# Patient Record
Sex: Female | Born: 1945
Health system: Southern US, Community
[De-identification: ages and names within clinical notes are randomized; demographics above are authoritative.]

## PROBLEM LIST (undated history)

## (undated) DIAGNOSIS — I639 Cerebral infarction, unspecified: Secondary | ICD-10-CM

## (undated) DIAGNOSIS — G4733 Obstructive sleep apnea (adult) (pediatric): Secondary | ICD-10-CM

## (undated) DIAGNOSIS — F32A Depression, unspecified: Secondary | ICD-10-CM

## (undated) DIAGNOSIS — M1990 Unspecified osteoarthritis, unspecified site: Secondary | ICD-10-CM

## (undated) DIAGNOSIS — I509 Heart failure, unspecified: Secondary | ICD-10-CM

## (undated) DIAGNOSIS — I10 Essential (primary) hypertension: Secondary | ICD-10-CM

## (undated) DIAGNOSIS — Z8719 Personal history of other diseases of the digestive system: Secondary | ICD-10-CM

## (undated) DIAGNOSIS — R51 Headache: Secondary | ICD-10-CM

## (undated) DIAGNOSIS — C801 Malignant (primary) neoplasm, unspecified: Secondary | ICD-10-CM

## (undated) DIAGNOSIS — N189 Chronic kidney disease, unspecified: Secondary | ICD-10-CM

## (undated) DIAGNOSIS — E039 Hypothyroidism, unspecified: Secondary | ICD-10-CM

## (undated) DIAGNOSIS — M549 Dorsalgia, unspecified: Secondary | ICD-10-CM

## (undated) DIAGNOSIS — I219 Acute myocardial infarction, unspecified: Secondary | ICD-10-CM

## (undated) DIAGNOSIS — E119 Type 2 diabetes mellitus without complications: Secondary | ICD-10-CM

## (undated) DIAGNOSIS — F419 Anxiety disorder, unspecified: Secondary | ICD-10-CM

## (undated) DIAGNOSIS — K219 Gastro-esophageal reflux disease without esophagitis: Secondary | ICD-10-CM

## (undated) DIAGNOSIS — I2510 Atherosclerotic heart disease of native coronary artery without angina pectoris: Secondary | ICD-10-CM

## (undated) DIAGNOSIS — F329 Major depressive disorder, single episode, unspecified: Secondary | ICD-10-CM

## (undated) HISTORY — DX: Unspecified osteoarthritis, unspecified site: M19.90

## (undated) HISTORY — DX: Atherosclerotic heart disease of native coronary artery without angina pectoris: I25.10

## (undated) HISTORY — PX: UVULOPALATOPHARYNGOPLASTY: SHX827

## (undated) HISTORY — PX: CARPAL TUNNEL RELEASE: SHX101

## (undated) HISTORY — PX: JOINT REPLACEMENT: SHX530

## (undated) HISTORY — PX: REPLACEMENT TOTAL KNEE: SUR1224

## (undated) HISTORY — PX: CERVICAL DISC SURGERY: SHX588

## (undated) HISTORY — DX: Dorsalgia, unspecified: M54.9

## (undated) HISTORY — PX: SALPINGOOPHORECTOMY: SHX82

## (undated) HISTORY — DX: Obstructive sleep apnea (adult) (pediatric): G47.33

## (undated) HISTORY — PX: TONSILLECTOMY: SUR1361

## (undated) HISTORY — DX: Essential (primary) hypertension: I10

## (undated) HISTORY — DX: Hypothyroidism, unspecified: E03.9

## (undated) HISTORY — PX: ABDOMINAL HYSTERECTOMY: SHX81

## (undated) HISTORY — DX: Type 2 diabetes mellitus without complications: E11.9

---

## 1997-09-15 ENCOUNTER — Other Ambulatory Visit: Admission: RE | Admit: 1997-09-15 | Discharge: 1997-09-15 | Payer: Self-pay

## 1998-06-23 ENCOUNTER — Other Ambulatory Visit: Admission: RE | Admit: 1998-06-23 | Discharge: 1998-06-23 | Payer: Self-pay | Admitting: Gynecology

## 2000-05-02 ENCOUNTER — Encounter: Payer: Self-pay | Admitting: Specialist

## 2000-05-08 ENCOUNTER — Inpatient Hospital Stay (HOSPITAL_COMMUNITY): Admission: EM | Admit: 2000-05-08 | Discharge: 2000-05-09 | Payer: Self-pay | Admitting: Specialist

## 2000-07-24 ENCOUNTER — Other Ambulatory Visit: Admission: RE | Admit: 2000-07-24 | Discharge: 2000-07-24 | Payer: Self-pay | Admitting: Gynecology

## 2000-07-31 ENCOUNTER — Encounter: Payer: Self-pay | Admitting: Gynecology

## 2000-07-31 ENCOUNTER — Encounter: Admission: RE | Admit: 2000-07-31 | Discharge: 2000-07-31 | Payer: Self-pay | Admitting: Gynecology

## 2000-08-02 ENCOUNTER — Encounter: Admission: RE | Admit: 2000-08-02 | Discharge: 2000-08-02 | Payer: Self-pay | Admitting: Gynecology

## 2000-08-02 ENCOUNTER — Encounter: Payer: Self-pay | Admitting: Gynecology

## 2001-08-13 ENCOUNTER — Encounter: Admission: RE | Admit: 2001-08-13 | Discharge: 2001-08-13 | Payer: Self-pay | Admitting: Gynecology

## 2001-08-13 ENCOUNTER — Encounter: Payer: Self-pay | Admitting: Gynecology

## 2001-08-14 ENCOUNTER — Other Ambulatory Visit: Admission: RE | Admit: 2001-08-14 | Discharge: 2001-08-14 | Payer: Self-pay | Admitting: Gynecology

## 2001-10-16 ENCOUNTER — Inpatient Hospital Stay (HOSPITAL_COMMUNITY): Admission: AD | Admit: 2001-10-16 | Discharge: 2001-10-19 | Payer: Self-pay | Admitting: Cardiology

## 2001-10-17 ENCOUNTER — Encounter: Payer: Self-pay | Admitting: Cardiology

## 2002-05-06 ENCOUNTER — Ambulatory Visit (HOSPITAL_COMMUNITY): Admission: RE | Admit: 2002-05-06 | Discharge: 2002-05-06 | Payer: Self-pay | Admitting: Pulmonary Disease

## 2002-08-20 ENCOUNTER — Other Ambulatory Visit: Admission: RE | Admit: 2002-08-20 | Discharge: 2002-08-20 | Payer: Self-pay | Admitting: Gynecology

## 2003-05-07 ENCOUNTER — Inpatient Hospital Stay (HOSPITAL_COMMUNITY): Admission: RE | Admit: 2003-05-07 | Discharge: 2003-05-09 | Payer: Self-pay | Admitting: Neurosurgery

## 2003-08-30 ENCOUNTER — Other Ambulatory Visit: Admission: RE | Admit: 2003-08-30 | Discharge: 2003-08-30 | Payer: Self-pay | Admitting: Gynecology

## 2003-09-24 ENCOUNTER — Inpatient Hospital Stay (HOSPITAL_COMMUNITY): Admission: RE | Admit: 2003-09-24 | Discharge: 2003-09-29 | Payer: Self-pay | Admitting: Specialist

## 2003-11-11 ENCOUNTER — Ambulatory Visit (HOSPITAL_COMMUNITY): Admission: RE | Admit: 2003-11-11 | Discharge: 2003-11-11 | Payer: Self-pay | Admitting: Cardiology

## 2003-12-02 ENCOUNTER — Ambulatory Visit (HOSPITAL_COMMUNITY): Admission: RE | Admit: 2003-12-02 | Discharge: 2003-12-02 | Payer: Self-pay | Admitting: Cardiology

## 2004-01-25 ENCOUNTER — Observation Stay (HOSPITAL_COMMUNITY): Admission: RE | Admit: 2004-01-25 | Discharge: 2004-01-26 | Payer: Self-pay | Admitting: Neurosurgery

## 2004-02-11 ENCOUNTER — Observation Stay (HOSPITAL_COMMUNITY): Admission: RE | Admit: 2004-02-11 | Discharge: 2004-02-12 | Payer: Self-pay | Admitting: Neurosurgery

## 2004-04-05 ENCOUNTER — Encounter (HOSPITAL_COMMUNITY): Admission: RE | Admit: 2004-04-05 | Discharge: 2004-07-04 | Payer: Self-pay | Admitting: *Deleted

## 2004-04-18 ENCOUNTER — Ambulatory Visit (HOSPITAL_COMMUNITY): Admission: RE | Admit: 2004-04-18 | Discharge: 2004-04-18 | Payer: Self-pay | Admitting: *Deleted

## 2004-04-18 ENCOUNTER — Encounter (INDEPENDENT_AMBULATORY_CARE_PROVIDER_SITE_OTHER): Payer: Self-pay | Admitting: Specialist

## 2004-04-18 ENCOUNTER — Ambulatory Visit (HOSPITAL_BASED_OUTPATIENT_CLINIC_OR_DEPARTMENT_OTHER): Admission: RE | Admit: 2004-04-18 | Discharge: 2004-04-18 | Payer: Self-pay | Admitting: *Deleted

## 2004-05-11 ENCOUNTER — Ambulatory Visit: Payer: Self-pay | Admitting: Internal Medicine

## 2004-05-24 ENCOUNTER — Inpatient Hospital Stay (HOSPITAL_COMMUNITY): Admission: RE | Admit: 2004-05-24 | Discharge: 2004-05-28 | Payer: Self-pay | Admitting: Specialist

## 2004-07-19 ENCOUNTER — Ambulatory Visit: Payer: Self-pay

## 2004-07-25 ENCOUNTER — Ambulatory Visit: Payer: Self-pay | Admitting: Cardiology

## 2004-08-08 ENCOUNTER — Emergency Department (HOSPITAL_COMMUNITY): Admission: EM | Admit: 2004-08-08 | Discharge: 2004-08-08 | Payer: Self-pay | Admitting: Emergency Medicine

## 2004-08-10 ENCOUNTER — Ambulatory Visit (HOSPITAL_COMMUNITY): Admission: RE | Admit: 2004-08-10 | Discharge: 2004-08-10 | Payer: Self-pay | Admitting: Gastroenterology

## 2004-08-30 ENCOUNTER — Other Ambulatory Visit: Admission: RE | Admit: 2004-08-30 | Discharge: 2004-08-30 | Payer: Self-pay | Admitting: Gynecology

## 2004-09-21 ENCOUNTER — Encounter: Admission: RE | Admit: 2004-09-21 | Discharge: 2004-09-21 | Payer: Self-pay | Admitting: Neurology

## 2004-09-22 ENCOUNTER — Ambulatory Visit (HOSPITAL_COMMUNITY): Admission: RE | Admit: 2004-09-22 | Discharge: 2004-09-23 | Payer: Self-pay | Admitting: Neurosurgery

## 2004-10-24 ENCOUNTER — Ambulatory Visit: Payer: Self-pay

## 2004-10-26 ENCOUNTER — Ambulatory Visit: Payer: Self-pay | Admitting: Cardiology

## 2004-11-09 ENCOUNTER — Ambulatory Visit: Payer: Self-pay | Admitting: Cardiology

## 2005-03-06 ENCOUNTER — Ambulatory Visit: Payer: Self-pay

## 2005-08-31 ENCOUNTER — Other Ambulatory Visit: Admission: RE | Admit: 2005-08-31 | Discharge: 2005-08-31 | Payer: Self-pay | Admitting: Gynecology

## 2005-09-03 ENCOUNTER — Ambulatory Visit: Payer: Self-pay | Admitting: Cardiology

## 2005-11-02 ENCOUNTER — Encounter (INDEPENDENT_AMBULATORY_CARE_PROVIDER_SITE_OTHER): Payer: Self-pay | Admitting: Specialist

## 2005-11-02 ENCOUNTER — Ambulatory Visit (HOSPITAL_COMMUNITY): Admission: RE | Admit: 2005-11-02 | Discharge: 2005-11-02 | Payer: Self-pay | Admitting: Gastroenterology

## 2005-12-20 ENCOUNTER — Ambulatory Visit: Payer: Self-pay | Admitting: Cardiology

## 2005-12-25 ENCOUNTER — Ambulatory Visit: Payer: Self-pay | Admitting: Cardiovascular Disease

## 2006-01-14 ENCOUNTER — Ambulatory Visit: Payer: Self-pay | Admitting: Cardiovascular Disease

## 2006-03-18 ENCOUNTER — Ambulatory Visit: Payer: Self-pay | Admitting: Cardiology

## 2006-04-12 ENCOUNTER — Ambulatory Visit: Payer: Self-pay | Admitting: Pulmonary Disease

## 2006-04-22 ENCOUNTER — Ambulatory Visit (HOSPITAL_BASED_OUTPATIENT_CLINIC_OR_DEPARTMENT_OTHER): Admission: RE | Admit: 2006-04-22 | Discharge: 2006-04-22 | Payer: Self-pay | Admitting: Pulmonary Disease

## 2006-05-09 ENCOUNTER — Ambulatory Visit: Payer: Self-pay | Admitting: Pulmonary Disease

## 2006-05-15 ENCOUNTER — Ambulatory Visit: Payer: Self-pay | Admitting: Pulmonary Disease

## 2006-05-28 ENCOUNTER — Ambulatory Visit: Payer: Self-pay | Admitting: Cardiology

## 2006-05-28 LAB — CONVERTED CEMR LAB
BUN: 12 mg/dL (ref 6–23)
CO2: 25 meq/L (ref 19–32)
Calcium: 9 mg/dL (ref 8.4–10.5)
Chloride: 106 meq/L (ref 96–112)
Creatinine, Ser: 0.8 mg/dL (ref 0.4–1.2)
Glucose, Bld: 237 mg/dL — ABNORMAL HIGH (ref 70–99)

## 2006-06-04 ENCOUNTER — Ambulatory Visit: Payer: Self-pay | Admitting: Cardiology

## 2006-06-04 LAB — CONVERTED CEMR LAB
BUN: 14 mg/dL (ref 6–23)
CO2: 27 meq/L (ref 19–32)
Calcium: 8.8 mg/dL (ref 8.4–10.5)
Chloride: 109 meq/L (ref 96–112)
GFR calc Af Amer: 93 mL/min
Glucose, Bld: 267 mg/dL — ABNORMAL HIGH (ref 70–99)

## 2006-06-19 ENCOUNTER — Ambulatory Visit: Payer: Self-pay | Admitting: Pulmonary Disease

## 2006-08-06 ENCOUNTER — Ambulatory Visit: Payer: Self-pay | Admitting: Cardiology

## 2006-08-15 ENCOUNTER — Observation Stay (HOSPITAL_COMMUNITY): Admission: RE | Admit: 2006-08-15 | Discharge: 2006-08-16 | Payer: Self-pay | Admitting: Otolaryngology

## 2006-08-15 ENCOUNTER — Encounter (INDEPENDENT_AMBULATORY_CARE_PROVIDER_SITE_OTHER): Payer: Self-pay | Admitting: *Deleted

## 2006-09-17 ENCOUNTER — Ambulatory Visit: Payer: Self-pay | Admitting: Pulmonary Disease

## 2007-01-23 ENCOUNTER — Ambulatory Visit: Payer: Self-pay | Admitting: Cardiology

## 2007-01-23 LAB — CONVERTED CEMR LAB
Calcium: 8.8 mg/dL (ref 8.4–10.5)
Chloride: 105 meq/L (ref 96–112)
Creatinine, Ser: 0.8 mg/dL (ref 0.4–1.2)
GFR calc non Af Amer: 77 mL/min
Glucose, Bld: 122 mg/dL — ABNORMAL HIGH (ref 70–99)
Pro B Natriuretic peptide (BNP): 71 pg/mL (ref 0.0–100.0)
Sodium: 142 meq/L (ref 135–145)

## 2007-02-04 ENCOUNTER — Ambulatory Visit: Payer: Self-pay | Admitting: Pulmonary Disease

## 2007-03-06 DIAGNOSIS — E039 Hypothyroidism, unspecified: Secondary | ICD-10-CM | POA: Insufficient documentation

## 2007-03-06 DIAGNOSIS — I2510 Atherosclerotic heart disease of native coronary artery without angina pectoris: Secondary | ICD-10-CM | POA: Insufficient documentation

## 2007-03-06 DIAGNOSIS — G4733 Obstructive sleep apnea (adult) (pediatric): Secondary | ICD-10-CM | POA: Insufficient documentation

## 2007-03-06 DIAGNOSIS — E669 Obesity, unspecified: Secondary | ICD-10-CM | POA: Insufficient documentation

## 2007-03-06 DIAGNOSIS — M549 Dorsalgia, unspecified: Secondary | ICD-10-CM | POA: Insufficient documentation

## 2007-03-06 DIAGNOSIS — J309 Allergic rhinitis, unspecified: Secondary | ICD-10-CM | POA: Insufficient documentation

## 2007-03-06 DIAGNOSIS — I10 Essential (primary) hypertension: Secondary | ICD-10-CM | POA: Insufficient documentation

## 2007-03-06 DIAGNOSIS — M159 Polyosteoarthritis, unspecified: Secondary | ICD-10-CM | POA: Insufficient documentation

## 2007-03-23 ENCOUNTER — Ambulatory Visit (HOSPITAL_BASED_OUTPATIENT_CLINIC_OR_DEPARTMENT_OTHER): Admission: RE | Admit: 2007-03-23 | Discharge: 2007-03-23 | Payer: Self-pay | Admitting: Otolaryngology

## 2007-03-30 ENCOUNTER — Ambulatory Visit: Payer: Self-pay | Admitting: Internal Medicine

## 2007-05-01 ENCOUNTER — Emergency Department (HOSPITAL_COMMUNITY): Admission: EM | Admit: 2007-05-01 | Discharge: 2007-05-01 | Payer: Self-pay | Admitting: Emergency Medicine

## 2007-07-17 ENCOUNTER — Inpatient Hospital Stay (HOSPITAL_COMMUNITY): Admission: EM | Admit: 2007-07-17 | Discharge: 2007-07-19 | Payer: Self-pay | Admitting: Emergency Medicine

## 2007-08-13 ENCOUNTER — Ambulatory Visit: Payer: Self-pay | Admitting: Cardiology

## 2007-09-03 ENCOUNTER — Other Ambulatory Visit: Admission: RE | Admit: 2007-09-03 | Discharge: 2007-09-03 | Payer: Self-pay | Admitting: Gynecology

## 2008-08-04 DIAGNOSIS — E119 Type 2 diabetes mellitus without complications: Secondary | ICD-10-CM | POA: Insufficient documentation

## 2008-08-05 ENCOUNTER — Encounter: Payer: Self-pay | Admitting: Cardiology

## 2008-08-05 ENCOUNTER — Ambulatory Visit: Payer: Self-pay | Admitting: Cardiology

## 2008-09-06 ENCOUNTER — Ambulatory Visit: Payer: Self-pay | Admitting: Women's Health

## 2009-03-12 ENCOUNTER — Emergency Department (HOSPITAL_COMMUNITY): Admission: EM | Admit: 2009-03-12 | Discharge: 2009-03-12 | Payer: Self-pay | Admitting: Emergency Medicine

## 2009-08-04 ENCOUNTER — Ambulatory Visit: Payer: Self-pay | Admitting: Cardiology

## 2009-09-12 ENCOUNTER — Ambulatory Visit: Payer: Self-pay | Admitting: Women's Health

## 2009-10-03 ENCOUNTER — Encounter: Admission: RE | Admit: 2009-10-03 | Discharge: 2009-10-03 | Payer: Self-pay | Admitting: Gastroenterology

## 2009-10-18 ENCOUNTER — Ambulatory Visit: Payer: Self-pay | Admitting: Hematology and Oncology

## 2009-10-21 LAB — URINALYSIS, MICROSCOPIC - CHCC
Bilirubin (Urine): NEGATIVE
Blood: NEGATIVE
Ketones: NEGATIVE mg/dL
RBC count: NEGATIVE (ref 0–2)
pH: 5 (ref 4.6–8.0)

## 2009-10-21 LAB — CBC WITH DIFFERENTIAL/PLATELET
Basophils Absolute: 0 10*3/uL (ref 0.0–0.1)
EOS%: 2.4 % (ref 0.0–7.0)
HCT: 34.2 % — ABNORMAL LOW (ref 34.8–46.6)
HGB: 11.2 g/dL — ABNORMAL LOW (ref 11.6–15.9)
MCH: 27.6 pg (ref 25.1–34.0)
MCV: 84 fL (ref 79.5–101.0)
MONO%: 10.6 % (ref 0.0–14.0)
NEUT%: 59.9 % (ref 38.4–76.8)

## 2009-10-25 LAB — COMPREHENSIVE METABOLIC PANEL
AST: 20 U/L (ref 0–37)
BUN: 18 mg/dL (ref 6–23)
CO2: 21 mEq/L (ref 19–32)
Calcium: 9.6 mg/dL (ref 8.4–10.5)
Chloride: 101 mEq/L (ref 96–112)
Creatinine, Ser: 1.22 mg/dL — ABNORMAL HIGH (ref 0.40–1.20)

## 2009-10-25 LAB — PROTEIN ELECTROPHORESIS, SERUM, WITH REFLEX
Albumin ELP: 52.5 % — ABNORMAL LOW (ref 55.8–66.1)
Alpha-1-Globulin: 4.3 % (ref 2.9–4.9)
Alpha-2-Globulin: 13.4 % — ABNORMAL HIGH (ref 7.1–11.8)
Beta Globulin: 7 % (ref 4.7–7.2)
Total Protein, Serum Electrophoresis: 7.3 g/dL (ref 6.0–8.3)

## 2009-10-25 LAB — IRON AND TIBC
%SAT: 11 % — ABNORMAL LOW (ref 20–55)
Iron: 37 ug/dL — ABNORMAL LOW (ref 42–145)
TIBC: 347 ug/dL (ref 250–470)
UIBC: 310 ug/dL

## 2009-10-27 ENCOUNTER — Ambulatory Visit: Admission: RE | Admit: 2009-10-27 | Discharge: 2009-10-27 | Payer: Self-pay | Admitting: Gynecologic Oncology

## 2009-11-29 ENCOUNTER — Inpatient Hospital Stay (HOSPITAL_COMMUNITY): Admission: RE | Admit: 2009-11-29 | Discharge: 2009-12-02 | Payer: Self-pay | Admitting: Obstetrics & Gynecology

## 2009-11-29 ENCOUNTER — Encounter: Payer: Self-pay | Admitting: Obstetrics & Gynecology

## 2009-11-29 ENCOUNTER — Encounter: Payer: Self-pay | Admitting: Cardiology

## 2009-11-29 ENCOUNTER — Ambulatory Visit: Payer: Self-pay | Admitting: Cardiovascular Disease

## 2009-11-30 ENCOUNTER — Encounter: Payer: Self-pay | Admitting: Obstetrics & Gynecology

## 2009-12-20 ENCOUNTER — Encounter: Payer: Self-pay | Admitting: Cardiology

## 2009-12-23 ENCOUNTER — Encounter (INDEPENDENT_AMBULATORY_CARE_PROVIDER_SITE_OTHER): Payer: Self-pay | Admitting: *Deleted

## 2009-12-23 ENCOUNTER — Ambulatory Visit: Payer: Self-pay | Admitting: Cardiology

## 2009-12-23 DIAGNOSIS — I498 Other specified cardiac arrhythmias: Secondary | ICD-10-CM | POA: Insufficient documentation

## 2009-12-23 LAB — CONVERTED CEMR LAB
BUN: 23 mg/dL (ref 6–23)
Calcium: 9.3 mg/dL (ref 8.4–10.5)
Creatinine, Ser: 1.5 mg/dL — ABNORMAL HIGH (ref 0.4–1.2)
Eosinophils Relative: 2.6 % (ref 0.0–5.0)
GFR calc non Af Amer: 36.58 mL/min (ref >60)
INR: 1 (ref 0.8–1.0)
Lymphocytes Relative: 25.5 % (ref 12.0–46.0)
Monocytes Absolute: 0.5 10*3/uL (ref 0.1–1.0)
Monocytes Relative: 5.9 % (ref 3.0–12.0)
Neutrophils Relative %: 65.2 % (ref 43.0–77.0)
Platelets: 226 10*3/uL (ref 150.0–400.0)
Prothrombin Time: 10.9 s (ref 9.7–11.8)
WBC: 9.2 10*3/uL (ref 4.5–10.5)
aPTT: 26.9 s (ref 21.7–28.8)

## 2009-12-24 ENCOUNTER — Encounter: Payer: Self-pay | Admitting: Cardiology

## 2009-12-26 ENCOUNTER — Ambulatory Visit: Payer: Self-pay | Admitting: Hematology and Oncology

## 2009-12-26 LAB — CONVERTED CEMR LAB
BUN: 27 mg/dL — ABNORMAL HIGH (ref 6–23)
Potassium: 4.5 meq/L (ref 3.5–5.3)

## 2009-12-27 ENCOUNTER — Ambulatory Visit: Payer: Self-pay | Admitting: Cardiology

## 2009-12-27 ENCOUNTER — Inpatient Hospital Stay (HOSPITAL_BASED_OUTPATIENT_CLINIC_OR_DEPARTMENT_OTHER): Admission: RE | Admit: 2009-12-27 | Discharge: 2009-12-27 | Payer: Self-pay | Admitting: Cardiology

## 2009-12-28 LAB — CBC WITH DIFFERENTIAL/PLATELET
Eosinophils Absolute: 0.4 10*3/uL (ref 0.0–0.5)
HGB: 11.1 g/dL — ABNORMAL LOW (ref 11.6–15.9)
LYMPH%: 31.5 % (ref 14.0–49.7)
MONO#: 0.7 10*3/uL (ref 0.1–0.9)
NEUT#: 3.6 10*3/uL (ref 1.5–6.5)
Platelets: 199 10*3/uL (ref 145–400)
RBC: 3.84 10*6/uL (ref 3.70–5.45)
RDW: 16 % — ABNORMAL HIGH (ref 11.2–14.5)
WBC: 6.9 10*3/uL (ref 3.9–10.3)

## 2009-12-28 LAB — COMPREHENSIVE METABOLIC PANEL
Albumin: 3.8 g/dL (ref 3.5–5.2)
CO2: 25 mEq/L (ref 19–32)
Glucose, Bld: 130 mg/dL — ABNORMAL HIGH (ref 70–99)
Potassium: 4.9 mEq/L (ref 3.5–5.3)
Sodium: 143 mEq/L (ref 135–145)
Total Protein: 6.4 g/dL (ref 6.0–8.3)

## 2009-12-28 LAB — IRON AND TIBC
Iron: 53 ug/dL (ref 42–145)
UIBC: 184 ug/dL

## 2010-01-08 ENCOUNTER — Emergency Department (HOSPITAL_COMMUNITY): Admission: EM | Admit: 2010-01-08 | Discharge: 2010-01-08 | Payer: Self-pay | Admitting: Emergency Medicine

## 2010-01-19 ENCOUNTER — Encounter: Payer: Self-pay | Admitting: Cardiology

## 2010-01-24 ENCOUNTER — Ambulatory Visit: Payer: Self-pay | Admitting: Cardiology

## 2010-01-24 DIAGNOSIS — R079 Chest pain, unspecified: Secondary | ICD-10-CM | POA: Insufficient documentation

## 2010-03-11 ENCOUNTER — Emergency Department (HOSPITAL_COMMUNITY): Admission: EM | Admit: 2010-03-11 | Discharge: 2010-03-11 | Payer: Self-pay | Admitting: Emergency Medicine

## 2010-05-30 NOTE — Cardiovascular Report (Signed)
Summary: Pre Cath Orders   Pre Cath Orders   Imported By: Roderic Ovens 01/10/2010 15:15:40  _____________________________________________________________________  External Attachment:    Type:   Image     Comment:   External Document

## 2010-05-30 NOTE — Consult Note (Signed)
Summary: [facility]   Imported By: Roderic Ovens 12/07/2009 16:06:21  _____________________________________________________________________  External Attachment:    Type:   Image     Comment:   External Document

## 2010-05-30 NOTE — Assessment & Plan Note (Signed)
Summary: eph      Allergies Added:   Visit Type:  Follow-up Primary Jakeb Lamping:  Laurann Montana, MD  CC:  chest pain.  History of Present Illness: The patient presents for followup of chest discomfort. She had a cardiac catheterization done recently that demonstrated a stable 40% LAD lesion and no disease elsewhere. The allergy of her chest discomfort was thus confirmed not the cardiac. Since that time she said none of the burning discomfort that she was having with activities. She had no problems with the catheterization and denies any bleeding or bruising. She has had no new shortness of breath, PND or orthopnea. She has had no chest pressure, neck or arm discomfort.  Current Medications (verified): 1)  Simvastatin 80 Mg  Tabs (Simvastatin) .... 1/2 By Mouth Daily 2)  Bayer Low Strength 81 Mg  Tbec (Aspirin) .... Take 1 Tablet By Mouth Once A Day 3)  Furosemide 40 Mg  Tabs (Furosemide) .... Take 1 Tablet By Mouth Once A Day 4)  Mobic 7.5 Mg  Tabs (Meloxicam) .... Take 1 Tablet By Mouth Once A Day 5)  Cozaar 50 Mg Tabs (Losartan Potassium) .... Take One Tab By Mouth Once Daily 6)  Clonidine Hcl 0.2 Mg Tabs (Clonidine Hcl) .Marland Kitchen.. 1 By Mouth Two Times A Day 7)  Metoprolol Tartrate 50 Mg  Tabs (Metoprolol Tartrate) .... Take 1 Tablet By Mouth Two Times A Day As Needed 8)  Synthroid 88 Mcg Tabs (Levothyroxine Sodium) .Marland Kitchen.. 1 By Mouth Daily 9)  Glipizide 10 Mg Xr24h-Tab (Glipizide) .Marland Kitchen.. 1 By Mouth Once Daily 10)  Spironolactone 25 Mg Tabs (Spironolactone) .... Take One Tablet By Mouth Daily 11)  Glucophage 1000 Mg Tabs (Metformin Hcl) .Marland Kitchen.. 1 By Mouth Two Times A Day 12)  Celexa 40 Mg Tabs (Citalopram Hydrobromide) .Marland Kitchen.. 1 By Mouth Daily 13)  Alprazolam 1 Mg Tabs (Alprazolam) .... As Needed 14)  Nu-Iron 150 Mg Caps (Polysaccharide Iron Complex) .Marland Kitchen.. 1  By Mouth Daily 15)  Ropinirole Hcl 0.25 Mg Tabs (Ropinirole Hcl) .Marland Kitchen.. 1 By Mouth Daily 16)  Omeprazole 20 Mg Cpdr (Omeprazole) .Marland Kitchen.. 1 By Mouth  Daily  Allergies (verified): 1)  ! Penicillin 2)  ! Demerol 3)  ! Codeine 4)  ! Morphine  Past History:  Past Medical History: Last updated: 08/04/2009 CORONARY ARTERY DISEASE (Nonobstructive coronary artery disease 40% LAD stenosis) (ICD-414.00) HYPERTENSION, BENIGN (ICD-401.1) OBSTRUCTIVE SLEEP APNEA (ICD-327.23) (no CPAP) OBESITY (ICD-278.00) DM (ICD-250.00) HYPOTHYROIDISM (ICD-244.9) Hx of ALLERGIC RHINITIS (ICD-477.9) BACK PAIN, CHRONIC (ICD-724.5) DEGENERATIVE JOINT DISEASE, GENERALIZED (ICD-715.00)  Vital Signs:  Patient profile:   65 year old female Height:      60 inches Weight:      188 pounds BMI:     36.85 Pulse rate:   53 / minute Resp:     18 per minute BP sitting:   134 / 76  (right arm)  Vitals Entered By: Marrion Coy, CNA (January 24, 2010 9:31 AM)  Physical Exam  General:  Well developed, well nourished, in no acute distress. Head:  normocephalic and atraumatic Neck:  Neck supple, no JVD. No masses, thyromegaly or abnormal cervical nodes. Chest Wall:  no deformities or breast masses noted Lungs:  Clear bilaterally to auscultation and percussion. Heart:  Non-displaced PMI, chest non-tender; regular rate and rhythm, S1, S2 without murmurs, rubs or gallops. Carotid upstroke normal, no bruit. Normal abdominal aortic size, no bruits. Femorals normal pulses, no bruits. Pedals normal pulses. No edema, no varicosities. Abdomen:  Bowel sounds positive; abdomen  soft and non-tender without masses, organomegaly, or hernias noted. No hepatosplenomegaly. Msk:  Back normal, normal gait. Muscle strength and tone normal. Extremities:  No clubbing or cyanosis. Neurologic:  Alert and oriented x 3. Skin:  Intact without lesions or rashes. Cervical Nodes:  no significant adenopathy Axillary Nodes:  no significant adenopathy Inguinal Nodes:  no significant adenopathy Psych:  Normal affect.   Impression & Recommendations:  Problem # 1:  CHEST PAIN  (ICD-786.50) The patient has had no further chest discomfort. The results are as above. There is no clear cardiac etiology. If she has recurrent discomfort she will follow with her primary physician.  Problem # 2:  BRADYCARDIA (ICD-427.89) The patient has had no symptomatic bradycardia arrhythmia. No further testing is suggested.

## 2010-05-30 NOTE — Letter (Signed)
Summary: Cardiac Catheterization Instructions- [facility]  [facility], Main Office  [address]   [place], [place] [address]   Phone: [phone]  Fax: [phone]     12/23/2009 MRN: [id number]  [name] [address] [place], [place]  [address]  Dear Ms. SHUPING,   You are scheduled for a Cardiac Catheterization on Tues 12/27/09 with Dr. Antoine Poche  Please arrive to the 1st floor of the [facility] V[facility] Center at [facility] at 11:30 am on the day of your procedure. Please do not arrive before 6:30 a.m. Call the Heart and Vascular Center at [phone] if you are unable to make your appointmnet. The Code to get into the parking garage under the building is 0002. Take the elevators to the 1st floor. You must have someone to drive you home. Someone must be with you for the first 24 hours after you arrive home. Please wear clothes that are easy to get on and off and wear slip-on shoes. Do not eat or drink after midnight except water with your medications that morning. Bring all your medications and current insurance cards with you.  _x__ DO NOT take these medications before your procedure: 1) hold glipizide the morning of procedure, 2) hold glucophage the morning of procedure and 48 hours after your procedure.   _x_ Make sure you take your aspirin.  _x__ You may take ALL of your other medications with water that morning. ________________________________________________________________________________________________________________________________  ___ DO NOT take ANY medications before your procedure.  ___ Pre-med instructions:  ________________________________________________________________________________________________________________________________  The usual length of stay after your procedure is 2 to 3 hours. This can vary.  If you have any questions, please call the office at the number listed above.   Sherri Rad, RN, BSN

## 2010-05-30 NOTE — Miscellaneous (Signed)
  Clinical Lists Changes  Observations: Added new observation of ECHOINTERP: Left ventricle: Difficult to judge EF due to poor image qualtiy.   Abnormal septal motion and normal to low normal EF likely The cavity   size was normal. Systolic function was normal. Wall motion was   normal; there were no regional wall motion abnormalities. (11/30/2009 10:09)      Echocardiogram  Procedure date:  11/30/2009  Findings:      Left ventricle: Difficult to judge EF due to poor image qualtiy.   Abnormal septal motion and normal to low normal EF likely The cavity   size was normal. Systolic function was normal. Wall motion was   normal; there were no regional wall motion abnormalities.

## 2010-05-30 NOTE — Assessment & Plan Note (Signed)
Summary: eph  Medications Added SIMVASTATIN 80 MG  TABS (SIMVASTATIN) 1/2 by mouth daily COZAAR 50 MG TABS (LOSARTAN POTASSIUM) take one tab by mouth once daily CLONIDINE HCL 0.2 MG TABS (CLONIDINE HCL) 1 by mouth two times a day SYNTHROID 88 MCG TABS (LEVOTHYROXINE SODIUM) 1 by mouth daily CELEXA 40 MG TABS (CITALOPRAM HYDROBROMIDE) 1 by mouth daily NU-IRON 150 MG CAPS (POLYSACCHARIDE IRON COMPLEX) 1  by mouth daily ROPINIROLE HCL 0.25 MG TABS (ROPINIROLE HCL) 1 by mouth daily OMEPRAZOLE 20 MG CPDR (OMEPRAZOLE) 1 by mouth daily      Allergies Added:   Visit Type:  Follow-up Primary Provider:  Laurann Montana, MD  CC:  Chest Pain and CAD.  History of Present Illness: the patient presents for followup of chest discomfort. She has a history of nonobstructive coronary disease with catheterization in 2004. However, she was hospitalized for salpingo-oophorectomy earlier this month and developed pulmonary edema postoperatively. She was found to have an enzyme elevation with non-Q wave myocardial infarction. She was managed medically and it was felt that possibly a hypertensive response contributed. She did have an echocardiogram which demonstrated a normal ejection fraction. However, since going home she has had continued chest tightness with activities. She does some household chores and goes grocery shopping and has noticed a mild chest discomfort with this that was not there previously. She can stop what she is doing and will ease up. She may get slightly nauseated or diaphoretic. She does not have any new shortness of breath, PND or orthopnea. She has some chronic dyspnea. She's had no new palpitations, presyncope or syncope. She doesn't report any resting symptoms.  Current Medications (verified): 1)  Simvastatin 80 Mg  Tabs (Simvastatin) .... 1/2 By Mouth Daily 2)  Bayer Low Strength 81 Mg  Tbec (Aspirin) .... Take 1 Tablet By Mouth Once A Day 3)  Furosemide 40 Mg  Tabs (Furosemide) ....  Take 1 Tablet By Mouth Once A Day 4)  Mobic 7.5 Mg  Tabs (Meloxicam) .... Take 1 Tablet By Mouth Once A Day 5)  Cozaar 100 Mg  Tabs (Losartan Potassium) .... Take 1 Tablet By Mouth Once A Day 6)  Clonidine Hcl 0.2 Mg Tabs (Clonidine Hcl) .Marland Kitchen.. 1 By Mouth Two Times A Day 7)  Metoprolol Tartrate 50 Mg  Tabs (Metoprolol Tartrate) .... Take 1 Tablet By Mouth Two Times A Day As Needed 8)  Synthroid 88 Mcg Tabs (Levothyroxine Sodium) .Marland Kitchen.. 1 By Mouth Daily 9)  Glipizide 10 Mg Xr24h-Tab (Glipizide) .Marland Kitchen.. 1 By Mouth Once Daily 10)  Spironolactone 25 Mg Tabs (Spironolactone) .... Take One Tablet By Mouth Daily 11)  Glucophage 1000 Mg Tabs (Metformin Hcl) .Marland Kitchen.. 1 By Mouth Two Times A Day 12)  Celexa 40 Mg Tabs (Citalopram Hydrobromide) .Marland Kitchen.. 1 By Mouth Daily 13)  Alprazolam 1 Mg Tabs (Alprazolam) .... As Needed 14)  Nu-Iron 150 Mg Caps (Polysaccharide Iron Complex) .Marland Kitchen.. 1  By Mouth Daily 15)  Ropinirole Hcl 0.25 Mg Tabs (Ropinirole Hcl) .Marland Kitchen.. 1 By Mouth Daily 16)  Omeprazole 20 Mg Cpdr (Omeprazole) .Marland Kitchen.. 1 By Mouth Daily  Allergies (verified): 1)  ! Penicillin 2)  ! Demerol 3)  ! Codeine 4)  ! Morphine  Past History:  Past Medical History: Reviewed history from 08/04/2009 and no changes required. CORONARY ARTERY DISEASE (Nonobstructive coronary artery disease 40% LAD stenosis) (ICD-414.00) HYPERTENSION, BENIGN (ICD-401.1) OBSTRUCTIVE SLEEP APNEA (ICD-327.23) (no CPAP) OBESITY (ICD-278.00) DM (ICD-250.00) HYPOTHYROIDISM (ICD-244.9) Hx of ALLERGIC RHINITIS (ICD-477.9) BACK PAIN, CHRONIC (ICD-724.5) DEGENERATIVE JOINT  DISEASE, GENERALIZED (ICD-715.00)  Past Surgical History: Total anterior cervical diskectomies Cervical fusion Right carpal tunnel release Left carpal tunnel release Uvulopalatopharyngoplasty for sleep apnea Bilateral salpingo-oophorectomy laparoscopic  Family History: Reviewed history from 08/04/2008 and no changes required. Family History of Coronary Artery Disease: Marland KitchenMarland KitchenFather  and brother  Mother: Breast Ca Siblings: Sister breast Ca  Social History: Reviewed history from 08/04/2008 and no changes required. Retired  Married  Tobacco Use - No.  Alcohol Use - no  Review of Systems       As stated in the HPI and negative for all other systems.   Vital Signs:  Patient profile:   65 year old female Height:      60 inches Weight:      180 pounds BMI:     35.28 Pulse rate:   46 / minute Resp:     18 per minute BP sitting:   82 /   Vitals Entered By: Marrion Coy, CNA (December 23, 2009 11:59 AM)  Physical Exam  General:  Well developed, well nourished, in no acute distress. Head:  normocephalic and atraumatic Eyes:  PERRLA/EOM intact; conjunctiva and lids normal. Mouth:  Teeth, gums and palate normal. Oral mucosa normal. Neck:  Neck supple, no JVD. No masses, thyromegaly or abnormal cervical nodes. Chest Wall:  no deformities or breast masses noted Lungs:  Clear bilaterally to auscultation and percussion. Abdomen:  Bowel sounds positive; abdomen soft and non-tender without masses, organomegaly, or hernias noted. No hepatosplenomegaly, obese Msk:  Back normal, normal gait. Muscle strength and tone normal. Extremities:  No clubbing or cyanosis. Neurologic:  Alert and oriented x 3. Skin:  Intact without lesions or rashes. Cervical Nodes:  no significant adenopathy Inguinal Nodes:  no significant adenopathy Psych:  Normal affect.   Detailed Cardiovascular Exam  Neck    Carotids: Carotids full and equal bilaterally without bruits.      Neck Veins: Normal, no JVD.    Heart    Inspection: no deformities or lifts noted.      Palpation: normal PMI with no thrills palpable.      Auscultation: regular rate and rhythm, S1, S2 without murmurs, rubs, gallops, or clicks.    Vascular    Abdominal Aorta: no palpable masses, pulsations, or audible bruits.      Femoral Pulses: normal femoral pulses bilaterally.      Pedal Pulses: normal pedal pulses  bilaterally.      Radial Pulses: normal radial pulses bilaterally.      Peripheral Circulation: no clubbing, cyanosis, or edema noted with normal capillary refill.     Impression & Recommendations:  Problem # 1:  CORONARY ARTERY DISEASE (ICD-414.00)  The patient has continued chest discomfort with exertion after a non-Q-wave MI in the hospital. I think the pretest probability of obstructive coronary disease causing these symptoms is high. Therefore, cardiac catheterization is indicated. She understands the risks and benefits and agrees to proceed.  Orders: TLB-BMP (Basic Metabolic Panel-BMET) (80048-METABOL) TLB-CBC Platelet - w/Differential (85025-CBCD) TLB-PT (Protime) (85610-PTP) TLB-PTT (85730-PTTL) Cardiac Catheterization (Cardiac Cath)  Problem # 2:  HYPERTENSION, BENIGN (ICD-401.1) Her blood pressure is actually running low. I will cut her Cozaar to 50 mg daily.  Problem # 3:  BRADYCARDIA (ICD-427.89) The patient has bradycardia but no symptoms related to this. I will likely reduce her beta blocker but will defer until the above evaluation..  Patient Instructions: 1)  Your physician recommends that you have lab work today: bmet/cbc/ptt/pt (414.01;786.51) 2)  Decrease Cozaar to 50mg   once daily. 3)  Your physician has requested that you have a cardiac catheterization.  Cardiac catheterization is used to diagnose and/or treat various heart conditions. Doctors may recommend this procedure for a number of different reasons. The most common reason is to evaluate chest pain. Chest pain can be a symptom of coronary artery disease (CAD), and cardiac catheterization can show whether plaque is narrowing or blocking your heart's arteries. This procedure is also used to evaluate the valves, as well as measure the blood flow and oxygen levels in different parts of your heart.  For further information please visit [email or web address]  Please follow instruction sheet, as  given. Prescriptions: COZAAR 50 MG TABS (LOSARTAN POTASSIUM) take one tab by mouth once daily  #30 x 6   Entered by:   Sherri Rad, RN, BSN   Authorized by:   Rollene Rotunda, MD, [facility]   Signed by:   Sherri Rad, RN, BSN on 12/23/2009   Method used:   Electronically to        [facility] [address] [phone]* (retail)       [address]       [place], [place]  [address]       Ph: [phone] or [phone]       Fax: [phone]   RxID:   [id number]

## 2010-05-30 NOTE — Miscellaneous (Signed)
  Clinical Lists Changes  Observations: Added new observation of CARDCATHFIND: RESULTS:  Hemodynamics; AO 131/85.  Coronaries; the left main had separate ostia.  The LAD had a proximal 40% lesion unchanged from previous.  There was a large vessel wrapping the apex and was free of disease elsewhere.  There were small diagonals which were normal.  The circumflex in the AV groove was dominant.  It was normal throughout its course.  Mid obtuse marginal was large and normal.  The PDA was small and normal.  Posterolateral x2 was normal.  The right coronary artery is nondominant and normal.   The left ventricle was not injected.  I did not cross for pressures.   CONCLUSION:  Nonobstructive coronary plaque.   PLAN:  No further cardiovascular testing is suggested.  The patient will continue with risk reduction.  The etiology of her chest discomfort is unclear.  There does not appear to be a cardiac etiology.         (12/27/2009 11:24)      Cardiac Cath  Procedure date:  12/27/2009  Findings:      RESULTS:  Hemodynamics; AO 131/85.  Coronaries; the left main had separate ostia.  The LAD had a proximal 40% lesion unchanged from previous.  There was a large vessel wrapping the apex and was free of disease elsewhere.  There were small diagonals which were normal.  The circumflex in the AV groove was dominant.  It was normal throughout its course.  Mid obtuse marginal was large and normal.  The PDA was small and normal.  Posterolateral x2 was normal.  The right coronary artery is nondominant and normal.   The left ventricle was not injected.  I did not cross for pressures.   CONCLUSION:  Nonobstructive coronary plaque.   PLAN:  No further cardiovascular testing is suggested.  The patient will continue with risk reduction.  The etiology of her chest discomfort is unclear.  There does not appear to be a cardiac etiology.

## 2010-05-30 NOTE — Assessment & Plan Note (Signed)
Summary: 1 YR/DMP  Medications Added SYNTHROID 125 MCG TABS (LEVOTHYROXINE SODIUM) Take 1 tablet by mouth once a day      Allergies Added:   Visit Type:  Follow-up Primary Provider:  Laurann Montana, MD  CC:  HTN.  History of Present Illness: The patient presents for follow up while difficult to control blood pressure. Since I last saw her she has had no new complaints. She continues to get fatigue. She still has a lot of stress related to the adoption of her 2 grandchildren. She's not been describing new chest discomfort, neck or arm discomfort. She is not describing new shortness of breath, PND or orthopnea. She is having no new palpitations, presyncope or syncope. She think she is tolerating the medicines as listed despite the fatigue and some dry mouth.  Current Medications (verified): 1)  Simvastatin 80 Mg  Tabs (Simvastatin) .... Take 1 Tablet By Mouth Once A Day 2)  Bayer Low Strength 81 Mg  Tbec (Aspirin) .... Take 1 Tablet By Mouth Once A Day 3)  Furosemide 40 Mg  Tabs (Furosemide) .... Take 1 Tablet By Mouth Once A Day 4)  Mobic 7.5 Mg  Tabs (Meloxicam) .... Take 1 Tablet By Mouth Once A Day 5)  Cozaar 100 Mg  Tabs (Losartan Potassium) .... Take 1 Tablet By Mouth Once A Day 6)  Amlodipine Besylate 10 Mg  Tabs (Amlodipine Besylate) .... Take 1 Tablet By Mouth Once A Day 7)  Clonidine Hcl 0.1 Mg  Tabs (Clonidine Hcl) .... Take 1 Tablet By Mouth Two Times A Day As Needed 8)  Metoprolol Tartrate 50 Mg  Tabs (Metoprolol Tartrate) .... Take 1 Tablet By Mouth Two Times A Day As Needed 9)  Synthroid 125 Mcg Tabs (Levothyroxine Sodium) .... Take 1 Tablet By Mouth Once A Day 10)  Glipizide 10 Mg Xr24h-Tab (Glipizide) .Marland Kitchen.. 1 By Mouth Once Daily 11)  Spironolactone 25 Mg Tabs (Spironolactone) .... Take One Tablet By Mouth Daily 12)  Glucophage 1000 Mg Tabs (Metformin Hcl) .Marland Kitchen.. 1 By Mouth Two Times A Day 13)  Celexa 20 Mg Tabs (Citalopram Hydrobromide) .... As Needed 14)  Promethazine Hcl  25 Mg Tabs (Promethazine Hcl) .Marland Kitchen.. 1 By Mouth Once Daily 15)  Alprazolam 1 Mg Tabs (Alprazolam) .... As Needed  Allergies (verified): 1)  ! Penicillin 2)  ! Demerol 3)  ! Codeine 4)  ! Morphine  Past History:  Past Medical History: CORONARY ARTERY DISEASE (Nonobstructive coronary artery disease 40% LAD stenosis) (ICD-414.00) HYPERTENSION, BENIGN (ICD-401.1) OBSTRUCTIVE SLEEP APNEA (ICD-327.23) (no CPAP) OBESITY (ICD-278.00) DM (ICD-250.00) HYPOTHYROIDISM (ICD-244.9) Hx of ALLERGIC RHINITIS (ICD-477.9) BACK PAIN, CHRONIC (ICD-724.5) DEGENERATIVE JOINT DISEASE, GENERALIZED (ICD-715.00)  Past Surgical History: Reviewed history from 08/04/2008 and no changes required. Total anterior cervical diskectomies Cervical fusion Right carpal tunnel release Left carpal tunnel release Uvulopalatopharyngoplasty for sleep apnea  Review of Systems       As stated in the HPI and negative for all other systems.   Vital Signs:  Patient profile:   65 year old female Height:      60 inches Weight:      193 pounds BMI:     37.83 Pulse rate:   46 / minute BP sitting:   104 / 68  (right arm) Cuff size:   regular  Vitals Entered By: Hardin Negus, RMA (August 04, 2009 10:55 AM)  Physical Exam  General:  Well developed, well nourished, in no acute distress. Head:  normocephalic and atraumatic Neck:  Neck supple,  no JVD. No masses, thyromegaly or abnormal cervical nodes. Lungs:  Clear bilaterally to auscultation and percussion. Heart:  Non-displaced PMI, chest non-tender; regular rate and rhythm, S1, S2 without murmurs, rubs or gallops. Carotid upstroke normal, no bruit. Normal abdominal aortic size, no bruits. Femorals normal pulses, no bruits. Pedals normal pulses. No edema, no varicosities. Abdomen:  Bowel sounds positive; abdomen soft and non-tender without masses, organomegaly, or hernias noted. No hepatosplenomegaly, obese Msk:  Back normal, normal gait. Muscle strength and tone  normal. Extremities:  No clubbing or cyanosis. Neurologic:  Alert and oriented x 3. Skin:  Intact without lesions or rashes. Psych:  Normal affect.   EKG  Procedure date:  08/04/2009  Findings:      sinus bradycardia, rate 46, axis within normal limits, intervals within normal limits, no acute ST-T wave changes.  Impression & Recommendations:  Problem # 1:  HYPERTENSION, BENIGN (ICD-401.1) Her blood pressure is controlled today. She says it's been in the 130s at home. At this point I would like to continue the regimen as listed. I do note that she's had bradycardia and is fatigued. However, she has multiple other reasons for her fatigue including insomnia and sleep apnea though she's had surgery for the latter. I would suggest followup with a sleep specialist to make sure she no longer has sleep apnea issues as she's not using CPAP. In the future if she continues to have this degree of bradycardia we could back off on the beta blocker.  Problem # 2:  CORONARY ARTERY DISEASE (ICD-414.00) She has no new symptoms. As above I would like to continue the beta blocker if possible. No further testing is suggested. Orders: EKG w/ Interpretation (93000)  Problem # 3:  OBESITY (ICD-278.00) She understands the need to lose weight with diet and exercise.  Patient Instructions: 1)  Your physician recommends that you schedule a follow-up appointment in: 1 year with Dr Antoine Poche 2)  Your physician recommends that you continue on your current medications as directed. Please refer to the Current Medication list given to you today.

## 2010-07-11 LAB — GLUCOSE, CAPILLARY: Glucose-Capillary: 160 mg/dL — ABNORMAL HIGH (ref 70–99)

## 2010-07-11 LAB — CBC
Hemoglobin: 13 g/dL (ref 12.0–15.0)
MCHC: 32.3 g/dL (ref 30.0–36.0)
Platelets: 208 10*3/uL (ref 150–400)
RBC: 4.34 MIL/uL (ref 3.87–5.11)

## 2010-07-11 LAB — URINALYSIS, ROUTINE W REFLEX MICROSCOPIC
Nitrite: POSITIVE — AB
Specific Gravity, Urine: 1.018 (ref 1.005–1.030)
Urobilinogen, UA: 0.2 mg/dL (ref 0.0–1.0)
pH: 5 (ref 5.0–8.0)

## 2010-07-11 LAB — COMPREHENSIVE METABOLIC PANEL
BUN: 8 mg/dL (ref 6–23)
CO2: 21 mEq/L (ref 19–32)
Calcium: 8.8 mg/dL (ref 8.4–10.5)
Creatinine, Ser: 1.19 mg/dL (ref 0.4–1.2)
GFR calc non Af Amer: 45 mL/min — ABNORMAL LOW (ref >60)
Glucose, Bld: 122 mg/dL — ABNORMAL HIGH (ref 70–99)

## 2010-07-11 LAB — URINE MICROSCOPIC-ADD ON

## 2010-07-11 LAB — DIFFERENTIAL
Basophils Absolute: 0 10*3/uL (ref 0.0–0.1)
Basophils Relative: 0 % (ref 0–1)
Eosinophils Absolute: 0 10*3/uL (ref 0.0–0.7)
Monocytes Relative: 3 % (ref 3–12)
Neutro Abs: 8.3 10*3/uL — ABNORMAL HIGH (ref 1.7–7.7)
Neutrophils Relative %: 83 % — ABNORMAL HIGH (ref 43–77)

## 2010-07-11 LAB — URINE CULTURE: Culture  Setup Time: 201111130154

## 2010-07-14 LAB — CBC
HCT: 32.7 % — ABNORMAL LOW (ref 36.0–46.0)
Hemoglobin: 10.9 g/dL — ABNORMAL LOW (ref 12.0–15.0)
MCHC: 33.2 g/dL (ref 30.0–36.0)
MCV: 85.9 fL (ref 78.0–100.0)
RDW: 15.1 % (ref 11.5–15.5)

## 2010-07-14 LAB — BASIC METABOLIC PANEL
BUN: 14 mg/dL (ref 6–23)
BUN: 16 mg/dL (ref 6–23)
BUN: 20 mg/dL (ref 6–23)
CO2: 26 mEq/L (ref 19–32)
CO2: 29 mEq/L (ref 19–32)
Calcium: 9.1 mg/dL (ref 8.4–10.5)
Calcium: 9.4 mg/dL (ref 8.4–10.5)
Chloride: 103 mEq/L (ref 96–112)
Creatinine, Ser: 1.32 mg/dL — ABNORMAL HIGH (ref 0.4–1.2)
GFR calc Af Amer: 49 mL/min — ABNORMAL LOW (ref >60)
GFR calc non Af Amer: 29 mL/min — ABNORMAL LOW (ref >60)
GFR calc non Af Amer: 40 mL/min — ABNORMAL LOW (ref >60)
Glucose, Bld: 108 mg/dL — ABNORMAL HIGH (ref 70–99)
Glucose, Bld: 95 mg/dL (ref 70–99)
Potassium: 4 mEq/L (ref 3.5–5.1)
Sodium: 139 mEq/L (ref 135–145)
Sodium: 142 mEq/L (ref 135–145)

## 2010-07-14 LAB — CARDIAC PANEL(CRET KIN+CKTOT+MB+TROPI)
CK, MB: 3.8 ng/mL (ref 0.3–4.0)
CK, MB: 4 ng/mL (ref 0.3–4.0)
CK, MB: 4.7 ng/mL — ABNORMAL HIGH (ref 0.3–4.0)
CK, MB: 7.1 ng/mL (ref 0.3–4.0)
Relative Index: 2.2 (ref 0.0–2.5)
Relative Index: 2.5 (ref 0.0–2.5)
Relative Index: 3 — ABNORMAL HIGH (ref 0.0–2.5)
Relative Index: INVALID (ref 0.0–2.5)
Total CK: 103 U/L (ref 7–177)
Total CK: 88 U/L (ref 7–177)
Troponin I: 0.21 ng/mL — ABNORMAL HIGH (ref 0.00–0.06)
Troponin I: 0.29 ng/mL — ABNORMAL HIGH (ref 0.00–0.06)
Troponin I: 0.51 ng/mL (ref 0.00–0.06)
Troponin I: 0.56 ng/mL (ref 0.00–0.06)

## 2010-07-14 LAB — GLUCOSE, CAPILLARY
Glucose-Capillary: 107 mg/dL — ABNORMAL HIGH (ref 70–99)
Glucose-Capillary: 107 mg/dL — ABNORMAL HIGH (ref 70–99)
Glucose-Capillary: 108 mg/dL — ABNORMAL HIGH (ref 70–99)
Glucose-Capillary: 109 mg/dL — ABNORMAL HIGH (ref 70–99)
Glucose-Capillary: 112 mg/dL — ABNORMAL HIGH (ref 70–99)
Glucose-Capillary: 120 mg/dL — ABNORMAL HIGH (ref 70–99)
Glucose-Capillary: 142 mg/dL — ABNORMAL HIGH (ref 70–99)
Glucose-Capillary: 172 mg/dL — ABNORMAL HIGH (ref 70–99)
Glucose-Capillary: 191 mg/dL — ABNORMAL HIGH (ref 70–99)
Glucose-Capillary: 48 mg/dL — ABNORMAL LOW (ref 70–99)
Glucose-Capillary: 66 mg/dL — ABNORMAL LOW (ref 70–99)
Glucose-Capillary: 70 mg/dL (ref 70–99)
Glucose-Capillary: 80 mg/dL (ref 70–99)
Glucose-Capillary: 81 mg/dL (ref 70–99)
Glucose-Capillary: 87 mg/dL (ref 70–99)
Glucose-Capillary: 94 mg/dL (ref 70–99)
Glucose-Capillary: 95 mg/dL (ref 70–99)

## 2010-07-14 LAB — BASIC METABOLIC PANEL WITH GFR
BUN: 20 mg/dL (ref 6–23)
CO2: 28 meq/L (ref 19–32)
Calcium: 8.8 mg/dL (ref 8.4–10.5)
Chloride: 106 meq/L (ref 96–112)
Creatinine, Ser: 1.54 mg/dL — ABNORMAL HIGH (ref 0.4–1.2)
GFR calc non Af Amer: 34 mL/min — ABNORMAL LOW
Glucose, Bld: 100 mg/dL — ABNORMAL HIGH (ref 70–99)
Potassium: 4.1 meq/L (ref 3.5–5.1)
Sodium: 140 meq/L (ref 135–145)

## 2010-07-15 LAB — COMPREHENSIVE METABOLIC PANEL
ALT: 15 U/L (ref 0–35)
Alkaline Phosphatase: 53 U/L (ref 39–117)
BUN: 19 mg/dL (ref 6–23)
Chloride: 108 mEq/L (ref 96–112)
Glucose, Bld: 89 mg/dL (ref 70–99)
Potassium: 4.7 mEq/L (ref 3.5–5.1)
Sodium: 142 mEq/L (ref 135–145)
Total Bilirubin: 0.5 mg/dL (ref 0.3–1.2)
Total Protein: 6.9 g/dL (ref 6.0–8.3)

## 2010-07-15 LAB — DIFFERENTIAL
Basophils Absolute: 0 10*3/uL (ref 0.0–0.1)
Basophils Relative: 0 % (ref 0–1)
Eosinophils Absolute: 0.2 10*3/uL (ref 0.0–0.7)
Monocytes Absolute: 0.7 10*3/uL (ref 0.1–1.0)
Monocytes Relative: 10 % (ref 3–12)
Neutro Abs: 4.5 10*3/uL (ref 1.7–7.7)
Neutrophils Relative %: 60 % (ref 43–77)

## 2010-07-15 LAB — TYPE AND SCREEN: Antibody Screen: NEGATIVE

## 2010-07-15 LAB — CBC
HCT: 32.8 % — ABNORMAL LOW (ref 36.0–46.0)
Hemoglobin: 10.9 g/dL — ABNORMAL LOW (ref 12.0–15.0)
MCV: 85.4 fL (ref 78.0–100.0)
RBC: 3.84 MIL/uL — ABNORMAL LOW (ref 3.87–5.11)
RDW: 14.8 % (ref 11.5–15.5)
WBC: 7.5 10*3/uL (ref 4.0–10.5)

## 2010-07-15 LAB — SURGICAL PCR SCREEN: MRSA, PCR: NEGATIVE

## 2010-07-17 ENCOUNTER — Telehealth: Payer: Self-pay | Admitting: Cardiology

## 2010-07-17 ENCOUNTER — Encounter: Payer: Self-pay | Admitting: Cardiology

## 2010-07-27 NOTE — Progress Notes (Signed)
Summary: pt needs note to ok her for tooth extraction   Phone Note Call from Patient   Caller: Patient [phone] Reason for Call: Talk to Nurse Summary of Call: surgical clearence for tooth extraction-needs letter to whom it may concern-pt would like to pick up=pls call a when ready Initial call taken by: Glynda Jaeger,  July 17, 2010 4:11 PM  Follow-up for Phone Call        Will ask Dr Antoine Poche   PF,RN  Additional Follow-up for Phone Call Additional follow up Details #1::        Patient is at acceptable risk for planned dental extraction. Additional Follow-up by: Rollene Rotunda, MD, [facility],  July 17, 2010 4:48 PM    Additional Follow-up for Phone Call Additional follow up Details #2::    letter typed and pt aware it is ready for pick up Follow-up by: Charolotte Capuchin, RN,  July 17, 2010 4:57 PM

## 2010-07-27 NOTE — Letter (Signed)
Summary: OK for tooth extraction   [facility] Main Office  [address]   [place], [place] [address]   Phone: [phone]  Fax: [phone]          July 17, 2010 MRN: [id number]    To Whom It May Concern:   RE:    [name]   [address]   [place], [place]  [address]   DOB 1945-10-29    Ms. PULVER is at acceptable risk for planned dental extraction.         Sincerely,     Rocco Serene, RN for  Dr. Rollene Rotunda  This letter has been electronically signed by your physician.

## 2010-07-30 ENCOUNTER — Other Ambulatory Visit: Payer: Self-pay | Admitting: Cardiology

## 2010-08-23 ENCOUNTER — Encounter: Payer: Self-pay | Admitting: Cardiology

## 2010-08-24 ENCOUNTER — Encounter: Payer: Self-pay | Admitting: Cardiology

## 2010-08-24 ENCOUNTER — Ambulatory Visit (INDEPENDENT_AMBULATORY_CARE_PROVIDER_SITE_OTHER): Payer: Medicare Other | Admitting: Cardiology

## 2010-08-24 VITALS — BP 126/68 | HR 55 | Ht 60.0 in | Wt 188.0 lb

## 2010-08-24 DIAGNOSIS — I10 Essential (primary) hypertension: Secondary | ICD-10-CM

## 2010-08-24 DIAGNOSIS — I2510 Atherosclerotic heart disease of native coronary artery without angina pectoris: Secondary | ICD-10-CM

## 2010-08-24 DIAGNOSIS — I498 Other specified cardiac arrhythmias: Secondary | ICD-10-CM

## 2010-08-24 DIAGNOSIS — R079 Chest pain, unspecified: Secondary | ICD-10-CM

## 2010-08-24 DIAGNOSIS — E669 Obesity, unspecified: Secondary | ICD-10-CM

## 2010-08-24 NOTE — Patient Instructions (Signed)
Continue current medications  Follow up as needed

## 2010-08-24 NOTE — Assessment & Plan Note (Signed)
She is having no new chest pain. No change in therapy is indicated. She will continue with risk reduction.

## 2010-08-24 NOTE — Assessment & Plan Note (Signed)
The patient understands the need to lose weight with diet and exercise. We have discussed specific strategies for this.  

## 2010-08-24 NOTE — Progress Notes (Signed)
HPI The patient presents for followup of chest discomfort. She has had no recurrence of this. She remains bradycardic but has no symptoms related to this. She denies any palpitations, presyncope or syncope. She has no chest pressure, neck or arm discomfort. She has no new shortness of breath, PND or orthopnea.  Allergies  Allergen Reactions  . Codeine   . Meperidine Hcl   . Morphine   . Penicillins     Current Outpatient Prescriptions  Medication Sig Dispense Refill  . ALPRAZolam (XANAX) 1 MG tablet Take 1 mg by mouth at bedtime as needed.        Marland Kitchen aspirin 81 MG tablet Take 81 mg by mouth daily.        . cloNIDine (CATAPRES) 0.2 MG tablet Take 0.2 mg by mouth 2 (two) times daily.        Marland Kitchen desvenlafaxine (PRISTIQ) 50 MG 24 hr tablet Take 50 mg by mouth daily.        . furosemide (LASIX) 40 MG tablet Take 40 mg by mouth daily.        . iron polysaccharides (NU-IRON) 150 MG capsule Take 150 mg by mouth daily.        . Levothyroxine Sodium 112 MCG CAPS Take by mouth daily.        . meclizine (ANTIVERT) 25 MG tablet Take 12.5 mg by mouth 3 (three) times daily as needed.        . meloxicam (MOBIC) 7.5 MG tablet Take 7.5 mg by mouth daily.        . metFORMIN (GLUCOPHAGE) 1000 MG tablet Take 1,000 mg by mouth 2 (two) times daily with a meal.        . metoprolol (LOPRESSOR) 50 MG tablet Take 50 mg by mouth 2 (two) times daily.        Marland Kitchen omeprazole (PRILOSEC) 20 MG capsule Take 20 mg by mouth daily.        Marland Kitchen rOPINIRole (REQUIP) 0.25 MG tablet Take 0.25 mg by mouth at bedtime.        . simvastatin (ZOCOR) 40 MG tablet Take 40 mg by mouth at bedtime.        Marland Kitchen spironolactone (ALDACTONE) 25 MG tablet Take 25 mg by mouth daily.        . traMADol (ULTRAM) 50 MG tablet Take 50 mg by mouth every 6 (six) hours as needed.        . valsartan (DIOVAN) 320 MG tablet Take 320 mg by mouth daily.        Marland Kitchen DISCONTD: citalopram (CELEXA) 40 MG tablet Take 40 mg by mouth daily.        Marland Kitchen DISCONTD: glipiZIDE  (GLUCOTROL) 10 MG tablet Take 10 mg by mouth daily.        Marland Kitchen DISCONTD: levothyroxine (SYNTHROID, LEVOTHROID) 88 MCG tablet Take 88 mcg by mouth daily.        Marland Kitchen DISCONTD: losartan (COZAAR) 50 MG tablet TAKE 1 TABLET EVERY DAY  30 tablet  6  . DISCONTD: simvastatin (ZOCOR) 80 MG tablet Take 80 mg by mouth at bedtime.          Past Medical History  Diagnosis Date  . CAD (coronary artery disease)      (Nonobstructive coronary artery disease 40% LAD stenosis)   . HTN (hypertension)   . OSA (obstructive sleep apnea)   . DM (diabetes mellitus)   . Hypothyroidism   . Allergic rhinitis   . Back pain   . DJD (degenerative  joint disease)     Past Surgical History  Procedure Date  . Cervical disc surgery   . Carpal tunnel release   . Uvulopalatopharyngoplasty   . Salpingoophorectomy     ROS: As stated in the HPI and negative for all other systems.  PHYSICAL EXAM BP 126/68  Pulse 55  Ht 5' (1.524 m)  Wt 188 lb (85.276 kg)  BMI 36.72 kg/m2 GENERAL:  Well appearing NECK:  No jugular venous distention, waveform within normal limits, carotid upstroke brisk and symmetric, no bruits, no thyromegaly LUNGS:  Clear to auscultation bilaterally BACK:  No CVA tenderness CHEST:  Unremarkable HEART:  PMI not displaced or sustained,S1 and S2 within normal limits, no S3, no S4, no clicks, no rubs, no murmurs ABD:  Flat, positive bowel sounds normal in frequency in pitch, no bruits, no rebound, no guarding, no midline pulsatile mass, no hepatomegaly, no splenomegaly, obese EXT:  2 plus pulses throughout, no edema, no cyanosis no clubbing SKIN:  No rashes no nodules NEURO:  Cranial nerves II through XII grossly intact, motor grossly intact throughout PSYCH:  Cognitively intact, oriented to person place and time   ASSESSMENT AND PLAN

## 2010-08-24 NOTE — Assessment & Plan Note (Signed)
She does tolerate beta blockers symptoms related to this. He is therapeutic.

## 2010-09-12 NOTE — Assessment & Plan Note (Signed)
[facility]                            CARDIOLOGY OFFICE NOTE   NAME:[name], [name]                 MRN:          [id number]  DATE:08/13/2007                            DOB:          [birth year]    PRIMARY:  Dr. Laurann Montana.   THE REASON FOR PRESENTATION:  Evaluate the patient with difficult to  control hypertension.   HISTORY OF PRESENT ILLNESS:  The patient is 65 years old.  Since I last  saw her she has had uvulopalatopharyngoplasty and no longer needs CPAP.  She also had a hospitalization in March with severe kidney infection,  dehydration, and renal insufficiency.  She is slowly recovering from  this.  She feels like she has an upper respiratory infection currently.  She is having some cold sweats and some mildly decreased appetite.  She  is not describing any fevers, chills or productive cough.  Her blood  pressure has actually been very well-controlled and she has been able to  come off amlodipine.  With her illness she lost about 20 pounds.  She  denies any chest discomfort or new shortness of breath.   PAST MEDICAL HISTORY:  Nonobstructive coronary artery disease (40% LAD  stenosis), well-preserved ejection fraction, degenerative to joint  disease, diabetes mellitus, hypertension, hypothyroidism, hepatic lesion  (benign followed by CT scan), chronic back pain, obesity, right total  knee replacement, total anterior cervical diskectomies, cervical fusion,  right carpal tunnel release, left carpal tunnel release,  uvulopalatopharyngoplasty for sleep apnea.   ALLERGIES:  PENICILLIN, CODEINE, DEMEROL.   MEDICATIONS:  1. Simvastatin 80 mg daily.  2. Aspirin 81 mg daily.  3. Furosemide 40 mg daily.  4. Mobic 7.5 mg daily.  5. Cozaar 100 mg daily.  6. Synthroid 135 mcg daily.  7. Clonidine 0.2 mg b.i.d.  8. Metoprolol 50 mg b.i.d.  9. Glipizide 10 mg daily.  10.Spironolactone 25 mg daily.  11.Glucophage 1000 mg b.i.d.   REVIEW  OF SYSTEMS:  As stated in HPI otherwise negative for other  systems.   PHYSICAL EXAMINATION:  The patient is in no distress.  Blood pressure 114/68, heart rate 52 and regular, weight 205 pounds,  body mass index 32.  NECK:  No jugular distention at 45 degrees. Carotid upstroke brisk and  symmetrical.  No bruits, thyromegaly.  LYMPHATICS:  No cervical, axillary, inguinal adenopathy.  LUNGS:  Clear to auscultation bilaterally.  HEART:  PMI not displaced or sustained, S1-S2 within normal.  No S3-S4,  clicks, rubs, murmurs.  ABDOMEN:  Obese, positive bowel sounds.  Normal in frequency pitch, no  bruits, rebound, guarding, no midline pulsatile mass, organomegaly.  SKIN:  No rash, no nodule.  EXTREMITIES:  2+ pulses, no edema.   EKG sinus bradycardia, rate 56, axis within normal limits, intervals  within normal limits, borderline low voltage in the anterior leads.   ASSESSMENT/PLAN:  1. Hypertension.  Blood pressure is much better controlled.  With her      weight loss and treatment of her sleep apnea, she may be able to      back off further on meds over time.  I will  defer to her primary      care doctors who will see her more frequently.  Perhaps backing off      on the clonidine over time.  At this point no further med titration      or workup for secondary causes is indicated.  2. Nonobstructive coronary disease.  She will continue with primary      risk reduction.  With her diabetes, the goal should be an LDL less      than 100, HDL greater than 50.  I will defer to Dr. Cliffton Asters.  3. Obesity.  I applaud the weight loss though it was really related to      her illness.  I encourage more of the same in terms of weight loss      with diet and exercise.  4. Renal insufficiency.  Seemed to have resolution of this with her      last creatinine 1.34.  This is probably related to diabetes and      hypertension, giving her some baseline renal insufficiency and so      this needs to be  followed carefully, particularly with the      combination of spironolactone and ACE inhibitor.  She understands      this as we discussed this today.  5. Followup.  Will see her back in 1 year or sooner if needed.     Rollene Rotunda, MD, [facility]  Electronically Signed    JH/MedQ  DD: 08/13/2007  DT: 08/13/2007  Job #: [id number]   cc:   Stacie Acres. Cliffton Asters, M.D.

## 2010-09-12 NOTE — H&P (Signed)
[name], [name]          ACCOUNT NO.:  [id number]   MEDICAL RECORD NO.:  [id number]          PATIENT TYPE:  INP   LOCATION:  4703                         FACILITY:  [facility]   PHYSICIAN:  Kela Millin, M.D.DATE OF BIRTH:  June 27, 1945   DATE OF ADMISSION:  07/17/2007  DATE OF DISCHARGE:                              HISTORY & PHYSICAL   PRIMARY CARE Noemi Ishmael:  Dr. Hal Hope.   CHIEF COMPLAINT:  Nausea, vomiting and diarrhea.   HISTORY OF PRESENT ILLNESS:  The patient is a 65 year old white female  with a past medical history significant for diabetes, congestive heart  failure, hypertension, hypothyroidism, chronic back pain, obesity,  history of cervical surgeries x2, history of right total knee  replacement and bilateral carpal tunnel surgery, also a history of  obstructive sleep apnea status post surgery who presents with the above  complaints. She states that she has had nausea and vomiting for the past  5-6 days and on the day prior to admission she went to her primary care  Willies Laviolette and was given Phenergan suppositories. The suppositories seem  to help the nausea slightly but the diarrhea appeared to be worse and so  she came to the ER.  The patient states that she was having about five  to six episodes of vomiting - nonbloody.  She reports that she was  having three to four loose stools daily.  She admits to vague diffuse  abdominal pain - burning sensation.  She denies fevers, cough, dysuria,  melena, hematochezia, chest pain and no shortness of breath.   She was seen in the ER and a urinalysis was consistent with a UTI, her  white cell count 13.  Her BUN was elevated at 76 with a creatinine of  3.0. (her last creatinine per e-chart September 2008 was 0.8).  She is  admitted for further evaluation and management.   [name] states that she has not been taking her medications in the  past 2 days because of the nausea and vomiting.   PAST MEDICAL  HISTORY:  As above.   MEDICATIONS:  1. Phenergan suppositories 25 q.6 h p.r.n.  2. Mobic 7.5 mg p.o. daily.  3. Glipizide ER 10 mg daily.  4. Spironolactone 25 mg daily.  5. Metoprolol 50 mg p.o. b.i.d.  6. Glucophage 1000 mg b.i.d.  7. Celexa 20 mg daily.  8. Synthroid 137 mcg daily.  9. Zocor 80 mg daily.  10.Lasix 40 mg daily.  11.Clonidine 0.2 mg b.i.d.  12.Cozaar 100 mg daily.   ALLERGIES:  CODEINE, DEMEROL, MORPHINE and PENICILLIN.   SOCIAL HISTORY:  She denies tobacco and also denies alcohol.   FAMILY HISTORY:  Noncontributory to current illness.   REVIEW OF SYSTEMS:  As per HPI.   PHYSICAL EXAM:  GENERAL:  The patient is an obese elderly white female  in no apparent distress.  VITAL SIGNS:  Her temperature is 97.1 with a blood pressure of 126/55,  pulse of 56, respiratory rate of 20, O2 sat of 97%.  HEENT:  PERRL, EOMI, sclerae anicteric, dry mucous membranes.  No oral  exudates.  NECK:  Supple, no  adenopathy, no thyromegaly and no JVD.  LUNGS:  Decreased breath sounds at the bases, otherwise clear to  auscultation.  CARDIOVASCULAR:  Regular rate and rhythm.  Normal S1 and S2.  ABDOMEN:  Obese, soft, bowel sounds present, mild diffuse tenderness but  greater in the epigastrium and left upper quadrant, no rebound  tenderness.  No masses palpable.  EXTREMITIES:  No cyanosis and no edema.  NEURO:  She is alert and oriented x3. Cranial nerves II-XII grossly  intact.  Nonfocal exam.   LABORATORY DATA:  White cell count is 13 with a hemoglobin of 13.8,  hematocrit of 41.3, platelet count 282, neutrophil count 76%.  Sodium is  133 with a potassium of 5.1, BUN 76, glucose is 83, pH is 7.44,  creatinine is 3.0.  Urinalysis cloudy with moderate leukocyte esterase,  11-20 WBCs, many bacteria.   ASSESSMENT AND PLAN:  1. Nausea, vomiting and diarrhea - likely gastroenteritis, urinary      tract infection also contributing to the nausea and vomiting.  Will      obtain  stool studies also a serum lipase follow.  Supportive care,      IV fluids.  2. Urinary tract infection - obtain cultures, empiric antibiotics.  3. Acute renal failure - multifactorial - secondary to number      one/volume depletion, #2 as well and also medications - ARB in      setting of volume depletion and also she has been on diuretics      NSAIDs.  Will hydrate, hold off diuretics, NSAIDs and ARB, follow      recheck and further evaluate as appropriate if not improving.  4. Hypertension - monitor blood pressures and treat as appropriate.  5. Diabetes mellitus - Accu-Cheks, sliding scale only while n.p.o.  6. History of congestive heart failure - compensated at this time.      Will follow and monitor fluid status.  7. Hypothyroidism - follow and resume Synthroid.  8. Chronic back pain - pain management.      Kela Millin, M.D.  Electronically Signed     ACV/MEDQ  D:  07/18/2007  T:  07/18/2007  Job:  [id number]   cc:   Herbie Baltimore, M.D.

## 2010-09-12 NOTE — Assessment & Plan Note (Signed)
[facility]                             PULMONARY OFFICE NOTE   NAME:Loria, KELSI BENHAM                 MRN:          [id number]  DATE:09/17/2006                            DOB:          1946/01/31    SUBJECTIVE:  Ms. Samara comes in today after recent upper airway  surgery for her mild obstructive sleep apnea. The patient recently  underwent nasal septal reconstruction with turbinate reduction and also  UP3.  The patient states that since the surgery in her healing process  she no longer snores and sleeps much better during the night off of the  CPAP. She has not been on CPAP since her surgery. The patient states  that her sleep deficiency is much improved since the surgery and she has  very little daytime sleepiness with periods of inactivity. Apparently,  the patient has discussed with Dr.  Jenne Pane about going back on CPAP for a  period of time.   PHYSICAL EXAMINATION:  She is an obese female in no acute distress.  Blood pressure 104/64, pulse 54, temperature 98.2, weight is 224 pounds.  O2 saturation on room air is 93%.  Oropharynx shows a trimmed palate that appears to be healing well.   IMPRESSION:  History of mild obstructive sleep apnea, which has been  treated with nasal septal reconstruction, turbinate reduction and UP3.  The patient feels that her sleep is much improved and her husband no  longer hears snoring. The patient is not 100% symptom-free, but overall  feels fairly satisfied with her degree of alertness upon awakening and  during the day. She is not sure that any benefit she will get from  continuous positive airway pressure (CPAP) is worth the inconvenience. I  have had a long discussion with her about her sleep apnea and have  explained that this is of very little health consequence to her at this  particular time. Really the decision about continuous positive airway  pressure (CPAP) or not should be made with her lifestyle  and quality of  life in mind. Currently, the patient has decided to go a little bit  longer without the continuous positive airway pressure (CPAP) and then  she may give herself a trial back on it to see whether or not it is  justified from a quality of life standpoint. Again, I have re-iterated  to her that this is of very little consequence to her health at this  particular level, but she needs to work aggressively on weight loss or  it can get worse over time.   PLAN:  1. Work on weight loss.  2. The patient will take a little bit more time off of continuous      positive airway pressure (CPAP) and then maybe try and get back on      it for a period of time to see if she sees a large difference or      not and then she can make her final decision whether any benefit      she gets is      worth the inconvenience in terms of quality of  life.  3. The patient will followup in six months or sooner if there are      problems.     Barbaraann Share, MD,FCCP  Electronically Signed    KMC/MedQ  DD: 09/17/2006  DT: 09/17/2006  Job #: [id number]   cc:   Antony Contras, MD  Celso Amy, PA

## 2010-09-12 NOTE — Procedures (Signed)
NAME:  [name], [name]          ACCOUNT NO.:  [id number]   MEDICAL RECORD NO.:  [id number]           PATIENT TYPE:  OUT   LOCATION:  SLEEP CENTER                 FACILITY:  [facility]   PHYSICIAN:  Clinton D. Maple Hudson, MD, FCCP, FACPDATE OF BIRTH:  Sep 16, 1945   DATE OF STUDY:  03/23/2007                            NOCTURNAL POLYSOMNOGRAM   REFERRING PHYSICIAN:  Antony Contras, MD   INDICATION FOR STUDY:  Hypersomnia with sleep apnea.   EPWORTH SLEEPINESS SCORE:  4/24, BMI 43.6, weight 224 pounds, height  60.1 inches, neck 17.5 inches.   MEDICATIONS:  Home medications are charted and reviewed.   SLEEP ARCHITECTURE:  Total sleep time 349 minutes with sleep efficiency  70.9%.  Stage I was 7%, stage II 83%, stage III absent, REM 8.9% of  total sleep time.  Sleep latency 14 minutes, REM latency 241 minutes,  awake after sleep onset 33 minutes, arousal index 14.2.  No bedtime  medication was taken.   RESPIRATORY DATA:  Apnea-hypopnea index (AHI, RDI) 0 per hour.  There  were no scorable respiratory events.   OXYGEN DATA:  Moderately loud snoring with oxygen desaturation to a  nadir of 87%.  Mean oxygen saturation through the study was 92% on room  air.   CARDIAC DATA:  Sinus rhythm with PACs and PVCs.   MOVEMENT-PARASOMNIA:  Frequent limb jerks with a total of 224, of which  38.5 were associated with arousal or awakening.   IMPRESSIONS-RECOMMENDATIONS:  1. Adequate sleep time and architecture for evaluation with no bedtime      medication taken.  2. Moderately loud snoring but no respiratory events meeting scoring      criteria.  Apnea-hypopnea index 0 per hour.  3. Periodic limb movement syndrome with arousal, 38.5 per hour.      Consider therapeutic trial of specific therapy, such as ReQuip, if      clinically appropriate.      Clinton D. Maple Hudson, MD, [facility], FACP  Diplomate, Biomedical engineer of Sleep Medicine  Electronically Signed     CDY/MEDQ  D:  03/30/2007 09:17:41  T:   03/30/2007 16:52:30  Job:  [id number]

## 2010-09-15 NOTE — Discharge Summary (Signed)
[name], [name]                      ACCOUNT NO.:  [id number]   MEDICAL RECORD NO.:  [id number]                    PATIENT TYPE:   LOCATION:                                       FACILITY:   PHYSICIAN:  [id number]                                DATE OF BIRTH:   DATE OF ADMISSION:  10/16/2001  DATE OF DISCHARGE:  10/19/2001                                 DISCHARGE SUMMARY   DISCHARGE DIAGNOSES:  1. Chest pain, status post cardiac catheterization.  2. Coronary artery disease.  3. Diabetes mellitus type 2.  4. Hypertension.  5. Hypothyroidism.   HOSPITAL COURSE:  The patient is a 65 year old female who initially  presented to the [facility] Office with complaints of shortness of breath and  chest discomfort.  She was seen by Dr. Bonnee Quin who felt that given the  patient's symptoms and her past medical history, that she should undergo  cardiac catheterization the next day.  She was advised to go by ambulance to  [facility] however, she preferred transfer by private vehicle.   The next day, the patient was seen by Dr. Rollene Rotunda.  The patient  reported some chest pain overnight.  However, Dr. Antoine Poche noted the  patient's cardiac enzymes were negative x2.  He also noted that her TSH was  low at 0.0004 and planned to adjust her thyroid replacement therapy before  discharge.   By the end of the day, the patient was taken to the cath lab by Dr. Daisey Must.  Catheterization results:   1. Left main coronary artery: Normal.  2. Left anterior descending: Thirty percent stenosis at the ostium, 50%     stenosis in the proximal vessel, 50% stenosis in the mid vessel with     subsequent diffuse luminal irregularity.  3. Left circumflex: Large, dominant; minor luminal irregularities in the     proximal and mid vessel; 30% stenosis in the first obtuse marginal branch     which is quite large.  4. Right coronary artery: Small, nondominant, normal.  5. Left ventriculogram showed  ejection fraction of 55-60% with no mitral     regurgitation and no wall motion abnormalities.  6. Intravascular ultrasound of the left circumflex reveals mild     atherosclerotic disease of the proximal and mid vessel with mild     calcification.   Dr. Gerri Spore felt that the patient should be managed medically and needed  aggressive control of her blood pressure as she became quite hypertensive  during the procedure requiring intravenous labetalol.  He also noted that  she had been enrolled ASTEROID Study.   The next day, the patient was again seen by Dr. Antoine Poche.  She had had no  further chest pain or shortness of breath.  He noted a moderate to large  right groin hematoma.  As a result,  he felt she should remain in the  hospital overnight with close observation.   That evening, nursing felt that the patient's hematoma had grown larger.  She was then seen by Dr. Pattricia Boss.  She recommended an additional  period of manual pressure followed by bed rest overnight.   The next morning, the patient stated she felt like things were swelling  again.  Dr. Antoine Poche noted large ecchymosis along with a hematoma in the  right groin with a question of whether or not it was pulsatile.  He ordered  repeat CBC as well as a vascular ultrasound.  Vascular ultrasound revealed  no pseudoaneurysm.  Repeat CBC showed a mild improvement in her anemia.  She  is felt to be stable for discharge.   DISCHARGE MEDICATIONS:  1. ASTEROID study drug (to be refurnished by research staff).  2. Glucotrol XL 5 mg q.d.  3. Lisinopril 10 mg q.d.  4. Synthroid 250 mcg (one 200 mcg and one 50 mcg tablet) q.d.  5. Lasix 20 mg q.d.  6. Enteric-coated aspirin 81 mg q.d.   LABORATORY VALUES:  White count 10.1, hemoglobin 10.0, hematocrit 38.7,  platelets 226.  Sodium 140, potassium 4.0, chloride 107, CO2 26, BUN 16,  creatinine 0.6, glucose 132, calcium 8.9.  TSH 0.0004.     Annett Fabian, P.A. [facility]                   [id number]    CKM/MEDQ  D:  01/07/2002  T:  01/09/2002  Job:  [id number]

## 2010-09-15 NOTE — H&P (Signed)
[name]          ACCOUNT NO.:  [id number]   MEDICAL RECORD NO.:  [id number]          PATIENT TYPE:  INP   LOCATION:  2899                         FACILITY:  [facility]   PHYSICIAN:  Payton Doughty, M.D.      DATE OF BIRTH:  1945-12-30   DATE OF ADMISSION:  01/25/2004  DATE OF DISCHARGE:                                HISTORY & PHYSICAL   ADMISSION DIAGNOSES:  Herniated disk C6-7, C7-T1 on the right.   HISTORY OF PRESENT ILLNESS:  A very nice 65 year old, right-handed, white  lady whom I operated on 10 years ago at 5-6, about a year ago at C6-7  anteriorly.  She has done well.  MRI demonstrates disk disease at C6-7,  solid fusion.  She reported increasing pain in her right arm.  MRI  demonstrates a herniated disk at C7-T1 eccentric to the right.  She has  weakness in her right hand, and she is now admitted for posterior cervical  laminectomy at C6-7.   PAST MEDICAL HISTORY:  Remarkable for heart failure and adult-onset  diabetes.   MEDICATIONS:  1.  Aspirin 1 a day.  2.  Cozaar once a day.  3.  Glipizide standard release once a day.  4.  Klor-Con daily.  5.  Rosuvastatin for cholesterol.  6.  Synthroid twice a day.  7.  Mobic once a day.  8.  Toprol XL once a day.  9.  Lasix once a day.  10. Glucosamine.   ALLERGIES:  PENICILLIN, CODEINE, and MORPHINE.   PAST SURGICAL HISTORY:  1.  Anterior cervical as noted above.  2.  Three knee operations by Dr. Thomasena Edis.  3.  Heel spurs by Dr. Montez Morita.   SOCIAL HISTORY:  She does not smoke, does not drink.  Works for  [employer].   FAMILY HISTORY:  Parents are deceased.  Histories are not given.   REVIEW OF SYSTEMS:  Remarkable for glasses, nasal congestion, mouth sores,  hypertension, heart murmur, back pain, arm pain, arthritis, neck pain,  diabetes, and thyroid disease.   PHYSICAL EXAMINATION:  HEENT:  Within normal limits.  NECK:  She has limited range of motion in her neck.  CHEST:  Clear.  CARDIAC:   Regular rate and rhythm with 2/6 holosystolic murmur.  ABDOMEN:  Nontender with no hepatosplenomegaly.  EXTREMITIES:  Without clubbing or cyanosis.  GU:  Exam deferred.  PERIPHERAL PULSES:  Good.  NEUROLOGIC:  She is awake, alert, and oriented.  Cranial nerves are intact.  Motor exam showed 5/5 strength throughout the upper and lower extremities  save for the interossei and grip on the right which at 5-/5.  She has some  non-dermatomal numbness in the right hand.  Reflexes are 1 at the biceps on  the right, 2 on the left, 2 at the triceps bilaterally, flicker at the  brachial radialis on the right, 2 on the left.   LABORATORY AND X-RAY DATA:  Her MRI demonstrates a disk at C7-T1 eccentric  to the right with compression of the right C8 root.   CLINICAL IMPRESSION:  Herniated disk C7-T1 with right C8 radiculopathy.  To do this anteriorly would require splitting of the sternum.  I think she  would better served by a posterior operation.  We are going to do diskectomy  posteriorly and then place oasis screws.  The risks and benefits of this  approach have been discussed with her, and she wishes to proceed.       MWR/MEDQ  D:  01/25/2004  T:  01/25/2004  Job:  [id number]

## 2010-09-15 NOTE — Op Note (Signed)
NAMESHARONA, ROVNER          ACCOUNT NO.:  [id number]   MEDICAL RECORD NO.:  [id number]          PATIENT TYPE:  INP   LOCATION:  3022                         FACILITY:  [facility]   PHYSICIAN:  Payton Doughty, M.D.      DATE OF BIRTH:  [birth year]   DATE OF PROCEDURE:  01/25/2004  DATE OF DISCHARGE:                                 OPERATIVE REPORT   PREOPERATIVE DIAGNOSIS:  Herniated disk, cervical 7, thoracic 1 on the right  side.   POSTOPERATIVE DIAGNOSIS:  Herniated disk, cervical 7, thoracic 1 on the  right side.   OPERATION PERFORMED:  Abandoned posterior cervical fusion.   SURGEON:  Payton Doughty, M.D.   COMPLICATIONS:  The patient slipped in the [place] head holder.   DESCRIPTION OF OPERATION:  This 65 year old right-handed white lady was  brought to the operating room, ____________ intubated, and being placed  prone on the operating table in the [place] head holder for a posterior  cervical fusion.  During the course of this positioning she slipped in the  head hol,der and sustained about a 3 cm laceration of her left scalp  posteriorly behind the hairline.  She was quickly placed back onto traveling  gurney.  The area was stapled initially to gain control of bleeding.  Then  it was prepped.  Staples were removed and the formal closure was done with 3-  0 Vicryl and 3-0 nylon in the skin.  Betadine and Telfa dressing was  applied.   The patient woke up without any ill effects.   The procedure will be postponed until her scalp heals.       MWR/MEDQ  D:  01/25/2004  T:  01/26/2004  Job:  [id number]

## 2010-09-15 NOTE — Op Note (Signed)
[name], [name]          ACCOUNT NO.:  [id number]   MEDICAL RECORD NO.:  [id number]          PATIENT TYPE:  INP   LOCATION:  0007                         [facility]ACILITY:  [facility]   PHYSICIAN:  Erasmo Leventhal, M.D.DATE OF BIRTH:  1945-11-30   DATE OF PROCEDURE:  05/24/2004  DATE OF DISCHARGE:                                 OPERATIVE REPORT   PREOPERATIVE DIAGNOSIS:  Left knee end-stage osteoarthritis.   POSTOPERATIVE DIAGNOSIS:  Left knee end-stage osteoarthritis.   PROCEDURE:  Left total knee arthroplasty.   SURGEON:  Erasmo Leventhal, M.D.   ASSISTANT:  Jaquelyn Bitter. Chabon, P.A.-C.   ANESTHESIA:  General with femoral nerve block.   ESTIMATED BLOOD LOSS:  Less than 100 cc.   DRAINS:  Two Hemovacs.   COMPLICATIONS:  None.   TOURNIQUET TIME:  1 hour, 30 minutes at 350 mmHg.   DISPOSITION:  PACU, stable.   IMPLANTS:  Osteonics components all cemented, Scorpion size 5 femur, size 5  tibia, 15 mm flexed posterior _________ tibial insert and 26 mm patella, all  cemented.   DESCRIPTION OF PROCEDURE:  The patient had been counseled in the holding  area, correct side was identified, chart was reviewed and signed  appropriately.  Femoral nerve block had been administered, IV antibiotics  had been given. Taken to the OR, placed in the supine position, anesthesia  given, Foley catheter placed, prepped in sterile technique by the OR  circulating nurse.  Prominences were well padded.  Left knee was examined.  She had just a slight flexion contracture, could flex 125 degrees, elevated,  prepped and draped in sterile fashion.  Exsanguinated with an Esmarch,  tourniquet inflated to 250 mmHg.   Straight midline incision was made in the skin, subcutaneous tissue, medial  and soft tissue flaps developed at the appropriate level.  Medial  parapatellar arthrotomy was performed _________ tibia, medial tibia, soft  tissue release was done.  Patella was everted, knee was flexed.  End-stage  arthritic changes, bone against bone. Cruciate ligaments were resected,  starter hole made in distal femur, canal was irrigated until the effluent  was clear.  Intermedullary reamer gently placed.  Chose a 5 degrees valgus  cut with a 10 mm cut off the distal femur, distal femur found to be size 5.  Rotation marks were made, cut to fit a size 5.  Medial and lateral menisci  were removed, geniculate vessels were coagulated.  Posterior neurovascular  structures were followed out and protected throughout the entire case.  Tibial eminence was resected, proximal tibia found to be a size 5.  Acetabular reamer was utilized, intramedullary surgery was utilized.  Canal  was irrigated until the effluent was made.  Intermedullary rod was gently  placed.   I chose to take a 10 mm cut based upon the lateral side which was about 2 mm  above the defect on the medial side. This was done in a 0 degrees slope.  Posteromedial, posterolateral femoral osteophytes were removed under direct  visualization.  The femoral cut was prepared in a standard fashion.  At this  time, it was  size 5 femur,  size 5 tibia with a 10 mm flexed insert which  gave good range of motion, soft tissue balance, flexion and extension lines  excellent. Gaps were well balanced. Rotation marks were made, deltoid keel  was performed in standard fashion.  Patella was found to be a size 26, it  was reamed to the appropriate depth.  Marker holes were made and excess bone  was removed.   At this time, utilizing pulse lavage, the knee was copiously irrigated and  the cement was thoroughly mixed.  Utilizing modern and _________ cement  technique, all components were cemented in place, size 5 femur, size 5 tibia  with a 26 patella.  After cement had cured, we did 10, 12 and 15 mm thick  implants with a 15 mm flexed, posterior stabilized tibial implant with an  excellent range of motion, balance and flexion extension, tracking was   excellent.  Trial was then removed, excess cement was removed. Bone wax was  placed to exposed bony surfaces and the final 15 mm implant was implanted.  Two medium Hemovac drains were placed.  The knee and soft tissues were  irrigated with ________ and saline solution.   Sequential closure of layers were done, ________ with Vicryl, subcutaneous  Vicryl, skin closed with Monocryl suture. Sterile, compressive dressing was  applied, tourniquet was deflated. Another g of Ancef given intravenously.  She had excellent circulation to foot and ankle at the end of the case.  Ice  pack was applied, knee immobilizer in full extension. The patient tolerated  the procedure well, there were no complications. Sponge and needle counts  were correct. She was then gently awakened, she was taken from the operating  room in stable condition.   PLAN:  Stabilization in the PACU tonight.   To decrease surgical time, help throughout the entire surgical procedure,  Mr. Jeannett Senior Chabon's assistance was needed.      RAC/MEDQ  D:  05/24/2004  T:  05/24/2004  Job:  [id number]

## 2010-09-15 NOTE — Assessment & Plan Note (Signed)
[facility]                              CARDIOLOGY OFFICE NOTE   NAME:Obrien, Leah GREENBAUM                 MRN:          [id number]  DATE:01/14/2006                            DOB:          Aug 14, 1945    PRIMARY CARDIOLOGIST:  Dr. Rollene Rotunda.   [name] is a 65 year old female who was initially evaluated for her  blood pressure on December 20, 2005 preoperative clearance.  She had her beta  blocker increased, but since her blood pressure was still elevated on the  followup visit December 25, 2005, clonidine was added.  She is here today in  followup.   [name] reports symptoms of fatigue.  She denies any chest pain.  She  states that she can clean one room in her house but then has to stop and  rest.  She does not feel like the fatigue has increased significantly in the  last few weeks.  She says that she has some lower extremity edema but this  is  well controlled by the Lasix.  She denies any other symptoms.   CURRENT MEDICATIONS:  1. Simvastatin 80 mg a day.  2. Aspirin 81 mg a day.  3. Furosemide 40 mg a day.  4. Mobic 7.5 is on hold.  5. Glipizide ER 5 mg q. day.  6. Cozaar 100 mg q. day.  7. Lisinopril 40 mg a day.  8. Potassium 20 mEq a day.  9. Metoprolol 50 mg b.i.d.  10.Synthroid 175 mcg q. day.  11.Amlodipine 10 mg a day.  12.Clonidine 0.1 mg b.i.d.  13.Over-the-counter iron q. day.   PHYSICAL EXAMINATION:  VITAL SIGNS:  Blood pressure is 128/64 on the right  and 134/70 in the left.  Heart rate is 78.  Weight is 221.04 pounds which is  3.6 pounds higher than it was 3 weeks ago.  GENERAL:  She is a well-developed, obese elderly white female in no acute  distress.  NECK:  There is no JVD, no thyromegaly and no carotid bruits are  appreciated.  LUNGS:  She has a few rales in the bases but they are otherwise clear.  CARDIOVASCULAR:  Her heart is regular rate and rhythm with an S1, S2 and a  soft systolic murmur is  noted at the left sternal border.  ABDOMEN:  Soft, nontender with active bowel sounds.  EXTREMITIES:  Distal pulses are 2+ in all four extremities and there is no  edema noted.   IMPRESSION:  1. Hypertension:  Her blood pressure is much improved on the clonidine.      She is to continue her current medical therapy.  2. History of non-obstructive coronary artery disease by cath in 2003 with      30 to 50% stenoses in the left anterior descendingand circumflex,      ejection fraction 55 to 60.  3. Obesity.  4. Dyslipidemia with last lipid profile in May 2007 showing HDL of 30.8      and LDL 120, Zocor increased by her primary care physician at that      time.  5. Hypothyroidism.  6. Diabetes with  a hemoglobin A1C of 6.6 in May 2005.  7. Degenerative joint disease.  8. Benign hepatic lesion followed by CT.  9. Fatigue:  She is to follow up with Tereso Newcomer in approximately 4      weeks and see if her symptoms have improved.  She is encouraged to      increase her activity as she will tolerate.  She is also to keep her      scheduled followup appointment with her primary care physician, at      which time her CBC and TSH will be checked.  10.Anemia, over-the-counter added.   PLAN:  [name] is to continue her current medical therapy.  She is  encouraged to keep her scheduled followup appointments with her primary care  physician and follow up with Tereso Newcomer in one month, to make sure that  her blood pressure has stayed under control.                                 Theodore Demark, PA-C                           Madolyn Frieze. Jens Som, MD, [facility]   RB/MedQ  DD:  01/14/2006  DT:  01/14/2006  Job #:  [id number]   cc:   Celso Amy, [facility]

## 2010-09-15 NOTE — Procedures (Signed)
[name], [name]          ACCOUNT NO.:  [id number]   MEDICAL RECORD NO.:  [id number]          PATIENT TYPE:  OUT   LOCATION:  SLEEP CENTER                 FACILITY:  [facility]   PHYSICIAN:  Barbaraann Share, MD,FCCPDATE OF BIRTH:  June 19[birth year] 1943   DATE OF STUDY:  04/22/2006                            NOCTURNAL POLYSOMNOGRAM   REFERRING PHYSICIAN:  Marcelyn Bruins MD   INDICATIONS FOR THE STUDY:  Hypersomnia with sleep apnea.   EPWORTH SLEEPINESS SCORE:  1.   SLEEP ARCHITECTURE:  Patient had a total sleep time of 411 minutes with  very little REM and no slow wave sleep.  Sleep onset latency was normal,  and REM onset was very prolonged at 419 minutes.  Sleep efficiency was  fairly normal at 91%.   RESPIRATORY DATA:  Patient was found to have 76 hypopneas and 6 apneas  for a Respiratory Disturbance Index of 12 events per hour.  The events  were not positional, but there was moderate snoring throughout.   OXYGEN DATA:  His O2 desaturation was low as 86% with the patient's  obstructive events.   CARDIAC DATA:  No clinically significant cardiac arrhythmias were noted.   MOVEMENT-PARASOMNIA:  Small numbers of leg jerks with nothing that  appeared to be of clinical significance.   IMPRESSIONS-RECOMMENDATIONS:  Mild obstructive sleep apnea/hypopnea  syndrome with a Respiratory Disturbance Index of 12 events per hour and  O2 desaturation as low as 86%.  Treatment for this degree of sleep apnea  should focus on weight loss alone, if applicable, upper airway surgery,  oral appliance, and also CPAP.  Clinical correlation is suggested.      Barbaraann Share, MD,FCCP  Diplomate, American Board of Sleep  Medicine     KMC/MEDQ  D:  05/09/2006 16:25:30  T:  05/09/2006 21:34:16  Job:  [id number]

## 2010-09-15 NOTE — Discharge Summary (Signed)
   NAME:  [name], [name]                      ACCOUNT NO.:  [id number]   MEDICAL RECORD NO.:  [id number]                    PATIENT TYPE:   LOCATION:                                       FACILITY:   PHYSICIAN:  Annett Fabian, P.A. [facility]            DATE OF BIRTH:   DATE OF ADMISSION:  DATE OF DISCHARGE:                                 DISCHARGE SUMMARY   ADDENDUM:  In referring to the TSH the value should be 0.004.   DISCHARGE INSTRUCTION:  1. The patient is to avoid driving, heavy lifting or tub baths for three     days.  2. She is to follow a low fat, diabetic diet.  3. She is to watch the cath site for any increased pain, bleeding or     swelling and to call the [place] office if any of these problems.  4. She is to follow up in the [place] office on June 26th or 27th with P.A.     visit for a groin check and the office will call with the time.  5. Research office will call the patient to set up the first visit.                                               Annett Fabian, P.A. [facility]    CKM/MEDQ  D:  01/07/2002  T:  01/09/2002  Job:  [id number]   cc:   Rollene Rotunda, MD [facility]  [address]  [place]  [place] [address]  Fax: 1

## 2010-09-15 NOTE — Op Note (Signed)
[name], [name]          ACCOUNT NO.:  [id number]   MEDICAL RECORD NO.:  [id number]          PATIENT TYPE:  AMB   LOCATION:  DSC                          FACILITY:  [facility]   PHYSICIAN:  Vikki Ports, MDDATE OF BIRTH:  April 04, 1946   DATE OF PROCEDURE:  04/18/2004  DATE OF DISCHARGE:                                 OPERATIVE REPORT   PREOPERATIVE DIAGNOSIS:  Abnormal left mammogram.   POSTOPERATIVE DIAGNOSIS:  Abnormal left mammogram.   PROCEDURE:  Needle localization, left excisional breast biopsy.   SURGEON:  Vikki Ports, MD   ANESTHESIA:  MAC.   DESCRIPTION OF PROCEDURE:  The patient was taken to the operating room and  placed in the supine position after adequate MAC anesthesia was induced.  The left breast was prepped and draped in the normal sterile fashion.  Using  1% lidocaine local anesthesia, the skin overlying the needle was  anesthetized.  A curvilinear incision was made extending from the wire up  superiorly.  All mass surrounding the wire was excised in its entirety down  to approximately 5-6 cm.  This was sent for specimen mammography which  verified the presence of the mass.  Adequate hemostasis was ensured, and the  skin was closed with subcuticular 4-0 Monocryl.  Steri-Strips and sterile  dressings were applied.  The patient tolerated the procedure well and went  to PACU in good condition.      Gaylyn Rong   KRH/MEDQ  D:  04/18/2004  T:  04/18/2004  Job:  [id number]

## 2010-09-15 NOTE — H&P (Signed)
NAMEQUANASIA, DEFINO          ACCOUNT NO.:  [id number]   MEDICAL RECORD NO.:  [id number]          PATIENT TYPE:  INP   LOCATION:  NA                           FACILITY:  [facility]   PHYSICIAN:  Payton Doughty, M.D.      DATE OF BIRTH:  28-Sep-1945   DATE OF ADMISSION:  02/11/2004  DATE OF DISCHARGE:                                HISTORY & PHYSICAL   ADMISSION DIAGNOSES:  Herniated disk C7-T1 on the right.   BODY OF TEXT:  A 65 year old right-handed white lady who has had two  anterior cervical diskectomies and fusion of C5-6 and C6-7. She has done  well. She has had increasing pain in her right arm and MRI demonstrates a  disk at C7-T1. She has weakness in her right hand. She is admitted for  posterior cervical laminectomy and fusion at C7-T1. This was attempted two  weeks ago and she slipped in the pins because of a laceration on her head.  The case was put off until today.   PAST MEDICAL HISTORY:  Remarkable for heart failure, adult onset diabetes.   MEDICATIONS:  1.  Aspirin one a day.  2.  Cozaar one a day.  3.  Glipizide.  4.  Klor-Con.  5.  Rosuvastatin.  6.  Synthroid.  7.  Mobic.  8.  Toprol.  9.  Glucosamine.   ALLERGIES:  She is allergic to PENICILLIN, CODEINE, and MORPHINE.   PAST SURGICAL HISTORY:  Anterior cervical diskectomy as noted above, knee  operation by Dr. Thomasena Edis, and heel spurs by Dr. Montez Morita.   SOCIAL HISTORY:  She does not smoke, does not drink, and works for [employer].   FAMILY HISTORY:  Her parents are deceased. History is not given.   REVIEW OF SYSTEMS:  Remarkable for glasses, nasal congestion, mouth sores,  hypertension, heart murmur, back pain, pain from arthritis, neck pain,  diabetes, and thyroid disease.   PHYSICAL EXAMINATION:  Her HEENT is within normal limits. She has limited  range of motion of her neck. Chest is clear. Cardiac exam is regular rate  and rhythm. Abdomen is nontender without hepatosplenomegaly.  Extremities  without clubbing or cyanosis. GU exam is deferred. Peripheral pulses are  good. Neurologically, she is awake, alert, and oriented. Cranial nerves are  intact. Motor exam shows 5/5 strength throughout the upper and lower  extremities. __________ grip on the right which are 5-/5. She has  nondermatomal numbness in the right hand. Reflexes are 1 at the biceps on  the right, 2 on the left, 2 at the triceps bilaterally, __________ at the  brachial radialis on the right and 2 on the left.   MRI demonstrates a disk at C7-T1 on the right with compression at C8 root.   CLINICAL IMPRESSION:  Herniated disk at C7-T1 with right C8 radiculopathy.   PLAN:  Posterior laminectomy and placement of screws. The risks and benefits  of this approach have been discussed with her and she wishes to proceed.       MWR/MEDQ  D:  02/11/2004  T:  02/11/2004  Job:  [id number]

## 2010-09-15 NOTE — Op Note (Signed)
[facility]  Patient:    [name], KIDNEY                 MRN: [id number] Proc. Date: 05/07/00 Adm. Date:  [id number] Attending:  Montez Morita, Philips John                           Operative Report  PREOPERATIVE DIAGNOSIS:  Achilles tendon rupture, old.  POSTOPERATIVE DIAGNOSIS:  Achilles tendon rupture, old.  OPERATION PERFORMED:  Repair Achilles tendon with some reconstruction.  DESCRIPTION OF PROCEDURE:  After suitable general anesthesia, she was positioned prone and prepped and draped routinely. After elevation of the limb and upper thigh, the tourniquet was inflated to 350 mmHg. There were 2 previous parallel incisions, one medial and one lateral. The lateral incision didnt seem to be free, the medial incision was adherent. We felt it would be best to use the medial incision. We had discussed in the office the risk of loosing skin and the possibility of plastic surgery ______ cross leg skin flap should the skin not hold. An incision was made through the previous medial incision, carried a bit more medially proximally and then swung transversely and up the lateral border in an S shaped fashion. The end of the tendon which was approximately 2 inches of the insertion was palpated and the peritenon opened in a similar S shaped fashion over it. The peritenon ended at that level and actually was a very very slimy thin peritenon. Deep to that was another real thickened peritenon which we then opened and again it ended distally with about 2 inches of scar tissue between the end of the tendon and the widely resected os calcis. This thick peritenon was freed up about 3 inches above and with finger dissection underneath. I then split the scar tissue down the middle and pried it off of the base of the os calcis so that it could be reapproximated and used to reinforce the repair. The tendon was dissected free from the end and a #2 Ethibond suture with 2 Mellody Dance  needles was then threaded in a Binnels type and then the end of the scar tissue cut off and brought through the end. A curette was used to freshen up the base of the bone. Two drill holes were made, two Mellody Dance needles passing throughout the heel. These were then passed through one large piece of Adaptic which was folded up into a tiny square so that it was probably 15 thick and then over button so that the Ethibond would be tied over a button and would be later caught and the end of the suture allowed to be in the heel. The tendon was pulled down by means of the suture. The heel was placed in maximum plantar flexion to accomplish this and the suture was tied over the button. Using #1 PDS suture, the two flaps of the thickened scar tissue were then sutured to and over the reimplanted Achilles and thick peritenon was closed and a little slimy peritenon on top of it was closed using some #1 PDS on the thicker portions and then going to 2-0 further down. The tourniquet was released at this point and there had been good hemostasis throughout. The wound was dry. No subcutaneous tissues were placed and just intermittent nylon suturing and at the end warm saline was used to clean and get the duraprep off. The circulation appeared to be basically okay. Concerned with the area between  the two previous incisions looked reasonably well. A big bulky dressing was placed over that area so that the calves could be ______ in this area, inspected in a few days. A short leg cast was then applied with plaster and maximum plantar flexion and then she was turned onto her back on a stretcher and a long leg cast applied with the knee in 60-70 degrees of flexion. She goes to recovery in good condition. DD:  05/07/00 TD:  05/07/00 Job: [id number]

## 2010-09-15 NOTE — Assessment & Plan Note (Signed)
[facility]                             PULMONARY OFFICE NOTE   NAME:Obrien, [name]                 MRN:          [id number]  DATE:04/12/2006                            DOB:          [birth year]    SLEEP MEDICINE CONSULTATION   HISTORY OF PRESENT ILLNESS:  The patient is a 65 year old female who I  have been asked to see for possible sleep apnea.  The patient states  that she has been told she has loud snoring as well as pauses in her  breathing during sleep.  She describes classic choking arousals.  She  typically goes to bed between 10 p.m. and 2 a.m. and gets up around 7  a.m. to start her day.  She is not rested upon arising.  She has  difficulties with sleep onset at times and awakens at least five times a  night for unknown reasons.  The patient currently is on disability but  notes significant sleep pressure all of the time.  She does not have  time to nap, even though she feels like she could take one, because she  raises her 48-year-old grandchild.  The patient does have some sleepiness  with driving long distances but no problems with short distances.  Of  note, the patient's weight is up 10 pounds over the last few years.   PAST MEDICAL HISTORY:  Significant for:  1. Hypertension.  2. Diabetes.  3. Dyslipidemia.  4. History of allergic rhinitis.  5. History of cervical disc surgery x3.  6. Status post hysterectomy.  7. History of hypothyroidism.  8. History of nonobstructive coronary disease at catheterization.  9. Status post right total knee replacement.   CURRENT MEDICATIONS:  1. AcipHex 20 mg daily.  2. Paroxetine 10 mg daily.   The patient is intolerant or allergic to:  1. PENICILLIN.  2. DEMEROL.  3. CODEINE.  4. MORPHINE.   SOCIAL HISTORY:  She is married and has children.  She has never smoked.   FAMILY HISTORY:  Remarkable for father and brother having heart disease,  and mother and sister having had a history  of breast cancer.   REVIEW OF SYSTEMS:  As per history of present illness.  Also see patient  intake form documented in the chart.   PHYSICAL EXAMINATION:  GENERAL:  She is an obese female in no acute  distress.  VITAL SIGNS:  Blood pressure 124/78, pulse 97, temperature 97.8, weight  is 236 pounds, O2 saturation on room air is 94%.  HEENT:  Pupils equal, round, and reactive to light and accommodation.  Extraocular muscles are intact.  Nares show septal deviation to the  right.  Oropharynx shows mild elongation of the soft palate and uvula.  NECK:  Supple without JVD or lymphadenopathy.  There is no palpable  thyromegaly.  CHEST:  Totally clear.  CARDIAC:  Reveals regular rate and rhythm with a 2/6 systolic ejection  murmur.  ABDOMEN:  Soft, nontender, with good bowel sounds.  GENITAL, RECTAL, BREAST:  Not done and not indicated.  LOWER EXTREMITIES:  Show trace edema.  Pulses were intact distally.  NEUROLOGIC:  Alert and oriented with no obvious motor deficits.   IMPRESSION:  Probable obstructive sleep apnea.  The patient gives a very  good history for sleep-disordered breathing and is having poor sleep  efficiency and at least some degree of sleep pressure during the day.  She has an abnormal upper airway anatomy and is significantly obese.  I  had a long discussion with her about the pathophysiology of sleep apnea  and how we typically make the diagnosis.  She is agreeable to having a  sleep study.   PLAN:  1. Schedule for nocturnal polysomnography.  2. Work on weight loss.  3. The patient will follow up after the above.     Barbaraann Share, MD,FCCP  Electronically Signed    [facility]/MedQ  DD: 04/19/2006  DT: 04/19/2006  Job #: [id number]   cc:   Rollene Rotunda, MD, [facility]  Celso Amy, P.A.

## 2010-09-15 NOTE — Assessment & Plan Note (Signed)
[facility]                              CARDIOLOGY OFFICE NOTE   NAME:[name], [name]                 MRN:          [id number]  DATE:12/25/2005                            DOB:          December 22, 1945    This is a pleasant 65 year old white female who is here for blood pressure  followup.  She was recently cleared for shoulder surgery, which she is  scheduled for on Thursday.  Dr. Antoine Poche saw her on December 20, 2005 for  preoperative clearance, and her blood pressure was up.  He asked that she  return today for additional medications if her blood pressure remained  elevated.  He had increased her metoprolol to 50 b.i.d. that visit.  Since  then, she checks her blood pressure daily.  Yesterday, she said it was  183/92 and has been running high.  Today in the office, blood pressure was  152/90 with a large cuff.  When she was here on December 20, 2005, her blood  pressure was 176/110.  She denies any other cardiac complaints.  No chest  pain, shortness of breath, dizziness or presyncope.   CURRENT MEDICATIONS:  1. Simvastatin 80 mg daily.  2. Coated aspirin 81 mg daily.  3. Furosemide 40 mg daily.  4. Mobic 7.5 mg daily.  5. Glipizide ER 5 mg daily.  6. Cozaar 100 mg daily.  7. Lisinopril 40 mg daily.  8. Potassium 20 mEq daily.  9. Metoprolol 50 mg b.i.d.  10.Synthroid 175 mcg daily.  11.Amlodipine 10 mg daily.   PHYSICAL EXAMINATION:  GENERAL:  This is a pleasant 65 year old white female  in no acute distress.  VITAL SIGNS:  Blood pressure 152/90, pulse 88, weight 217.08.  NECK:  Without JVD, hepatojugular reflux, bruit or thyroid enlargement.  LUNGS:  Clear anterior, posterior and lateral.  HEART:  Regular rate and rhythm at 88 beats per minute.  Normal S1 and S2  with a 2/6 systolic murmur at the left sternal border.  ABDOMEN:  Soft without organomegaly, masses, lesions or abnormal tenderness.  EXTREMITIES:  Without clubbing, cyanosis, or  edema.  She has good distal  pulses.   IMPRESSION:  1. Hypertension, uncontrolled.  2. Non-obstructive coronary artery disease on cardiac catheterization in      the past with history of atypical chest pain.  3. Obesity.  4. Dyslipidemia.  5. Diabetes mellitus.  6. General joint disease.  7. Hypothyroidism.  8. Hepatic lesion, benign and followed by CT.   PLAN AT THIS TIME:  I will add clonidine 0.1 mg b.i.d.  I have asked her to  take her blood pressure daily and keep a list of this to bring back in 2-3  weeks for followup.                                  Jacolyn Reedy, PA-C                           Cecil Cranker, MD, [facility]   ML/MedQ  DD:  12/25/2005  DT:  12/25/2005  Job #:  [id number]   cc:   Rollene Rotunda, MD, [facility]  Erasmo Leventhal, MD  Celso Amy, P.A.

## 2010-09-15 NOTE — Cardiovascular Report (Signed)
[place]  Patient:    [name], [name] Visit Number: [id number] MRN: [id number]          Service Type: MED Location: [phone] Attending Physician:  Rollene Rotunda Dictated by:   Daisey Must, M.D. [facility] Proc. Date: 10/17/01 Admit Date:  10/16/2001 Discharge Date: 10/19/2001   CC:         Juluis Mire, M.D.  Rollene Rotunda, M.D. [facility]  Cardiac Catheterization Laboratory   Cardiac Catheterization  PROCEDURES PERFORMED: 1. Left heart catheterization with coronary angiography and left    ventriculography. 2. Intravascular ultrasound of the left circumflex coronary artery as    part of the ASTEROID protocol.  INDICATIONS: The patient is a 65 year old woman with multiple cardiac risk factors. She has had symptoms of progressive chest pain and shortness of breath. She was admitted and referred for cardiac catheterization.  DESCRIPTION OF PROCEDURE: A 6 French sheath was placed in the right femoral artery.  We initially performed diagnostic left heart catheterization with standard Judkins 6 French catheters including a 6 [place] JL4, JL 3.5, JL4 catheter and angled pigtail catheter. We had some difficulty visualizing the left anterior descending artery as the catheters tended to subselect the left circumflex. However, with the JL 3.5 catheter and then subsequently with a JL4 6 [place] guiding catheter, we were able to adequately visualize the LAD. Following completion of the diagnostic catheterization, intravascular ultrasound of the left circumflex coronary artery was performed. Heparin was administered to achieve an ACT of greater than 200 seconds. We used a 6 [place] guiding catheter and a Hi-Torque Floppy wire. Intravascular ultrasound of the mid and proximal left circumflex were performed utilizing an Atlantis ultrasound catheter with ultrasound images recorded on video tape with automated pullback of the catheter. Following  completion of the procedure, a guide wire and catheters were removed. Final angiographic images revealed no evidence of vessel damage. There were no complications.  RESULTS:  HEMODYNAMICS: Left ventricular pressure 186/30, aortic pressure 186/78. Of note, the patient was quite hypertensive requiring a total of 40 mg of intravenous labetalol.  LEFT VENTRICULOGRAM: Left ventriculography reveals normal wall motion, ejection fraction estimated at 55-60%. There is no mitral regurgitation.  CORONARY ARTERIOGRAPHY: (Left dominant).  Left main is normal.  Left anterior descending artery has a 30% stenosis at its ostium. In the proximal LAD there is a 50% stenosis and the mid LAD has a 60% stenosis. The distal LAD has diffuse luminal irregularities. The LAD gives rise to two small diagonal branches.  Left circumflex is a large dominant vessel. There are minor luminal irregularities in the proximal and mid vessel. The circumflex gives rise to a large OM-1 which has a 30% stenosis. There are small second and third marginal branches. The distal circumflex gives rise to a normal sized posterolateral branch and a small posterior descending artery.  The right coronary artery is a small nondominant vessel. The right coronary artery is normal.  Intravascular ultrasound of the left circumflex reveals very mild atherosclerotic disease of the proximal and mid vessel with mild calcification.  IMPRESSIONS: 1. Normal left ventricular systolic function with elevated left ventricular    end-diastolic pressure and systemic hypertension consistent with    possible hypertensive heart disease and diastolic dysfunction. 2. Moderate one-vessel disease involving the left anterior descending artery    as described. This did not appear to be hemodynamically significant.  PLAN: The patient will be managed medically. She needs aggressive control of her blood pressure. She  will also be managed in followup as  per the ASTEROID protocol. Dictated by:   Daisey Must, M.D. [facility] Attending Physician:  Rollene Rotunda DD:  10/17/01 TD:  10/20/01 Job: [id number]

## 2010-09-15 NOTE — H&P (Signed)
[name], [name]          ACCOUNT NO.:  [id number]   MEDICAL RECORD NO.:  [id number]           PATIENT TYPE:   LOCATION:                                 FACILITY:   PHYSICIAN:  Erasmo Leventhal, M.D.DATE OF BIRTH:  Apr 14, 1946   DATE OF ADMISSION:  DATE OF DISCHARGE:                                HISTORY & PHYSICAL   DATE OF SURGERY:  May 24, 2004   CHIEF COMPLAINT:  End-stage osteoarthritis left knee.   HISTORY OF PRESENT ILLNESS:  This 65 year old female well known to [place] with  history of bilateral knee end-stage osteoarthritis who has undergone total  knee arthroplasty without difficulty.  She has done well with the total knee  on the right and has failed conservative measures to relieve her pain from  her end-stage osteoarthritis of her left knee.  After discussion of  treatment benefits, risks, and options, the patient is now scheduled for  total knee arthroplasty on the left knee.  The surgery risks, benefits, and  aftercare were discussed in detail with the patient, questions invited and  answered, and surgery to go ahead as scheduled.   PAST MEDICAL HISTORY:  Drug allergy to PENICILLIN which causes itching,  MORPHINE which causes itching, and CODEINE which causes itching.   Current medications include:  1.  Lisinopril 20 mg one daily.  2.  Crestor 40 mg one daily.  3.  Klor-Con M20 tablets 20 mEq one daily.  4.  Mobic 7.5 mg one daily.  5.  Furosemide 40 mg one daily.  6.  Toprol-XL 50 mg one daily.  7.  Cozaar 100 mg one daily.  8.  Synthroid 0.125 mg two daily.  9.  Glipizide 5 mg one daily.  10. Alprazolam 0.5 mg one q.i.d. p.r.n.  11. Norvasc 10 mg one q.a.m.   Serious medical illnesses include:  1.  Diabetes.  2.  Hypertension.  3.  Hypercholesterolemia.  4.  Hypothyroidism.   Previous surgery includes:  1.  Right total knee replacement.  2.  Cervical fusion.  3.  Right carpal tunnel release.  4.  Left cubital tunnel release.   FAMILY HISTORY:  Positive for coronary artery disease, hypertension,  diabetes, cancer, and CVA.   REVIEW OF SYMPTOMS:  CENTRAL NERVOUS SYSTEM:  Negative for headache, blurred  vision, or dizziness.  PULMONARY:  Negative for shortness of breath, PND,  orthopnea.  CARDIOVASCULAR:  Positive for history of congestive heart  failure, hypertension, and negative for chest pain or palpitation.  GI:  Positive for GERD.  Negative for ulcer.  GU:  Negative for urinary tract  difficulty.  MUSCULOSKELETAL:  Positive as in HPI.   PHYSICAL EXAMINATION:  VITAL SIGNS:  Blood pressure 160/84, respirations 16,  pulse 86 and regular.  GENERAL APPEARANCE:  This is a well-developed, well-nourished lady in no  acute distress.  HEENT:  Head normocephalic.  Nose patent, ears patent.  Pupils equal, round,  and reactive to light.  Throat without injection.  NECK:  Supple without adenopathy.  Carotids 2+ without bruit.  CHEST:  Clear to auscultation.  No rales or rhonchi.  Respirations 16.  HEART:  Regular rate and rhythm at 86 beats per minute with a 1/6 systolic  ejection murmur.  ABDOMEN:  Soft with active bowel sounds.  No masses or organomegaly.  NEUROLOGIC:  The patient alert and oriented to time, place, and person.  Cranial nerves II-XII grossly intact.  EXTREMITIES:  The right knee is status post total knee arthroplasty with 0-  100 degree range of motion, neurovascular status is intact.  Left knee was 0-  135 degree range of motion, varus deformity, crepitation throughout the  range of motion.  Neurovascular status is intact.  Dorsalis pedis sodium  posterior tibialis pulses are 2+.   X-rays show end-stage osteoarthritis left knee.   IMPRESSION:  End-stage osteoarthritis left knee.   PLAN:  Total knee arthroplasty left knee.  We will have Dr. Antoine Poche, her  cardiologist, follow her in the hospital and [facility] follow her  for management of her diabetes.       ________________________________________  Jaquelyn Bitter. Chabon, P.A.  ___________________________________________  Erasmo Leventhal, M.D.    [facility]/MEDQ  D:  05/15/2004  T:  05/15/2004  Job:  [id number]

## 2010-09-15 NOTE — Op Note (Signed)
[name], [name]          ACCOUNT NO.:  [id number]   MEDICAL RECORD NO.:  [id number]          PATIENT TYPE:  INP   LOCATION:  2899                         FACILITY:  [facility]   PHYSICIAN:  Payton Doughty, M.D.      DATE OF BIRTH:  [birth year]   DATE OF PROCEDURE:  02/11/2004  DATE OF DISCHARGE:                                 OPERATIVE REPORT   PREOPERATIVE DIAGNOSIS:  Herniated disk at C7-T1 on the right.   POSTOPERATIVE DIAGNOSIS:  Herniated disk at C7-T1 on the right.   OPERATIVE PROCEDURES:  1.  Right C7-T1 laminotomy, foraminotomy and diskectomy.  2.  Right C6-T1 posterior fusion with Oasis system and morcellized      allograft.   SURGEON:  Payton Doughty, M.D.   ANESTHESIA:  General endotracheal.   PREPARATION:  Sterile prep and scrub with alcohol wipe.   COMPLICATIONS:  None.   NURSE ASSISTANT:  [place].   DOCTOR ASSISTANT:  Stefani Dama, M.D.   DESCRIPTION OF PROCEDURE:  A 65 year old lady with herniated disk at C7-T on  the right.  She was the was taken to the operating room, intubated, place on  the Stryker table and turned prone in 5 pounds of Gardner-Wells traction.  Following shave, prep and drape in the usual sterile fashion, the skin was  incised from the top of C5 to the bottom of T1 and the lamina of C6, C7 and  T1, as well as the transverse of T1, were exposed bilaterally in the  subperiosteal plane.  Intraoperative x-ray confirmed correctness at this  level.  Having confirmed correctness of the level, foraminotomy was carried  out on the right side at C7-T1.  This allowed exposure of the right C8 root  under the microscope and using microdissection technique, the right C8 root  was explored and underneath it was found several fragments of disks, which  were removed without difficulty.  The wound was irrigated and hemostasis  assured.  T1 pedicle screws were placed bilaterally.  C7 lateral __________  screws were placed bilaterally.  On the right when  tightening the wrench,  the right C7 lateral __________ fractured.  It was quite small.  Therefore,  a hook was placed under C7 from above after a small laminotomy and this was  connected to the T1 pedicle screw.  Compression was performed and good  fixation was obtained.  The bone was decorticated and morcellized allograft  with DBX was placed in it.  The fascia was then reapproximated with 0 Vicryl  in interrupted fashion.  The subcutaneous tissues were reapproximated with 0  Vicryl in interrupted fashion.  The subcuticular tissues were  reapproximated with 3-0 Vicryl in interrupted fashion.  The skin was closed  with 3-0 nylon in running lock fashion.  A Betadine Telfa dressing was  applied and made occlusive with OpSite.  The patient returned to the  recovery room in good condition.       MWR/MEDQ  D:  02/11/2004  T:  02/11/2004  Job:  [id number]

## 2010-09-15 NOTE — Discharge Summary (Signed)
NAME:  RAMONITA, KOENIG                    ACCOUNT NO.:  [id number]   MEDICAL RECORD NO.:  [id number]                   PATIENT TYPE:  INP   LOCATION:  0456                                 FACILITY:  [facility]   PHYSICIAN:  Erasmo Leventhal, M.D.         DATE OF BIRTH:  10-25-1945   DATE OF ADMISSION:  09/24/2003  DATE OF DISCHARGE:  09/29/2003                                 DISCHARGE SUMMARY   ADMISSION DIAGNOSIS:  Osteoarthritis right knee.   DISCHARGE DIAGNOSIS:  Osteoarthritis right knee.   OPERATION:  Total knee arthroplasty right knee.   HISTORY OF PRESENT ILLNESS:  This is a 66 year old lady with long history of  osteoarthritis right knee with failure of conservative management.  After  discussion of treatment risks and options, the patient is now scheduled for  total knee arthroplasty of the right knee.  She has had medical clearance by  Dr. Tiburcio Pea and Dr. Antoine Poche, her medical and cardiology doctors, and surgery  is to ahead as scheduled.   LABORATORY DATA:  Admission CBC showed hemoglobin slightly low at 11.9,  hematocrit slightly low at 35.7, otherwise normal.  Admission CMET within  normal limits.  Admission PT within normal limits.  Admission PTT within  normal limits.  Initial urinalysis showed small amount of bilirubin, trace  ketones.  This was repeated and repeat urinalysis showed 3 to 6 wbc per high  powered field and some mild mucus.  She was started on Cipro and this  cleared.  Postoperatively, her hemoglobin and hematocrits by discharge were  10.0 and 29.0 and stable.  The BMET showed low potassium which was treated  by medical service and on discharge was 4.0 and her PT/PTT by discharge was  18.2 with INR 1.8.   HOSPITAL COURSE:  The patient tolerated the operative procedure well.  Medical consult was obtained for management of hyperglycemia and  hypertension postoperatively.   The first postoperative day, the patient was doing well.  Pain was  controlled with PCA without difficulty.  Temperature was to 100.5 maximum.  Hemovac was discontinued without difficulty.   The third postoperative day, the patient was feeling pretty good.  Her vital  signs were stable.  Her temperature was to 100.7 max.  Hemoglobin 10,  hematocrit 29.3.  Potassium was slightly low at 3.3, glucose remained mildly  elevated.  Her dressing was changed.  She had several small blisters around  the Steri-Strips but no skin breakdown or infection.  No effusion in the  knee.  Minimal swelling.  Calves were negative.  Heart sounds were normal.  Lung sounds were clear except very minimal left lower lobe rhonchi.  She was  to continue with her incentive spirometry and cough with deep breath.   The fourth postoperative day, she continued to feel good.  Her vital signs  were stable.  She was now afebrile.  Dressings were changed.  Blisters were  drying without difficulty.  No evidence of infection.  Calves  were negative.  Lungs were clear.  Bowel sounds were active.  She was up to 0 to 70 on CPM  already.  Her potassium was corrected by the medical service and she had no  significant medical problems.   On the fifth postoperative day, she was feeling good with no complaints.  Vital signs stable and afebrile.  Potassium corrected back to 4.  PT 18.2  with INR 1.8.  Her dressing was changed.  Her wound was benign.  Her calves  were negative and she was subsequently discharged home for follow-up in the  office.   CONDITION ON DISCHARGE:  Improved.   DISCHARGE MEDICATIONS:  1. Coumadin.  2. Percocet.  3. Robaxin.  4. Trinsicon.  5. Home medications with her home potassium medicine to 20 mEq a day instead     of 10 per medicine service.   DISCHARGE INSTRUCTIONS:  She is to do her home CPM, her home exercise.  She  will be on Coumadin for a total of three weeks postoperatively.  She will  see [place] back in 10 days, see her medical doctor in two weeks and call if  any  problems or questions arise.     Jaquelyn Bitter. Chabon, P.A.                   Erasmo Leventhal, M.D.    SJC/MEDQ  D:  09/29/2003  T:  09/29/2003  Job:  [id number]

## 2010-09-15 NOTE — Assessment & Plan Note (Signed)
[facility]                              CARDIOLOGY OFFICE NOTE   NAME:[name], [name]                 MRN:          [id number]  DATE:12/20/2005                            DOB:          1946-02-21    REFERRING PHYSICIAN:  Dr. Valma Cava.   PRIMARY:  Celso Amy, PA   REASON FOR PRESENTATION:  Preoperative consultation of patient with non-  obstructive coronary disease and hypertension.   HISTORY OF PRESENT ILLNESS:  Patient is referred by anesthesiologist prior  to Dr. Thomasena Edis doing a right shoulder surgery.  She has a history of chest  discomfort.  She has non-obstructive coronary disease as described below.  She also has difficulty to control hypertension and other cardiac risk  factors.  She has not had any new symptoms.  I saw her a couple of months  ago.  She was complaining of some chest discomfort.  It is a stable resting  pattern.  It happens sporadically.  She may have a couple of episodes in a  day and then not have any for weeks.  It is a discomfort that radiates  through to her back.  Somewhat sharp.  There is no associated nausea,  vomiting or diaphoresis.  It does not radiate to her neck or to her arms.  She cannot bring this on.  She does do some chores without being able to  bring this on.  She denies any shortness of breath, PND or orthopnea.  She  had no palpitations, pre-syncope or syncope.   PAST MEDICAL HISTORY:  1. Non-obstructive coronary disease (40% LAD stenosed).  Well preserved      ejection fraction.  2. Dyslipidemia.  3. General joint disease.  4. Diabetes mellitus.  5. Hypertension.  6. Hypothyroidism.  7. Hepatic lesion (benign and followed by CT).   PAST SURGICAL HISTORY:  1. Right total knee replacement.  2. Two anterior cervical diskectomies, cervical fusion, right carpal      tunnel release, left carpal tunnel release, recent fixation of a      fibrous mastopathy.   ALLERGIES:  PENICILLIN,  MORPHINE and CODEINE.   MEDICATIONS:  1. Simvastatin 80 mg q. day.  2. Aspirin 81 mg q. day.  3. Furosemide 40 mg.  4. Mobic 7.5 mg  5. Glipizide 500 mg q. day.  6. Cozaar 100 mg q. day.  7. Norvasc 10 mg q. day.  8. __________  9. Lisinopril 45 mg q. day.  10.Potassium 20 mg q. day.  11.Synthroid 150 micrograms q. day.   REVIEW OF SYSTEMS:  As stated in the HPI, otherwise consistent.   PHYSICAL EXAMINATION:  GENERAL:  Patient is in no distress.  VITAL SIGNS:  Blood pressure 176/110.  Heart rate 84 and regular.  Weight  222 pounds.  Body mass index 43.  HEENT:  Eyes unremarkable.  Pupils equal, round, reactive to light.  Fundi  not visualized.  NECK:  No jugular venous distention at 45 degrees, carotid upstroke brisk  and symmetrical, no bruits, no thyromegaly.  LYMPHATICS:  No cervical, axillary, inguinal adenopathy.  LUNGS:  Clear to auscultation bilaterally.  BACK:  No costovertebral tenderness.  CHEST:  Unremarkable.  HEART:  PMI nondisplaced or sustained.  S1, S2 normal  No S3, no S4, no  murmurs.  ABDOMEN:  Obese, positive bowel sounds.  Normal in pitch.  SKIN:  No rashes.  EXTREMITIES:  2+ pulses. No edema.  NEURO:  Oriented to person, place and time.  Cranial nerves II-XII grossly  intact.  Motor intact.   EKG:  Sinus rhythm, rate 84, low voltage, axis right-ward, intervals within  normal limits, no acute ST-T wave changes.   ASSESSMENT AND PLAN:  The patient's chest, is atypical.  She has a stable  pattern.  She had non-obstructive coronary disease and catheterization in  the past.  No further cardiovascular testing is suggested at this point.   ASSESSMENT/PLAN:  1. She would be an acceptable risk for the plan/procedure according to      ACC/CH guidelines.  She does need to have better blood pressure control      for her hypertension.  I am going to increase her Metoprolol to 50 mg      b.i.d.  This probably will not be enough to control her blood pressure       given today's reading.  I am going to bring her back in a couple of      days to be seen by our physicians assistant.  If her blood pressure      remains elevated she will have to have Clonidine added.  I want to get      this taken care of before her surgery next week.  2. Obesity.  She understands she needs to lose weight with diet and      exercise.  3. Followup.  I will see her back in a few months for routine followup.      We will also be available to see her in the hospital as needed.                               Rollene Rotunda, MD, [facility]    JH/MedQ  DD:  12/20/2005  DT:  12/20/2005  Job #:  [id number]   cc:   Erasmo Leventhal, MD  Celso Amy, NP

## 2010-09-15 NOTE — H&P (Signed)
NAME:  [name], [name] NO.:  [id number]   MEDICAL RECORD NO.:  [id number]                   PATIENT TYPE:   LOCATION:                                       FACILITY:   PHYSICIAN:  Erasmo Leventhal, M.D.         DATE OF BIRTH:  May 07, 1945   DATE OF ADMISSION:  DATE OF DISCHARGE:                                HISTORY & PHYSICAL   CHIEF COMPLAINT:  Right knee osteoarthritis.   HISTORY OF PRESENT ILLNESS:  This is a 65 year old lady with a long history  of osteoarthritis of the right knee with failure of conservative management.  After discussion of treatment options, risks, and benefits, the patient is  now scheduled for total knee arthroplasty of the right knee.  She has had  medical clearance by her medical doctors, Dr. Tiburcio Pea and Dr. Antoine Poche, and  surgery will go ahead as scheduled after she has been evaluated for  hypertension noted today on her physical exam and medical clearance for  same.   PAST MEDICAL HISTORY:   ALLERGIES:  1. Drug allergy to PENICILLIN which causes itching.  2. CODEINE which causes itching and rash.   MEDICATIONS:  1. Glucotrol XL 5 mg daily.  2. Lisinopril 10 mg daily.  3. Simvastatin 40 mg daily.  4. Synthroid 250 mcg daily.  5. Lasix 40 mg daily.  6. Glipizide ER 5 mg daily.  7. Cozaar 100 mg daily.  8. Toprol XL 50 mg daily.   PAST SURGICAL HISTORY:  1. Tonsillectomy.  2. D&C.  3. Left knee arthroscopy x2.  4. Right knee surgery x1.  5. Hysterectomy.  6. Heel spur surgery.   SOCIAL HISTORY:  The patient is married and works in a [employer].  She does not smoke or drink.   FAMILY HISTORY:  Positive for coronary artery disease, hypertension,  diabetes, cancer, and CVA.   REVIEW OF SYSTEMS:  CENTRAL NERVOUS SYSTEM:  Negative for __________,  blurred vision, or dizziness.  PULMONARY:  Negative for shortness of breath,  PND, or orthopnea.  CARDIOVASCULAR:  Positive for hypertension and a  history  of one episode of CHF.  GASTROINTESTINAL:  Positive for history of hiatal  hernia.  GENITOURINARY:  Negative for urinary tract difficulty.  MUSCULOSKELETAL:  Positive as in HPI.   PHYSICAL EXAMINATION:  VITAL SIGNS:  Blood pressure 210/104, repeat 200/100.  Respirations 16, pulse 84.  I spoke with Dr. Daleen Squibb who is on call for Dr.  Antoine Poche today about her blood pressure.  He recommended taking another  lisinopril 10 mg tonight, another Toprol 50 mg tonight, and then keeping the  dose at 20 mg with lisinopril and 100 mg of Toprol until she is seen at the  hypertension clinic on Wednesday and then her medications will be evaluated  and adjusted.  If there are further problems they will let us know.  We may  need to alter her surgical date, but if her blood pressure is  under control,  surgery will go ahead as scheduled.  GENERAL APPEARANCE:  She is a well-developed, well-nourished lady in no  acute distress.  HEENT:  Head normocephalic.  Nose patent.  Ears patent.  Pupils equal,  round, and reactive to light.  Throat without injection.  NECK:  Supple without adenopathy, carotids 2+ without bruit.  CHEST:  Clear to auscultation.  No rales or rhonchi.  Respirations 16.  HEART:  Regular rate and rhythm at 84 beats per minute with a 1/6 systolic  ejection murmur.  ABDOMEN:  Soft with active bowel sounds.  No masses or organomegaly.  NEUROLOGIC:  The patient is alert and oriented to time, place, and person.  Cranial nerves II-XII grossly intact.  EXTREMITIES:  Show the right knee with a varus deformity, end-stage  osteoarthritis, 0-125 degree range of motion.  Dorsalis pedis and posterior  tibialis pulses are 1+ to 2- and sensation is intact.  X-rays show end-stage  osteoarthritis, right knee.   IMPRESSION:  End-stage osteoarthritis, right knee.   PLAN:  Total knee arthroplasty, right knee, after medical clearance for her  hypertension.   PRIMARY CARE PHYSICIAN:  Juluis Mire,  M.D.   CARDIOLOGIST:  Rollene Rotunda, M.D.     Jaquelyn Bitter. Chabon, P.A.                   Erasmo Leventhal, M.D.    SJC/MEDQ  D:  09/13/2003  T:  09/13/2003  Job:  [id number]

## 2010-09-15 NOTE — Op Note (Signed)
NAMEEMREE, LOCICERO          ACCOUNT NO.:  [id number]   MEDICAL RECORD NO.:  [id number]          PATIENT TYPE:  OIB   LOCATION:  3014                         FACILITY:  [facility]   PHYSICIAN:  Payton Doughty, M.D.      DATE OF BIRTH:  11/14/1945   DATE OF PROCEDURE:  09/22/2004  DATE OF DISCHARGE:                                 OPERATIVE REPORT   PREOPERATIVE DIAGNOSIS:  Right ulnar nerve palsy.   POSTOPERATIVE DIAGNOSIS:  Right ulnar nerve palsy.   OPERATIVE PROCEDURE:  Right ulnar nerve transposition.   SURGEON:  Payton Doughty, M.D.   SERVICE:  Neurosurgery   ANESTHESIA:  General endotracheal anesthesia.   COMPLICATIONS:  None.   ASSISTANT:  [place]   BODY OF TEXT:  65 year old right handed white lady with an ulnar nerve palsy  on the right side.  She was taken to the operating room, smoothly  anesthetized, and intubated.  She was placed supine on the operating room  with the right arm extended.  Following shave, prep, and drape in the usual  sterile fashion, the skin was incised at the level of the median epicondyle  and carried forward to within 5 cm of the epicondyle, then laterally to the  biceps tendon and then back medially distal to the epicondyle approximately  5 cm, then 5 cm down the arm.  This flap with its abundant subcutaneous fat  was dissected free to reveal the median intermuscular septum, the ulnar  groove, and the flexor carpi ulnaris muscle.  Starting at the medial  muscular septum, the ulnar nerve was dissected free, followed through the  groove.  When the nerve had first been isolated in the medial intermuscular  septum, it could only be stimulated at 2 milliamp current, the 1 milliamp  and 0.5 milliamp got no response.  This nerve was dissected free and its  branches freed up distal to the ulnar groove.  It was transposed over to the  lateral aspect of the median epicondyle.  After decompression, the nerve  could be readily stimulated with great vigor  at only 0.5 milliamp current.  A 3-0 silk was then used to tack the skin down over the transposition site.  The remaining skin was closed with successive layers of 2-0 Vicryl and 3-0  nylon in simple fashion.  Betadine and Telfa dressing was applied and made  occlusive with OpSite and the patient returned to the recovery room in good  condition.      MWR/MEDQ  D:  09/22/2004  T:  09/22/2004  Job:  [id number]

## 2010-09-15 NOTE — H&P (Signed)
[name], [name]          ACCOUNT NO.:  [id number]   MEDICAL RECORD NO.:  [id number]          PATIENT TYPE:  OIB   LOCATION:  2899                         FACILITY:  [facility]   PHYSICIAN:  Payton Doughty, M.D.      DATE OF BIRTH:  1945-10-11   DATE OF ADMISSION:  09/22/2004  DATE OF DISCHARGE:                                HISTORY & PHYSICAL   ADMISSION DIAGNOSIS:  Ulnar nerve palsy on the right.   This is a now 65 year old right handed white lady who has had anterior  cervical decompression and fusion at C5-6 and C6-7. She had difficulty with  her hand and had a diskectomy at C7-T1 posteriorly. She has also had  placement of screws at that level. She had done well for several months and  had increasing pain down her right arm, and an EMG and nerve conduction  study demonstrated tardy ulnar nerve palsy on the right, and she is admitted  for ulnar nerve transposition.   PAST MEDICAL HISTORY:  1.  Remarkable for heart failure.  2.  Adult-onset diabetes.   MEDICATIONS:  She takes aspirin a day, Cozaar one a day, glipizide, Klor-  Con, simvastatin, Synthroid, Mobic, Toprol, and glucosamine.   ALLERGIES:  PENICILLIN, CODEINE, and MORPHINE.   PAST SURGICAL HISTORY:  1.  Anterior cervical decompression as above.  2.  Knee operation by Dr. Thomasena Edis.  3.  Heel spurs by Dr. Montez Morita.   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   FAMILY HISTORY:  Parents are deceased; history is not given.   REVIEW OF SYSTEMS:  Remarkable for glasses, nasal congestion, mouth sores,  hypertension, heart murmur, back pain, pain from arthritis, neck pain,  diabetes, thyroid disease.   PHYSICAL EXAMINATION:  HEENT:  Within normal limits. She has limited range  of motion in her neck.  CHEST:  Clear.  CARDIAC:  Regular rate and rhythm.  ABDOMEN:  Nontender. No hepatosplenomegaly.  EXTREMITIES:  Without clubbing or cyanosis. Peripheral pulses were good.  GENITOURINARY:   Deferred.  NEUROLOGICAL:  She is awake, alert, and oriented. Cranial nerves were  intact. Motor exam showed 5/5 strength throughout the left upper extremity,  the right upper extremity, deltoid, biceps, and triceps were full strength.  She has some weakness in her ossei and the abductor digiti minimi on the  right.   STUDIES:  EMG and nerve conduction study results were reviewed above.   CLINICAL IMPRESSION:  Tardy right ulnar nerve palsy with positive Tinel's.   PLAN:  The plan is for a right ulnar transposition. The risks and benefits  of this approach have been discussed with her, and she wishes to proceed.      MWR/MEDQ  D:  09/22/2004  T:  09/22/2004  Job:  [id number]

## 2010-09-15 NOTE — Assessment & Plan Note (Signed)
[facility]                            CARDIOLOGY OFFICE NOTE   NAME:Bagent, [name]                 MRN:          [id number]  DATE:05/28/2006                            DOB:          1946/04/08    PRIMARY:  Celso Amy P.A.   REASON FOR PRESENTATION:  The patient with difficult to control  hypertension.   HISTORY OF PRESENT ILLNESS:  The patient returns for followup of above.  Since I last saw her, she has been diagnosed with sleep apnea and has  seen Dr. Shelle Iron.  She is working on wearing CPAP.  She has not had any  weight loss.  Her blood pressures remain elevated.  She takes them at  home and reports that her systolics are in the 160s. She continues to  have episodic dyspnea.  This is a chronic problem.  Some days are better  than others.  This is non-acute and there is no PND or orthopnea.  She  has not been having chest pain, neck discomfort, arm discomfort,  activity induced nausea or vomiting, excess diaphoresed.  She has no  palpitation, presyncope or syncope.  She says she does not sleep well at  night.  She is fatigued during the day.   PAST MEDICAL HISTORY:  Nonobstructive coronary disease (40% LAD  stenosed).  A well preserved ejection fraction, dyslipidemia,  degenerative joint disease, diabetes mellitus, hypertension,  hypothyroidism, hepatic lesion (benign and followed by CT scan), chronic  back pain, obesity, right total knee replacement, 2 anterior cervical  diskectomies, cervical fusion, right carpal tunnel release, left carpal  tunnel release.   ALLERGIES:  PENICILLIN, CODEINE and DEMEROL.   CURRENT MEDICATIONS:  1. Simvastatin 80 mg daily.  2. Aspirin 81 mg daily.  3. Furosemide 40 mg daily.  4. Mobic 7.5 mg daily.  5. Glipizide ER 5 mg daily.  6. Cozaar 100 mg daily.  7. Lisinopril 40 mg daily.  8. Potassium 20 mEq daily.  9. Synthroid 175 mcg daily.  10.Amlodipine 10 mg daily.  11.Fish oil.  12.AcipHex 20  mg daily.  13.Clonidine 0.2 mg b.i.d.  14.Metoprolol 25 mg b.i.d.   REVIEW OF SYSTEMS:  As stated in the HPI, otherwise negative for other  systems.   PHYSICAL EXAMINATION:  The patient is in no distress.  Blood pressure  162/92, heart rate 76 and regular. Weight 230 pounds, body mass index  44.  HEENT:  Eyes unremarkable, pupils equal, round and reactive to light,  fundi not visualized, oral mucosa unremarkable.  NECK:  No jugular venous distention 45%.  Carotid upstroke brisk and  symmetric.  No bruits, no thyromegaly.  LYMPHATICS:  No cervical, axillary, inguinal adenopathy.  LUNGS:  Clear to auscultation bilaterally.  BACK:  No costovertebral angle tenderness.  CHEST:  Unremarkable.  HEART:  PMI not displaced or sustained.  S1, S2 within normal limits.  No S3, no S4.  No murmurs.  ABDOMEN:  Obese,  positive bowel sounds, normal in frequency.  No pitch,  no bruits, no rebound, no guarding, no midline pulsatile mass.  No  hepatosplenomegaly, splenomegaly  SKIN:  No rashes denies.  EXTREMITIES:  2+ pulses, no edema, no cyanosis, no clubbing.  NEURO:  Oriented to person, place and time.  Cranial nerves II through  XII grossly intact, motor grossly intact.   EKG:  Sinus rhythm, rate 76, low voltage in the limb and chest leads,  poor anterior R wave progression, no acute ST-T wave changes.   ASSESSMENT AND PLAN:  1. Hypertension.  The blood pressure is still difficult to control.  I      am going to check a cortisol level given her body habitus.  I do      not see that one has been ordered before.  I am going to start      spironolactone.  I will have her stop her potassium, as she is on      both an ACE and an ARB.  She is going to get a baseline BMET today.      She is going to get a follow up BMET in 1 week.  She will get      another one in 1 month and when I see her back in 3 months.  We      maybe able to start to wean down other medicines if spironolactone      works,  which it often does in refractory hypertension.  We did      discuss the hyperkalemia.  2. Hypothyroidism.  I did check a T3, T4 in December and they were      fine.  3. Dyslipidemia.  She requested a lipid profile.  Since she is coming      back for blood work, I will have her come fasting in 1 month and we      will get a lipid profile with liver enzymes.  I will share this      with Celso Amy.  4. Obesity.  She understands the need to lose weight with diet and      exercise.  5. Fatigue.  I have asked her to discuss her insomnia with Dr. Shelle Iron      when she sees him next month.  6. Followup.  Would like to see her in 3 months or sooner if needed.     Leah Rotunda, MD, [facility]  Electronically Signed    JH/MedQ  DD: 05/28/2006  DT: 05/28/2006  Job #: [id number]

## 2010-09-15 NOTE — Assessment & Plan Note (Signed)
[facility]                            CARDIOLOGY OFFICE NOTE   NAME:[name], DONNELLE RUBEY                 MRN:          [id number]  DATE:08/06/2006                            DOB:          [birth year]    PRIMARY CARE PHYSICIAN:  Dr. Celso Amy PA   REASON FOR PRESENTATION:  The patient had difficulty to control  hypertension.   HISTORY OF PRESENT ILLNESS:  The patient presents for followup of the  above.  She is seeing Dr. Shelle Iron and is having her CPAP managed by him.  She is also apparently going to have surgery for management of CPAP  soon, although I do not know the details of this.  She has been taking  the medications as listed and is having no problems with this.  She has  had routine blood work.  Her blood pressures have been averaging in the  130-140 range with diastolics of 70-80.  She is having no chest  discomfort.  She has no shortness of breath.  She denies any PND or  orthopnea.   PAST MEDICAL HISTORY:  1. Nonobstructive coronary artery disease (40% LAD stenosis).  2. Well-preserved ejection fraction of dyslipidemia.  3. General joint disease.  4. Diabetes mellitus.  5. Hypertension.  6. Hypothyroidism.  7. Hepatic lesion (benign and followed by CT scan).  8. Chronic back pain.  9. Obesity.  10.Right total knee replacement.  11.Two anterior cervical discectomies.  12.Cervical fusion.  13.Right carpal tunnel release.  14.Left carpal tunnel release.   ALLERGIES:  PENICILLIN, CODEINE AND DEMEROL.   MEDICATIONS:  1. Simvastatin 80 mg daily.  2. Aspirin 81 mg daily.  3. Furosemide 40 mg daily.  4. Mobic 7.5 mg daily.  5. Cozaar 100 mg daily.  6. Lisinopril 40 mg daily.  7. Amlodipine 10 mg daily.  8. Fish oil.  9. Aciphex.  10.Clonidine 0.2 mg b.i.d..  11.Metoprolol 25 mg b.i.d.  12.Spironolactone 25 mg daily.  13.Synthroid 137.5 mcg daily.  14.Glipizide 10 mg daily.   REVIEW OF SYSTEMS:  As stated in the HPI,  otherwise negative other  systems.   PHYSICAL EXAMINATION:  The patient is in no distress.  Blood pressure  131/77, heart rate 52 and regular, weight 224 pounds, body mass index  43.  NECK:  No jugular venous distension 45 degrees.  Carotid upstroke  bruits, symmetric no bruits, thyromegaly.  LYMPHATICS:  No adenopathy.  LUNGS:  Clear to auscultation bilaterally.  BACK:  No costovertebral angle tenderness.  CHEST:  Unremarkable.  HEART:  PMI not displaced with sustained, S1 and S2 within normal  limits, no S3, no S4, no clicks, no rubs, no murmurs.  ABDOMEN:  Obese.  Positive bowel sounds normal in frequency pitch, no  bruits, no rebound,  no guarding, no midline pulsatile mass, no  organomegaly.  SKIN:  No rashes and no nodules  EXTREMITIES:  2+ pulses, no edema.   ASSESSMENT AND PLAN:  1. Hypertension:  Blood pressure is finally well controlled though it      is on a myriad of drugs.  At this point, she will continue these.  She understands the importance of getting her chemistry checked      about three times a year on her current regimen.  Perhaps when she      gets her sleep apnea well treated, we might be able to back off on      things.  Weight loss would help as well, and she understands this.      No further cardiovascular testing is suggested at this point.  2. Follow up.  I can see her back yearly or as needed.  She has close      followup by her primary.     Rollene Rotunda, MD, [facility]  Electronically Signed    JH/MedQ  DD: 08/06/2006  DT: 08/06/2006  Job #: [id number]   cc:   Celso Amy

## 2010-09-15 NOTE — Discharge Summary (Signed)
NAMECASIMIRA, SUTPHIN          ACCOUNT NO.:  [id number]   MEDICAL RECORD NO.:  [id number]          PATIENT TYPE:  INP   LOCATION:  0484                         FACILITY:  [facility]   PHYSICIAN:  Erasmo Leventhal, M.D.DATE OF BIRTH:  [birth year]   DATE OF ADMISSION:  05/24/2004  DATE OF DISCHARGE:  05/28/2004                                 DISCHARGE SUMMARY   ADMISSION DIAGNOSIS:  End-stage osteoarthritis, left knee.   DISCHARGE DIAGNOSIS:  End-stage osteoarthritis, left knee.   OPERATION/PROCEDURE:  Total knee arthroplasty, left knee.   BRIEF HISTORY:  This 65 year old female, well known to [place] with history of  bilateral end-stage osteoarthritis of the knees who has undergone total knee  arthroplasty of the right knee without difficulty.  She has failed  conservative management of her left knee and is now scheduled for total knee  arthroplasty of the left knee.  The surgery, risks, benefits and aftercare  were discussed in detail with the patient and questions invited and  answered.   LABORATORY DATA:  Admission CBC showed hemoglobin 10.7, hematocrit 34.  Her  hemoglobin reached a low of 8.2 and hematocrit 25.5 on January 26 and she  was transfused two units of packed cells which brought her back up to 10.8  and 33.4.  She was at 9.6 and 29.6 at discharge.  She ran a mildly elevated  white count on January 27 of 13.2 and was back down to 10.6 on January 28.  PT/PTT within normal limits. By discharge, PT was 14.8 with INR of 1.2.  Her  BMET was within normal limits on admission with the exception of elevated  glucose.  These were mildly elevated throughout admission.  Otherwise she  had mildly low calcium.  TSH was 0.006 and iron studies showed low total  iron and low iron saturation.  Urinalysis normal.   HOSPITAL COURSE:  The patient tolerated the operative procedure well.  First  postoperative day, vital signs were stable.  She was afebrile.  She was  tachycardic at 108.   CBG was 136.  Oxygen saturation 95 on 2 L.  I&Os were  good.  Hemoglobin and hematocrit 8.2 and 25.5.  BMET showed a glucose  elevated at 139 and calcium at 8.1.  Lungs were clear. Heart sounds normal.  Bowel sounds active.  Dressing was dry.  Calves were negative.  Drain was  removed without difficulty.  She was transfused two units of packed cells.  The second postoperative day, vital signs were stable. Temperature 99.2.  Oxygen saturations were good.  Hemoglobin and hematocrit were back up.  Lungs were clear.  Heart sounds normal but tachycardic.  Bowel sounds were  sluggish. Calves were negative.  Dressing was changed, wound was benign.  IV  was switched to [facility] and rehabilitation was continued.  The third  postoperative day, vital signs were stable.  Wound was benign.  Temperature  to a maximum of 99.2.  Hemoglobin and hematocrit were acceptable and the  patient continued rehabilitation with plans for discharge the following day.  The patient was noted to have a very low TSH and this will be followed  up on  an outpatient basis with Dr. Tiburcio Pea or Dr. Antoine Poche.  The hospitalists will  manage that.  On the fourth postoperative day, with the patient in improved  condition with the wound benign, hemoglobin and hematocrit stable, calves  negative, and the patient doing well, she was discharged home for followup  in the office.   CONDITION ON DISCHARGE:  Improved.   DISCHARGE MEDICATIONS:  1.  Percocet one to two q.4h. p.r.n. pain.  2.  Robaxin one q.8h. p.r.n. spasm.  3.  Trinsicon one b.i.d. for anemia.  4.  Coumadin per pharmacy protocol.  5.  Xanax 0.5 mg one every eight hours as needed for anxiety.  6.  Continue home medications except discontinue the lisinopril which was      causing the cough.   FOLLOW UP:  She is to follow up with her medical doctor on discharge to  follow up her thyroid studies. She is to see [place] back in 10 days or sooner  p.r.n. if problems.      SJC/MEDQ   D:  06/29/2004  T:  06/30/2004  Job:  [id number]

## 2010-09-15 NOTE — Consult Note (Signed)
[name], Obrien          ACCOUNT NO.:  [id number]   MEDICAL RECORD NO.:  [id number]          PATIENT TYPE:  INP   LOCATION:  0007                         FACILITY:  [facility]   PHYSICIAN:  Mobolaji B. Bakare, M.D.DATE OF BIRTH:  23-May-1945   DATE OF CONSULTATION:  05/24/2004  DATE OF DISCHARGE:                                   CONSULTATION   PRIMARY CARE PHYSICIAN:  Dr. Tiburcio Pea.   CARDIOLOGIST:  Rollene Rotunda, M.D.   REFERRING PHYSICIAN:  Erasmo Leventhal, M.D.   REASON FOR CONSULTATION:  Postoperative management of diabetes and  hypertension.   HISTORY OF PRESENT ILLNESS:  [name] is a 65 year old  Caucasian female with history of type 2 diabetes, hypertension, obesity,  anxiety, hyperlipidemia, hypothyroidism.  She came in to the hospital today  for an elective left total knee arthroplasty.  She is status post the  procedure and [facility] hospitalist is called for postop management of her  medical issues.   The patient is currently drowsy from anesthesia.  Most of the history is  obtained from son, Nydia Ytuarte, and old medical record.  Of note is that  she has had multiple surgeries in the past and she has not had any  complications during postop period.  She last had a physical about a year  ago with her primary care doctor.   REVIEW OF SYSTEMS:  Unobtainable at this time.   Blood glucose preop was 106 and postop was 126 mg/dL.  Currently she is  hemodynamically stable.   PAST MEDICAL HISTORY:  1.  Type 2 diabetes.  2.  Hypertension.  3.  Hypercholesterolemia.  4.  Hypothyroidism.  5.  Anxiety.  6.  Degenerative joint disease.  7.  Arthritis.  8.  Cardiac catheterization in July 2005 which is part of a clinical study,      ASTEROID.  At that time, she was found to have an ejection fraction of      60% and ventriculogram showed hypokinesis at the inferobasilar wall.  In      addition, there was 40% narrowing of left anterior  descending coronary      artery.  This all seems to be stable and not requiring medical      intervention.   PAST SURGICAL HISTORY:  1.  Right total knee replacement.  2.  Two anterior cervical diskectomies.  3.  Cervical fusion.  4.  Right carpal tunnel release.  5.  Left carpal tunnel release.  6.  Recent excision of fibrous mastopathy in December 2005 which was found      to be benign fibrous mastopathy.   MEDICATIONS AT HOME:  1.  Lisinopril 20 mg p.o. q.d.  2.  Crestor 40 mg once a day.  3.  Klor-Con M20 tabs 20 mEq p.o. q.d.  4.  Mobic 7.5 mg p.o. q.d.  5.  Furosemide 40 mg p.o. q.d.  6.  Toprol XL 50 mg p.o. q.d.  7.  Cozaar 100 mg p.o. q.d.  8.  Synthroid 0.125 mg p.o. q.d.  9.  Glipizide 5 mg p.o. q.d.  10. Alprazolam 0.5 mg p.o.  q.i.d. p.r.n.  11. Norvasc 10 mg p.o. q.a.m.   ALLERGIES:  PENICILLIN, MORPHINE, CODEINE - all of these give her itching.   FAMILY HISTORY:  The patient is married and lives with her husband.  She has  children in the area.   SOCIAL HISTORY:  She is pretty active at home and she is about to retire  from her current job, per her son.  She does not smoke cigarettes nor drink  alcohol.   PHYSICAL EXAMINATION:  VITAL SIGNS:  Blood pressure 164/63, heart rate 85,  normal sinus rhythm, O2 saturations 99% on face mask.  GENERAL:  On examination, the patient is awake and communicating.  HEENT:  Head normocephalic, atraumatic.  Pupils equal, not pale, not  icteric.  NECK:  No carotid bruits.  No palpable thyromegaly.  LUNGS:  Clear breath sounds, fairly good air entry bilaterally.  CARDIOVASCULAR:  S1 and S2, regular.  ABDOMEN:  Obese, soft, nontender.  Bowel sounds present.  No audible  holosystolic murmur.  EXTREMITIES:  Dorsalis pedis pulses palpable bilaterally 2+.  Trace pedal  edema.  MUSCULOSKELETAL:  Surgical scar over right knee.  Left knee and lower leg in  support dressing.  CENTRAL NERVOUS SYSTEM:  Not examined at this time.    LABORATORY DATA:  No laboratory data for today.  However, blood drawn preop  on January 20 as follows:  Sodium 142, potassium 3.8, chloride 110, bicarb  27, BUN 13, creatinine 0.7, glucose 120, bilirubin 0.5, alkaline phosphatase  122.  Of note is that alkaline phosphatase was 87 in October 2005.  AST 31,  ALT 19, total protein 7.3, albumin 3.5, calcium 8.7.  White cells 9.6,  hemoglobin 10.7, hematocrit 34, platelets 326.  In October 2005, hemoglobin  was 11.9 and hematocrit 35.  MCV now 73.8.  MCV in October 2005 was 84.  Urinalysis was insignificant.   ASSESSMENT:  1.  Type 2 diabetes mellitus.  2.  Hypertension.  3.  Anxiety.  4.  Obesity.  5.  Hyperlipidemia.  6.  Hypothyroidism.   PLAN:  1.  Will resume all medications and optimize medications as needed.  2.  Will start on sliding scale insulin with NovoLog using insulin-sensitive      parameters and check q.6h.  Then it can be changed to q.a.c. when the      patient starts to eat.  3.  Hold Lasix for now in the immediate postop period.  Also hold Klor-Con.      The patient has no history of CHF as per son.  She is using furosemide      for fluid retention.  EF on cardiac catheterization July 2005 was 60%.  4.  Elevated alkaline phosphatase.  This is mildly elevated.  Will repeat      CMET in the morning and check gamma glutamyl transpeptidase to determine      the origin of this elevated alkaline phosphatase.  5.  Microcytic anemia.  Check stool OP and iron ferritin TIBC transferrin      saturation, vitamin B12 and folate.  6.  History of coronary artery disease with 40% narrowing of left anterior      descending artery.  This seems to be stable at this time.   Thank you for the consult.  We will follow with you.      MBB/MEDQ  D:  05/24/2004  T:  05/24/2004  Job:  [id number]   cc:   Erasmo Leventhal, M.D.  Chief of Staff  [address]  [address]  [place]  [place] [address] Fax: [phone]   Dr. Tiburcio Pea,  Billy Fischer, M.D.

## 2010-09-15 NOTE — H&P (Signed)
NAME:  [name], [name]                    ACCOUNT NO.:  [id number]   MEDICAL RECORD NO.:  [id number]                   PATIENT TYPE:  INP   LOCATION:  3010                                 FACILITY:  [facility]   PHYSICIAN:  Payton Doughty, M.D.                   DATE OF BIRTH:  [birth year]   DATE OF ADMISSION:  05/07/2003  DATE OF DISCHARGE:                                HISTORY & PHYSICAL   ADMISSION DIAGNOSIS:  Herniated disc at C6-C7.   A very nice now 65 year old right-handed white lady who was operated on 10  years ago for a C5-C6 that has done well and starting a few months ago,  developed increasing pain in the neck out toward her right shoulder.  MRI  demonstrated disc disease C6-7 and solid fusion at 5-6.  Pain is mostly in  her right shoulder, does not have a lot of numbness in the right arm, gets  interscapular pain and pain into the scapula.   PAST MEDICAL HISTORY:  1. Congestive heart failure.  2. Adult onset diabetes.   MEDICATIONS:  She takes an aspirin a day, Cozaar once a day, Glipizide  standard release once a day, Klor-Con daily, Rosuvastatin for cholesterol,  Synthroid once a day, Mobic once a day, Toprol XL once a day, Lasix once a  day, glucosamine chondroitin twice a day.   ALLERGIES:  1. PENICILLIN.  2. CODEINE.  3. MORPHINE.   PAST SURGICAL HISTORY:  1. Anterior cervical in 1994.  2. Three knee operations by Dr. Thomasena Edis in 1997.  3. Heel spurs by Dr. Montez Morita in 1998.   SOCIAL HISTORY:  She does not smoke, does not drink.  She works for [employer].   FAMILY HISTORY:  Parents are deceased.  Histories are not given.   REVIEW OF SYMPTOMS:  Remarkable for glasses, nasal congestion, mouth sores,  hypertension, heart murmur, back pain, arm pain, arthritis, neck pain,  diabetes and thyroid disease.   PHYSICAL EXAMINATION:  HEENT:  Within normal limits.  NECK:  She has reasonable range of motion of her neck with some upper  extremity  shoulder pain.  CHEST:  Clear.  CARDIOVASCULAR:  Regular rate and rhythm with 2/6 holosystolic murmur.  ABDOMEN:  Nontender without hepatosplenomegaly.  EXTREMITIES:  Without clubbing or cyanosis.  GU:  Deferred.  Peripheral pulses are good.  NEUROLOGIC:  She is awake, alert and oriented.  Cranial nerves are intact.  Motor examination showed 5/5 strength throughout the upper and lower  extremities save for the right triceps which is 5-/5 and left triceps which  is 4/5.  There is no current sensory deficit.  Reflexes are 1 at the biceps,  absent at the triceps bilaterally, lower extremities are not myelopathic.   Plain films and MRI demonstrated this C6-C7 slightly centric to the left  with biforaminal narrowing at that level.   CLINICAL IMPRESSION:  Bilateral C7 radiculopathy secondary to  herniated disc  at C6-7.  She has tried an epidural steroid injection with no avail.   PLAN:  Anterior discectomy and fusion at C6-7 and cementing of the tethered  plate.  The risks and benefits of this approach have been discussed with her  and she wished to proceed.                                                Payton Doughty, M.D.    MWR/MEDQ  D:  05/07/2003  T:  05/07/2003  Job:  [id number]

## 2010-09-15 NOTE — Op Note (Signed)
NAMEOBERIA, BEAUDOIN          ACCOUNT NO.:  [id number]   MEDICAL RECORD NO.:  [id number]          PATIENT TYPE:  OBV   LOCATION:  2550                         FACILITY:  [facility]   PHYSICIAN:  Antony Contras, MD     DATE OF BIRTH:  1946/04/07   DATE OF PROCEDURE:  08/15/2006  DATE OF DISCHARGE:                               OPERATIVE REPORT   PREOPERATIVE DIAGNOSES:  1. Mild obstructive sleep apnea  2. Septal deviation.  3. Inferior turbinate hypertrophy.   POSTOPERATIVE DIAGNOSES:  1. Mild obstructive sleep apnea  2. Septal deviation.  3. Inferior turbinate hypertrophy.   OPERATION/PROCEDURE:  1. Uvulopalatopharyngoplasty.  2. Nasal septoplasty.  3. Bilateral inferior turbinate SMR.  4. Submucous resection.   SURGEON:  Antony Contras, M.D.   ANESTHESIA:  General endotracheal anesthesia.   COMPLICATIONS:  None.   INDICATIONS:  The patient is a 65 year old white female who has mild  obstructive sleep apnea with an RDI of 12 and the minimal oxygen  saturation 86%.  She was tried on CPAP since January but feels  claustrophobic and feels like she is being smothered and cannot tolerate  the mask.  Thus, she is brought to the operating room for surgical  management.   FINDINGS:  The septum deviates to the right significantly in the  inferior region because of a large spur.  Turbinates are moderately  enlarged, particularly on the left side.  The patient has had a  tonsillectomy but has a fairly long soft palate.   DESCRIPTION OF PROCEDURE:  The patient is identified in the holding room  and informed consent having been obtained, the patient is moved to the  operating suite and placed on the operating table in the supine  position.  Anesthesia was induced.  The patient was intubated by  anesthesia team without difficulty.  The patient given intravenous  antibiotics and steroids during the case.  The eyes were taped closed  and the bed was turned 90 degrees from  anesthesia.  The face was prepped  and draped in semi-sterile fashion.  A Crowe-Davis retractor was then  inserted the mouth and opened to reveal the oropharynx.  This was placed  in suspension on the Mayo stand.  The soft palate was then injected with  1% lidocaine with 1:100,000 epinephrine.  Prior to induction of  anesthesia, the soft palate was examined while the patient was awake and  estimation was made as to the level of resection that would  conservatively leave the musculature place necessary to close with the  oropharynx.  Using this estimation, at this point an incision was made  across the soft palate in an arched  fashion using the cut on Bovie  electrocautery.  Back cuts were made at each corner of the soft palate.  The soft palate was then incised in a scythes fashion, moving inferiorly  as the resection moved posteriorly.  Some of the soft tissue within the  soft palate was removed in addition.  Bleeding was controlled with Bovie  electrocautery.  Back cuts were extended part way through the palate.  The posterior mucosal edge was  then secured to the anterior mucosal edge  using 3-0 Monocryl in a simple running fashion.  The throat was then  suctioned.  During work in the throat, Afrin pledgets were placed in the  nose.  At this point the pledgets were removed and the septum was  injected with 1% lidocaine with 1:100,000 epinephrine.  The Killian  incision was made on the right side and the subperichondrial flap was  elevated down right side.  Elevating around the large spur, the mucosa  was torn.  Cartilage was then divided using a blunt instrument above the  spur and is also along the spur and the cartilage was removed with  forceps.  The remaining bony spur was then exposed using the Market researcher.  An osteotome was used to make a cut through the bone  anteriorly and soft tissues were elevated off the other side.  The spur  was then was loosened from the inferior  connection using Takahashi  forceps.  The spur was then removed.  A hole in the opposite flap was  made in this process.  It was slightly higher than the right-sided hole.  At this point, the nose was suctioned and the septum was found to be  adequately medianized.  The inferior turbinates were then injected with  1% lidocaine with 1:100,000 epinephrine.  Stab incisions were  made at  the anterior head of the turbinates and soft tissues were elevated using  Therapist, nutritional.  A straight microdebrider was then placed within the  pocket and used to remove underlying soft tissue off the bone and off  the overlying mucosa.  The soft tissues were then laid back down and the  nose was suctioned.  Doyle stents  coated in bacitracin ointment were  then placed in both sides of the nose and pushed posteriorly.  The  Killian incision was closed 4-0 chromic in a simple interrupted fashion.  The stents were then pulled into position and secured at the anterior  septum using 2-0 chromic in a single mattress stitch.  After this, the  nose and throat were irrigated and suctioned and the patient was then  turned back to anesthesia for wake-up.  She was extubated and moved to  the recovery room in stable condition.      Antony Contras, MD  Electronically Signed     DDB/MEDQ  D:  08/15/2006  T:  08/15/2006  Job:  [id number]

## 2010-09-15 NOTE — Op Note (Signed)
NAME:  [name], [name]                    ACCOUNT NO.:  [id number]   MEDICAL RECORD NO.:  [id number]                   PATIENT TYPE:  INP   LOCATION:  0456                                 FACILITY:  [facility]   PHYSICIAN:  Erasmo Leventhal, M.D.         DATE OF BIRTH:  1946/02/07   DATE OF PROCEDURE:  09/24/2003  DATE OF DISCHARGE:                                 OPERATIVE REPORT   PREOPERATIVE DIAGNOSIS:  Right knee end-stage osteoarthritis.   POSTOPERATIVE DIAGNOSIS:  Right knee end-stage osteoarthritis.   PROCEDURE:  Right total knee arthroplasty.   SURGEON:  Erasmo Leventhal, M.D.   ASSISTANT:  Chapman Moss, PA-C   ANESTHESIA:  General with femoral nerve block.   ESTIMATED BLOOD LOSS:  Less than 50 cc.   DRAINS:  Two medium Hemovac.   COMPLICATIONS:  None.   TOURNIQUET TIME:  An hour and 25 minutes at 350 mmHg.   DISPOSITION:  To PACU stable.   OPERATIVE IMPLANTS:  Osteonics component, Scorpion.  Size 5 femur.  Size 5  tibia.  A 15 mm flex.  Posterior stabilized tibial insert.  A 26 mm all  polyethylene recess patella.  All cemented.   OPERATIVE DETAILS:  Patient was counseled in the holding area.  The correct  side was identified.  The IV was started.  Antibiotics were given.  The  chart was reviewed and signed appropriately.  Taken to the operating room  and placed in supine position.  General anesthesia.  The Foley catheter was  placed, utilizing sterile technique by the OR circulating nurse.  The urine  had a normal appearance.  The right lower extremity was examined.  She was  in marked varus with 5 degree flexion and contraction with flexion to 125  degrees.  She was elevated and prepped and draped in a sterile fashion.  Exsanguinated and esmarched.  The tourniquet was inflated to 350 mmHg.   A straight midline incision was made through the skin and subcutaneous  tissue.  Mediolateral soft tissue flaps were developed at the appropriate  level, and  a medial arthrotomy was performed.  Soft tissue was released  under the proximal medial tibia with varus malalignment.  The patella was  everted, and the knee was flexed.  End-stage arthritic change.  Bone-against-  bone contact.  The cruciate ligaments were resected.  __________  femur.  The canal was irrigated.  Effluent was clear.  __________  were in place.  We chose a 5 degree valgus cut and took a 10 mm cut off the distal femur.  The distal femur was  found to be a size #5.  Rotational marks were made.  The distal femur was cut to fit a size 5.  Tibial end was resected.  Medial  and lateral menisci were removed.  The geniculate vessels were coagulated.  Posterior neurovascular structures were sought out and protected throughout  the entire case.  The proximal tibia was found to be a  size 5.  A starter  hole was raised.  The stripper was utilized.  The canal was irrigated,  __________ clear and was gently placed.  I chose a 0 degree slope with a 10  mm cut, based upon the lateral side, which was the less involved side.  This  took [place] to the defect on the medial side with no extra bone at all.  Posterior medial and posterior osteophytes were removed under direct vision  very carefully, and the __________  was repaired in the standard fashion.  At this time, it was sized to a 5 femur and sized to a 5 tibia.  The 12 mm  flexed insert with excellent range of motion and soft tissue balance.  Flexion, extension, and rotational marks were made.  A Delta keel was formed  in a standard fashion.  The patella was found to be a size 26.  It was  reamed down to the appropriate depth.  Locking holes were made, and excess  bone was removed.  Utilizing moderate cement technique, all components were  cemented into place, a size 5 tibia, size 5 femur, 26 patella.  The cement  was cured.  I did a 12 and a 15 mm trial insert with a 15 mm trial insert at  this time.  We had excellent range of motion and  flexion and extension.  She  was well balanced on flexion and extension and did not require lateral  obliques.  The trial was removed.  The bone wax was placed on the exposed  bony surfaces.  All components were checked, and a 15 mm flexed tibial  insert was then tapped into placed.  Again, excellent range of motion and  soft tissue balance and flexion and extension.  All wounds are copiously  irrigated, including the joint, with pulsatile lavage again at this time,  and also antibiotic solution.  Two medium Hemovac drains were placed.  Sequential closure of the layers was done.  Arthrotomy Vicryl, subcu Vicryl,  and the skin closed with subcuticular Monocryl sutures.  Steri-Strips were  applied.  Xeroform around the drains.  Sterile compressive dressing was  applied to the knee.  The tourniquet was deflated.  She tolerated the  procedure well with no complications.  Sponge and needle counts were  correct.  Gave another gram of Ancef intravenously.  Exam showed excellent  pulse and circulation in the foot and ankle at the end of the case.   Prior to letting down the tourniquet, 20 cc of 0.25% Marcaine with  epinephrine was placed into the knee joint after conferring with nurse  anesthesist, who will be attending the case for the block.   Patient remained on the OR table for femoral nerve block.  She was then  awakened and taken to from the operating room stable with no surgical  complications or problems.  Sponge and needle count were correct.   ADDENDUM:  To decrease surgical time, it was felt throughout the entire  procedure, Mr. Brett Canales Chapin's assistance was needed.                                               Erasmo Leventhal, M.D.    RAC/MEDQ  D:  09/24/2003  T:  09/25/2003  Job:  [id number]

## 2010-09-15 NOTE — Assessment & Plan Note (Signed)
[facility]                              CARDIOLOGY OFFICE NOTE   NAME:[name], [name]                 MRN:          [id number]  DATE:03/18/2006                            DOB:          Dec 18, 1945    PRIMARY CARE PHYSICIAN:  Celso Amy, PA, [place].   REASON FOR VISIT:  Evaluate patient with difficult to control hypertension.   HISTORY OF PRESENT ILLNESS:  The patient returns for followup of the above.  I saw her for preoperative clearance in the summer and she then had her  shoulder operated on. She said that this helped and she had no problems with  the surgery. She has been seen a couple of times because of difficult to  control hypertension. She had clonidine added.   She does not take her blood pressure much but says that it is about what it  is today which is slightly elevated. She feels fatigued. She is limited by  the fatigue. She is limited by back discomfort. She does not get much  exercise. She has gained quite a bit of weight just in the last few months  apparently from inactivity. She denies any chest discomfort, neck  discomfort, arm discomfort, activity induced nausea, vomiting, or excessive  diaphoresis. She is not having any palpitations, presyncope or syncope. She  is not having any PND or orthopnea. Of note, she does snore.   PAST MEDICAL HISTORY:  Nonobstructive coronary disease (40% LAD stenosis),  well preserved ejection fraction, dyslipidemia, degenerative joint disease,  diabetes mellitus, hypertension, hypothyroidism, hepatic lesion (benign and  followed by CT scan), chronic back pain, obesity, right total knee  replacement, two anterior cervical diskectomies, cervical fusion, right  carpal tunnel release, left carpal tunnel release.   ALLERGIES:  PENICILLIN, CODEINE, DEMEROL.   MEDICATIONS:  1. Paroxetine.  2. AcipHex 20 mg daily.  3. Fish oil.  4. Clonidine 0.1 mg b.i.d.  5. Amlodipine 10 mg a day.  6.  Synthroid 175 mcg daily.  7. Metoprolol 50 mg b.i.d.  8. Potassium.  9. Lisinopril 40 mg daily.  10.Cozaar 100 mg daily.  11.Glipizide 5 mg daily.  12.Mobic 7.5 mg daily.  13.Furosemide 40 mg daily.  14.Aspirin 81 mg daily.  15.Simvastatin 80 mg daily.   REVIEW OF SYSTEMS:  As stated in the HPI otherwise negative for other  systems.   PHYSICAL EXAMINATION:  GENERAL:  The patient is in no distress.  VITAL SIGNS:  Blood pressure 161/75, heart rate 48 and regular, weight 228  pounds, body mass index 44.  HEENT:  Eyes unremarkable. Pupils equal round and reactive to light. Fundi  not visualized. Oral mucosa unremarkable.  NECK:  No jugular venous distention at 45 degrees. Carotid upstroke brisk  and symmetric. No bruits, no thyromegaly.  LYMPHATICS:  No cervical, axillary or inguinal adenopathy.  LUNGS:  Clear to auscultation bilaterally.  BACK:  No costovertebral angle tenderness.  CHEST:  Unremarkable.  HEART:  PMI not displaced or sustained. S1 and S2 within normal limits. No  S3, no S4, no murmurs.  ABDOMEN:  Obese, positive bowel sounds, normal to frequency and pitch.  No  bruits, no rebound, no guarding and no midline pulsatile masses, no  hepatomegaly, no splenomegaly.  SKIN:  No rashes, no nodules.  EXTREMITIES:  2+ pulses throughout. No edema, no cyanosis or clubbing.  NEUROLOGIC:  Oriented to person, place and time. Cranial nerves II-XII  grossly intact. Motor grossly intact.   EKG sinus bradycardia. Rate 44, axis within normal limits, interval is  within normal limits, interval is within normal limits. Poor anterior R wave  progression, no acute ST wave changes.   ASSESSMENT/PLAN:  1. Hypertension. Blood pressure is still not controlled. She still has      symptoms consistent with sleep apnea and she snores. Therefore, I am      going to screen her with a sleep study as this could contribute to      difficult to control hypertension. She also needs weight loss. She  is      fatigued and she is bradycardic. I am going to cut down on her      metoprolol to 25 mg b.i.d. I am going to increase her clonidine to 0.2      mg b.i.d. but I will watch her heart rate carefully. She will continue      the other medications as listed.  2. Rule out sleep apnea as above.  3. Hypothyroidism. Her TSH was actually depressed previously. I thought      this was being followed by her primary physician but she has apparently      been out and has not had followup of this. I am going repeat a TSH and      will probably need to reduce her Synthroid.  4. Obesity. I have asked her to join the [facility] to see if she can get into      the water and do some water aerobics, walking in the water. She needs      to lose weight.  5. Followup. I am going to see her back in 2 months or sooner if needed.    Rollene Rotunda, MD, [facility]  Electronically Signed   JH/MedQ  DD: 03/18/2006  DT: 03/18/2006  Job #: [id number]   cc:   Celso Amy, PA

## 2010-09-15 NOTE — Op Note (Signed)
NAME:  [name], [name]                    ACCOUNT NO.:  [id number]   MEDICAL RECORD NO.:  [id number]                   PATIENT TYPE:  INP   LOCATION:  3172                                 FACILITY:  [facility]   PHYSICIAN:  Payton Doughty, M.D.                   DATE OF BIRTH:  08-29-1945   DATE OF PROCEDURE:  05/07/2003  DATE OF DISCHARGE:                                 OPERATIVE REPORT   PREOPERATIVE DIAGNOSIS:  Herniated disc C6-C7.   POSTOPERATIVE DIAGNOSIS:  Herniated disc C6-C7.   OPERATIVE PROCEDURE:  C6-C7 anterior cervical discectomy and fusion with a  tethered plate.   SURGEON:  Payton Doughty, M.D.   SERVICE:  Neurosurgery   ANESTHESIA:  General endotracheal anesthesia.   PREPARATION:  Betadine and alcohol wipe.   COMPLICATIONS:  CSF leak.   ASSISTANT:  Nurse assistant [facility]  Doctor assistant Hilda Lias, M.D.   BODY OF TEXT:  65 year old lady with severe cervical spondylosis below a C5-  C6 fusion.  She is taken to the operating room, smoothly anesthetized, and  intubated.  Placed supine on the operating table in the halter head traction  with the neck extended.  Following shave, prep, and drape in the usual  sterile fashion, the old skin incision was reopened at the level of the  carotid tubercle.  The platysma was identified, elevated, divided, and  undermined.  The sternocleidomastoid was identified and dissection revealed  the carotid artery and the omohyoid muscle.  Working inferiorly to the  omohyoid and medial to the sternocleidomastoid muscle, the carotid artery  was identified and retracted laterally, trachea and esophagus were  identified and retracted laterally to the right, exposing the bones of the  anterior cervical spine.  A marker was placed and interoperative x-ray was  obtained and confirmed the correct level.  Having confirmed the correct  level, discectomy was carried out at C6-C7, first under gross observation.  The operating microscope  was then brought in and we used microdissection  technique to complete removal of the disc, dissect the anterior epidural  space, and decompress the nerve roots.  On the left side, there was adhesion  of the spondylitic bone to the dura and there was a small dural tear.  An  attempt was made to repair it directly.  It was covered with thrombin  Gelfoam and stopped leaking.  Both nerve roots were completely decompressed.  A 7 mm bone graft was then fashioned with patellar allograft and tapped into  place.  A 14 mm tethered plate was then placed with 12 mm screws, two in C6  and two in C7.  Interoperative x-ray confirmed good placement of bone graft,  plate, and screws.  The wound was irrigated and hemostasis assured.  The  platysma was reapproximated with 3-0 Vicryl in  an interrupted fashion, the subcutaneous tissue were reapproximated with 3-0  Vicryl in an interrupted fashion, the skin was  closed with 4-0 Vicryl in a  running subcuticular fashion.  Benzoin and Steri-Strips were placed and made  occlusive with Telfa and OpSite and the patient returned to the recovery  room in good condition.                                               Payton Doughty, M.D.    MWR/MEDQ  D:  05/07/2003  T:  05/07/2003  Job:  [id number]

## 2010-09-15 NOTE — Op Note (Signed)
NAME:  [name], KARPOWICZ          ACCOUNT NO.:  [id number]   MEDICAL RECORD NO.:  [id number]          PATIENT TYPE:  AMB   LOCATION:  ENDO                         FACILITY:  [facility]   PHYSICIAN:  Anselmo Rod, M.D.  DATE OF BIRTH:  Feb 28, 1946   DATE OF PROCEDURE:  11/02/2005  DATE OF DISCHARGE:  11/02/2005                                 OPERATIVE REPORT   PROCEDURE:  Esophagogastroduodenoscopy with snare polypectomy x1.   ENDOSCOPIST:  Anselmo Rod, M.D.   INSTRUMENT USED:  Olympus video panendoscope.   INDICATIONS FOR PROCEDURE:  A 64 year old white female undergoing an EGD for  polypectomy seen on recent EGD.  The patient has a history of sleep apnea  and, therefore, the patient was brought to [facility] for the EGD.   PREPROCEDURE PREPARATION:  Informed consent was procured from the patient.  The patient was fasted for four hours prior to the procedure.  Risks and  benefits were discussed in great detail. She was asked to keep herself off  the Plavix prior to the procedure.   PREPROCEDURE PHYSICAL:  VITAL SIGNS:  The patient had stable vital signs.  NECK:  Supple.  CHEST:  Clear to auscultation.  CARDIAC:  S1 and S2 regular.  ABDOMEN:  Soft with normal bowel sounds.   DESCRIPTION OF PROCEDURE:  The patient was placed in the left lateral  decubitus position and sedated with 50 mcg of fentanyl and 6 mg of Versed in  slow incremental doses.  Once the patient was adequately sedated, maintained  on low-flow oxygen and continuous cardiac monitoring, the Olympus video  panendoscope was advanced through the mouth piece over the tongue, into the  esophagus under direct vision.  The entire esophagus appeared normal with no  evidence of rings, stricture, masses, esophagitis or Barrett's mucosa.  The  scope was then advanced into the stomach. A large peduncular polyp was seen  below the Z-line and was removed by a hot snare x1.  There was no bleeding  after the  polypectomy.  Mild diffuse gastritis was noted.  The proximal  small bowel appeared normal.  The tolerated the procedure well without  immediate complications.   IMPRESSION:  1.Normal appearing esophagus.  2.Large parenchymal polyp removed by hot snare from the proximal stomach.  3.Mild diffuse gastritis.  4.Normal proximal small bowel.   RECOMMENDATIONS:  1.Await pathology results.  2.Avoid all nonsteroidal including Mobic and aspirin for the next four  weeks.  3.Hold off on Plavix for the next two weeks.  4.Outpatient follow up in 2 weeks for a CBC.      Anselmo Rod, M.D.  Electronically Signed     JNM/MEDQ  D:  11/05/2005  T:  11/06/2005  Job:  [id number]   cc:   Gaetano Hawthorne. Lily Peer, M.D.  Fax: [phone]

## 2010-09-15 NOTE — Cardiovascular Report (Signed)
NAME:  [name], [name]                    ACCOUNT NO.:  [id number]   MEDICAL RECORD NO.:  [id number]                   PATIENT TYPE:  OIB   LOCATION:  2852                                 FACILITY:  [facility]   PHYSICIAN:  Charlies Constable, M.D. [facility]              DATE OF BIRTH:  1945-10-10   DATE OF PROCEDURE:  11/11/2003  DATE OF DISCHARGE:  11/11/2003                              CARDIAC CATHETERIZATION   CLINICAL HISTORY:  [name] is a 66 year old and has nonobstructive  coronary disease and was enrolled in the ASTEROID study two years ago.  She  returns today for follow-up catheterization as part of the ASTEROID  protocol.  She also has hypertension, diabetes and degenerative joint  disease.   PROCEDURE:  The procedure is performed by right femoral artery, using  arterial sheath and 6 French preformed coronary catheters.  A front wall  arterial puncture was performed, and Omnipaque contrast was used.  We had  some difficulty entering the RA and had to obtain access with a Doppler  needle.  We used a JL-4 6 [place] guiding catheter for injecting the left  coronary artery and following a diagnostic study performed IVUS on the AV  branch of the circumflex artery.  The patient was given weighted adjusted  heparin following AC greater than 200 seconds.  We passed an [employer] soft wire  down the AV circumflex vessel into the distal vessel.  We passed an IVUS  down the AV circumflex and began the run in the mid distal vessel proximal  to the posterolateral branch.  We did automatic pullback.   We closed the right femoral artery with Angioseal at the end of the  procedure.  The patient tolerated the procedure well and left the laboratory  in satisfactory condition.  Nitroglycerin was given prior to the IVUS run.   RESULTS:  The left main coronary artery is a very short vessel and is free  of significant disease.   The left anterior descending coronary artery was a small artery that  terminated before the apex.  This vessel gave rise to a diagonal branch and  a septal perforator.  There was 40% narrowing in the mid vessel.   The circumflex artery is a dominant artery that gave rise to a large ramus  branch, a large posterolateral branch and a posterior descending branch.  There was slight irregularity in the proximal circumflex artery with no  significant obstruction.   The right coronary artery is a nondominant vessel that supplied two right  ventricular branches.   The left ventriculogram performed in the RAO projection showed hypokinesis  at the inferobasal wall.  The overall wall motion was good with an estimated  ejection fraction of 60%.   The IVUS run on the circumflex artery showed a mild amount of plaque in the  proximal portion of the IVUS and left main.  The mid vessel was almost free  of plaque.  CONCLUSION:  1. Nonobstructive coronary artery disease with 40% narrowing in the mid left     anterior descending and irregularities in the dominant circumflex artery     and minimal inferobasal wall hypokinesis.  2. Intervascular ultrasound performed as part of the ASTEROID study.   RECOMMENDATIONS:  The patient returned to the holding area for further  observation.  Will plan to put the patient on Crestor 10 mg following  completion of the study.                                               Charlies Constable, M.D. [facility]    BB/MEDQ  D:  11/11/2003  T:  11/12/2003  Job:  [id number]   cc:   Juluis Mire, M.D.  [address]  [place]  [place] [address]  Fax: [phone]

## 2011-01-09 ENCOUNTER — Inpatient Hospital Stay (INDEPENDENT_AMBULATORY_CARE_PROVIDER_SITE_OTHER)
Admission: RE | Admit: 2011-01-09 | Discharge: 2011-01-09 | Disposition: A | Discharge status: Home / Self Care | Payer: Medicare Other | Source: Ambulatory Visit | Attending: Family Medicine | Admitting: Family Medicine

## 2011-01-09 DIAGNOSIS — L723 Sebaceous cyst: Secondary | ICD-10-CM

## 2011-01-11 ENCOUNTER — Inpatient Hospital Stay (HOSPITAL_COMMUNITY)
Admission: RE | Admit: 2011-01-11 | Discharge: 2011-01-11 | Disposition: A | Discharge status: Home / Self Care | Payer: Medicare Other | Source: Ambulatory Visit | Attending: Family Medicine | Admitting: Family Medicine

## 2011-01-13 LAB — CULTURE, ROUTINE-ABSCESS

## 2011-01-17 LAB — URINALYSIS, ROUTINE W REFLEX MICROSCOPIC
Ketones, ur: 15 — AB
Nitrite: NEGATIVE
Protein, ur: NEGATIVE
Urobilinogen, UA: 1

## 2011-01-17 LAB — URINE CULTURE
Colony Count: NO GROWTH
Culture: NO GROWTH

## 2011-01-17 LAB — URINE MICROSCOPIC-ADD ON

## 2011-01-22 LAB — BASIC METABOLIC PANEL
CO2: 21
CO2: 22
Calcium: 8.5
Calcium: 8.6
Creatinine, Ser: 2.22 — ABNORMAL HIGH
GFR calc Af Amer: 27 — ABNORMAL LOW
GFR calc Af Amer: 48 — ABNORMAL LOW
GFR calc non Af Amer: 40 — ABNORMAL LOW
Sodium: 142

## 2011-01-22 LAB — URINE MICROSCOPIC-ADD ON

## 2011-01-22 LAB — URINALYSIS, ROUTINE W REFLEX MICROSCOPIC
Nitrite: NEGATIVE
Specific Gravity, Urine: 1.017
Urobilinogen, UA: 1
pH: 5

## 2011-01-22 LAB — URINE CULTURE: Colony Count: >=100000

## 2011-01-22 LAB — COMPREHENSIVE METABOLIC PANEL WITH GFR
ALT: 334 — ABNORMAL HIGH
AST: 440 — ABNORMAL HIGH
CO2: 18 — ABNORMAL LOW
Calcium: 9
Creatinine, Ser: 2.94 — ABNORMAL HIGH
GFR calc Af Amer: 19 — ABNORMAL LOW
GFR calc non Af Amer: 16 — ABNORMAL LOW
Sodium: 135
Total Protein: 7.1

## 2011-01-22 LAB — CBC
Hemoglobin: 12.8
MCHC: 33.3
MCHC: 33.4
MCHC: 33.5
Platelets: 231
RBC: 4.16
RBC: 4.4
RBC: 4.84
WBC: 11.6 — ABNORMAL HIGH
WBC: 13 — ABNORMAL HIGH

## 2011-01-22 LAB — DIFFERENTIAL
Basophils Relative: 1
Lymphocytes Relative: 16
Monocytes Relative: 6
Neutro Abs: 9.9 — ABNORMAL HIGH
Neutrophils Relative %: 76

## 2011-01-22 LAB — LIPASE, BLOOD: Lipase: 29

## 2011-01-22 LAB — COMPREHENSIVE METABOLIC PANEL
Albumin: 3.6
Alkaline Phosphatase: 55
BUN: 72 — ABNORMAL HIGH
Chloride: 102
Glucose, Bld: 76
Potassium: 5.2 — ABNORMAL HIGH
Total Bilirubin: 1.5 — ABNORMAL HIGH

## 2011-01-22 LAB — I-STAT 8, (EC8 V) (CONVERTED LAB)
Acid-base deficit: 3 — ABNORMAL HIGH
Bicarbonate: 20
HCT: 44
Operator id: 288831
pCO2, Ven: 29.4 — ABNORMAL LOW

## 2011-03-12 ENCOUNTER — Other Ambulatory Visit: Payer: Self-pay

## 2011-03-12 MED ORDER — LOSARTAN POTASSIUM 50 MG PO TABS: 50.0000 mg | ORAL_TABLET | Freq: Every day | ORAL | Status: DC

## 2011-03-12 NOTE — Telephone Encounter (Signed)
.   Requested Prescriptions   Signed Prescriptions Disp Refills  . losartan (COZAAR) 50 MG tablet 30 tablet 11    Sig: Take 1 tablet (50 mg total) by mouth daily.    Authorizing Provider: Rollene Rotunda    Ordering User: Lacie Scotts

## 2012-01-21 ENCOUNTER — Ambulatory Visit
Admission: RE | Admit: 2012-01-21 | Discharge: 2012-01-21 | Disposition: A | Discharge status: Home / Self Care | Payer: Medicare Other | Source: Ambulatory Visit | Attending: Family Medicine | Admitting: Family Medicine

## 2012-01-21 ENCOUNTER — Other Ambulatory Visit: Payer: Self-pay | Admitting: Family Medicine

## 2012-01-21 DIAGNOSIS — R52 Pain, unspecified: Secondary | ICD-10-CM

## 2012-01-21 DIAGNOSIS — M25559 Pain in unspecified hip: Secondary | ICD-10-CM

## 2013-04-02 ENCOUNTER — Other Ambulatory Visit: Payer: Self-pay | Admitting: Family Medicine

## 2013-04-02 DIAGNOSIS — R0989 Other specified symptoms and signs involving the circulatory and respiratory systems: Secondary | ICD-10-CM

## 2013-04-06 ENCOUNTER — Other Ambulatory Visit: Payer: BC Managed Care – PPO

## 2013-04-14 ENCOUNTER — Ambulatory Visit
Admission: RE | Admit: 2013-04-14 | Discharge: 2013-04-14 | Disposition: A | Discharge status: Home / Self Care | Payer: Medicare PPO | Source: Ambulatory Visit | Attending: Family Medicine | Admitting: Family Medicine

## 2013-04-14 DIAGNOSIS — R0989 Other specified symptoms and signs involving the circulatory and respiratory systems: Secondary | ICD-10-CM

## 2013-10-19 ENCOUNTER — Ambulatory Visit
Admission: RE | Admit: 2013-10-19 | Discharge: 2013-10-19 | Disposition: A | Discharge status: Home / Self Care | Payer: Medicare PPO | Source: Ambulatory Visit | Attending: Family Medicine | Admitting: Family Medicine

## 2013-10-19 ENCOUNTER — Other Ambulatory Visit: Payer: Self-pay | Admitting: Family Medicine

## 2013-10-19 DIAGNOSIS — M25512 Pain in left shoulder: Secondary | ICD-10-CM

## 2013-10-21 ENCOUNTER — Encounter (HOSPITAL_COMMUNITY): Payer: Self-pay | Admitting: Emergency Medicine

## 2013-10-21 ENCOUNTER — Inpatient Hospital Stay (HOSPITAL_COMMUNITY): Payer: Medicare PPO

## 2013-10-21 ENCOUNTER — Emergency Department (HOSPITAL_COMMUNITY): Payer: Medicare PPO

## 2013-10-21 ENCOUNTER — Inpatient Hospital Stay (HOSPITAL_COMMUNITY)
Admission: EM | Admit: 2013-10-21 | Discharge: 2013-10-23 | DRG: 101 | Disposition: A | Discharge status: Home / Self Care | Payer: Medicare PPO | Attending: Internal Medicine | Admitting: Internal Medicine

## 2013-10-21 DIAGNOSIS — M549 Dorsalgia, unspecified: Secondary | ICD-10-CM

## 2013-10-21 DIAGNOSIS — G4733 Obstructive sleep apnea (adult) (pediatric): Secondary | ICD-10-CM | POA: Diagnosis present

## 2013-10-21 DIAGNOSIS — I129 Hypertensive chronic kidney disease with stage 1 through stage 4 chronic kidney disease, or unspecified chronic kidney disease: Secondary | ICD-10-CM | POA: Diagnosis present

## 2013-10-21 DIAGNOSIS — I498 Other specified cardiac arrhythmias: Secondary | ICD-10-CM | POA: Diagnosis present

## 2013-10-21 DIAGNOSIS — I252 Old myocardial infarction: Secondary | ICD-10-CM

## 2013-10-21 DIAGNOSIS — F319 Bipolar disorder, unspecified: Secondary | ICD-10-CM | POA: Diagnosis present

## 2013-10-21 DIAGNOSIS — E785 Hyperlipidemia, unspecified: Secondary | ICD-10-CM | POA: Diagnosis present

## 2013-10-21 DIAGNOSIS — E039 Hypothyroidism, unspecified: Secondary | ICD-10-CM | POA: Diagnosis present

## 2013-10-21 DIAGNOSIS — Z8673 Personal history of transient ischemic attack (TIA), and cerebral infarction without residual deficits: Secondary | ICD-10-CM

## 2013-10-21 DIAGNOSIS — I2510 Atherosclerotic heart disease of native coronary artery without angina pectoris: Secondary | ICD-10-CM | POA: Diagnosis present

## 2013-10-21 DIAGNOSIS — E669 Obesity, unspecified: Secondary | ICD-10-CM | POA: Diagnosis present

## 2013-10-21 DIAGNOSIS — Z79899 Other long term (current) drug therapy: Secondary | ICD-10-CM

## 2013-10-21 DIAGNOSIS — I10 Essential (primary) hypertension: Secondary | ICD-10-CM

## 2013-10-21 DIAGNOSIS — R079 Chest pain, unspecified: Secondary | ICD-10-CM

## 2013-10-21 DIAGNOSIS — J309 Allergic rhinitis, unspecified: Secondary | ICD-10-CM

## 2013-10-21 DIAGNOSIS — Z6837 Body mass index (BMI) 37.0-37.9, adult: Secondary | ICD-10-CM

## 2013-10-21 DIAGNOSIS — Z7982 Long term (current) use of aspirin: Secondary | ICD-10-CM

## 2013-10-21 DIAGNOSIS — Z96659 Presence of unspecified artificial knee joint: Secondary | ICD-10-CM

## 2013-10-21 DIAGNOSIS — I509 Heart failure, unspecified: Secondary | ICD-10-CM | POA: Diagnosis present

## 2013-10-21 DIAGNOSIS — N184 Chronic kidney disease, stage 4 (severe): Secondary | ICD-10-CM | POA: Diagnosis present

## 2013-10-21 DIAGNOSIS — G459 Transient cerebral ischemic attack, unspecified: Secondary | ICD-10-CM | POA: Diagnosis present

## 2013-10-21 DIAGNOSIS — F3289 Other specified depressive episodes: Secondary | ICD-10-CM | POA: Diagnosis present

## 2013-10-21 DIAGNOSIS — R569 Unspecified convulsions: Principal | ICD-10-CM | POA: Diagnosis present

## 2013-10-21 DIAGNOSIS — F329 Major depressive disorder, single episode, unspecified: Secondary | ICD-10-CM | POA: Diagnosis present

## 2013-10-21 DIAGNOSIS — E119 Type 2 diabetes mellitus without complications: Secondary | ICD-10-CM | POA: Diagnosis present

## 2013-10-21 DIAGNOSIS — K219 Gastro-esophageal reflux disease without esophagitis: Secondary | ICD-10-CM | POA: Diagnosis present

## 2013-10-21 DIAGNOSIS — F411 Generalized anxiety disorder: Secondary | ICD-10-CM | POA: Diagnosis present

## 2013-10-21 DIAGNOSIS — M159 Polyosteoarthritis, unspecified: Secondary | ICD-10-CM | POA: Diagnosis present

## 2013-10-21 HISTORY — DX: Gastro-esophageal reflux disease without esophagitis: K21.9

## 2013-10-21 HISTORY — DX: Acute myocardial infarction, unspecified: I21.9

## 2013-10-21 HISTORY — DX: Heart failure, unspecified: I50.9

## 2013-10-21 HISTORY — DX: Major depressive disorder, single episode, unspecified: F32.9

## 2013-10-21 HISTORY — DX: Chronic kidney disease, unspecified: N18.9

## 2013-10-21 HISTORY — DX: Personal history of other diseases of the digestive system: Z87.19

## 2013-10-21 HISTORY — DX: Headache: R51

## 2013-10-21 HISTORY — DX: Cerebral infarction, unspecified: I63.9

## 2013-10-21 HISTORY — DX: Depression, unspecified: F32.A

## 2013-10-21 HISTORY — DX: Anxiety disorder, unspecified: F41.9

## 2013-10-21 LAB — GLUCOSE, CAPILLARY
GLUCOSE-CAPILLARY: 68 mg/dL — AB (ref 70–99)
Glucose-Capillary: 133 mg/dL — ABNORMAL HIGH (ref 70–99)
Glucose-Capillary: 159 mg/dL — ABNORMAL HIGH (ref 70–99)

## 2013-10-21 LAB — CBC
HCT: 36.4 % (ref 36.0–46.0)
HEMATOCRIT: 37.8 % (ref 36.0–46.0)
HEMOGLOBIN: 11.9 g/dL — AB (ref 12.0–15.0)
Hemoglobin: 11.6 g/dL — ABNORMAL LOW (ref 12.0–15.0)
MCH: 29.3 pg (ref 26.0–34.0)
MCH: 29.4 pg (ref 26.0–34.0)
MCHC: 31.9 g/dL (ref 30.0–36.0)
MCHC: 31.5 g/dL (ref 30.0–36.0)
MCV: 91.9 fL (ref 78.0–100.0)
MCV: 93.3 fL (ref 78.0–100.0)
PLATELETS: 210 10*3/uL (ref 150–400)
Platelets: 208 10*3/uL (ref 150–400)
RBC: 3.96 MIL/uL (ref 3.87–5.11)
RBC: 4.05 MIL/uL (ref 3.87–5.11)
RDW: 14.4 % (ref 11.5–15.5)
RDW: 14.4 % (ref 11.5–15.5)
WBC: 8.1 10*3/uL (ref 4.0–10.5)
WBC: 9 10*3/uL (ref 4.0–10.5)

## 2013-10-21 LAB — I-STAT TROPONIN, ED: TROPONIN I, POC: 0.01 ng/mL (ref 0.00–0.08)

## 2013-10-21 LAB — COMPREHENSIVE METABOLIC PANEL
ALK PHOS: 111 U/L (ref 39–117)
ALT: 10 U/L (ref 0–35)
AST: 16 U/L (ref 0–37)
Albumin: 4 g/dL (ref 3.5–5.2)
BUN: 17 mg/dL (ref 6–23)
CO2: 23 meq/L (ref 19–32)
Calcium: 9.1 mg/dL (ref 8.4–10.5)
Chloride: 103 mEq/L (ref 96–112)
Creatinine, Ser: 1.62 mg/dL — ABNORMAL HIGH (ref 0.50–1.10)
GFR calc Af Amer: 36 mL/min — ABNORMAL LOW (ref >90)
GFR, EST NON AFRICAN AMERICAN: 31 mL/min — AB (ref >90)
Glucose, Bld: 91 mg/dL (ref 70–99)
Potassium: 5.1 mEq/L (ref 3.7–5.3)
SODIUM: 140 meq/L (ref 137–147)
Total Bilirubin: 0.3 mg/dL (ref 0.3–1.2)
Total Protein: 7.6 g/dL (ref 6.0–8.3)

## 2013-10-21 LAB — DIFFERENTIAL
Basophils Absolute: 0 10*3/uL (ref 0.0–0.1)
Basophils Relative: 0 % (ref 0–1)
Eosinophils Absolute: 0.1 10*3/uL (ref 0.0–0.7)
Eosinophils Relative: 2 % (ref 0–5)
LYMPHS ABS: 2.1 10*3/uL (ref 0.7–4.0)
LYMPHS PCT: 26 % (ref 12–46)
MONOS PCT: 8 % (ref 3–12)
Monocytes Absolute: 0.6 10*3/uL (ref 0.1–1.0)
Neutro Abs: 5.2 10*3/uL (ref 1.7–7.7)
Neutrophils Relative %: 64 % (ref 43–77)

## 2013-10-21 LAB — HEMOGLOBIN A1C
Hgb A1c MFr Bld: 6.4 % — ABNORMAL HIGH (ref <5.7)
MEAN PLASMA GLUCOSE: 137 mg/dL — AB (ref <117)

## 2013-10-21 LAB — CREATININE, SERUM
Creatinine, Ser: 1.58 mg/dL — ABNORMAL HIGH (ref 0.50–1.10)
GFR, EST AFRICAN AMERICAN: 37 mL/min — AB (ref >90)
GFR, EST NON AFRICAN AMERICAN: 32 mL/min — AB (ref >90)

## 2013-10-21 LAB — PROTIME-INR
INR: 1.09 (ref 0.00–1.49)
PROTHROMBIN TIME: 14.1 s (ref 11.6–15.2)

## 2013-10-21 LAB — MAGNESIUM: Magnesium: 2.1 mg/dL (ref 1.5–2.5)

## 2013-10-21 LAB — APTT: aPTT: 30 seconds (ref 24–37)

## 2013-10-21 MED ORDER — DEXTROSE 50 % IV SOLN
INTRAVENOUS | Status: AC
  Administered 2013-10-21: 50 mL
  Filled 2013-10-21: qty 50

## 2013-10-21 MED ORDER — PANTOPRAZOLE SODIUM 40 MG PO TBEC
40.0000 mg | DELAYED_RELEASE_TABLET | Freq: Every day | ORAL | Status: DC
  Administered 2013-10-22 – 2013-10-23 (×2): 40 mg via ORAL
  Filled 2013-10-21 (×2): qty 1

## 2013-10-21 MED ORDER — LEVOTHYROXINE SODIUM 112 MCG PO CAPS: 112.0000 ug | ORAL_CAPSULE | Freq: Every day | ORAL | Status: DC

## 2013-10-21 MED ORDER — ASPIRIN 81 MG PO CHEW
81.0000 mg | CHEWABLE_TABLET | Freq: Every day | ORAL | Status: DC
  Administered 2013-10-22 – 2013-10-23 (×2): 81 mg via ORAL
  Filled 2013-10-21 (×2): qty 1

## 2013-10-21 MED ORDER — LEVOTHYROXINE SODIUM 112 MCG PO TABS
112.0000 ug | ORAL_TABLET | Freq: Every day | ORAL | Status: DC
  Administered 2013-10-22 – 2013-10-23 (×2): 112 ug via ORAL
  Filled 2013-10-21 (×2): qty 1

## 2013-10-21 MED ORDER — SODIUM CHLORIDE 0.9 % IV SOLN
500.0000 mg | Freq: Two times a day (BID) | INTRAVENOUS | Status: DC
  Administered 2013-10-21 – 2013-10-22 (×2): 500 mg via INTRAVENOUS
  Filled 2013-10-21 (×2): qty 5

## 2013-10-21 MED ORDER — ASPIRIN 81 MG PO TABS: 81.0000 mg | ORAL_TABLET | Freq: Every day | ORAL | Status: DC

## 2013-10-21 MED ORDER — METFORMIN HCL 500 MG PO TABS: 1000.0000 mg | ORAL_TABLET | Freq: Two times a day (BID) | ORAL | Status: DC

## 2013-10-21 MED ORDER — ALPRAZOLAM 0.5 MG PO TABS: 1.0000 mg | ORAL_TABLET | Freq: Every evening | ORAL | Status: DC | PRN

## 2013-10-21 MED ORDER — SODIUM CHLORIDE 0.9 % IV SOLN: 1000.0000 mg | INTRAVENOUS | Status: DC

## 2013-10-21 MED ORDER — SODIUM CHLORIDE 0.9 % IV SOLN
INTRAVENOUS | Status: DC
  Administered 2013-10-21: 12:00:00 via INTRAVENOUS

## 2013-10-21 MED ORDER — VENLAFAXINE HCL ER 37.5 MG PO CP24
37.5000 mg | ORAL_CAPSULE | Freq: Every day | ORAL | Status: DC
  Administered 2013-10-22 – 2013-10-23 (×2): 37.5 mg via ORAL
  Filled 2013-10-21 (×2): qty 1

## 2013-10-21 MED ORDER — SODIUM CHLORIDE 0.9 % IV SOLN
1000.0000 mg | Freq: Once | INTRAVENOUS | Status: AC
  Administered 2013-10-21: 1000 mg via INTRAVENOUS
  Filled 2013-10-21: qty 10

## 2013-10-21 MED ORDER — IBUPROFEN 600 MG PO TABS
600.0000 mg | ORAL_TABLET | Freq: Once | ORAL | Status: AC | PRN
  Administered 2013-10-21: 600 mg via ORAL
  Filled 2013-10-21: qty 1

## 2013-10-21 MED ORDER — HEPARIN SODIUM (PORCINE) 5000 UNIT/ML IJ SOLN
5000.0000 [IU] | Freq: Three times a day (TID) | INTRAMUSCULAR | Status: DC
  Administered 2013-10-21 – 2013-10-23 (×6): 5000 [IU] via SUBCUTANEOUS
  Filled 2013-10-21 (×6): qty 1

## 2013-10-21 MED ORDER — LORAZEPAM 2 MG/ML IJ SOLN
1.0000 mg | Freq: Once | INTRAMUSCULAR | Status: AC
  Administered 2013-10-21: 1 mg via INTRAVENOUS
  Filled 2013-10-21: qty 1

## 2013-10-21 NOTE — Progress Notes (Signed)
Patient states that she does use CPAP at home but she does not want to use a hospital machine.  Patient does not have her home machine with her at this time.  RT made patient aware that if she changed her mind and wanted to use our machine that RT would come and set her up with a machine to use while in the hospital.  Pt still refusing at this time.

## 2013-10-21 NOTE — Progress Notes (Signed)
Recheck blood sugar was 133, will continue to monitor pt.

## 2013-10-21 NOTE — ED Notes (Signed)
Pt reports that at approx 0930 had onset of twitching and jerking to body, its intermittent and noted at triage. Reports had tingling and numbness sensation to left side of body that started at 0930 also. Grips are equal, no facial droop, no arm drift noted. Had episode of this occur last week also, resolved after short period of time and pt was not seen by md at that time.

## 2013-10-21 NOTE — Progress Notes (Signed)
bs 68, dextrose given. Md aware, will recheck bs.

## 2013-10-21 NOTE — Consult Note (Signed)
Reason for Consult: Recurrent left focal motor twitching.  HPI:                                                                                                                                          [name] is an 68 y.o. female history coronary disease, hypertension, diabetes mellitus and previous stroke, presenting with recurrent episodes of twitching involving left face and arm as well as difficulty with use of left leg for but 2 months. Pattern of symptoms is the same with each instance. Patient has not sought medical attention for these symptoms prior to today. No change in mental status has been noted. Symptoms typically last for a few minutes then resolved without intervention. CT scan of her head showed chronic small vessel ischemic changes but no acute abnormality. Patient had several episodes of left focal motor twitching during emergency room evaluation, involving face and left upper extremity and to a lesser extent left lower extremity. She was initially evaluated in code stroke status. Code stroke was subsequently canceled.  Past Medical History  Diagnosis Date  . CAD (coronary artery disease)      (Nonobstructive coronary artery disease 40% LAD stenosis)   . HTN (hypertension)   . OSA (obstructive sleep apnea)   . DM (diabetes mellitus)   . Hypothyroidism   . Allergic rhinitis   . Back pain   . DJD (degenerative joint disease)   . Stroke   . CHF (congestive heart failure)     Past Surgical History  Procedure Laterality Date  . Cervical disc surgery    . Carpal tunnel release    . Uvulopalatopharyngoplasty    . Salpingoophorectomy    . Abdominal hysterectomy    . Replacement total knee      Family History  Problem Relation Age of Onset  . Breast cancer Mother   . Breast cancer Sister     Social History:  reports that she has never smoked. She does not have any smokeless tobacco history on file. Her alcohol and drug histories are not on  file.  Allergies  Allergen Reactions  . Codeine   . Meperidine Hcl   . Morphine   . Penicillins     MEDICATIONS:  I have reviewed the patient's current medications.   ROS:                                                                                                                                       History obtained from the patient  General ROS: negative for - chills, fatigue, fever, night sweats, weight gain or weight loss Psychological ROS: negative for - behavioral disorder, hallucinations, memory difficulties, mood swings or suicidal ideation Ophthalmic ROS: negative for - blurry vision, double vision, eye pain or loss of vision ENT ROS: negative for - epistaxis, nasal discharge, oral lesions, sore throat, tinnitus or vertigo Allergy and Immunology ROS: negative for - hives or itchy/watery eyes Hematological and Lymphatic ROS: negative for - bleeding problems, bruising or swollen lymph nodes Endocrine ROS: negative for - galactorrhea, hair pattern changes, polydipsia/polyuria or temperature intolerance Respiratory ROS: negative for - cough, hemoptysis, shortness of breath or wheezing Cardiovascular ROS: negative for - chest pain, dyspnea on exertion, edema or irregular heartbeat Gastrointestinal ROS: negative for - abdominal pain, diarrhea, hematemesis, nausea/vomiting or stool incontinence Genito-Urinary ROS: negative for - dysuria, hematuria, incontinence or urinary frequency/urgency Musculoskeletal ROS: negative for - joint swelling or muscular weakness Neurological ROS: as noted in HPI Dermatological ROS: negative for rash and skin lesion changes   Blood pressure 143/85, pulse 64, temperature 97.7 F (36.5 C), temperature source Oral, resp. rate 12, weight 88.905 kg (196 lb), SpO2 91.00%.   Neurologic Examination:                                                                                                       Mental Status: Alert, oriented, thought content appropriate.  Speech fluent without evidence of aphasia. Able to follow commands without difficulty. Cranial Nerves: II-Visual fields were normal. III/IV/VI-Pupils were equal and reacted. Extraocular movements were full and conjugate.    V/VII-no facial numbness and no facial weakness. VIII-normal. X-normal speech and symmetrical palatal movement. XII-midline tongue extension Motor: 5/5 bilaterally with normal tone and bulk Sensory: Normal throughout. Deep Tendon Reflexes: Trace to 1+ and symmetric. Plantars: Flexor bilaterally Cerebellar: Normal finger-to-nose testing.  No results found for this basename: cbc, bmp, coags, chol, tri, ldl, hga1c    Results for orders placed during the hospital encounter of 10/21/13 (from the past 48 hour(s))  PROTIME-INR     Status: None   Collection Time    10/21/13 10:49 AM      Result Value Ref Range   Prothrombin Time  14.1  11.6 - 15.2 seconds   INR 1.09  0.00 - 1.49  APTT     Status: None   Collection Time    10/21/13 10:49 AM      Result Value Ref Range   aPTT 30  24 - 37 seconds  CBC     Status: Abnormal   Collection Time    10/21/13 10:49 AM      Result Value Ref Range   WBC 8.1  4.0 - 10.5 K/uL   RBC 3.96  3.87 - 5.11 MIL/uL   Hemoglobin 11.6 (*) 12.0 - 15.0 g/dL   HCT 36.4  36.0 - 46.0 %   MCV 91.9  78.0 - 100.0 fL   MCH 29.3  26.0 - 34.0 pg   MCHC 31.9  30.0 - 36.0 g/dL   RDW 14.4  11.5 - 15.5 %   Platelets 210  150 - 400 K/uL  DIFFERENTIAL     Status: None   Collection Time    10/21/13 10:49 AM      Result Value Ref Range   Neutrophils Relative % 64  43 - 77 %   Neutro Abs 5.2  1.7 - 7.7 K/uL   Lymphocytes Relative 26  12 - 46 %   Lymphs Abs 2.1  0.7 - 4.0 K/uL   Monocytes Relative 8  3 - 12 %   Monocytes Absolute 0.6  0.1 - 1.0 K/uL   Eosinophils Relative 2  0 - 5 %   Eosinophils Absolute 0.1  0.0 - 0.7 K/uL    Basophils Relative 0  0 - 1 %   Basophils Absolute 0.0  0.0 - 0.1 K/uL  MAGNESIUM     Status: None   Collection Time    10/21/13 10:49 AM      Result Value Ref Range   Magnesium 2.1  1.5 - 2.5 mg/dL  I-STAT TROPOININ, ED     Status: None   Collection Time    10/21/13 11:05 AM      Result Value Ref Range   Troponin i, poc 0.01  0.00 - 0.08 ng/mL   Comment 3            Comment: Due to the release kinetics of cTnI,     a negative result within the first hours     of the onset of symptoms does not rule out     myocardial infarction with certainty.     If myocardial infarction is still suspected,     repeat the test at appropriate intervals.    Ct Head (brain) Wo Contrast  10/21/2013   CLINICAL DATA:  NUMBNESS SPASMS  EXAM: CT HEAD WITHOUT CONTRAST  TECHNIQUE: Contiguous axial images were obtained from the base of the skull through the vertex without intravenous contrast.  COMPARISON:  Head CT dated 11/22/2005  FINDINGS: No acute intracranial abnormality. Specifically, no hemorrhage, hydrocephalus, mass lesion, acute infarction, or significant intracranial injury. No acute calvarial abnormality. Areas of low attenuation within the subcortical, deep, and periventricular white matter regions. Visualized paranasal sinuses and mastoid air cells are patent.  IMPRESSION: No acute intracranial abnormality. Chronic small vessel and ischemic changes. These results were called by telephone at the time of interpretation on 10/21/2013 at 11:07 AM to Dr. Nicole Kindred, who verbally acknowledged these results.   Electronically Signed   By: Margaree Mackintosh M.D.   On: 10/21/2013 11:08   Dg Shoulder Left  10/20/2013   CLINICAL DATA:  Left shoulder pain.  EXAM:  LEFT SHOULDER - 2+ VIEW  COMPARISON:  August 08, 2004.  FINDINGS: There is no evidence of fracture or dislocation. Visualized ribs appear normal. There is no evidence of arthropathy or other focal bone abnormality. Soft tissues are unremarkable.  IMPRESSION:  Normal left shoulder.   Electronically Signed   By: Sabino Dick M.D.   On: 10/20/2013 08:18     Assessment/Plan: 68 year old lady presenting with recurrent episodes as described above, likely manifestations of focal seizure activity. Etiology is unclear. CT scan was unremarkable for acute abnormality. Patient has multiple risk factors for stroke. Clinical exam showed no indications of acute stroke, however, and patient's symptoms have been intermittent now for about 2 months, rather than new onset.  Recommendations: 1. Keppra 1000 mg IV loading dose followed by 500 mg every 12 hours. 2. Ativan 1 mg IV every 30 minutes when necessary recurrent focal motor seizure activity. 3. MRI of the brain without and with contrast media. 4. EEG routine of the study.  C.R. Nicole Kindred, MD [facility] [phone]  10/21/2013, 11:31 AM

## 2013-10-21 NOTE — Progress Notes (Signed)
Per ED RN, patient failed bedside swallow eval due to her history of dysphasia. Informed MD that patient requires swallow eval from speech. Dr. Verlon Au instructed RN to perform repeat bedside swallow eval despite pt's hx of dysphasia. Patient passed bedside eval. Diet ordered per MD.  Patient is eating without any signs of distress. Lung sounds clear. Able to cough on command. Unit Director aware.

## 2013-10-21 NOTE — ED Provider Notes (Signed)
CSN: [id number]     Arrival date & time 10/21/13  1015 History   First MD Initiated Contact with Patient 10/21/13 1040     Chief Complaint  Patient presents with  . Numbness  . Spasms      HPI Pt was seen at 1040.  Per pt, c/o sudden onset and gradual improvement in constant extremity numbness and weakness that began at 0930 PTA. Pt states she had just come home from driving her grandson to the Orthodontist when she developed left sided face, LUE and LLE "numbness." Pt states her left foot "was funny when I tried to walk." Pt states her left facial and LUE "numbness" is improved since she arrived to ED. Pt states she "is twitching all over" since her other symptoms began. Pt states she had similar episode last week but did not seek medical treatment. Denies CP/palpitations, no SOB/cough, no abd pain, no N/V/D, no falls.    Past Medical History  Diagnosis Date  . CAD (coronary artery disease)      (Nonobstructive coronary artery disease 40% LAD stenosis)   . HTN (hypertension)   . OSA (obstructive sleep apnea)   . DM (diabetes mellitus)   . Hypothyroidism   . Allergic rhinitis   . Back pain   . DJD (degenerative joint disease)   . Stroke   . CHF (congestive heart failure)    Past Surgical History  Procedure Laterality Date  . Cervical disc surgery    . Carpal tunnel release    . Uvulopalatopharyngoplasty    . Salpingoophorectomy    . Abdominal hysterectomy    . Replacement total knee     Family History  Problem Relation Age of Onset  . Breast cancer Mother   . Breast cancer Sister    History  Substance Use Topics  . Smoking status: Never Smoker   . Smokeless tobacco: Not on file  . Alcohol Use: Not on file    Review of Systems ROS: Statement: All systems negative except as marked or noted in the HPI; Constitutional: Negative for fever and chills. ; ; Eyes: Negative for eye pain, redness and discharge. ; ; ENMT: Negative for ear pain, hoarseness, nasal congestion,  sinus pressure and sore throat. ; ; Cardiovascular: Negative for chest pain, palpitations, diaphoresis, dyspnea and peripheral edema. ; ; Respiratory: Negative for cough, wheezing and stridor. ; ; Gastrointestinal: Negative for nausea, vomiting, diarrhea, abdominal pain, blood in stool, hematemesis, jaundice and rectal bleeding. . ; ; Genitourinary: Negative for dysuria, flank pain and hematuria. ; ; Musculoskeletal: Negative for back pain and neck pain. Negative for swelling and trauma.; ; Skin: Negative for pruritus, rash, abrasions, blisters, bruising and skin lesion.; ; Neuro: Negative for headache, lightheadedness and neck stiffness. Negative for weakness, altered level of consciousness , altered mental status, +extremity weakness, paresthesias, involuntary movement.      Allergies  Codeine; Meperidine hcl; Morphine; and Penicillins  Home Medications   Prior to Admission medications   Medication Sig Start Date End Date Taking? Authorizing Jakevion Arney  ALPRAZolam Duanne Moron) 1 MG tablet Take 1 mg by mouth at bedtime as needed.      Historical Gardner Servantes, MD  aspirin 81 MG tablet Take 81 mg by mouth daily.      Historical Jazarah Capili, MD  cloNIDine (CATAPRES) 0.2 MG tablet Take 0.2 mg by mouth 2 (two) times daily.      Historical Hanna Aultman, MD  desvenlafaxine (PRISTIQ) 50 MG 24 hr tablet Take 50 mg by mouth daily.  Historical Izan Miron, MD  furosemide (LASIX) 40 MG tablet Take 40 mg by mouth daily.      Historical Kavin Weckwerth, MD  iron polysaccharides (NU-IRON) 150 MG capsule Take 150 mg by mouth daily.      Historical Kaylynn Chamblin, MD  Levothyroxine Sodium 112 MCG CAPS Take by mouth daily.      Historical Besan Ketchem, MD  losartan (COZAAR) 50 MG tablet Take 1 tablet (50 mg total) by mouth daily. 03/12/11 03/11/12  Minus Breeding, MD  meclizine (ANTIVERT) 25 MG tablet Take 12.5 mg by mouth 3 (three) times daily as needed.      Historical Ardian Haberland, MD  meloxicam (MOBIC) 7.5 MG tablet Take 7.5 mg by mouth daily.       Historical Sherisse Fullilove, MD  metFORMIN (GLUCOPHAGE) 1000 MG tablet Take 1,000 mg by mouth 2 (two) times daily with a meal.     Historical Daleon Willinger, MD  metoprolol (LOPRESSOR) 50 MG tablet Take 50 mg by mouth 2 (two) times daily.      Historical Mersedes Alber, MD  omeprazole (PRILOSEC) 20 MG capsule Take 20 mg by mouth daily.      Historical Kirk Basquez, MD  rOPINIRole (REQUIP) 0.25 MG tablet Take 0.25 mg by mouth at bedtime.      Historical Taysia Rivere, MD  simvastatin (ZOCOR) 40 MG tablet Take 40 mg by mouth at bedtime.      Historical Shavy Beachem, MD  spironolactone (ALDACTONE) 25 MG tablet Take 25 mg by mouth daily.      Historical Brynlei Klausner, MD  traMADol (ULTRAM) 50 MG tablet Take 50 mg by mouth every 6 (six) hours as needed.      Historical Marily Konczal, MD  valsartan (DIOVAN) 320 MG tablet Take 320 mg by mouth daily.      Historical Yeny Schmoll, MD   BP 96/68  Temp(Src) 97.7 F (36.5 C) (Oral)  Resp 17  Wt 196 lb (88.905 kg)  SpO2 96% Physical Exam 1045: Physical examination:  Nursing notes reviewed; Vital signs and O2 SAT reviewed;  Constitutional: Well developed, Well nourished, Well hydrated, In no acute distress; Head:  Normocephalic, atraumatic; Eyes: EOMI, PERRL, No scleral icterus; ENMT: Mouth and pharynx normal, Mucous membranes moist; Neck: Supple, Full range of motion, No lymphadenopathy; Cardiovascular: Regular rate and rhythm, No gallop; Respiratory: Breath sounds clear & equal bilaterally, No rales, rhonchi, wheezes.  Speaking full sentences with ease, Normal respiratory effort/excursion; Chest: Nontender, Movement normal; Abdomen: Soft, Nontender, Nondistended, Normal bowel sounds; Genitourinary: No CVA tenderness; Extremities: Pulses normal, No tenderness, No edema, No calf edema or asymmetry.; Neuro: AA&Ox3, Major CN grossly intact.Speech clear.  No facial droop.  No nystagmus. Grips equal. Strength 5/5 equal bilat UE's and RLE, 4/5 strength LLE.  DTR 2/4 equal bilat UE's and LE's. +LLE subjective  sensory deficits.  Normal cerebellar testing bilat UE's (finger-nose) and LE's (heel-shin). +twitching bilat UE's and face.; Skin: Color normal, Warm, Dry.   ED Course  Procedures   1000:  Neuro Dr Nicole Kindred has evaluated pt: NIH score 2, code stroke cancelled, dose IV ativan and IV keppra for focal seizures, admit to medicine service.  1115:  Symptoms improved after meds. Dx and testing d/w pt and family.  Questions answered.  Verb understanding, agreeable to admit.  T/C to [facility] Dr. Verlon Au, case discussed, including:  HPI, pertinent PM/SHx, VS/PE, dx testing, ED course and treatment:  Agreeable to admit, requests to write temporary orders, obtain tele bed to team 8.       EKG Interpretation   Date/Time:  Wednesday October 21 2013 10:24:41 EDT Ventricular Rate:  57 PR Interval:  188 QRS Duration: 78 QT Interval:  470 QTC Calculation: 457 R Axis:   95 Text Interpretation:  Sinus bradycardia Rightward axis Low voltage QRS T  wave abnormality Lateral leads Borderline ECG When compared with ECG of  12/01/2009 T wave abnormality is improved from previous  Confirmed by  [facility]  MD, Nunzio Cory [phone]) on 10/21/2013 10:53:14 AM      MDM  MDM Reviewed: previous chart, nursing note and vitals Reviewed previous: labs and ECG Interpretation: labs, ECG and CT scan Total time providing critical care: 30-74 minutes. This excludes time spent performing separately reportable procedures and services. Consults: neurology and admitting MD    CRITICAL CARE Performed by: Alfonzo Feller Total critical care time: 35 Critical care time was exclusive of separately billable procedures and treating other patients. Critical care was necessary to treat or prevent imminent or life-threatening deterioration. Critical care was time spent personally by me on the following activities: development of treatment plan with patient and/or surrogate as well as nursing, discussions with consultants, evaluation of  patient's response to treatment, examination of patient, obtaining history from patient or surrogate, ordering and performing treatments and interventions, ordering and review of laboratory studies, ordering and review of radiographic studies, pulse oximetry and re-evaluation of patient's condition.   Results for orders placed during the hospital encounter of 10/21/13  [facility]      Result Value Ref Range   Prothrombin Time 14.1  11.6 - 15.2 seconds   INR 1.09  0.00 - 1.49  APTT      Result Value Ref Range   aPTT 30  24 - 37 seconds  CBC      Result Value Ref Range   WBC 8.1  4.0 - 10.5 K/uL   RBC 3.96  3.87 - 5.11 MIL/uL   Hemoglobin 11.6 (*) 12.0 - 15.0 g/dL   HCT 36.4  36.0 - 46.0 %   MCV 91.9  78.0 - 100.0 fL   MCH 29.3  26.0 - 34.0 pg   MCHC 31.9  30.0 - 36.0 g/dL   RDW 14.4  11.5 - 15.5 %   Platelets 210  150 - 400 K/uL  DIFFERENTIAL      Result Value Ref Range   Neutrophils Relative % 64  43 - 77 %   Neutro Abs 5.2  1.7 - 7.7 K/uL   Lymphocytes Relative 26  12 - 46 %   Lymphs Abs 2.1  0.7 - 4.0 K/uL   Monocytes Relative 8  3 - 12 %   Monocytes Absolute 0.6  0.1 - 1.0 K/uL   Eosinophils Relative 2  0 - 5 %   Eosinophils Absolute 0.1  0.0 - 0.7 K/uL   Basophils Relative 0  0 - 1 %   Basophils Absolute 0.0  0.0 - 0.1 K/uL  COMPREHENSIVE METABOLIC PANEL      Result Value Ref Range   Sodium 140  137 - 147 mEq/L   Potassium 5.1  3.7 - 5.3 mEq/L   Chloride 103  96 - 112 mEq/L   CO2 23  19 - 32 mEq/L   Glucose, Bld 91  70 - 99 mg/dL   BUN 17  6 - 23 mg/dL   Creatinine, Ser 1.62 (*) 0.50 - 1.10 mg/dL   Calcium 9.1  8.4 - 10.5 mg/dL   Total Protein 7.6  6.0 - 8.3 g/dL   Albumin 4.0  3.5 - 5.2 g/dL  AST 16  0 - 37 U/L   ALT 10  0 - 35 U/L   Alkaline Phosphatase 111  39 - 117 U/L   Total Bilirubin 0.3  0.3 - 1.2 mg/dL   GFR calc non Af Amer 31 (*) >90 mL/min   GFR calc Af Amer 36 (*) >90 mL/min  MAGNESIUM      Result Value Ref Range   Magnesium 2.1  1.5 - 2.5  mg/dL  I-STAT TROPOININ, ED      Result Value Ref Range   Troponin i, poc 0.01  0.00 - 0.08 ng/mL   Comment 3            Ct Head (brain) Wo Contrast 10/21/2013   CLINICAL DATA:  NUMBNESS SPASMS  EXAM: CT HEAD WITHOUT CONTRAST  TECHNIQUE: Contiguous axial images were obtained from the base of the skull through the vertex without intravenous contrast.  COMPARISON:  Head CT dated 11/22/2005  FINDINGS: No acute intracranial abnormality. Specifically, no hemorrhage, hydrocephalus, mass lesion, acute infarction, or significant intracranial injury. No acute calvarial abnormality. Areas of low attenuation within the subcortical, deep, and periventricular white matter regions. Visualized paranasal sinuses and mastoid air cells are patent.  IMPRESSION: No acute intracranial abnormality. Chronic small vessel and ischemic changes. These results were called by telephone at the time of interpretation on 10/21/2013 at 11:07 AM to Dr. Nicole Kindred, who verbally acknowledged these results.   Electronically Signed   By: Margaree Mackintosh M.D.   On: 10/21/2013 11:08       Alfonzo Feller, DO 10/24/13 2221

## 2013-10-21 NOTE — Code Documentation (Signed)
68yo female arriving to [facility] via private vehicle.  Code stroke called for c/o left sided paresthesias.  Patient met in CT by Stroke team.  CT scan completed.  Initial NIHSS 2, see documentation for details and times.  Patient reports that she experienced left sided twitching starting at 0930 this morning.  The twitching is associated with left sided numbness and weakness.  Patient reports having multiple similar episodes over the last two months with the same symptoms.  Her last episode was on Sunday, but symptoms resolved after 5-63mn and she did not seek treatment.  Dr. SNicole Kindredat bedside, will treat for focal seizure activity per MD, medications ordered.  No acute stroke treatment at this time.  Code stroke canceled.  Bedside handoff with ED RN.

## 2013-10-21 NOTE — H&P (Signed)
[facility] History and Physical  ADAM DEMARY JJH:[id number] DOB: 01/24/1946 DOA: 10/21/2013  Referring physician: ED PCP: Vidal Schwalbe, MD  Specialists: Neurology  Chief Complaint: Seizure/stroke?  HPI: 68 y/o ?, known h/o osteoarthritis, htn, non-obs CAD 2003, Hld, Dm ty 2, Anemia,Degen cervical disc surgery x 3, Ulnar N palsy, Benign fibrous mastopathy possible TIA in the past came to emergency room because of weakness and shakiness all over She is a moderate historian but has slow speech and her husband supplements history. He states that for the past week she's been having episodes where she has twitching of her extremities and went to her care physician Dr. Dema Severin because of trouble walking and weakness in her left leg She states that she's never had seizures before and that the tingling in her lower extremities made her feel as if her legs were heavy She did not state that she had any dizziness or any blurred or double vision. She has had no loss of consciousness and at baseline uses a walker She is unable to tell me what brings on the effects of this She had some lip smacking as well as peripheral limb movement and neurology was consulted as this is thought to be a "stroke initially which was then called off. CT scan of the head was performed in the emergency room showing no acute intracranial abnormality as well as chronic small vessel ischemic changes  Her BUN/creatinine is elevated from prior results 17/1.6 to the rest of her labs are essentially normal her potassium is little high at 5.1   Review of Systems: The patient denies  Chest pain Cough Fever Chills Nausea Vomiting History Diarrhea Cough Cold  Past Medical History  Diagnosis Date  . CAD (coronary artery disease)      (Nonobstructive coronary artery disease 40% LAD stenosis)   . HTN (hypertension)   . OSA (obstructive sleep apnea)   . DM (diabetes mellitus)   . Hypothyroidism   . Allergic  rhinitis   . Back pain   . DJD (degenerative joint disease)   . Stroke   . CHF (congestive heart failure)    Past Surgical History  Procedure Laterality Date  . Cervical disc surgery    . Carpal tunnel release    . Uvulopalatopharyngoplasty    . Salpingoophorectomy    . Abdominal hysterectomy    . Replacement total knee     Social History:  History   Social History Narrative  . No narrative on file    Allergies  Allergen Reactions  . Codeine   . Meperidine Hcl   . Morphine   . Penicillins     Family History  Problem Relation Age of Onset  . Breast cancer Mother   . Breast cancer Sister     Prior to Admission medications   Medication Sig Start Date End Date Taking? Authorizing Provider  ALPRAZolam Duanne Moron) 1 MG tablet Take 1 mg by mouth at bedtime as needed.      Historical Provider, MD  aspirin 81 MG tablet Take 81 mg by mouth daily.      Historical Provider, MD  cloNIDine (CATAPRES) 0.2 MG tablet Take 0.2 mg by mouth 2 (two) times daily.      Historical Provider, MD  desvenlafaxine (PRISTIQ) 50 MG 24 hr tablet Take 50 mg by mouth daily.      Historical Provider, MD  furosemide (LASIX) 40 MG tablet Take 40 mg by mouth daily.      Historical Provider, MD  iron polysaccharides (NU-IRON) 150 MG capsule Take 150 mg by mouth daily.      Historical Provider, MD  Levothyroxine Sodium 112 MCG CAPS Take by mouth daily.      Historical Provider, MD  losartan (COZAAR) 50 MG tablet Take 1 tablet (50 mg total) by mouth daily. 03/12/11 03/11/12  Minus Breeding, MD  meclizine (ANTIVERT) 25 MG tablet Take 12.5 mg by mouth 3 (three) times daily as needed.      Historical Provider, MD  meloxicam (MOBIC) 7.5 MG tablet Take 7.5 mg by mouth daily.      Historical Provider, MD  metFORMIN (GLUCOPHAGE) 1000 MG tablet Take 1,000 mg by mouth 2 (two) times daily with a meal.     Historical Provider, MD  metoprolol (LOPRESSOR) 50 MG tablet Take 50 mg by mouth 2 (two) times daily.      Historical  Provider, MD  omeprazole (PRILOSEC) 20 MG capsule Take 20 mg by mouth daily.      Historical Provider, MD  rOPINIRole (REQUIP) 0.25 MG tablet Take 0.25 mg by mouth at bedtime.      Historical Provider, MD  simvastatin (ZOCOR) 40 MG tablet Take 40 mg by mouth at bedtime.      Historical Provider, MD  spironolactone (ALDACTONE) 25 MG tablet Take 25 mg by mouth daily.      Historical Provider, MD  traMADol (ULTRAM) 50 MG tablet Take 50 mg by mouth every 6 (six) hours as needed.      Historical Provider, MD  valsartan (DIOVAN) 320 MG tablet Take 320 mg by mouth daily.      Historical Provider, MD   Physical Exam: Filed Vitals:   10/21/13 1020 10/21/13 1100  BP: 96/68 143/85  Pulse:  64  Temp: 97.7 F (36.5 C)   TempSrc: Oral   Resp: 17 12  Weight: 88.905 kg (196 lb)   SpO2: 96% 91%     General:  Doesn't oriented little bit sleepy he has just received Ativan   Eyes: EOMI NCAT   ENT: Soft supple nontender no JVD no bruit   Neck: Soft supple   Cardiovascular: S1-S2 no murmur rub or gallop   Respiratory: Clinically clear   Abdomen: Soft nontender nondistended no rebound or guarding   Skin: No lower extremity edema   Musculoskeletal: Range of motion intact   Psychiatric:  Euthymic but flat affect   Neurologic: Grossly intact 5/5 power although little bit of intention deficit on the left side and shortness at flexion of the hip fatty dorsiflexors are normal bicep tricep extension normal shoulder shrug is normal, temperature decrease in the midline smile symmetric  Labs on Admission:  Basic Metabolic Panel:  Recent Labs Lab 10/21/13 1049  NA 140  K 5.1  CL 103  CO2 23  GLUCOSE 91  BUN 17  CREATININE 1.62*  CALCIUM 9.1  MG 2.1   Liver Function Tests:  Recent Labs Lab 10/21/13 1049  AST 16  ALT 10  ALKPHOS 111  BILITOT 0.3  PROT 7.6  ALBUMIN 4.0   No results found for this basename: LIPASE, AMYLASE,  in the last 168 hours No results found for this basename:  AMMONIA,  in the last 168 hours CBC:  Recent Labs Lab 10/21/13 1049  WBC 8.1  NEUTROABS 5.2  HGB 11.6*  HCT 36.4  MCV 91.9  PLT 210   Cardiac Enzymes: No results found for this basename: CKTOTAL, CKMB, CKMBINDEX, TROPONINI,  in the last 168 hours  BNP (last 3  results) No results found for this basename: PROBNP,  in the last 8760 hours CBG: No results found for this basename: GLUCAP,  in the last 168 hours  Radiological Exams on Admission: Ct Head (brain) Wo Contrast  10/21/2013   CLINICAL DATA:  NUMBNESS SPASMS  EXAM: CT HEAD WITHOUT CONTRAST  TECHNIQUE: Contiguous axial images were obtained from the base of the skull through the vertex without intravenous contrast.  COMPARISON:  Head CT dated 11/22/2005  FINDINGS: No acute intracranial abnormality. Specifically, no hemorrhage, hydrocephalus, mass lesion, acute infarction, or significant intracranial injury. No acute calvarial abnormality. Areas of low attenuation within the subcortical, deep, and periventricular white matter regions. Visualized paranasal sinuses and mastoid air cells are patent.  IMPRESSION: No acute intracranial abnormality. Chronic small vessel and ischemic changes. These results were called by telephone at the time of interpretation on 10/21/2013 at 11:07 AM to Dr. Nicole Kindred, who verbally acknowledged these results.   Electronically Signed   By: Margaree Mackintosh M.D.   On: 10/21/2013 11:08   Dg Shoulder Left  10/20/2013   CLINICAL DATA:  Left shoulder pain.  EXAM: LEFT SHOULDER - 2+ VIEW  COMPARISON:  August 08, 2004.  FINDINGS: There is no evidence of fracture or dislocation. Visualized ribs appear normal. There is no evidence of arthropathy or other focal bone abnormality. Soft tissues are unremarkable.  IMPRESSION: Normal left shoulder.   Electronically Signed   By: Sabino Dick M.D.   On: 10/20/2013 08:18    EKG: Independently reviewed. Sinus bradycardia PR interval 0.12 QRS axis 0 low voltage  complexes  Assessment/Plan Principal Problem:   Focal seizures-unclear if metabolic or anatomical etiology. Patient takes tramadol as well as multiple other medications such as meclizine which could have extrapyramidal side effects in addition to Requip. I have discontinued her Requip, tramadol, meclizine for now. EEG has been ordered as well as MRI and we will follow along with neurology who noted her Keppra 1000 mg IV which will be continued twice a day he Active Problems:   HYPOTHYROIDISM-unlikely to be a cause of this although tremor could present with hyperthyroidism. We will order TSH   OBESITY-outpatient monitoring   OBSTRUCTIVE SLEEP APNEA-she uses a CPAP machine at home does not know the settings therefore we will have respiratory therapy see the patient to help this   CORONARY ARTERY DISEASE-nonobstructive from cardiac catheterization 2003   BRADYCARDIA-stable her current time. I have held off on metoprolol 50 mg twice a day   DEGENERATIVE JOINT DISEASE, GENERALIZED-monitor   TIA (transient ischemic attack)-because he didn't know she's had a stroke I will continue aspirin however have held off on on the 0.2 mg, losartan 50 daily, metoprolol 50 twice a day, Aldactone 25 daily, Valcyte and 320 daily. These will need to be resumed subsequent to admission--in addition I'm not sure why she should be on hold losartan and Diovan as are the same as   GERD continue Protonix   Bipolar continue procedure 50 daily, Xanax 1 mg ordered,   Diabetes mellitus continue metformin 1000 twice a day, low risk for hypoglycemia with this  45 minutes Discussed with husband at bedside Full code  [place], [facility] Pager [phone]  If 7PM-7AM, please contact night-coverage [email or web address] Password [facility] 10/21/2013, 11:43 AM

## 2013-10-21 NOTE — Progress Notes (Signed)
UR complete.  Courtney Robarge RN, MSN 

## 2013-10-21 NOTE — Progress Notes (Signed)
SLP Cancellation Note  Patient Details Name: [name] MRN: [id number] DOB: 18-Jan-1946   Cancelled treatment:        Attempted to see pt. For swallow evaluation.  Pt. Was having EEG.  SLP will see pt. In am 6/25.   Quinn Axe T 10/21/2013, 4:59 PM

## 2013-10-21 NOTE — Progress Notes (Signed)
PHARMACIST - PHYSICIAN COMMUNICATION DR:  team CONCERNING:  METFORMIN SAFE ADMINISTRATION POLICY  RECOMMENDATION: Metformin has been placed on DISCONTINUE (rejected order) STATUS and should be reordered only after any of the conditions below are ruled out.  Current safety recommendations include avoiding metformin for a minimum of 48 hours after the patient's exposure to intravenous contrast media.  DESCRIPTION:  The Pharmacy Committee has adopted a policy that restricts the use of metformin in hospitalized patients until all the contraindications to administration have been ruled out. Specific contraindications are: []  Serum creatinine ? 1.5 for males [x]  Serum creatinine ? 1.4 for females []  Shock, acute MI, sepsis, hypoxemia, dehydration []  Planned administration of intravenous iodinated contrast media []  Heart Failure patients with low EF []  Acute or chronic metabolic acidosis (including DKA)     [name], BCPS  Clinical Pharmacist Pager [phone]  10/21/2013 2:34 PM

## 2013-10-21 NOTE — Progress Notes (Signed)
EEG completed; results pending.    

## 2013-10-22 ENCOUNTER — Inpatient Hospital Stay (HOSPITAL_COMMUNITY): Payer: Medicare PPO

## 2013-10-22 DIAGNOSIS — G459 Transient cerebral ischemic attack, unspecified: Secondary | ICD-10-CM

## 2013-10-22 LAB — CBC WITH DIFFERENTIAL/PLATELET
BASOS ABS: 0 10*3/uL (ref 0.0–0.1)
BASOS PCT: 1 % (ref 0–1)
Eosinophils Absolute: 0.2 10*3/uL (ref 0.0–0.7)
Eosinophils Relative: 3 % (ref 0–5)
HCT: 34.4 % — ABNORMAL LOW (ref 36.0–46.0)
Hemoglobin: 10.8 g/dL — ABNORMAL LOW (ref 12.0–15.0)
Lymphocytes Relative: 32 % (ref 12–46)
Lymphs Abs: 2.3 10*3/uL (ref 0.7–4.0)
MCH: 29.4 pg (ref 26.0–34.0)
MCHC: 31.4 g/dL (ref 30.0–36.0)
MCV: 93.7 fL (ref 78.0–100.0)
MONO ABS: 0.6 10*3/uL (ref 0.1–1.0)
Monocytes Relative: 8 % (ref 3–12)
NEUTROS PCT: 56 % (ref 43–77)
Neutro Abs: 4.1 10*3/uL (ref 1.7–7.7)
Platelets: 165 10*3/uL (ref 150–400)
RBC: 3.67 MIL/uL — ABNORMAL LOW (ref 3.87–5.11)
RDW: 14.6 % (ref 11.5–15.5)
WBC: 7.2 10*3/uL (ref 4.0–10.5)

## 2013-10-22 LAB — URINALYSIS, ROUTINE W REFLEX MICROSCOPIC
BILIRUBIN URINE: NEGATIVE
Glucose, UA: 250 mg/dL — AB
Hgb urine dipstick: NEGATIVE
KETONES UR: NEGATIVE mg/dL
LEUKOCYTES UA: NEGATIVE
NITRITE: NEGATIVE
PH: 5 (ref 5.0–8.0)
PROTEIN: NEGATIVE mg/dL
Specific Gravity, Urine: 1.015 (ref 1.005–1.030)
UROBILINOGEN UA: 0.2 mg/dL (ref 0.0–1.0)

## 2013-10-22 LAB — GLUCOSE, CAPILLARY
Glucose-Capillary: 135 mg/dL — ABNORMAL HIGH (ref 70–99)
Glucose-Capillary: 152 mg/dL — ABNORMAL HIGH (ref 70–99)
Glucose-Capillary: 126 mg/dL — ABNORMAL HIGH (ref 70–99)
Glucose-Capillary: 123 mg/dL — ABNORMAL HIGH (ref 70–99)

## 2013-10-22 LAB — RENAL FUNCTION PANEL
Albumin: 3.5 g/dL (ref 3.5–5.2)
BUN: 17 mg/dL (ref 6–23)
CHLORIDE: 106 meq/L (ref 96–112)
CO2: 23 mEq/L (ref 19–32)
CREATININE: 1.71 mg/dL — AB (ref 0.50–1.10)
Calcium: 8.4 mg/dL (ref 8.4–10.5)
GFR calc Af Amer: 34 mL/min — ABNORMAL LOW (ref >90)
GFR calc non Af Amer: 29 mL/min — ABNORMAL LOW (ref >90)
Glucose, Bld: 141 mg/dL — ABNORMAL HIGH (ref 70–99)
POTASSIUM: 4.8 meq/L (ref 3.7–5.3)
Phosphorus: 3.6 mg/dL (ref 2.3–4.6)
Sodium: 142 mEq/L (ref 137–147)

## 2013-10-22 LAB — LIPID PANEL
CHOLESTEROL: 118 mg/dL (ref 0–200)
HDL: 30 mg/dL — ABNORMAL LOW (ref >39)
LDL CALC: 50 mg/dL (ref 0–99)
Total CHOL/HDL Ratio: 3.9 RATIO
Triglycerides: 192 mg/dL — ABNORMAL HIGH (ref <150)
VLDL: 38 mg/dL (ref 0–40)

## 2013-10-22 LAB — SODIUM, URINE, RANDOM: Sodium, Ur: 82 mEq/L

## 2013-10-22 LAB — CREATININE, URINE, RANDOM: Creatinine, Urine: 64.3 mg/dL

## 2013-10-22 LAB — OSMOLALITY, URINE: Osmolality, Ur: 367 mOsm/kg — ABNORMAL LOW (ref 390–1090)

## 2013-10-22 LAB — OSMOLALITY: OSMOLALITY: 298 mosm/kg (ref 275–300)

## 2013-10-22 LAB — TSH: TSH: 4.35 u[IU]/mL (ref 0.350–4.500)

## 2013-10-22 MED ORDER — DEXTROSE 5 % IV SOLN
1.0000 g | INTRAVENOUS | Status: DC
  Administered 2013-10-22: 1 g via INTRAVENOUS
  Filled 2013-10-22 (×3): qty 10

## 2013-10-22 MED ORDER — LEVETIRACETAM 500 MG PO TABS
500.0000 mg | ORAL_TABLET | Freq: Two times a day (BID) | ORAL | Status: DC
  Administered 2013-10-22 – 2013-10-23 (×2): 500 mg via ORAL
  Filled 2013-10-22 (×2): qty 1

## 2013-10-22 MED ORDER — IBUPROFEN 400 MG PO TABS
400.0000 mg | ORAL_TABLET | ORAL | Status: DC | PRN
  Administered 2013-10-23: 400 mg via ORAL
  Filled 2013-10-22 (×2): qty 1

## 2013-10-22 MED ORDER — SODIUM CHLORIDE 0.9 % IV SOLN
INTRAVENOUS | Status: AC
  Administered 2013-10-22: 12:00:00 via INTRAVENOUS

## 2013-10-22 NOTE — Evaluation (Signed)
Clinical/Bedside Swallow Evaluation Patient Details  Name: [name] MRN: [id number] Date of Birth: [birth year]  Today's Date: 10/22/2013 Time: 9702-6378 SLP Time Calculation (min): 14 min  Past Medical History:  Past Medical History  Diagnosis Date  . CAD (coronary artery disease)      (Nonobstructive coronary artery disease 40% LAD stenosis)   . HTN (hypertension)   . OSA (obstructive sleep apnea)   . DM (diabetes mellitus)   . Hypothyroidism   . Allergic rhinitis   . Back pain   . DJD (degenerative joint disease)   . Stroke   . CHF (congestive heart failure)   . Myocardial infarction 2013  . Depression   . Anxiety   . Chronic kidney disease     STAGE 4 KIDNEY DISEASE  . GERD (gastroesophageal reflux disease)   . H/O hiatal hernia   . HYIFOYDX(412.8)    Past Surgical History:  Past Surgical History  Procedure Laterality Date  . Cervical disc surgery    . Carpal tunnel release    . Uvulopalatopharyngoplasty    . Salpingoophorectomy    . Abdominal hysterectomy    . Replacement total knee     HPI:  68 y/o female with known h/o osteoarthritis, htn, non-obs CAD 2003, Hld, Dm ty 2, Anemia,Degen cervical disc surgery x 3, Ulnar N palsy, Benign fibrous mastopathy possible TIA in the past came to emergency room because of weakness and shakiness all over. CT negative. Pt reported history of dysphagia and failed stroke swallow. MD asked RN to complete screen anyway and pt was started on a diet.    Assessment / Plan / Recommendation Clinical Impression  Pt reports a history of esophageal dysphagia and current assessment is consistent with this history. Pt does not have any new signs of aspiration or dysphagia related to CVA, but does demonstrate a belching and globus at baseline. Pt may continue current diet, but SLP offered verbal and written esopahgeal precuations and suggested pt f/u with her GI MD to determine if further f/u is warranted as she has had esophageal  dilatations in the past. No further acute w/u needed, education complete.     Aspiration Risk  Mild    Diet Recommendation Regular;Thin liquid   Liquid Administration via: Cup;Straw Medication Administration: Whole meds with liquid Supervision: Patient able to self feed Compensations: Slow rate;Small sips/bites;Follow solids with liquid Postural Changes and/or Swallow Maneuvers: Seated upright 90 degrees;Upright 30-60 min after meal;Out of bed for meals    Other  Recommendations Recommended Consults: Consider GI evaluation Oral Care Recommendations: Oral care BID   Follow Up Recommendations  None    Frequency and Duration        Pertinent Vitals/Pain NA    SLP Swallow Goals     Swallow Study Prior Functional Status       General HPI: 68 y/o female with known h/o osteoarthritis, htn, non-obs CAD 2003, Hld, Dm ty 2, Anemia,Degen cervical disc surgery x 3, Ulnar N palsy, Benign fibrous mastopathy possible TIA in the past came to emergency room because of weakness and shakiness all over. CT negative. Pt reported history of dysphagia and failed stroke swallow. MD asked RN to complete screen anyway and pt was started on a diet.  Type of Study: Bedside swallow evaluation Previous Swallow Assessment: Barium swallow, esopahgeal dilatations per pt.  Diet Prior to this Study: Regular;Thin liquids Temperature Spikes Noted: No Respiratory Status: Room air History of Recent Intubation: No Behavior/Cognition: Alert;Cooperative;Pleasant mood Oral Cavity - Dentition: Dentures,  top;Dentures, bottom Self-Feeding Abilities: Able to feed self Patient Positioning: Upright in chair Baseline Vocal Quality: Clear Volitional Cough: Strong Volitional Swallow: Able to elicit    Oral/Motor/Sensory Function Overall Oral Motor/Sensory Function: Appears within functional limits for tasks assessed   Ice Chips     Thin Liquid Thin Liquid: Impaired Presentation: Cup;Straw;Self Fed Pharyngeal  Phase  Impairments: Multiple swallows (belching)    Nectar Thick Nectar Thick Liquid: Not tested   Honey Thick Honey Thick Liquid: Not tested   Puree Puree: Not tested   Solid   GO    Solid: Within functional limits      [facility], [place] CCC-SLP [phone]  [place], Katherene Ponto 10/22/2013,9:56 AM

## 2013-10-22 NOTE — Progress Notes (Signed)
  Echocardiogram 2D Echocardiogram has been performed.  Diamond Nickel 10/22/2013, 11:27 AM

## 2013-10-22 NOTE — Progress Notes (Signed)
Patient refused CPAP for tonight, states she "can make it without until I get home".  Advised patient to call for RT if she changes her mind

## 2013-10-22 NOTE — Progress Notes (Signed)
Subjective:  Patient has no complaints.  Has no twitching of left face or arm.   Objective: Current vital signs: BP 140/58  Pulse 58  Temp(Src) 97.8 F (36.6 C) (Oral)  Resp 18  Ht 4\' 11"  (1.499 m)  Wt 84.414 kg (186 lb 1.6 oz)  BMI 37.57 kg/m2  SpO2 98% Vital signs in last 24 hours: Temp:  [97.4 F (36.3 C)-98.2 F (36.8 C)] 97.8 F (36.6 C) (06/25 0950) Pulse Rate:  [58-83] 58 (06/25 0950) Resp:  [18-20] 18 (06/25 0950) BP: (107-162)/(48-77) 140/58 mmHg (06/25 0950) SpO2:  [96 %-98 %] 98 % (06/25 0950) Weight:  [84.414 kg (186 lb 1.6 oz)] 84.414 kg (186 lb 1.6 oz) (06/24 1418)  Intake/Output from previous day: 06/24 0701 - 06/25 0700 In: 578.8 [P.O.:120; I.V.:458.8] Out: -  Intake/Output this shift: Total I/O In: 240 [P.O.:240] Out: -  Nutritional status: Carb Control  Neurologic Exam: Mental Status: Alert, oriented, thought content appropriate.  Speech fluent without evidence of aphasia.  Able to follow 3 step commands without difficulty. Cranial Nerves: II: Discs flat bilaterally; Visual fields grossly normal, pupils equal, round, reactive to light and accommodation III,IV, VI: ptosis not present, extra-ocular motions intact bilaterally V,VII: smile symmetric, facial light touch sensation normal bilaterally VIII: hearing normal bilaterally IX,X: gag reflex present XI: bilateral shoulder shrug XII: midline tongue extension without atrophy or fasciculations  Motor: Right : Upper extremity   5/5    Left:     Upper extremity   5/5  Lower extremity   5/5     Lower extremity   5/5 Tone and bulk:normal tone throughout; no atrophy noted Sensory: Pinprick and light touch intact throughout, bilaterally Deep Tendon Reflexes:  1+ throughout with no AJ  Plantars: Right: downgoing   Left: downgoing Cerebellar: normal finger-to-nose,  normal heel-to-shin test     Lab Results: Basic Metabolic Panel:  Recent Labs Lab 10/21/13 1049 10/21/13 1557 10/22/13 0440   NA 140  --  142  K 5.1  --  4.8  CL 103  --  106  CO2 23  --  23  GLUCOSE 91  --  141*  BUN 17  --  17  CREATININE 1.62* 1.58* 1.71*  CALCIUM 9.1  --  8.4  MG 2.1  --   --   PHOS  --   --  3.6    Liver Function Tests:  Recent Labs Lab 10/21/13 1049 10/22/13 0440  AST 16  --   ALT 10  --   ALKPHOS 111  --   BILITOT 0.3  --   PROT 7.6  --   ALBUMIN 4.0 3.5   No results found for this basename: LIPASE, AMYLASE,  in the last 168 hours No results found for this basename: AMMONIA,  in the last 168 hours  CBC:  Recent Labs Lab 10/21/13 1049 10/21/13 1557 10/22/13 0440  WBC 8.1 9.0 7.2  NEUTROABS 5.2  --  4.1  HGB 11.6* 11.9* 10.8*  HCT 36.4 37.8 34.4*  MCV 91.9 93.3 93.7  PLT 210 208 165    Cardiac Enzymes: No results found for this basename: CKTOTAL, CKMB, CKMBINDEX, TROPONINI,  in the last 168 hours  Lipid Panel:  Recent Labs Lab 10/22/13 0440  CHOL 118  TRIG 192*  HDL 30*  CHOLHDL 3.9  VLDL 38  LDLCALC 50    CBG:  Recent Labs Lab 10/21/13 1635 10/21/13 1722 10/21/13 2142 10/22/13 0648 10/22/13 1201  GLUCAP 68* 133* 159* 135* 152*  Microbiology: Results for orders placed during the hospital encounter of 01/09/11  CULTURE, ROUTINE-ABSCESS     Status: None   Collection Time    01/09/11  1:11 PM      Result Value Ref Range Status   Specimen Description STERNUM   Final   Special Requests CENTER   Final   Gram Stain     Final   Value: NO WBC SEEN     NO SQUAMOUS EPITHELIAL CELLS SEEN     NO ORGANISMS SEEN   Culture NO GROWTH 3 DAYS   Final   Report Status 01/13/2011 FINAL   Final    Coagulation Studies:  Recent Labs  10/21/13 1049  LABPROT 14.1  INR 1.09    Imaging: Ct Head (brain) Wo Contrast  10/21/2013   CLINICAL DATA:  NUMBNESS SPASMS  EXAM: CT HEAD WITHOUT CONTRAST  TECHNIQUE: Contiguous axial images were obtained from the base of the skull through the vertex without intravenous contrast.  COMPARISON:  Head CT dated  11/22/2005  FINDINGS: No acute intracranial abnormality. Specifically, no hemorrhage, hydrocephalus, mass lesion, acute infarction, or significant intracranial injury. No acute calvarial abnormality. Areas of low attenuation within the subcortical, deep, and periventricular white matter regions. Visualized paranasal sinuses and mastoid air cells are patent.  IMPRESSION: No acute intracranial abnormality. Chronic small vessel and ischemic changes. These results were called by telephone at the time of interpretation on 10/21/2013 at 11:07 AM to Dr. Nicole Kindred, who verbally acknowledged these results.   Electronically Signed   By: Margaree Mackintosh M.D.   On: 10/21/2013 11:08    Medications:  Scheduled: . aspirin  81 mg Oral Daily  . heparin  5,000 Units Subcutaneous 3 times per day  . levETIRAcetam  500 mg Oral BID  . levothyroxine  112 mcg Oral QAC breakfast  . pantoprazole  40 mg Oral Daily  . venlafaxine XR  37.5 mg Oral Q breakfast    Assessment/Plan:  68 YO female presenting with left focal seizure activity.  Activity has stopped after receiving Keppra. She has had no SE on Keppra.  EEG reading pending.  MRI pending.   Recommend: 1) Continue Keppra 500 mg Q12 hours 2) MRI pending 3) will follow EEG 4) will need neurology follow up in 1-2 months after discharge 5) No driving, operating heavy machinery, perform activities at heights, swimming or participation in water activities until release by outpatient physician.  This has been discussed with patient.     Etta Quill PA-C [facility] [phone]  10/22/2013, 1:25 PM

## 2013-10-22 NOTE — Progress Notes (Signed)
Pt left floor via W/C  to Vascular Lab, then to radiology for MRI.

## 2013-10-22 NOTE — Progress Notes (Signed)
Patient Demographics  [name], is a 68 y.o. female, DOB - 1945-07-06, LKT:[id number]  Admit date - 10/21/2013   Admitting Physician Nita Sells, MD  Outpatient Primary MD for the patient is Vidal Schwalbe, MD  LOS - 1   Chief Complaint  Patient presents with  . Numbness  . Spasms           Subjective:   [name] today has, No headache, No chest pain, No abdominal pain - No Nausea, No new weakness tingling or numbness, No Cough - SOB.      Assessment & Plan    Focal seizures-unclear if metabolic or anatomical etiology. CT unremarkable, on Keppra, neuro following, MRI EEG pending. Currently seizure free.     Renal failure - likely acute, last creatinine from 2011 was normal. Obtain urine electrolytes, check bladder scan, gentle hydration, monitor BMP. Hold Cozaar and Lasix.      HYPOTHYROIDISM-unlikely to be a cause of this although tremor could present with hyperthyroidism. We will order TSH     OBESITY-outpatient monitoring     OBSTRUCTIVE SLEEP APNEA-she uses a CPAP continue.     CORONARY ARTERY DISEASE-nonobstructive from cardiac catheterization 2003     Hypertension with BRADYCARDIA- stable her current time. Hold beta blocker, TSH pending, and Norvasc for blood pressure control./    DEGENERATIVE JOINT DISEASE, GENERALIZED-monitor     History of TIA (transient ischemic attack)- continue aspirin at present dose.     GERD continue Protonix     Bipolar continue procedure 50 daily, Xanax 1 mg ordered.    Diabetes mellitus continue metformin 1000 twice a day, low risk for hypoglycemia     Lab Results  Component Value Date   HGBA1C 6.4* 10/21/2013    CBG (last 3)   Recent Labs  10/21/13 1722 10/21/13 2142  10/22/13 0648  GLUCAP 133* 159* 135*        Code Status: Full  Family Communication: None present  Disposition Plan: Home   Procedures CT head, MRI brain, EEG, x-ray left shoulder  Consults Neuro   Medications  Scheduled Meds: . aspirin  81 mg Oral Daily  . heparin  5,000 Units Subcutaneous 3 times per day  . levETIRAcetam  500 mg Intravenous Q12H  . levothyroxine  112 mcg Oral QAC breakfast  . pantoprazole  40 mg Oral Daily  . venlafaxine XR  37.5 mg Oral Q breakfast   Continuous Infusions: . sodium chloride 75 mL/hr at 10/21/13 1153   PRN Meds:.ALPRAZolam  DVT Prophylaxis  Heparin   Lab Results  Component Value Date   PLT 165 10/22/2013    Antibiotics    Anti-infectives   None          Objective:   Filed Vitals:   10/21/13 2143 10/22/13 0145 10/22/13 0550 10/22/13 0950  BP: 107/56 138/48 146/59 140/58  Pulse: 68 73 83 58  Temp: 97.9 F (36.6 C) 98.2 F (36.8 C) 97.8 F (36.6 C) 97.8 F (36.6 C)  TempSrc: Oral Oral Oral Oral  Resp: 20 18 18 18   Height:      Weight:      SpO2: 97% 96% 97% 98%    Wt Readings from Last 3 Encounters:  10/21/13 84.414 kg (186 lb 1.6  oz)  08/24/10 85.276 kg (188 lb)  01/24/10 85.276 kg (188 lb)     Intake/Output Summary (Last 24 hours) at 10/22/13 1206 Last data filed at 10/22/13 0900  Gross per 24 hour  Intake 818.75 ml  Output      0 ml  Net 818.75 ml     Physical Exam  Awake Alert, Oriented X 3, No new F.N deficits, Normal affect [place].AT,PERRAL Supple Neck,No JVD, No cervical lymphadenopathy appriciated.  Symmetrical Chest wall movement, Good air movement bilaterally, CTAB RRR,No Gallops,Rubs or new Murmurs, No Parasternal Heave +ve B.Sounds, Abd Soft, No tenderness, No organomegaly appriciated, No rebound - guarding or rigidity. No Cyanosis, Clubbing or edema, No new Rash or bruise     Data Review   Micro Results No results found for this or any previous visit (from the past 240  hour(s)).  Radiology Reports Ct Head (brain) Wo Contrast  10/21/2013   CLINICAL DATA:  NUMBNESS SPASMS  EXAM: CT HEAD WITHOUT CONTRAST  TECHNIQUE: Contiguous axial images were obtained from the base of the skull through the vertex without intravenous contrast.  COMPARISON:  Head CT dated 11/22/2005  FINDINGS: No acute intracranial abnormality. Specifically, no hemorrhage, hydrocephalus, mass lesion, acute infarction, or significant intracranial injury. No acute calvarial abnormality. Areas of low attenuation within the subcortical, deep, and periventricular white matter regions. Visualized paranasal sinuses and mastoid air cells are patent.  IMPRESSION: No acute intracranial abnormality. Chronic small vessel and ischemic changes. These results were called by telephone at the time of interpretation on 10/21/2013 at 11:07 AM to Dr. Nicole Kindred, who verbally acknowledged these results.   Electronically Signed   By: Margaree Mackintosh M.D.   On: 10/21/2013 11:08   Dg Shoulder Left  10/20/2013   CLINICAL DATA:  Left shoulder pain.  EXAM: LEFT SHOULDER - 2+ VIEW  COMPARISON:  August 08, 2004.  FINDINGS: There is no evidence of fracture or dislocation. Visualized ribs appear normal. There is no evidence of arthropathy or other focal bone abnormality. Soft tissues are unremarkable.  IMPRESSION: Normal left shoulder.   Electronically Signed   By: Sabino Dick M.D.   On: 10/20/2013 08:18    CBC  Recent Labs Lab 10/21/13 1049 10/21/13 1557 10/22/13 0440  WBC 8.1 9.0 7.2  HGB 11.6* 11.9* 10.8*  HCT 36.4 37.8 34.4*  PLT 210 208 165  MCV 91.9 93.3 93.7  MCH 29.3 29.4 29.4  MCHC 31.9 31.5 31.4  RDW 14.4 14.4 14.6  LYMPHSABS 2.1  --  2.3  MONOABS 0.6  --  0.6  EOSABS 0.1  --  0.2  BASOSABS 0.0  --  0.0    Chemistries   Recent Labs Lab 10/21/13 1049 10/21/13 1557 10/22/13 0440  NA 140  --  142  K 5.1  --  4.8  CL 103  --  106  CO2 23  --  23  GLUCOSE 91  --  141*  BUN 17  --  17  CREATININE 1.62*  1.58* 1.71*  CALCIUM 9.1  --  8.4  MG 2.1  --   --   AST 16  --   --   ALT 10  --   --   ALKPHOS 111  --   --   BILITOT 0.3  --   --    ------------------------------------------------------------------------------------------------------------------ estimated creatinine clearance is 28.9 ml/min (by C-G formula based on Cr of 1.71). ------------------------------------------------------------------------------------------------------------------  Recent Labs  10/21/13 1557  HGBA1C 6.4*   ------------------------------------------------------------------------------------------------------------------  Recent Labs  10/22/13 0440  CHOL 118  HDL 30*  LDLCALC 50  TRIG 192*  CHOLHDL 3.9   ------------------------------------------------------------------------------------------------------------------ No results found for this basename: TSH, T4TOTAL, FREET3, T3FREE, THYROIDAB,  in the last 72 hours ------------------------------------------------------------------------------------------------------------------ No results found for this basename: VITAMINB12, FOLATE, FERRITIN, TIBC, IRON, RETICCTPCT,  in the last 72 hours  Coagulation profile  Recent Labs Lab 10/21/13 1049  INR 1.09    No results found for this basename: DDIMER,  in the last 72 hours  Cardiac Enzymes No results found for this basename: CK, CKMB, TROPONINI, MYOGLOBIN,  in the last 168 hours ------------------------------------------------------------------------------------------------------------------ No components found with this basename: POCBNP,      Time Spent in minutes   35   Safwan Tomei K M.D on 10/22/2013 at 12:06 PM  Between 7am to 7pm - Pager - [phone]  After 7pm go to [email or web address] - password [email or web address]  And look for the night coverage person covering for me after hours  [facility] Office  [phone]   **Disclaimer: This note may have been dictated with voice  recognition software. Similar sounding words can inadvertently be transcribed and this note may contain transcription errors which may not have been corrected upon publication of note.**

## 2013-10-22 NOTE — Procedures (Signed)
ELECTROENCEPHALOGRAM REPORT  Patient: [name]       Room #: 8T15 EEG No. ID: [id number] Age: 68 y.o.        Sex: female Referring Physician: Candiss Norse Report Date:  10/22/2013        Interpreting Physician: Anthony Sar  History: [name] is an 68 y.o. female admitted for management of recurrent episodes of focal left-sided facial and extremity twitching, indicative of new onset focal seizure disorder.  Indications for study:  Rule out focal seizure activity.  Technique: This is an 18 channel routine scalp EEG performed at the bedside with bipolar and monopolar montages arranged in accordance to the international 10/20 system of electrode placement.   Description: This EEG recording was performed during wakefulness. Background activity consisted of diffuse low amplitude 20-25 Hz rhythmic beta activity which was most prominent in the frontal and central regions. Intermittent runs of 10-11 Hz of activity were recorded from the posterior head regions. Photic stimulation produced a symmetrical occipital driving response. Hyperventilation was not performed. No epileptiform discharges were recorded. There was no abnormal slowing of cerebral activity.  Interpretation: This is a normal EEG recording during wakefulness. No signs of focal seizure activity were recorded during the study.   Rush Farmer M.D. [facility] [phone]

## 2013-10-23 LAB — GLUCOSE, CAPILLARY
GLUCOSE-CAPILLARY: 119 mg/dL — AB (ref 70–99)
Glucose-Capillary: 153 mg/dL — ABNORMAL HIGH (ref 70–99)

## 2013-10-23 LAB — URINE CULTURE
COLONY COUNT: NO GROWTH
CULTURE: NO GROWTH

## 2013-10-23 LAB — BASIC METABOLIC PANEL
BUN: 17 mg/dL (ref 6–23)
CHLORIDE: 103 meq/L (ref 96–112)
CO2: 22 meq/L (ref 19–32)
Calcium: 9.1 mg/dL (ref 8.4–10.5)
Creatinine, Ser: 1.63 mg/dL — ABNORMAL HIGH (ref 0.50–1.10)
GFR calc non Af Amer: 31 mL/min — ABNORMAL LOW (ref >90)
GFR, EST AFRICAN AMERICAN: 36 mL/min — AB (ref >90)
Glucose, Bld: 115 mg/dL — ABNORMAL HIGH (ref 70–99)
POTASSIUM: 5.1 meq/L (ref 3.7–5.3)
SODIUM: 140 meq/L (ref 137–147)

## 2013-10-23 MED ORDER — AMLODIPINE BESYLATE 10 MG PO TABS: 10.0000 mg | ORAL_TABLET | Freq: Every day | ORAL | Status: DC

## 2013-10-23 MED ORDER — LEVETIRACETAM 500 MG PO TABS: 500.0000 mg | ORAL_TABLET | Freq: Two times a day (BID) | ORAL | Status: DC

## 2013-10-23 MED ORDER — METOPROLOL TARTRATE 25 MG PO TABS: 25.0000 mg | ORAL_TABLET | Freq: Two times a day (BID) | ORAL | Status: DC

## 2013-10-23 MED ORDER — CEFUROXIME AXETIL 500 MG PO TABS: 500.0000 mg | ORAL_TABLET | Freq: Two times a day (BID) | ORAL | Status: DC

## 2013-10-23 NOTE — Discharge Instructions (Signed)
Do not drive, operating heavy machinery, perform activities at heights, swimming or participation in water activities or provide baby sitting services until you have seen by the Neurologist and advised to do so again.  Follow with Primary MD Vidal Schwalbe, MD in 7 days   Get CBC, CMP  checked  by Primary MD next visit.    Activity: As tolerated with Full fall precautions use walker/cane & assistance as needed   Disposition Home    Diet: Heart Healthy Low carb  For Heart failure patients - Check your Weight same time everyday, if you gain over 2 pounds, or you develop in leg swelling, experience more shortness of breath or chest pain, call your Primary MD immediately. Follow Cardiac Low Salt Diet and 1.8 lit/day fluid restriction.   On your next visit with her primary care physician please Get Medicines reviewed and adjusted.  Please request your Prim.MD to go over all Hospital Tests and Procedure/Radiological results at the follow up, please get all Hospital records sent to your Prim MD by signing hospital release before you go home.   If you experience worsening of your admission symptoms, develop shortness of breath, life threatening emergency, suicidal or homicidal thoughts you must seek medical attention immediately by calling 911 or calling your MD immediately  if symptoms less severe.  You Must read complete instructions/literature along with all the possible adverse reactions/side effects for all the Medicines you take and that have been prescribed to you. Take any new Medicines after you have completely understood and accpet all the possible adverse reactions/side effects.     Do not drive when taking Pain medications.    Do not take more than prescribed Pain, Sleep and Anxiety Medications  Special Instructions: If you have smoked or chewed Tobacco  in the last 2 yrs please stop smoking, stop any regular Alcohol  and or any Recreational drug use.  Wear Seat belts while  driving.   Please note  You were cared for by a hospitalist during your hospital stay. If you have any questions about your discharge medications or the care you received while you were in the hospital after you are discharged, you can call the unit and asked to speak with the hospitalist on call if the hospitalist that took care of you is not available. Once you are discharged, your primary care physician will handle any further medical issues. Please note that NO REFILLS for any discharge medications will be authorized once you are discharged, as it is imperative that you return to your primary care physician (or establish a relationship with a primary care physician if you do not have one) for your aftercare needs so that they can reassess your need for medications and monitor your lab values.

## 2013-10-23 NOTE — Discharge Summary (Signed)
[name], is a 68 y.o. female  DOB July 13, 1945  MRN [id number].  Admission date:  10/21/2013  Admitting Physician  Nita Sells, MD  Discharge Date:  10/23/2013   Primary MD  Vidal Schwalbe, MD  Recommendations for primary care physician for things to follow:   Kindly monitor BMP closely, please make sure patient follows with recommended neurologist within one to 2 weeks   Admission Diagnosis  Type II or unspecified type diabetes mellitus without mention of complication, not stated as uncontrolled [250.00] Essential hypertension, benign [401.1] Focal seizures [780.39] Transient cerebral ischemia, unspecified transient cerebral ischemia type [435.9]   Discharge Diagnosis  Type II or unspecified type diabetes mellitus without mention of complication, not stated as uncontrolled [250.00] Essential hypertension, benign [401.1] Focal seizures [780.39] Transient cerebral ischemia, unspecified transient cerebral ischemia type [435.9]    Principal Problem:   Focal seizures Active Problems:   HYPOTHYROIDISM   OBESITY   OBSTRUCTIVE SLEEP APNEA   CORONARY ARTERY DISEASE   BRADYCARDIA   DEGENERATIVE JOINT DISEASE, GENERALIZED   TIA (transient ischemic attack)      Past Medical History  Diagnosis Date  . CAD (coronary artery disease)      (Nonobstructive coronary artery disease 40% LAD stenosis)   . HTN (hypertension)   . OSA (obstructive sleep apnea)   . DM (diabetes mellitus)   . Hypothyroidism   . Allergic rhinitis   . Back pain   . DJD (degenerative joint disease)   . Stroke   . CHF (congestive heart failure)   . Myocardial infarction 2013  . Depression   . Anxiety   . Chronic kidney disease     STAGE 4 KIDNEY DISEASE  . GERD (gastroesophageal reflux disease)   . H/O hiatal hernia   .  BPZWCHEN(277.8)     Past Surgical History  Procedure Laterality Date  . Cervical disc surgery    . Carpal tunnel release    . Uvulopalatopharyngoplasty    . Salpingoophorectomy    . Abdominal hysterectomy    . Replacement total knee       Discharge Condition: stable   Follow UP  Follow-up Information   Follow up with WHITE,CYNTHIA S, MD. Schedule an appointment as soon as possible for a visit in 1 week.   Specialty:  Family Medicine   Contact information:   [address] [place] [address] [phone]       Follow up with [facility]. Schedule an appointment as soon as possible for a visit in 1 week.   Contact information:   [address]     [place] [address] [phone]        Discharge Instructions  and  Discharge Medications          Discharge Instructions   Discharge instructions    Complete by:  As directed   Do not drive, operating heavy machinery, perform activities at heights, swimming or participation in water activities or provide baby sitting services until you  have seen by the Neurologist and advised to do so again.  Follow with Primary MD Vidal Schwalbe, MD in 7 days   Get CBC, CMP  checked  by Primary MD next visit.    Activity: As tolerated with Full fall precautions use walker/cane & assistance as needed   Disposition Home    Diet: Heart Healthy Low carb  For Heart failure patients - Check your Weight same time everyday, if you gain over 2 pounds, or you develop in leg swelling, experience more shortness of breath or chest pain, call your Primary MD immediately. Follow Cardiac Low Salt Diet and 1.8 lit/day fluid restriction.   On your next visit with her primary care physician please Get Medicines reviewed and adjusted.  Please request your Prim.MD to go over all Hospital Tests and Procedure/Radiological results at the follow up, please get all Hospital records sent to your  Prim MD by signing hospital release before you go home.   If you experience worsening of your admission symptoms, develop shortness of breath, life threatening emergency, suicidal or homicidal thoughts you must seek medical attention immediately by calling 911 or calling your MD immediately  if symptoms less severe.  You Must read complete instructions/literature along with all the possible adverse reactions/side effects for all the Medicines you take and that have been prescribed to you. Take any new Medicines after you have completely understood and accpet all the possible adverse reactions/side effects.     Do not drive when taking Pain medications.    Do not take more than prescribed Pain, Sleep and Anxiety Medications  Special Instructions: If you have smoked or chewed Tobacco  in the last 2 yrs please stop smoking, stop any regular Alcohol  and or any Recreational drug use.  Wear Seat belts while driving.   Please note  You were cared for by a hospitalist during your hospital stay. If you have any questions about your discharge medications or the care you received while you were in the hospital after you are discharged, you can call the unit and asked to speak with the hospitalist on call if the hospitalist that took care of you is not available. Once you are discharged, your primary care physician will handle any further medical issues. Please note that NO REFILLS for any discharge medications will be authorized once you are discharged, as it is imperative that you return to your primary care physician (or establish a relationship with a primary care physician if you do not have one) for your aftercare needs so that they can reassess your need for medications and monitor your lab values.  Do not drive, operating heavy machinery, perform activities at heights, swimming or participation in water activities or provide baby sitting services until you have seen by the Neurologist and advised  to do so again.  Follow with Primary MD Vidal Schwalbe, MD in 7 days   Get CBC, CMP  checked  by Primary MD next visit.    Activity: As tolerated with Full fall precautions use walker/cane & assistance as needed   Disposition Home    Diet: Heart Healthy Low carb  For Heart failure patients - Check your Weight same time everyday, if you gain over 2 pounds, or you develop in leg swelling, experience more shortness of breath or chest pain, call your Primary MD immediately. Follow Cardiac Low Salt Diet and 1.8 lit/day fluid restriction.   On your next visit with her primary care physician please Get Medicines reviewed and  adjusted.  Please request your Prim.MD to go over all Hospital Tests and Procedure/Radiological results at the follow up, please get all Hospital records sent to your Prim MD by signing hospital release before you go home.   If you experience worsening of your admission symptoms, develop shortness of breath, life threatening emergency, suicidal or homicidal thoughts you must seek medical attention immediately by calling 911 or calling your MD immediately  if symptoms less severe.  You Must read complete instructions/literature along with all the possible adverse reactions/side effects for all the Medicines you take and that have been prescribed to you. Take any new Medicines after you have completely understood and accpet all the possible adverse reactions/side effects.     Do not drive when taking Pain medications.    Do not take more than prescribed Pain, Sleep and Anxiety Medications  Special Instructions: If you have smoked or chewed Tobacco  in the last 2 yrs please stop smoking, stop any regular Alcohol  and or any Recreational drug use.  Wear Seat belts while driving.   Please note  You were cared for by a hospitalist during your hospital stay. If you have any questions about your discharge medications or the care you received while you were in the  hospital after you are discharged, you can call the unit and asked to speak with the hospitalist on call if the hospitalist that took care of you is not available. Once you are discharged, your primary care physician will handle any further medical issues. Please note that NO REFILLS for any discharge medications will be authorized once you are discharged, as it is imperative that you return to your primary care physician (or establish a relationship with a primary care physician if you do not have one) for your aftercare needs so that they can reassess your need for medications and monitor your lab values.     Increase activity slowly    Complete by:  As directed             Medication List    STOP taking these medications       ciprofloxacin 250 MG tablet  Commonly known as:  CIPRO     meloxicam 7.5 MG tablet  Commonly known as:  MOBIC     spironolactone 25 MG tablet  Commonly known as:  ALDACTONE      TAKE these medications       ACCU-CHEK SMARTVIEW test strip  Generic drug:  glucose blood     ALPRAZolam 1 MG tablet  Commonly known as:  XANAX  Take 1 mg by mouth at bedtime as needed.     amLODipine 10 MG tablet  Commonly known as:  NORVASC  Take 1 tablet (10 mg total) by mouth daily.     aspirin 81 MG tablet  Take 81 mg by mouth daily.     atorvastatin 20 MG tablet  Commonly known as:  LIPITOR     buPROPion 300 MG 24 hr tablet  Commonly known as:  WELLBUTRIN XL  Take 300 mg by mouth daily.     cefUROXime 500 MG tablet  Commonly known as:  CEFTIN  Take 1 tablet (500 mg total) by mouth 2 (two) times daily with a meal.     cloNIDine 0.2 MG tablet  Commonly known as:  CATAPRES  Take 0.2 mg by mouth 2 (two) times daily.     desvenlafaxine 50 MG 24 hr tablet  Commonly known as:  PRISTIQ  Take  50 mg by mouth daily.     furosemide 40 MG tablet  Commonly known as:  LASIX  Take 40 mg by mouth daily.     gabapentin 600 MG tablet  Commonly known as:  NEURONTIN    Take 600 mg by mouth 3 (three) times daily.     glimepiride 2 MG tablet  Commonly known as:  AMARYL  Take 2 mg by mouth daily.     levETIRAcetam 500 MG tablet  Commonly known as:  KEPPRA  Take 1 tablet (500 mg total) by mouth 2 (two) times daily.     Levothyroxine Sodium 112 MCG Caps  Take by mouth daily.     losartan 50 MG tablet  Commonly known as:  COZAAR  Take 1 tablet (50 mg total) by mouth daily.     metFORMIN 1000 MG tablet  Commonly known as:  GLUCOPHAGE  Take 1,000 mg by mouth 2 (two) times daily with a meal.     metoprolol tartrate 25 MG tablet  Commonly known as:  LOPRESSOR  Take 1 tablet (25 mg total) by mouth 2 (two) times daily.     NU-IRON 150 MG capsule  Generic drug:  iron polysaccharides  Take 150 mg by mouth daily.     omeprazole 20 MG capsule  Commonly known as:  PRILOSEC  Take 20 mg by mouth daily.     ONGLYZA 2.5 MG Tabs tablet  Generic drug:  saxagliptin HCl  Take 2.5 mg by mouth daily.     rOPINIRole 0.25 MG tablet  Commonly known as:  REQUIP  Take 0.25 mg by mouth at bedtime.     traMADol 50 MG tablet  Commonly known as:  ULTRAM  Take 50 mg by mouth every 6 (six) hours as needed.     traZODone 50 MG tablet  Commonly known as:  DESYREL  Take 50 mg by mouth 2 (two) times daily.     Vitamin D-3 5000 UNITS Tabs  Take 5,000 Units by mouth daily.          Diet and Activity recommendation: See Discharge Instructions above   Consults obtained - Neuro   Major procedures and Radiology Reports - PLEASE review detailed and final reports for all details, in brief -   EEG Interpretation: This is a normal EEG recording during wakefulness. No signs of focal seizure activity were recorded during the study.    Carotid US -   Findings suggest 1-39% stenosis bilaterally. Vertebral arteries are patent with antegrade flow.    Echo - Left ventricle: E/e&'>14 suggestive of elevated LV filling pressures. The cavity size was normal.  Systolic function was normal. The estimated ejection fraction was in the range of 55% to 60%. There was an increased relative contribution of atrial contraction to ventricular filling. Doppler parameters are consistent with abnormal left ventricular relaxation (grade 1 diastolic dysfunction). - Pericardium, extracardiac: A trivial, free-flowing pericardial effusion was identified along the left ventricular free wall. The fluid had no internal echoes.There was no evidence of hemodynamic compromise.    Ct Head (brain) Wo Contrast  10/21/2013   CLINICAL DATA:  NUMBNESS SPASMS  EXAM: CT HEAD WITHOUT CONTRAST  TECHNIQUE: Contiguous axial images were obtained from the base of the skull through the vertex without intravenous contrast.  COMPARISON:  Head CT dated 11/22/2005  FINDINGS: No acute intracranial abnormality. Specifically, no hemorrhage, hydrocephalus, mass lesion, acute infarction, or significant intracranial injury. No acute calvarial abnormality. Areas of low attenuation within the subcortical, deep, and  periventricular white matter regions. Visualized paranasal sinuses and mastoid air cells are patent.  IMPRESSION: No acute intracranial abnormality. Chronic small vessel and ischemic changes. These results were called by telephone at the time of interpretation on 10/21/2013 at 11:07 AM to Dr. Nicole Kindred, who verbally acknowledged these results.   Electronically Signed   By: Margaree Mackintosh M.D.   On: 10/21/2013 11:08   Mri Brain Without Contrast  10/22/2013   CLINICAL DATA:  Stroke.  Left facial and extremity twitching  EXAM: MRI HEAD WITHOUT CONTRAST  MRA HEAD WITHOUT CONTRAST  TECHNIQUE: Multiplanar, multiecho pulse sequences of the brain and surrounding structures were obtained without intravenous contrast. Angiographic images of the head were obtained using MRA technique without contrast.  COMPARISON:  CT head 10/21/2013  FINDINGS: MRI HEAD FINDINGS  Negative for acute infarct.  Mild atrophy and mild  chronic microvascular ischemic change in the white matter. Brainstem and cerebellum are intact. Basal ganglia is intact.  Negative for hemorrhage or mass lesion. Negative for hydrocephalus. No midline shift.  MRA HEAD FINDINGS  Both vertebral arteries are patent to the basilar. PICA is patent bilaterally. The basilar is widely patent. Prominent right AICA. Superior cerebellar and posterior cerebral arteries are widely patent bilaterally  Internal carotid artery widely patent. Patent posterior communicating artery on the right. Anterior and middle cerebral arteries widely patent. Negative for cerebral aneurysm.  IMPRESSION: No acute infarct  Negative MRA of the head.   Electronically Signed   By: Franchot Gallo M.D.   On: 10/22/2013 20:10   Dg Shoulder Left  10/20/2013   CLINICAL DATA:  Left shoulder pain.  EXAM: LEFT SHOULDER - 2+ VIEW  COMPARISON:  August 08, 2004.  FINDINGS: There is no evidence of fracture or dislocation. Visualized ribs appear normal. There is no evidence of arthropathy or other focal bone abnormality. Soft tissues are unremarkable.  IMPRESSION: Normal left shoulder.   Electronically Signed   By: Sabino Dick M.D.   On: 10/20/2013 08:18   Mr Jodene Nam Head/brain Wo Cm  10/22/2013   CLINICAL DATA:  Stroke.  Left facial and extremity twitching  EXAM: MRI HEAD WITHOUT CONTRAST  MRA HEAD WITHOUT CONTRAST  TECHNIQUE: Multiplanar, multiecho pulse sequences of the brain and surrounding structures were obtained without intravenous contrast. Angiographic images of the head were obtained using MRA technique without contrast.  COMPARISON:  CT head 10/21/2013  FINDINGS: MRI HEAD FINDINGS  Negative for acute infarct.  Mild atrophy and mild chronic microvascular ischemic change in the white matter. Brainstem and cerebellum are intact. Basal ganglia is intact.  Negative for hemorrhage or mass lesion. Negative for hydrocephalus. No midline shift.  MRA HEAD FINDINGS  Both vertebral arteries are patent to the  basilar. PICA is patent bilaterally. The basilar is widely patent. Prominent right AICA. Superior cerebellar and posterior cerebral arteries are widely patent bilaterally  Internal carotid artery widely patent. Patent posterior communicating artery on the right. Anterior and middle cerebral arteries widely patent. Negative for cerebral aneurysm.  IMPRESSION: No acute infarct  Negative MRA of the head.   Electronically Signed   By: Franchot Gallo M.D.   On: 10/22/2013 20:10    Micro Results      No results found for this or any previous visit (from the past 240 hour(s)).   History of present illness and  Hospital Course:     Kindly see H&P for history of present illness and admission details, please review complete Labs, Consult reports and Test reports for all  details in brief [name], is a 68 y.o. female, patient with history of  hypertension, CAD, dyslipidemia, diabetes mellitus, chronic kidney disease stage III baseline creatinine 1.8 discussed with PCP today, came in to the hospital with left sided twitching suspicious for focal seizures.       Focal seizures-unclear if metabolic or anatomical etiology. Currently seizure free on Keppra, discussed with neurology will be discharged on that. CT head, MRI brain and EEG all unremarkable. We'll follow with PCP in neurology outpatient post discharge. Seizure instructions given in writing.     Chronic renal failure stage III. Call PCP office her baseline creatinine 2 months ago was 1.8. She is on Cozaar and Lasix which will be commenced, I have discontinued her Aldactone due to high normal potassium. We'll request PCP to monitor EMP and adjust medications as needed.     HYPOTHYROIDISM-TSH stable on present dose Synthroid    Mild incidental finding of pericardial effusion an echogram. Outpatient followup with cardiologist, request PCP to please arrange for the same     OBESITY-outpatient monitoring      OBSTRUCTIVE  SLEEP APNEA-she uses a CPAP continue   .  CORONARY ARTERY DISEASE-nonobstructive from cardiac catheterization 2003     Hypertension with BRADYCARDIA- reduced beta blocker dose added Norvasc. TSH stable. Request PCP to monitor blood pressure does medications as needed.    DEGENERATIVE JOINT DISEASE, GENERALIZED-monitor     History of TIA (transient ischemic attack)- continue aspirin at present dose.     GERD continue Protonix     Bipolar continue procedure 50 daily, Xanax 1 mg ordered.    Diabetes mellitus continue metformin 1000 twice a day, low risk for hypoglycemia    Lab Results   Component  Value  Date    HGBA1C  6.4*  10/21/2013       Today   Subjective:   [name] today has no headache,no chest abdominal pain,no new weakness tingling or numbness, feels much better wants to go home today.   Objective:   Blood pressure 155/55, pulse 59, temperature 98.3 F (36.8 C), temperature source Oral, resp. rate 18, height 4\' 11"  (1.499 m), weight 84.414 kg (186 lb 1.6 oz), SpO2 99.00%.   Intake/Output Summary (Last 24 hours) at 10/23/13 1116 Last data filed at 10/23/13 0900  Gross per 24 hour  Intake    340 ml  Output    800 ml  Net   -460 ml    Exam Awake Alert, Oriented x 3, No new F.N deficits, Normal affect [place].AT,PERRAL Supple Neck,No JVD, No cervical lymphadenopathy appriciated.  Symmetrical Chest wall movement, Good air movement bilaterally, CTAB RRR,No Gallops,Rubs or new Murmurs, No Parasternal Heave +ve B.Sounds, Abd Soft, Non tender, No organomegaly appriciated, No rebound -guarding or rigidity. No Cyanosis, Clubbing or edema, No new Rash or bruise  Data Review   CBC w Diff: Lab Results  Component Value Date   WBC 7.2 10/22/2013   WBC 6.9 12/28/2009   HGB 10.8* 10/22/2013   HGB 11.1* 12/28/2009   HCT 34.4* 10/22/2013   HCT 33.2* 12/28/2009   PLT 165 10/22/2013   PLT 199 12/28/2009   LYMPHOPCT 32 10/22/2013   LYMPHOPCT 31.5 12/28/2009     MONOPCT 8 10/22/2013   MONOPCT 9.7 12/28/2009   EOSPCT 3 10/22/2013   EOSPCT 6.0 12/28/2009   BASOPCT 1 10/22/2013   BASOPCT 0.6 12/28/2009    CMP: Lab Results  Component Value Date   NA 140 10/23/2013   K 5.1  10/23/2013   CL 103 10/23/2013   CO2 22 10/23/2013   BUN 17 10/23/2013   CREATININE 1.63* 10/23/2013   PROT 7.6 10/21/2013   ALBUMIN 3.5 10/22/2013   BILITOT 0.3 10/21/2013   ALKPHOS 111 10/21/2013   AST 16 10/21/2013   ALT 10 10/21/2013  . Lab Results  Component Value Date   HGBA1C 6.4* 10/21/2013    Lab Results  Component Value Date   CHOL 118 10/22/2013   HDL 30* 10/22/2013   LDLCALC 50 10/22/2013   TRIG 192* 10/22/2013   CHOLHDL 3.9 10/22/2013   Lab Results  Component Value Date   TSH 4.350 10/22/2013     Total Time in preparing paper work, data evaluation and todays exam - 35 minutes  Thurnell Lose M.D on 10/23/2013 at 11:16 AM  [facility] Office  [phone]   **Disclaimer: This note may have been dictated with voice recognition software. Similar sounding words can inadvertently be transcribed and this note may contain transcription errors which may not have been corrected upon publication of note.**

## 2013-10-23 NOTE — Care Management Note (Signed)
    Page 1 of 1   10/23/2013     1:25:38 PM CARE MANAGEMENT NOTE 10/23/2013  Patient:  [name], [name]   Account Number:  [id number]  Date Initiated:  10/21/2013  Documentation initiated by:  Lorne Skeens  Subjective/Objective Assessment:   Patient was admitted with new onset seizures.  Lives at home with husband.     Action/Plan:   Will follow for discharge needs pending physician orders   Anticipated DC Date:  10/23/2013   Anticipated DC Plan:  HOME/SELF CARE         Choice offered to / List presented to:             Status of service:  Completed, signed off Medicare Important Message given?  NA - LOS <3 / Initial given by admissions (If response is "NO", the following Medicare IM given date fields will be blank) Date Medicare IM given:   Date Additional Medicare IM given:    Discharge Disposition:  HOME/SELF CARE  Per UR Regulation:    If discussed at Long Length of Stay Meetings, dates discussed:    Comments:

## 2013-10-23 NOTE — Progress Notes (Signed)
*  PRELIMINARY RESULTS* Vascular Ultrasound Carotid Duplex (Doppler) has been completed.   Findings suggest 1-39% stenosis bilaterally. Vertebral arteries are patent with antegrade flow.  10/22/2013 Sharion Dove, [place]

## 2013-10-23 NOTE — Progress Notes (Signed)
Discharge orders received. Pt for discharge home today. Pt did not have an IV since 0630. Pt given discharge instructions with verbalized understanding. Medications called in to pt's pharmacy, so presciptions not needed. Family in room to assist with discharge. Staff brought pt downstairs via wheelchair.

## 2013-11-19 ENCOUNTER — Other Ambulatory Visit (HOSPITAL_COMMUNITY): Payer: Self-pay | Admitting: Internal Medicine

## 2013-11-25 ENCOUNTER — Ambulatory Visit (INDEPENDENT_AMBULATORY_CARE_PROVIDER_SITE_OTHER): Payer: Medicare PPO | Admitting: Neurology

## 2013-11-25 ENCOUNTER — Encounter: Payer: Self-pay | Admitting: Neurology

## 2013-11-25 VITALS — BP 120/65 | HR 72 | Ht 59.0 in | Wt 202.0 lb

## 2013-11-25 DIAGNOSIS — R569 Unspecified convulsions: Secondary | ICD-10-CM

## 2013-11-25 NOTE — Progress Notes (Signed)
STROKE NEUROLOGY FOLLOW UP NOTE  NAME: [name] DOB: 1946-03-16  REASON FOR VISIT: seizure / stroke follow up HISTORY FROM: pt and chart  Today we had the pleasure of seeing [name] in follow-up at our Neurology Clinic. Pt was accompanied by no one.   History Summary 68 yo white F with PMH of CAD, HTN, DM, HLD, OSA, CKD stage IV and several episodes of LOC presented as hospital discharge follow up.  She stated that about 4 years ago, she had episode of pressure at back of head and then fell and became unresponsive. She was sent to [facility] and was told to have mini-stroke. Discharged with no medication. For the last 6 month, she had total about 20 similar episodes, 12 of them she had LOC and 8 out of the 20 she did not lose consciousness. With the ones she was awake, she stated that she felt her left leg started to jerk, shake, she will have to sit down, otherwise will fall, lasting 5-11min, no arm or face involvement, but has some slurry speech. She never seek for medical attention. On 10/21/13, when she came back from outside, at her front doorstep, she felt the episodes came again, she had to sit down, and then she woke up in the hospital. She can not remember what happened to her. As per chart, she had left face twitching, left arm and leg shaking. MRI and MRA normal, EEG normal and other work up unrevealing. She was treated with keppra and no more spells. She was discharged with ASA and lipitor as well as keppra.  Interval History During the interval time, the patient has been doing well. No seizure spells. She stated that she did have two falls but they are mechanical fall without LOC and not similar feeling of her episodes. She is on medications for HTN and DM, she stated that her sugar and BP level at home were good. She can not have contrast MRI done due to her kidney function.  REVIEW OF SYSTEMS: Full 14 system review of systems performed and notable only for those  listed below and in HPI above, all others are negative:  Constitutional: fatigue, appetite change  Cardiovascular: leg swelling  Ear/Nose/Throat: ear pain, runny nose Skin: N/A  Eyes: blurry vision  Respiratory: N/A  Gastroitestinal: N/A  Genitourinary: frequent urination Hematology/Lymphatic: N/A  Endocrine: cold intolerence, thirsty  Musculoskeletal: joint pain, back pain  Allergy/Immunology: N/A  Neurological: HA, passing out episodes  Psychiatric: agitation  The following represents the patient's updated allergies and side effects list: Allergies  Allergen Reactions  . Codeine Itching  . Meperidine Hcl Itching  . Morphine Itching  . Nylon Other (See Comments)    Stitches cause infection  . Penicillins Hives, Itching and Rash    Labs since last visit of relevance include the following: Results for orders placed during the hospital encounter of 10/21/13  URINE CULTURE      Result Value Ref Range   Specimen Description URINE, CLEAN CATCH     Special Requests NONE     Culture  Setup Time       Value: 10/22/2013 19:15     Performed at [place]       Value: NO GROWTH     Performed at [facility]   Culture       Value: NO GROWTH     Performed at [facility]   Report Status 10/23/2013 FINAL  PROTIME-INR      Result Value Ref Range   Prothrombin Time 14.1  11.6 - 15.2 seconds   INR 1.09  0.00 - 1.49  APTT      Result Value Ref Range   aPTT 30  24 - 37 seconds  CBC      Result Value Ref Range   WBC 8.1  4.0 - 10.5 K/uL   RBC 3.96  3.87 - 5.11 MIL/uL   Hemoglobin 11.6 (*) 12.0 - 15.0 g/dL   HCT 36.4  36.0 - 46.0 %   MCV 91.9  78.0 - 100.0 fL   MCH 29.3  26.0 - 34.0 pg   MCHC 31.9  30.0 - 36.0 g/dL   RDW 14.4  11.5 - 15.5 %   Platelets 210  150 - 400 K/uL  DIFFERENTIAL      Result Value Ref Range   Neutrophils Relative % 64  43 - 77 %   Neutro Abs 5.2  1.7 - 7.7 K/uL   Lymphocytes Relative 26  12 - 46 %   Lymphs  Abs 2.1  0.7 - 4.0 K/uL   Monocytes Relative 8  3 - 12 %   Monocytes Absolute 0.6  0.1 - 1.0 K/uL   Eosinophils Relative 2  0 - 5 %   Eosinophils Absolute 0.1  0.0 - 0.7 K/uL   Basophils Relative 0  0 - 1 %   Basophils Absolute 0.0  0.0 - 0.1 K/uL  COMPREHENSIVE METABOLIC PANEL      Result Value Ref Range   Sodium 140  137 - 147 mEq/L   Potassium 5.1  3.7 - 5.3 mEq/L   Chloride 103  96 - 112 mEq/L   CO2 23  19 - 32 mEq/L   Glucose, Bld 91  70 - 99 mg/dL   BUN 17  6 - 23 mg/dL   Creatinine, Ser 1.62 (*) 0.50 - 1.10 mg/dL   Calcium 9.1  8.4 - 10.5 mg/dL   Total Protein 7.6  6.0 - 8.3 g/dL   Albumin 4.0  3.5 - 5.2 g/dL   AST 16  0 - 37 U/L   ALT 10  0 - 35 U/L   Alkaline Phosphatase 111  39 - 117 U/L   Total Bilirubin 0.3  0.3 - 1.2 mg/dL   GFR calc non Af Amer 31 (*) >90 mL/min   GFR calc Af Amer 36 (*) >90 mL/min  MAGNESIUM      Result Value Ref Range   Magnesium 2.1  1.5 - 2.5 mg/dL  HEMOGLOBIN A1C      Result Value Ref Range   Hemoglobin A1C 6.4 (*) <5.7 %   Mean Plasma Glucose 137 (*) <117 mg/dL  CBC      Result Value Ref Range   WBC 9.0  4.0 - 10.5 K/uL   RBC 4.05  3.87 - 5.11 MIL/uL   Hemoglobin 11.9 (*) 12.0 - 15.0 g/dL   HCT 37.8  36.0 - 46.0 %   MCV 93.3  78.0 - 100.0 fL   MCH 29.4  26.0 - 34.0 pg   MCHC 31.5  30.0 - 36.0 g/dL   RDW 14.4  11.5 - 15.5 %   Platelets 208  150 - 400 K/uL  CREATININE, SERUM      Result Value Ref Range   Creatinine, Ser 1.58 (*) 0.50 - 1.10 mg/dL   GFR calc non Af Amer 32 (*) >90 mL/min   GFR calc Af Amer 37 (*) >  90 mL/min  GLUCOSE, CAPILLARY      Result Value Ref Range   Glucose-Capillary 68 (*) 70 - 99 mg/dL   Comment 1 Notify RN     Comment 2 Documented in Chart    LIPID PANEL      Result Value Ref Range   Cholesterol 118  0 - 200 mg/dL   Triglycerides 192 (*) <150 mg/dL   HDL 30 (*) >39 mg/dL   Total CHOL/HDL Ratio 3.9     VLDL 38  0 - 40 mg/dL   LDL Cholesterol 50  0 - 99 mg/dL  CBC WITH DIFFERENTIAL      Result  Value Ref Range   WBC 7.2  4.0 - 10.5 K/uL   RBC 3.67 (*) 3.87 - 5.11 MIL/uL   Hemoglobin 10.8 (*) 12.0 - 15.0 g/dL   HCT 34.4 (*) 36.0 - 46.0 %   MCV 93.7  78.0 - 100.0 fL   MCH 29.4  26.0 - 34.0 pg   MCHC 31.4  30.0 - 36.0 g/dL   RDW 14.6  11.5 - 15.5 %   Platelets 165  150 - 400 K/uL   Neutrophils Relative % 56  43 - 77 %   Neutro Abs 4.1  1.7 - 7.7 K/uL   Lymphocytes Relative 32  12 - 46 %   Lymphs Abs 2.3  0.7 - 4.0 K/uL   Monocytes Relative 8  3 - 12 %   Monocytes Absolute 0.6  0.1 - 1.0 K/uL   Eosinophils Relative 3  0 - 5 %   Eosinophils Absolute 0.2  0.0 - 0.7 K/uL   Basophils Relative 1  0 - 1 %   Basophils Absolute 0.0  0.0 - 0.1 K/uL  RENAL FUNCTION PANEL      Result Value Ref Range   Sodium 142  137 - 147 mEq/L   Potassium 4.8  3.7 - 5.3 mEq/L   Chloride 106  96 - 112 mEq/L   CO2 23  19 - 32 mEq/L   Glucose, Bld 141 (*) 70 - 99 mg/dL   BUN 17  6 - 23 mg/dL   Creatinine, Ser 1.71 (*) 0.50 - 1.10 mg/dL   Calcium 8.4  8.4 - 10.5 mg/dL   Phosphorus 3.6  2.3 - 4.6 mg/dL   Albumin 3.5  3.5 - 5.2 g/dL   GFR calc non Af Amer 29 (*) >90 mL/min   GFR calc Af Amer 34 (*) >90 mL/min  GLUCOSE, CAPILLARY      Result Value Ref Range   Glucose-Capillary 133 (*) 70 - 99 mg/dL   Comment 1 Notify RN     Comment 2 Documented in Chart    GLUCOSE, CAPILLARY      Result Value Ref Range   Glucose-Capillary 159 (*) 70 - 99 mg/dL   Comment 1 Notify RN     Comment 2 Documented in Chart    GLUCOSE, CAPILLARY      Result Value Ref Range   Glucose-Capillary 135 (*) 70 - 99 mg/dL   Comment 1 Notify RN     Comment 2 Documented in Chart    CREATININE, URINE, RANDOM      Result Value Ref Range   Creatinine, Urine 64.30    OSMOLALITY, URINE      Result Value Ref Range   Osmolality, Ur 367 (*) 390 - 1090 mOsm/kg  OSMOLALITY      Result Value Ref Range   Osmolality 298  275 - 300 mOsm/kg  SODIUM, URINE, RANDOM      Result Value Ref Range   Sodium, Ur 82    URINALYSIS, ROUTINE W  REFLEX MICROSCOPIC      Result Value Ref Range   Color, Urine YELLOW  YELLOW   APPearance CLEAR  CLEAR   Specific Gravity, Urine 1.015  1.005 - 1.030   pH 5.0  5.0 - 8.0   Glucose, UA 250 (*) NEGATIVE mg/dL   Hgb urine dipstick NEGATIVE  NEGATIVE   Bilirubin Urine NEGATIVE  NEGATIVE   Ketones, ur NEGATIVE  NEGATIVE mg/dL   Protein, ur NEGATIVE  NEGATIVE mg/dL   Urobilinogen, UA 0.2  0.0 - 1.0 mg/dL   Nitrite NEGATIVE  NEGATIVE   Leukocytes, UA NEGATIVE  NEGATIVE  TSH      Result Value Ref Range   TSH 4.350  0.350 - 4.500 uIU/mL  GLUCOSE, CAPILLARY      Result Value Ref Range   Glucose-Capillary 152 (*) 70 - 99 mg/dL   Comment 1 Documented in Chart     Comment 2 Notify RN    GLUCOSE, CAPILLARY      Result Value Ref Range   Glucose-Capillary 126 (*) 70 - 99 mg/dL   Comment 1 Documented in Chart     Comment 2 Notify RN    BASIC METABOLIC PANEL      Result Value Ref Range   Sodium 140  137 - 147 mEq/L   Potassium 5.1  3.7 - 5.3 mEq/L   Chloride 103  96 - 112 mEq/L   CO2 22  19 - 32 mEq/L   Glucose, Bld 115 (*) 70 - 99 mg/dL   BUN 17  6 - 23 mg/dL   Creatinine, Ser 1.63 (*) 0.50 - 1.10 mg/dL   Calcium 9.1  8.4 - 10.5 mg/dL   GFR calc non Af Amer 31 (*) >90 mL/min   GFR calc Af Amer 36 (*) >90 mL/min  GLUCOSE, CAPILLARY      Result Value Ref Range   Glucose-Capillary 123 (*) 70 - 99 mg/dL  GLUCOSE, CAPILLARY      Result Value Ref Range   Glucose-Capillary 119 (*) 70 - 99 mg/dL   Comment 1 Documented in Chart     Comment 2 Notify RN    GLUCOSE, CAPILLARY      Result Value Ref Range   Glucose-Capillary 153 (*) 70 - 99 mg/dL   Comment 1 Documented in Chart     Comment 2 Notify RN    I-STAT TROPOININ, ED      Result Value Ref Range   Troponin i, poc 0.01  0.00 - 0.08 ng/mL   Comment 3             The neurologically relevant items on the patient's problem list were reviewed on today's visit.  Neurologic Examination  A problem focused neurological exam (12 or more  points of the single system neurologic examination, vital signs counts as 1 point, cranial nerves count for 8 points) was performed.  Blood pressure 120/65, pulse 72, height 4\' 11"  (1.499 m), weight 202 lb (91.627 kg).  General - Well nourished, well developed, in no apparent distress.  Ophthalmologic - Sharp disc margins OU.  Cardiovascular - Regular rate and rhythm with no murmur.  Mental Status -  Level of arousal and orientation to time, place, and person were intact. Language including expression, naming, repetition, comprehension, reading, and writing was assessed and found intact.  Cranial Nerves II - XII - II -  Vision intact OU. III, IV, VI - Extraocular movements intact. V - Facial sensation intact bilaterally. VII - Facial movement intact bilaterally. VIII - Hearing & vestibular intact bilaterally. X - Palate elevates symmetrically. XI - Chin turning & shoulder shrug intact bilaterally. XII - Tongue protrusion intact.  Motor Strength - The patient's strength was normal in all extremities and pronator drift was absent.  Bulk was normal and fasciculations were absent.   Motor Tone - Muscle tone was assessed at the neck and appendages and was normal.  Reflexes - The patient's reflexes were normal in all extremities and she had no pathological reflexes.  Sensory - Light touch, temperature/pinprick, vibration and proprioception, and Romberg testing were assessed and were normal.    Coordination - The patient had normal movements in the hands and feet with no ataxia or dysmetria.  Tremor was absent.  Gait and Station - The patient's transfers, posture, gait, station, and turns were observed as normal.  Data reviewed: I personally reviewed the images and agree with the radiology interpretations.  MRI and MRA 09/2013 -  No acute infarct  Negative MRA of the head.  EEG - normal study  2D echo - Left ventricle: E/e&'>14 suggestive of elevated LV filling pressures. The  cavity size was normal. Systolic function was normal. The estimated ejection fraction was in the range of 55% to 60%. There was an increased relative contribution of atrial contraction to ventricular filling. Doppler parameters are consistent with abnormal left ventricular relaxation (grade 1 diastolic dysfunction). - Pericardium, extracardiac: A trivial, free-flowing pericardial effusion was identified along the left ventricular free wall. The fluid had no internal echoes.There was no evidence of hemodynamic Compromise.  CUS  Right: mild calcific plaque origin and proximal ICA and ECA. Left: mild mixed plaque origin ICA. Bilateral: 1-39% ICA stenosis. Vertebral artery flow is antegrade. ICA/CCA ratio: R-1.57 L-1.84.  Component     Latest Ref Rng 10/21/2013 10/22/2013  Cholesterol     0 - 200 mg/dL  118  Triglycerides     <150 mg/dL  192 (H)  HDL     >39 mg/dL  30 (L)  Total CHOL/HDL Ratio       3.9  VLDL     0 - 40 mg/dL  38  LDL (calc)     0 - 99 mg/dL  50  Hemoglobin A1C     <5.7 % 6.4 (H)   Mean Plasma Glucose     <117 mg/dL 137 (H)   TSH     0.350 - 4.500 uIU/mL  4.350    Assessment: As you may recall, she is a 68 y.o. Caucasian female with a diagnosis of focal seizure. She had episode of left leg jerking, with or without LOC for about 4 years and more frequent for the last 6 months. She was admitted last month for LOC with left face twitch, left arm and leg jerking. Was given keppra IV 1000mg  load and then on keppra 500mg  bid. She does have stroke risk factors so she had also stroke work up which was negative. MRI and EEG negative. She can not have contrast MRI due to kidney function. She had no more spells since discharge. Ideally, she should have a contrast imaging study done, CT or MRI contrast, but due to CKD and normal neuro exam, will hold off on that. DDx also include syncope, pre-syncope and pt is followed by cardiology also. Will continue her keppra but also  recommend continue ASA and lipitor for stroke prevention.  Plan:  - continue keppra 500mg  bid for seizure control - continue ASA and lipitor for stroke prevention - follow up with cardiologist and PCP - no driving according to [place] law - RTC in 3 months.  Diagnoses from this visit: No diagnosis found.  No orders of the defined types were placed in this encounter.   Patient Instructions  1. Continue ASA and lipitor for stroke prevention 2. Continue keppra for seizure control 3. Pt is currently not driving and please do not drive until you are reevaluated on this issue. 4. Continue follow up with cardiologist and PCP as scheduled. 5. Follow up in clinic for 3 months.   Rosalin Hawking, MD PhD [facility] [address], [place], [place] [address] [phone]

## 2013-11-25 NOTE — Patient Instructions (Signed)
1. Continue ASA and lipitor for stroke prevention 2. Continue keppra for seizure control 3. Pt is currently not driving and please do not drive until you are reevaluated on this issue. 4. Continue follow up with cardiologist and PCP as scheduled. 5. Follow up in clinic for 3 months.

## 2013-11-26 ENCOUNTER — Telehealth: Payer: Self-pay | Admitting: Neurology

## 2013-11-26 MED ORDER — LEVETIRACETAM 500 MG PO TABS: 500.0000 mg | ORAL_TABLET | Freq: Two times a day (BID) | ORAL | Status: DC

## 2013-11-26 NOTE — Telephone Encounter (Signed)
OV note says:  Plan:  - continue keppra 500mg  bid for seizure control Rx has been sent.  I called the patent back. She is aware.

## 2013-11-26 NOTE — Telephone Encounter (Signed)
Patient calling to state that Dr. Erlinda Hong said he would call in a script for Keppra to her pharmacy but states that he never did, please return call to patient and advise, states that it is ok to leave a voicemail message if she doesn't answer.

## 2013-12-04 ENCOUNTER — Encounter: Payer: Self-pay | Admitting: *Deleted

## 2013-12-08 ENCOUNTER — Institutional Professional Consult (permissible substitution): Payer: Medicare PPO | Admitting: Cardiology

## 2013-12-10 ENCOUNTER — Encounter: Payer: Self-pay | Admitting: Cardiology

## 2013-12-10 ENCOUNTER — Ambulatory Visit (INDEPENDENT_AMBULATORY_CARE_PROVIDER_SITE_OTHER): Payer: Medicare PPO | Admitting: Cardiology

## 2013-12-10 VITALS — BP 100/60 | HR 57 | Ht 59.0 in | Wt 201.0 lb

## 2013-12-10 DIAGNOSIS — I2510 Atherosclerotic heart disease of native coronary artery without angina pectoris: Secondary | ICD-10-CM

## 2013-12-10 DIAGNOSIS — R079 Chest pain, unspecified: Secondary | ICD-10-CM

## 2013-12-10 DIAGNOSIS — I10 Essential (primary) hypertension: Secondary | ICD-10-CM

## 2013-12-10 DIAGNOSIS — I498 Other specified cardiac arrhythmias: Secondary | ICD-10-CM

## 2013-12-10 NOTE — Progress Notes (Signed)
HPI  the patient presents for followup after a hospitalization in the spring. I have reviewed these records area she was hospitalized with syncope and was treated eventually for seizures. I do note that she had an echocardiogram which demonstrated trivial pericardial effusion.   However, the patient had no apparent cardiovascular complaints that I can find. She was referred for followup of the effusion. She said that since she was started on Keppra she has had no further syncopal episodes. She does not report any chest pressure, neck or arm discomfort. She does not report any palpitations or presyncope. She does have some dyspnea on exertion but this has not changed from previous. She's not having any PND or orthopnea. She's had no weight gain or edema.  Allergies  Allergen Reactions  . Codeine Itching  . Meperidine Hcl Itching  . Morphine Itching  . Nylon Other (See Comments)    Stitches cause infection  . Penicillins Hives, Itching and Rash    Current Outpatient Prescriptions  Medication Sig Dispense Refill  . ACCU-CHEK SMARTVIEW test strip       . ALPRAZolam (XANAX) 0.5 MG tablet Take 0.5 mg by mouth at bedtime as needed for anxiety.      Marland Kitchen aspirin 81 MG tablet Take 81 mg by mouth daily.        Marland Kitchen atorvastatin (LIPITOR) 20 MG tablet       . Cholecalciferol (VITAMIN D-3) 5000 UNITS TABS Take by mouth.      . desvenlafaxine (PRISTIQ) 50 MG 24 hr tablet Take 50 mg by mouth daily.        Marland Kitchen docusate sodium (COLACE) 100 MG capsule Take 100 mg by mouth 3 (three) times daily.      . fluticasone (FLONASE) 50 MCG/ACT nasal spray Place 2 sprays into both nostrils daily.      . furosemide (LASIX) 40 MG tablet Take 40 mg by mouth daily.        Marland Kitchen glimepiride (AMARYL) 2 MG tablet Take 2 mg by mouth daily.      . iron polysaccharides (NIFEREX) 150 MG capsule Take 150 mg by mouth daily.      Marland Kitchen levETIRAcetam (KEPPRA) 500 MG tablet Take 1 tablet (500 mg total) by mouth 2 (two) times daily.  60 tablet  2   . Levothyroxine Sodium 112 MCG CAPS Take by mouth daily.        . meclizine (ANTIVERT) 25 MG tablet Take 25 mg by mouth 3 (three) times daily as needed for dizziness. 1/2 to one tid      . metoprolol tartrate (LOPRESSOR) 25 MG tablet Take 1 tablet (25 mg total) by mouth 2 (two) times daily.  60 tablet  0  . ONGLYZA 2.5 MG TABS tablet Take 2.5 mg by mouth daily.      Marland Kitchen rOPINIRole (REQUIP) 0.25 MG tablet Take 0.25 mg by mouth at bedtime.        . tizanidine (ZANAFLEX) 2 MG capsule Take 2 mg by mouth at bedtime.      . traMADol (ULTRAM) 50 MG tablet Take 50 mg by mouth every 6 (six) hours as needed.      . traZODone (DESYREL) 50 MG tablet Take 50 mg by mouth 2 (two) times daily. Patient takes 1 pill and a half.      . losartan (COZAAR) 50 MG tablet Take 1 tablet (50 mg total) by mouth daily.  30 tablet  11   No current facility-administered medications for this visit.  Past Medical History  Diagnosis Date  . CAD (coronary artery disease)      (Nonobstructive coronary artery disease 40% LAD stenosis)  2011  . HTN (hypertension)   . OSA (obstructive sleep apnea)   . DM (diabetes mellitus)   . Hypothyroidism   . Allergic rhinitis   . Back pain   . DJD (degenerative joint disease)   . Stroke   . CHF (congestive heart failure)   . Myocardial infarction 2013  . Depression   . Anxiety   . Chronic kidney disease     STAGE 4 KIDNEY DISEASE  . GERD (gastroesophageal reflux disease)   . H/O hiatal hernia   . FUXNATFT(732.2)     Past Surgical History  Procedure Laterality Date  . Cervical disc surgery    . Carpal tunnel release    . Uvulopalatopharyngoplasty    . Salpingoophorectomy    . Abdominal hysterectomy    . Replacement total knee      ROS:   Or back pain , rhinitis , headaches, dizziness , difficulty swallowing , constipation.  Otherwise, as stated in the HPI and negative for all other systems.   PHYSICAL EXAM BP 100/60  Pulse 57  Ht 4\' 11"  (1.499 m)  Wt 201 lb  (91.173 kg)  BMI 40.58 kg/m2 GENERAL:  Well appearing HEENT:  Pupils equal round and reactive, fundi not visualized, oral mucosa unremarkable NECK:  No jugular venous distention, waveform within normal limits, carotid upstroke brisk and symmetric, no bruits, no thyromegaly LYMPHATICS:  No cervical, inguinal adenopathy LUNGS:  Clear to auscultation bilaterally BACK:  No CVA tenderness CHEST:  Unremarkable HEART:  PMI not displaced or sustained,S1 and S2 within normal limits, no S3, no S4, no clicks, no rubs, no murmurs ABD:  Flat, positive bowel sounds normal in frequency in pitch, no bruits, no rebound, no guarding, no midline pulsatile mass, no hepatomegaly, no splenomegaly EXT:  2 plus pulses throughout, no edema, no cyanosis no clubbing SKIN:  No rashes no nodules NEURO:  Cranial nerves II through XII grossly intact, motor grossly intact throughout PSYCH:  Cognitively intact, oriented to person place and time   EKG:   Sinus rhythm, rate 57, axis witin normal limits , intervals within normal limits, low voltage.   No acute ST-T wave changes.  Of note the low voltage is not changed compared with previous EKGs. 12/10/2013   ASSESSMENT AND PLAN  CAD:   The patient has had no new cardiovascular symptoms since her catheterization in 2011. Given this no further testing is indicated.  PERICARDIAL EFFUSION:   She had a trivial pericardial effusion on the recent echo. No change in therapy is indicated.   Greater than 1 hour with the patient and chart with greater than 50% of the ime face-to-face. Otherwise no change in therapy.

## 2013-12-10 NOTE — Patient Instructions (Signed)
Your physician recommends that you schedule a follow-up appointment in: 2 years with Dr. Hochrein  

## 2013-12-23 ENCOUNTER — Other Ambulatory Visit (HOSPITAL_COMMUNITY): Payer: Self-pay | Admitting: Internal Medicine

## 2014-01-05 ENCOUNTER — Other Ambulatory Visit: Payer: Self-pay | Admitting: Family Medicine

## 2014-01-05 ENCOUNTER — Ambulatory Visit
Admission: RE | Admit: 2014-01-05 | Discharge: 2014-01-05 | Disposition: A | Discharge status: Home / Self Care | Payer: Medicare PPO | Source: Ambulatory Visit | Attending: Family Medicine | Admitting: Family Medicine

## 2014-01-05 DIAGNOSIS — M542 Cervicalgia: Secondary | ICD-10-CM

## 2014-01-05 DIAGNOSIS — M546 Pain in thoracic spine: Secondary | ICD-10-CM

## 2014-02-12 ENCOUNTER — Other Ambulatory Visit: Payer: Self-pay

## 2014-02-22 ENCOUNTER — Other Ambulatory Visit: Payer: Self-pay | Admitting: Neurology

## 2014-03-01 ENCOUNTER — Ambulatory Visit (INDEPENDENT_AMBULATORY_CARE_PROVIDER_SITE_OTHER): Payer: Medicare PPO | Admitting: Neurology

## 2014-03-01 ENCOUNTER — Encounter: Payer: Self-pay | Admitting: Neurology

## 2014-03-01 VITALS — BP 131/60 | HR 56 | Ht 59.0 in | Wt 206.8 lb

## 2014-03-01 DIAGNOSIS — E785 Hyperlipidemia, unspecified: Secondary | ICD-10-CM

## 2014-03-01 DIAGNOSIS — I10 Essential (primary) hypertension: Secondary | ICD-10-CM

## 2014-03-01 DIAGNOSIS — E119 Type 2 diabetes mellitus without complications: Secondary | ICD-10-CM

## 2014-03-01 DIAGNOSIS — R569 Unspecified convulsions: Secondary | ICD-10-CM

## 2014-03-01 NOTE — Patient Instructions (Signed)
-   you are doing well from our standard - please continue ASA and lipitor for stroke prevention - continue the keppra for seizure control. We may think about taper off medication in 2 year if you no seizure episodes - Follow up with your primary care physician for stroke risk factor modification. Recommend maintain blood pressure goal <130/80, diabetes with hemoglobin A1c goal below 6.5% and lipids with LDL cholesterol goal below 70 mg/dL.  - follow up in 2 months

## 2014-03-01 NOTE — Progress Notes (Signed)
STROKE NEUROLOGY FOLLOW UP NOTE  NAME: [name] DOB: 10-10-1945  REASON FOR VISIT: seizure / stroke follow up HISTORY FROM: pt and chart  Today we had the pleasure of seeing [name] in follow-up at our Neurology [facility]. Pt was accompanied by no one.   History Summary 68 yo white F with PMH of CAD, HTN, DM, HLD, OSA, CKD stage IV and several episodes of LOC presented as hospital discharge follow up.  She stated that about 4 years ago, she had episode of pressure at back of head and then fell and became unresponsive. She was sent to [facility] and was told to have mini-stroke. Discharged with no medication. For the last 6 month, she had total about 20 similar episodes, 12 of them she had LOC and 8 out of the 20 she did not lose consciousness. With the ones she was awake, she stated that she felt her left leg started to jerk, shake, she will have to sit down, otherwise will fall, lasting 5-39min, no arm or face involvement, but has some slurry speech. She never seek for medical attention. On 10/21/13, when she came back from outside, at her front doorstep, she felt the episodes came again, she had to sit down, and then she woke up in the hospital. She can not remember what happened to her. As per chart, she had left face twitching, left arm and leg shaking. MRI and MRA normal, EEG normal and other work up unrevealing. She was treated with keppra and no more spells. She was discharged with ASA and lipitor as well as keppra.  11/25/13 follow up - the patient has been doing well. No seizure spells. She stated that she did have two falls but they are mechanical fall without LOC and not similar feeling of her episodes. She is on medications for HTN and DM, she stated that her sugar and BP level at home were good. She can not have contrast MRI done due to her kidney function.  Interval History During the interval time, the patient has been doing well. No recurrent seizure episodes. She is  not driving. No neurological complains. Followed up with cardiology in 11/2013 and was doing well from cardiology standpoint. Next visit with PCP in Jan 2016. BP in clinic today 131/60.  REVIEW OF SYSTEMS: Full 14 system review of systems performed and notable only for those listed below and in HPI above, all others are negative:  Constitutional: fatigue, appetite change  Cardiovascular: leg swelling  Ear/Nose/Throat: ear pain, runny nose Skin: N/A  Eyes: blurry vision  Respiratory: N/A  Gastroitestinal: N/A  Genitourinary: frequent urination Hematology/Lymphatic: N/A  Endocrine: cold intolerence, thirsty  Musculoskeletal: joint pain, back pain  Allergy/Immunology: N/A  Neurological: HA, passing out episodes  Psychiatric: agitation  The following represents the patient's updated allergies and side effects list: Allergies  Allergen Reactions  . Codeine Itching  . Meperidine Hcl Itching  . Morphine Itching  . Nylon Other (See Comments)    Stitches cause infection  . Penicillins Hives, Itching and Rash    Labs since last visit of relevance include the following: Results for orders placed or performed during the hospital encounter of 10/21/13  Urine culture  Result Value Ref Range   Specimen Description URINE, CLEAN CATCH    Special Requests NONE    Culture  Setup Time      10/22/2013 19:15 Performed at [place] Performed at [facility]  Culture NO GROWTH Performed at [facility]    Report Status 10/23/2013 FINAL   Protime-INR  Result Value Ref Range   Prothrombin Time 14.1 11.6 - 15.2 seconds   INR 1.09 0.00 - 1.49  APTT  Result Value Ref Range   aPTT 30 24 - 37 seconds  CBC  Result Value Ref Range   WBC 8.1 4.0 - 10.5 K/uL   RBC 3.96 3.87 - 5.11 MIL/uL   Hemoglobin 11.6 (L) 12.0 - 15.0 g/dL   HCT 36.4 36.0 - 46.0 %   MCV 91.9 78.0 - 100.0 fL   MCH 29.3 26.0 - 34.0 pg   MCHC 31.9 30.0 - 36.0 g/dL   RDW  14.4 11.5 - 15.5 %   Platelets 210 150 - 400 K/uL  Differential  Result Value Ref Range   Neutrophils Relative % 64 43 - 77 %   Neutro Abs 5.2 1.7 - 7.7 K/uL   Lymphocytes Relative 26 12 - 46 %   Lymphs Abs 2.1 0.7 - 4.0 K/uL   Monocytes Relative 8 3 - 12 %   Monocytes Absolute 0.6 0.1 - 1.0 K/uL   Eosinophils Relative 2 0 - 5 %   Eosinophils Absolute 0.1 0.0 - 0.7 K/uL   Basophils Relative 0 0 - 1 %   Basophils Absolute 0.0 0.0 - 0.1 K/uL  Comprehensive metabolic panel  Result Value Ref Range   Sodium 140 137 - 147 mEq/L   Potassium 5.1 3.7 - 5.3 mEq/L   Chloride 103 96 - 112 mEq/L   CO2 23 19 - 32 mEq/L   Glucose, Bld 91 70 - 99 mg/dL   BUN 17 6 - 23 mg/dL   Creatinine, Ser 1.62 (H) 0.50 - 1.10 mg/dL   Calcium 9.1 8.4 - 10.5 mg/dL   Total Protein 7.6 6.0 - 8.3 g/dL   Albumin 4.0 3.5 - 5.2 g/dL   AST 16 0 - 37 U/L   ALT 10 0 - 35 U/L   Alkaline Phosphatase 111 39 - 117 U/L   Total Bilirubin 0.3 0.3 - 1.2 mg/dL   GFR calc non Af Amer 31 (L) >90 mL/min   GFR calc Af Amer 36 (L) >90 mL/min  Magnesium  Result Value Ref Range   Magnesium 2.1 1.5 - 2.5 mg/dL  Hemoglobin A1c  Result Value Ref Range   Hgb A1c MFr Bld 6.4 (H) <5.7 %   Mean Plasma Glucose 137 (H) <117 mg/dL  CBC  Result Value Ref Range   WBC 9.0 4.0 - 10.5 K/uL   RBC 4.05 3.87 - 5.11 MIL/uL   Hemoglobin 11.9 (L) 12.0 - 15.0 g/dL   HCT 37.8 36.0 - 46.0 %   MCV 93.3 78.0 - 100.0 fL   MCH 29.4 26.0 - 34.0 pg   MCHC 31.5 30.0 - 36.0 g/dL   RDW 14.4 11.5 - 15.5 %   Platelets 208 150 - 400 K/uL  Creatinine, serum  Result Value Ref Range   Creatinine, Ser 1.58 (H) 0.50 - 1.10 mg/dL   GFR calc non Af Amer 32 (L) >90 mL/min   GFR calc Af Amer 37 (L) >90 mL/min  Glucose, capillary  Result Value Ref Range   Glucose-Capillary 68 (L) 70 - 99 mg/dL   Comment 1 Notify RN    Comment 2 Documented in Chart   Fasting lipid panel  Result Value Ref Range   Cholesterol 118 0 - 200 mg/dL   Triglycerides 192 (H) <150  mg/dL  HDL 30 (L) >39 mg/dL   Total CHOL/HDL Ratio 3.9 RATIO   VLDL 38 0 - 40 mg/dL   LDL Cholesterol 50 0 - 99 mg/dL  CBC with Differential  Result Value Ref Range   WBC 7.2 4.0 - 10.5 K/uL   RBC 3.67 (L) 3.87 - 5.11 MIL/uL   Hemoglobin 10.8 (L) 12.0 - 15.0 g/dL   HCT 34.4 (L) 36.0 - 46.0 %   MCV 93.7 78.0 - 100.0 fL   MCH 29.4 26.0 - 34.0 pg   MCHC 31.4 30.0 - 36.0 g/dL   RDW 14.6 11.5 - 15.5 %   Platelets 165 150 - 400 K/uL   Neutrophils Relative % 56 43 - 77 %   Neutro Abs 4.1 1.7 - 7.7 K/uL   Lymphocytes Relative 32 12 - 46 %   Lymphs Abs 2.3 0.7 - 4.0 K/uL   Monocytes Relative 8 3 - 12 %   Monocytes Absolute 0.6 0.1 - 1.0 K/uL   Eosinophils Relative 3 0 - 5 %   Eosinophils Absolute 0.2 0.0 - 0.7 K/uL   Basophils Relative 1 0 - 1 %   Basophils Absolute 0.0 0.0 - 0.1 K/uL  Renal function panel  Result Value Ref Range   Sodium 142 137 - 147 mEq/L   Potassium 4.8 3.7 - 5.3 mEq/L   Chloride 106 96 - 112 mEq/L   CO2 23 19 - 32 mEq/L   Glucose, Bld 141 (H) 70 - 99 mg/dL   BUN 17 6 - 23 mg/dL   Creatinine, Ser 1.71 (H) 0.50 - 1.10 mg/dL   Calcium 8.4 8.4 - 10.5 mg/dL   Phosphorus 3.6 2.3 - 4.6 mg/dL   Albumin 3.5 3.5 - 5.2 g/dL   GFR calc non Af Amer 29 (L) >90 mL/min   GFR calc Af Amer 34 (L) >90 mL/min  Glucose, capillary  Result Value Ref Range   Glucose-Capillary 133 (H) 70 - 99 mg/dL   Comment 1 Notify RN    Comment 2 Documented in Chart   Glucose, capillary  Result Value Ref Range   Glucose-Capillary 159 (H) 70 - 99 mg/dL   Comment 1 Notify RN    Comment 2 Documented in Chart   Glucose, capillary  Result Value Ref Range   Glucose-Capillary 135 (H) 70 - 99 mg/dL   Comment 1 Notify RN    Comment 2 Documented in Chart   Creatinine, urine, random  Result Value Ref Range   Creatinine, Urine 64.30 mg/dL  Osmolality, urine  Result Value Ref Range   Osmolality, Ur 367 (L) 390 - 1090 mOsm/kg  Osmolality  Result Value Ref Range   Osmolality 298 275 - 300  mOsm/kg  Sodium, urine, random  Result Value Ref Range   Sodium, Ur 82 mEq/L  Urinalysis, Routine w reflex microscopic  Result Value Ref Range   Color, Urine YELLOW YELLOW   APPearance CLEAR CLEAR   Specific Gravity, Urine 1.015 1.005 - 1.030   pH 5.0 5.0 - 8.0   Glucose, UA 250 (A) NEGATIVE mg/dL   Hgb urine dipstick NEGATIVE NEGATIVE   Bilirubin Urine NEGATIVE NEGATIVE   Ketones, ur NEGATIVE NEGATIVE mg/dL   Protein, ur NEGATIVE NEGATIVE mg/dL   Urobilinogen, UA 0.2 0.0 - 1.0 mg/dL   Nitrite NEGATIVE NEGATIVE   Leukocytes, UA NEGATIVE NEGATIVE  TSH  Result Value Ref Range   TSH 4.350 0.350 - 4.500 uIU/mL  Glucose, capillary  Result Value Ref Range   Glucose-Capillary 152 (H) 70 -  99 mg/dL   Comment 1 Documented in Chart    Comment 2 Notify RN   Glucose, capillary  Result Value Ref Range   Glucose-Capillary 126 (H) 70 - 99 mg/dL   Comment 1 Documented in Chart    Comment 2 Notify RN   Basic metabolic panel  Result Value Ref Range   Sodium 140 137 - 147 mEq/L   Potassium 5.1 3.7 - 5.3 mEq/L   Chloride 103 96 - 112 mEq/L   CO2 22 19 - 32 mEq/L   Glucose, Bld 115 (H) 70 - 99 mg/dL   BUN 17 6 - 23 mg/dL   Creatinine, Ser 1.63 (H) 0.50 - 1.10 mg/dL   Calcium 9.1 8.4 - 10.5 mg/dL   GFR calc non Af Amer 31 (L) >90 mL/min   GFR calc Af Amer 36 (L) >90 mL/min  Glucose, capillary  Result Value Ref Range   Glucose-Capillary 123 (H) 70 - 99 mg/dL  Glucose, capillary  Result Value Ref Range   Glucose-Capillary 119 (H) 70 - 99 mg/dL   Comment 1 Documented in Chart    Comment 2 Notify RN   Glucose, capillary  Result Value Ref Range   Glucose-Capillary 153 (H) 70 - 99 mg/dL   Comment 1 Documented in Chart    Comment 2 Notify RN   I-stat troponin, ED (not at [facility])  Result Value Ref Range   Troponin i, poc 0.01 0.00 - 0.08 ng/mL   Comment 3            The neurologically relevant items on the patient's problem list were reviewed on today's visit.  Neurologic  Examination  A problem focused neurological exam (12 or more points of the single system neurologic examination, vital signs counts as 1 point, cranial nerves count for 8 points) was performed.  Blood pressure 131/60, pulse 56, height 4\' 11"  (1.499 m), weight 206 lb 12.8 oz (93.804 kg).  General - Well nourished, well developed, in no apparent distress.  Ophthalmologic - Sharp disc margins OU.  Cardiovascular - Regular rate and rhythm with no murmur.  Mental Status -  Level of arousal and orientation to time, place, and person were intact. Language including expression, naming, repetition, comprehension, reading, and writing was assessed and found intact.  Cranial Nerves II - XII - II - Vision intact OU. III, IV, VI - Extraocular movements intact. V - Facial sensation intact bilaterally. VII - Facial movement intact bilaterally. VIII - Hearing & vestibular intact bilaterally. X - Palate elevates symmetrically. XI - Chin turning & shoulder shrug intact bilaterally. XII - Tongue protrusion intact.  Motor Strength - The patient's strength was normal in all extremities and pronator drift was absent.  Bulk was normal and fasciculations were absent.   Motor Tone - Muscle tone was assessed at the neck and appendages and was normal.  Reflexes - The patient's reflexes were normal in all extremities and she had no pathological reflexes.  Sensory - Light touch, temperature/pinprick, vibration and proprioception, and Romberg testing were assessed and were normal.    Coordination - The patient had normal movements in the hands and feet with no ataxia or dysmetria.  Tremor was absent.  Gait and Station - The patient's transfers, posture, gait, station, and turns were observed as normal.  Data reviewed: I personally reviewed the images and agree with the radiology interpretations.  MRI and MRA 09/2013 -  No acute infarct  Negative MRA of the head.  EEG - normal study  2D echo - Left  ventricle: E/e&'>14 suggestive of elevated LV filling pressures. The cavity size was normal. Systolic function was normal. The estimated ejection fraction was in the range of 55% to 60%. There was an increased relative contribution of atrial contraction to ventricular filling. Doppler parameters are consistent with abnormal left ventricular relaxation (grade 1 diastolic dysfunction). - Pericardium, extracardiac: A trivial, free-flowing pericardial effusion was identified along the left ventricular free wall. The fluid had no internal echoes.There was no evidence of hemodynamic Compromise.  CUS  Right: mild calcific plaque origin and proximal ICA and ECA. Left: mild mixed plaque origin ICA. Bilateral: 1-39% ICA stenosis. Vertebral artery flow is antegrade. ICA/CCA ratio: R-1.57 L-1.84.  Component     Latest Ref Rng 10/21/2013 10/22/2013  Cholesterol     0 - 200 mg/dL  118  Triglycerides     <150 mg/dL  192 (H)  HDL     >39 mg/dL  30 (L)  Total CHOL/HDL Ratio       3.9  VLDL     0 - 40 mg/dL  38  LDL (calc)     0 - 99 mg/dL  50  Hemoglobin A1C     <5.7 % 6.4 (H)   Mean Plasma Glucose     <117 mg/dL 137 (H)   TSH     0.350 - 4.500 uIU/mL  4.350    Assessment: As you may recall, she is a 68 y.o. Caucasian female with a diagnosis of focal seizure. She had episode of left leg jerking, with or without LOC for about 4 years and more frequent for the last 6 months. She was admitted in 09/2013 for LOC with left face twitch, left arm and leg jerking. Was given keppra IV 1000mg  load and then on keppra 500mg  bid. She does have stroke risk factors so she had also stroke work up which was negative. MRI and EEG negative. She can not have contrast MRI due to kidney function. She had no more spells since discharge. And she has about 4.5 months of seizure free now. She is not driving. Will continue her keppra for seizure control and continue ASA and lipitor for stroke prevention.  Plan:  -  continue keppra 500mg  bid for seizure control - continue ASA and lipitor for stroke prevention - Follow up with your primary care physician for stroke risk factor modification. Recommend maintain blood pressure goal <130/80, diabetes with hemoglobin A1c goal below 6.5% and lipids with LDL cholesterol goal below 70 mg/dL.  - no driving according to [place] law unless seizure free for 6 months and under physician's care - RTC in 2 months for evaluation of the possibility of driving.  No orders of the defined types were placed in this encounter.   Patient Instructions  - you are doing well from our standard - please continue ASA and lipitor for stroke prevention - continue the keppra for seizure control. We may think about taper off medication in 2 year if you no seizure episodes - Follow up with your primary care physician for stroke risk factor modification. Recommend maintain blood pressure goal <130/80, diabetes with hemoglobin A1c goal below 6.5% and lipids with LDL cholesterol goal below 70 mg/dL.  - follow up in 2 months    Rosalin Hawking, MD PhD [facility] [address] [place], [place] [address] [phone]

## 2014-03-17 ENCOUNTER — Other Ambulatory Visit: Payer: Self-pay | Admitting: Neurology

## 2014-05-11 ENCOUNTER — Ambulatory Visit (INDEPENDENT_AMBULATORY_CARE_PROVIDER_SITE_OTHER): Payer: Medicare PPO | Admitting: Neurology

## 2014-05-11 ENCOUNTER — Encounter: Payer: Self-pay | Admitting: Neurology

## 2014-05-11 VITALS — BP 119/75 | HR 62 | Ht 59.5 in | Wt 206.6 lb

## 2014-05-11 DIAGNOSIS — E119 Type 2 diabetes mellitus without complications: Secondary | ICD-10-CM

## 2014-05-11 DIAGNOSIS — I10 Essential (primary) hypertension: Secondary | ICD-10-CM

## 2014-05-11 DIAGNOSIS — E785 Hyperlipidemia, unspecified: Secondary | ICD-10-CM

## 2014-05-11 DIAGNOSIS — R569 Unspecified convulsions: Secondary | ICD-10-CM

## 2014-05-11 NOTE — Patient Instructions (Signed)
-   continue ASA and lipitor for stroke prevention - continue keppra 500mg  bid for seizure control - you have been 6 months free of seizure, you may resume driving but take the medication as prescribed. - Follow up with your primary care physician for stroke risk factor modification. Recommend maintain blood pressure goal <130/80, diabetes with hemoglobin A1c goal below 6.5% and lipids with LDL cholesterol goal below 70 mg/dL.  - check BP and glucose at home - follow up in one year

## 2014-05-11 NOTE — Progress Notes (Signed)
STROKE NEUROLOGY FOLLOW UP NOTE  NAME: [name] DOB: 05/27/1945  REASON FOR VISIT: seizure / stroke follow up HISTORY FROM: pt and chart  Today we had the pleasure of seeing [name] in follow-up at our Neurology Clinic. Pt was accompanied by no one.   History Summary 69 yo white F with PMH of CAD, HTN, DM, HLD, OSA, CKD stage IV and several episodes of LOC presented as hospital discharge follow up.  She stated that about 4 years ago, she had episode of pressure at back of head and then fell and became unresponsive. She was sent to [facility] and was told to have mini-stroke. Discharged with no medication. For the last 6 month, she had total about 20 similar episodes, 12 of them she had LOC and 8 out of the 20 she did not lose consciousness. With the ones she was awake, she stated that she felt her left leg started to jerk, shake, she will have to sit down, otherwise will fall, lasting 5-37min, no arm or face involvement, but has some slurry speech. She never seek for medical attention. On 10/21/13, when she came back from outside, at her front doorstep, she felt the episodes came again, she had to sit down, and then she woke up in the hospital. She can not remember what happened to her. As per chart, she had left face twitching, left arm and leg shaking. MRI and MRA normal, EEG normal and other work up unrevealing. She was treated with keppra and no more spells. She was discharged with ASA and lipitor as well as keppra.  11/25/13 follow up - the patient has been doing well. No seizure spells. She stated that she did have two falls but they are mechanical fall without LOC and not similar feeling of her episodes. She is on medications for HTN and DM, she stated that her sugar and BP level at home were good. She can not have contrast MRI done due to her kidney function.  03/01/14 follow up - the patient has been doing well. No recurrent seizure episodes. She is not driving. No  neurological complains. Followed up with cardiology in 11/2013 and was doing well from cardiology standpoint. Next visit with PCP in Jan 2016. BP in clinic today 131/60.  Interval History During the interval time, pt was doing well. No recurrent seizure episodes. Requesting to back to drive. No complains. BP 119/75.  REVIEW OF SYSTEMS: Full 14 system review of systems performed and notable only for those listed below and in HPI above, all others are negative:  Constitutional: fatigue, appetite change  Cardiovascular: leg swelling  Ear/Nose/Throat: ear pain, runny nose Skin: N/A  Eyes: light sensitivity  Respiratory: N/A  Gastroitestinal: N/A  Genitourinary: frequent urination Hematology/Lymphatic: N/A  Endocrine: excessive thirst  Musculoskeletal: joint pain, back pain  Allergy/Immunology: N/A  Neurological: restless legs, daytime sleepiness  Psychiatric: anxious/nervous  The following represents the patient's updated allergies and side effects list: Allergies  Allergen Reactions  . Codeine Itching  . Meperidine Hcl Itching  . Morphine Itching  . Nylon Other (See Comments)    Stitches cause infection  . Penicillins Hives, Itching and Rash    Labs since last visit of relevance include the following: Results for orders placed or performed during the hospital encounter of 10/21/13  Urine culture  Result Value Ref Range   Specimen Description URINE, CLEAN CATCH    Special Requests NONE    Culture  Setup Time  10/22/2013 19:15 Performed at [place] Performed at [facility]    Culture NO GROWTH Performed at [facility]    Report Status 10/23/2013 FINAL   Protime-INR  Result Value Ref Range   Prothrombin Time 14.1 11.6 - 15.2 seconds   INR 1.09 0.00 - 1.49  APTT  Result Value Ref Range   aPTT 30 24 - 37 seconds  CBC  Result Value Ref Range   WBC 8.1 4.0 - 10.5 K/uL   RBC 3.96 3.87 - 5.11 MIL/uL   Hemoglobin  11.6 (L) 12.0 - 15.0 g/dL   HCT 36.4 36.0 - 46.0 %   MCV 91.9 78.0 - 100.0 fL   MCH 29.3 26.0 - 34.0 pg   MCHC 31.9 30.0 - 36.0 g/dL   RDW 14.4 11.5 - 15.5 %   Platelets 210 150 - 400 K/uL  Differential  Result Value Ref Range   Neutrophils Relative % 64 43 - 77 %   Neutro Abs 5.2 1.7 - 7.7 K/uL   Lymphocytes Relative 26 12 - 46 %   Lymphs Abs 2.1 0.7 - 4.0 K/uL   Monocytes Relative 8 3 - 12 %   Monocytes Absolute 0.6 0.1 - 1.0 K/uL   Eosinophils Relative 2 0 - 5 %   Eosinophils Absolute 0.1 0.0 - 0.7 K/uL   Basophils Relative 0 0 - 1 %   Basophils Absolute 0.0 0.0 - 0.1 K/uL  Comprehensive metabolic panel  Result Value Ref Range   Sodium 140 137 - 147 mEq/L   Potassium 5.1 3.7 - 5.3 mEq/L   Chloride 103 96 - 112 mEq/L   CO2 23 19 - 32 mEq/L   Glucose, Bld 91 70 - 99 mg/dL   BUN 17 6 - 23 mg/dL   Creatinine, Ser 1.62 (H) 0.50 - 1.10 mg/dL   Calcium 9.1 8.4 - 10.5 mg/dL   Total Protein 7.6 6.0 - 8.3 g/dL   Albumin 4.0 3.5 - 5.2 g/dL   AST 16 0 - 37 U/L   ALT 10 0 - 35 U/L   Alkaline Phosphatase 111 39 - 117 U/L   Total Bilirubin 0.3 0.3 - 1.2 mg/dL   GFR calc non Af Amer 31 (L) >90 mL/min   GFR calc Af Amer 36 (L) >90 mL/min  Magnesium  Result Value Ref Range   Magnesium 2.1 1.5 - 2.5 mg/dL  Hemoglobin A1c  Result Value Ref Range   Hgb A1c MFr Bld 6.4 (H) <5.7 %   Mean Plasma Glucose 137 (H) <117 mg/dL  CBC  Result Value Ref Range   WBC 9.0 4.0 - 10.5 K/uL   RBC 4.05 3.87 - 5.11 MIL/uL   Hemoglobin 11.9 (L) 12.0 - 15.0 g/dL   HCT 37.8 36.0 - 46.0 %   MCV 93.3 78.0 - 100.0 fL   MCH 29.4 26.0 - 34.0 pg   MCHC 31.5 30.0 - 36.0 g/dL   RDW 14.4 11.5 - 15.5 %   Platelets 208 150 - 400 K/uL  Creatinine, serum  Result Value Ref Range   Creatinine, Ser 1.58 (H) 0.50 - 1.10 mg/dL   GFR calc non Af Amer 32 (L) >90 mL/min   GFR calc Af Amer 37 (L) >90 mL/min  Glucose, capillary  Result Value Ref Range   Glucose-Capillary 68 (L) 70 - 99 mg/dL   Comment 1 Notify RN     Comment 2 Documented in Chart   Fasting lipid panel  Result Value Ref Range   Cholesterol 118 0 - 200 mg/dL   Triglycerides 192 (H) <150 mg/dL   HDL 30 (L) >39 mg/dL   Total CHOL/HDL Ratio 3.9 RATIO   VLDL 38 0 - 40 mg/dL   LDL Cholesterol 50 0 - 99 mg/dL  CBC with Differential  Result Value Ref Range   WBC 7.2 4.0 - 10.5 K/uL   RBC 3.67 (L) 3.87 - 5.11 MIL/uL   Hemoglobin 10.8 (L) 12.0 - 15.0 g/dL   HCT 34.4 (L) 36.0 - 46.0 %   MCV 93.7 78.0 - 100.0 fL   MCH 29.4 26.0 - 34.0 pg   MCHC 31.4 30.0 - 36.0 g/dL   RDW 14.6 11.5 - 15.5 %   Platelets 165 150 - 400 K/uL   Neutrophils Relative % 56 43 - 77 %   Neutro Abs 4.1 1.7 - 7.7 K/uL   Lymphocytes Relative 32 12 - 46 %   Lymphs Abs 2.3 0.7 - 4.0 K/uL   Monocytes Relative 8 3 - 12 %   Monocytes Absolute 0.6 0.1 - 1.0 K/uL   Eosinophils Relative 3 0 - 5 %   Eosinophils Absolute 0.2 0.0 - 0.7 K/uL   Basophils Relative 1 0 - 1 %   Basophils Absolute 0.0 0.0 - 0.1 K/uL  Renal function panel  Result Value Ref Range   Sodium 142 137 - 147 mEq/L   Potassium 4.8 3.7 - 5.3 mEq/L   Chloride 106 96 - 112 mEq/L   CO2 23 19 - 32 mEq/L   Glucose, Bld 141 (H) 70 - 99 mg/dL   BUN 17 6 - 23 mg/dL   Creatinine, Ser 1.71 (H) 0.50 - 1.10 mg/dL   Calcium 8.4 8.4 - 10.5 mg/dL   Phosphorus 3.6 2.3 - 4.6 mg/dL   Albumin 3.5 3.5 - 5.2 g/dL   GFR calc non Af Amer 29 (L) >90 mL/min   GFR calc Af Amer 34 (L) >90 mL/min  Glucose, capillary  Result Value Ref Range   Glucose-Capillary 133 (H) 70 - 99 mg/dL   Comment 1 Notify RN    Comment 2 Documented in Chart   Glucose, capillary  Result Value Ref Range   Glucose-Capillary 159 (H) 70 - 99 mg/dL   Comment 1 Notify RN    Comment 2 Documented in Chart   Glucose, capillary  Result Value Ref Range   Glucose-Capillary 135 (H) 70 - 99 mg/dL   Comment 1 Notify RN    Comment 2 Documented in Chart   Creatinine, urine, random  Result Value Ref Range   Creatinine, Urine 64.30 mg/dL  Osmolality, urine   Result Value Ref Range   Osmolality, Ur 367 (L) 390 - 1090 mOsm/kg  Osmolality  Result Value Ref Range   Osmolality 298 275 - 300 mOsm/kg  Sodium, urine, random  Result Value Ref Range   Sodium, Ur 82 mEq/L  Urinalysis, Routine w reflex microscopic  Result Value Ref Range   Color, Urine YELLOW YELLOW   APPearance CLEAR CLEAR   Specific Gravity, Urine 1.015 1.005 - 1.030   pH 5.0 5.0 - 8.0   Glucose, UA 250 (A) NEGATIVE mg/dL   Hgb urine dipstick NEGATIVE NEGATIVE   Bilirubin Urine NEGATIVE NEGATIVE   Ketones, ur NEGATIVE NEGATIVE mg/dL   Protein, ur NEGATIVE NEGATIVE mg/dL   Urobilinogen, UA 0.2 0.0 - 1.0 mg/dL   Nitrite NEGATIVE NEGATIVE   Leukocytes, UA NEGATIVE NEGATIVE  TSH  Result Value Ref Range  TSH 4.350 0.350 - 4.500 uIU/mL  Glucose, capillary  Result Value Ref Range   Glucose-Capillary 152 (H) 70 - 99 mg/dL   Comment 1 Documented in Chart    Comment 2 Notify RN   Glucose, capillary  Result Value Ref Range   Glucose-Capillary 126 (H) 70 - 99 mg/dL   Comment 1 Documented in Chart    Comment 2 Notify RN   Basic metabolic panel  Result Value Ref Range   Sodium 140 137 - 147 mEq/L   Potassium 5.1 3.7 - 5.3 mEq/L   Chloride 103 96 - 112 mEq/L   CO2 22 19 - 32 mEq/L   Glucose, Bld 115 (H) 70 - 99 mg/dL   BUN 17 6 - 23 mg/dL   Creatinine, Ser 1.63 (H) 0.50 - 1.10 mg/dL   Calcium 9.1 8.4 - 10.5 mg/dL   GFR calc non Af Amer 31 (L) >90 mL/min   GFR calc Af Amer 36 (L) >90 mL/min  Glucose, capillary  Result Value Ref Range   Glucose-Capillary 123 (H) 70 - 99 mg/dL  Glucose, capillary  Result Value Ref Range   Glucose-Capillary 119 (H) 70 - 99 mg/dL   Comment 1 Documented in Chart    Comment 2 Notify RN   Glucose, capillary  Result Value Ref Range   Glucose-Capillary 153 (H) 70 - 99 mg/dL   Comment 1 Documented in Chart    Comment 2 Notify RN   I-stat troponin, ED (not at [facility])  Result Value Ref Range   Troponin i, poc 0.01 0.00 - 0.08 ng/mL   Comment 3             The neurologically relevant items on the patient's problem list were reviewed on today's visit.  Neurologic Examination  A problem focused neurological exam (12 or more points of the single system neurologic examination, vital signs counts as 1 point, cranial nerves count for 8 points) was performed.  Blood pressure 119/75, pulse 62, height 4' 11.5" (1.511 m), weight 206 lb 9.6 oz (93.713 kg).  General - Well nourished, well developed, in no apparent distress.  Ophthalmologic - Sharp disc margins OU.  Cardiovascular - Regular rate and rhythm with no murmur.  Mental Status -  Level of arousal and orientation to time, place, and person were intact. Language including expression, naming, repetition, comprehension, reading, and writing was assessed and found intact.  Cranial Nerves II - XII - II - Vision intact OU. III, IV, VI - Extraocular movements intact. V - Facial sensation intact bilaterally. VII - Facial movement intact bilaterally. VIII - Hearing & vestibular intact bilaterally. X - Palate elevates symmetrically. XI - Chin turning & shoulder shrug intact bilaterally. XII - Tongue protrusion intact.  Motor Strength - The patient's strength was normal in all extremities and pronator drift was absent.  Bulk was normal and fasciculations were absent.   Motor Tone - Muscle tone was assessed at the neck and appendages and was normal.  Reflexes - The patient's reflexes were normal in all extremities and she had no pathological reflexes.  Sensory - Light touch, temperature/pinprick, vibration and proprioception, and Romberg testing were assessed and were normal.    Coordination - The patient had normal movements in the hands and feet with no ataxia or dysmetria.  Tremor was absent.  Gait and Station - The patient's transfers, posture, gait, station, and turns were observed as normal.  Data reviewed: I personally reviewed the images and agree with the radiology  interpretations.  MRI and MRA 09/2013 -  No acute infarct  Negative MRA of the head.  EEG - normal study  2D echo - Left ventricle: E/e&'>14 suggestive of elevated LV filling pressures. The cavity size was normal. Systolic function was normal. The estimated ejection fraction was in the range of 55% to 60%. There was an increased relative contribution of atrial contraction to ventricular filling. Doppler parameters are consistent with abnormal left ventricular relaxation (grade 1 diastolic dysfunction). - Pericardium, extracardiac: A trivial, free-flowing pericardial effusion was identified along the left ventricular free wall. The fluid had no internal echoes.There was no evidence of hemodynamic Compromise.  CUS  Right: mild calcific plaque origin and proximal ICA and ECA. Left: mild mixed plaque origin ICA. Bilateral: 1-39% ICA stenosis. Vertebral artery flow is antegrade. ICA/CCA ratio: R-1.57 L-1.84.  Component     Latest Ref Rng 10/21/2013 10/22/2013  Cholesterol     0 - 200 mg/dL  118  Triglycerides     <150 mg/dL  192 (H)  HDL     >39 mg/dL  30 (L)  Total CHOL/HDL Ratio       3.9  VLDL     0 - 40 mg/dL  38  LDL (calc)     0 - 99 mg/dL  50  Hemoglobin A1C     <5.7 % 6.4 (H)   Mean Plasma Glucose     <117 mg/dL 137 (H)   TSH     0.350 - 4.500 uIU/mL  4.350    Assessment: As you may recall, she is a 69 y.o. Caucasian female with a diagnosis of focal seizure. She had episode of left leg jerking, with or without LOC for about 4 years and more frequent for the last 6 months. She was admitted in 09/2013 for LOC with left face twitch, left arm and leg jerking. Was given keppra IV 1000mg  load and then on keppra 500mg  bid. She does have stroke risk factors so she had also stroke work up which was negative. MRI and EEG negative. She can not have contrast MRI due to kidney function. She had no more spells since discharge. And she has about 6 months of seizure free now, able to  drive again with taking the medication. Will continue her keppra for seizure control and continue ASA and lipitor for stroke prevention.  Plan:  - continue keppra 500mg  bid for seizure control - continue ASA and lipitor for stroke prevention - able to resume driving at this time but need to continue take keppra as prescribed. - Follow up with your primary care physician for stroke risk factor modification. Recommend maintain blood pressure goal <130/80, diabetes with hemoglobin A1c goal below 6.5% and lipids with LDL cholesterol goal below 70 mg/dL.  - maintain seizure precautions - check BP and glucose at home - RTC in one year.  No orders of the defined types were placed in this encounter.   Patient Instructions  - continue ASA and lipitor for stroke prevention - continue keppra 500mg  bid for seizure control - you have been 6 months free of seizure, you may resume driving but take the medication as prescribed. - Follow up with your primary care physician for stroke risk factor modification. Recommend maintain blood pressure goal <130/80, diabetes with hemoglobin A1c goal below 6.5% and lipids with LDL cholesterol goal below 70 mg/dL.  - check BP and glucose at home - follow up in one year    Rosalin Hawking, MD PhD [facility] [id number]  [address] [place], [place] [address] [phone]

## 2014-05-15 ENCOUNTER — Other Ambulatory Visit: Payer: Self-pay | Admitting: Neurology

## 2014-06-16 DIAGNOSIS — E119 Type 2 diabetes mellitus without complications: Secondary | ICD-10-CM | POA: Diagnosis not present

## 2014-10-25 ENCOUNTER — Other Ambulatory Visit: Payer: Self-pay

## 2014-11-12 DIAGNOSIS — E785 Hyperlipidemia, unspecified: Secondary | ICD-10-CM | POA: Diagnosis not present

## 2014-11-12 DIAGNOSIS — E039 Hypothyroidism, unspecified: Secondary | ICD-10-CM | POA: Diagnosis not present

## 2014-11-12 DIAGNOSIS — I129 Hypertensive chronic kidney disease with stage 1 through stage 4 chronic kidney disease, or unspecified chronic kidney disease: Secondary | ICD-10-CM | POA: Diagnosis not present

## 2014-11-12 DIAGNOSIS — M5136 Other intervertebral disc degeneration, lumbar region: Secondary | ICD-10-CM | POA: Diagnosis not present

## 2014-11-12 DIAGNOSIS — E1140 Type 2 diabetes mellitus with diabetic neuropathy, unspecified: Secondary | ICD-10-CM | POA: Diagnosis not present

## 2014-11-12 DIAGNOSIS — F324 Major depressive disorder, single episode, in partial remission: Secondary | ICD-10-CM | POA: Diagnosis not present

## 2014-11-12 DIAGNOSIS — N184 Chronic kidney disease, stage 4 (severe): Secondary | ICD-10-CM | POA: Diagnosis not present

## 2014-11-12 DIAGNOSIS — G2581 Restless legs syndrome: Secondary | ICD-10-CM | POA: Diagnosis not present

## 2014-11-12 DIAGNOSIS — E1121 Type 2 diabetes mellitus with diabetic nephropathy: Secondary | ICD-10-CM | POA: Diagnosis not present

## 2014-12-09 DIAGNOSIS — Z1231 Encounter for screening mammogram for malignant neoplasm of breast: Secondary | ICD-10-CM | POA: Diagnosis not present

## 2014-12-09 DIAGNOSIS — N183 Chronic kidney disease, stage 3 (moderate): Secondary | ICD-10-CM | POA: Diagnosis not present

## 2014-12-09 DIAGNOSIS — I2510 Atherosclerotic heart disease of native coronary artery without angina pectoris: Secondary | ICD-10-CM | POA: Diagnosis not present

## 2014-12-09 DIAGNOSIS — I639 Cerebral infarction, unspecified: Secondary | ICD-10-CM | POA: Diagnosis not present

## 2014-12-09 DIAGNOSIS — G4733 Obstructive sleep apnea (adult) (pediatric): Secondary | ICD-10-CM | POA: Diagnosis not present

## 2014-12-13 DIAGNOSIS — G4733 Obstructive sleep apnea (adult) (pediatric): Secondary | ICD-10-CM | POA: Diagnosis not present

## 2014-12-20 DIAGNOSIS — G4733 Obstructive sleep apnea (adult) (pediatric): Secondary | ICD-10-CM | POA: Diagnosis not present

## 2014-12-30 DIAGNOSIS — E039 Hypothyroidism, unspecified: Secondary | ICD-10-CM | POA: Diagnosis not present

## 2015-02-16 DIAGNOSIS — E1121 Type 2 diabetes mellitus with diabetic nephropathy: Secondary | ICD-10-CM | POA: Diagnosis not present

## 2015-02-16 DIAGNOSIS — E785 Hyperlipidemia, unspecified: Secondary | ICD-10-CM | POA: Diagnosis not present

## 2015-02-16 DIAGNOSIS — I129 Hypertensive chronic kidney disease with stage 1 through stage 4 chronic kidney disease, or unspecified chronic kidney disease: Secondary | ICD-10-CM | POA: Diagnosis not present

## 2015-02-16 DIAGNOSIS — Z23 Encounter for immunization: Secondary | ICD-10-CM | POA: Diagnosis not present

## 2015-02-16 DIAGNOSIS — N184 Chronic kidney disease, stage 4 (severe): Secondary | ICD-10-CM | POA: Diagnosis not present

## 2015-02-16 DIAGNOSIS — F324 Major depressive disorder, single episode, in partial remission: Secondary | ICD-10-CM | POA: Diagnosis not present

## 2015-02-16 DIAGNOSIS — E1140 Type 2 diabetes mellitus with diabetic neuropathy, unspecified: Secondary | ICD-10-CM | POA: Diagnosis not present

## 2015-03-15 DIAGNOSIS — G4733 Obstructive sleep apnea (adult) (pediatric): Secondary | ICD-10-CM | POA: Diagnosis not present

## 2015-04-18 ENCOUNTER — Other Ambulatory Visit: Payer: Self-pay | Admitting: Neurology

## 2015-05-12 ENCOUNTER — Encounter: Payer: Self-pay | Admitting: Nurse Practitioner

## 2015-05-12 ENCOUNTER — Ambulatory Visit (INDEPENDENT_AMBULATORY_CARE_PROVIDER_SITE_OTHER): Payer: Medicare Other | Admitting: Nurse Practitioner

## 2015-05-12 VITALS — BP 161/68 | HR 71 | Ht 59.5 in | Wt 218.6 lb

## 2015-05-12 DIAGNOSIS — I10 Essential (primary) hypertension: Secondary | ICD-10-CM | POA: Diagnosis not present

## 2015-05-12 DIAGNOSIS — R569 Unspecified convulsions: Secondary | ICD-10-CM

## 2015-05-12 DIAGNOSIS — E785 Hyperlipidemia, unspecified: Secondary | ICD-10-CM | POA: Diagnosis not present

## 2015-05-12 MED ORDER — LEVETIRACETAM 500 MG PO TABS: 500.0000 mg | ORAL_TABLET | Freq: Two times a day (BID) | ORAL | Status: DC

## 2015-05-12 NOTE — Progress Notes (Signed)
I reviewed above note and agree with the assessment and plan.  Rosalin Hawking, MD PhD Stroke Neurology 05/12/2015 9:45 PM

## 2015-05-12 NOTE — Patient Instructions (Addendum)
continue keppra 500mg  bid for seizure control will refill - continue ASA and lipitor for stroke prevention - Ok to drive continue take keppra as prescribed. - Follow up with your primary care physician for stroke risk factor modification. Recommend maintain blood pressure goal <130/80, diabetes with hemoglobin A1c goal below 6.5% and lipids with LDL cholesterol goal below 70 mg/dL.  - maintain seizure precautions - continue to check BP and glucose at home - RTC in one year.

## 2015-05-12 NOTE — Progress Notes (Signed)
[facility]  PATIENT: [name] DOB: 1946/04/04   REASON FOR VISIT follow-up for seizure disorder, history of stroke ,hypertension, hyperlipidemia, and diabetes HISTORY FROM: Patient    HISTORY OF PRESENT ILLNESS: HISTORY Dr. Loma Boston yo white F with PMH of CAD, HTN, DM, HLD, OSA, CKD stage IV and several episodes of LOC presented as hospital discharge follow up.  She stated that about 4 years ago, she had episode of pressure at back of head and then fell and became unresponsive. She was sent to [facility] and was told to have mini-stroke. Discharged with no medication. For the last 6 month, she had total about 20 similar episodes, 12 of them she had LOC and 8 out of the 20 she did not lose consciousness. With the ones she was awake, she stated that she felt her left leg started to jerk, shake, she will have to sit down, otherwise will fall, lasting 5-66min, no arm or face involvement, but has some slurry speech. She never seek for medical attention. On 10/21/13, when she came back from outside, at her front doorstep, she felt the episodes came again, she had to sit down, and then she woke up in the hospital. She can not remember what happened to her. As per chart, she had left face twitching, left arm and leg shaking. MRI and MRA normal, EEG normal and other work up unrevealing. She was treated with keppra and no more spells. She was discharged with ASA and lipitor as well as keppra.  11/25/13 follow up - the patient has been doing well. No seizure spells. She stated that she did have two falls but they are mechanical fall without LOC and not similar feeling of her episodes. She is on medications for HTN and DM, she stated that her sugar and BP level at home were good. She can not have contrast MRI done due to her kidney function.  03/01/14 follow up - the patient has been doing well. No recurrent seizure episodes. She is not driving. No neurological complains. Followed up with  cardiology in 11/2013 and was doing well from cardiology standpoint. Next visit with PCP in Jan 2016. BP in clinic today 131/60.  Interval History 1/12/16JXDuring the interval time, pt was doing well. No recurrent seizure episodes. Requesting to back to drive. No complains. BP 119/75. UPDATE 05/12/15 [name], 70 year old female returns for follow-up. No seizures in the last year remains on Keppra 500 twice daily without side effects. Blood pressure elevated in the office today at 161/68 however she claims she takes her blood pressure has been in normal range. Her CBGs have ranged from 160 to 200. She says she has not been eating  correctly. She has not had further stroke or TIA symptoms. She remains on aspirin and Lipitor and her labs are followed by her primary care provider Dr. Dema Severin. She returns for reevaluation     REVIEW OF SYSTEMS: Full 14 system review of systems performed and notable only for those listed, all others are neg:  Constitutional: neg  Cardiovascular: neg Ear/Nose/Throat: neg  Skin: neg Eyes: neg Respiratory: neg Gastroitestinal: neg  Hematology/Lymphatic: neg  Endocrine: neg Musculoskeletal:neg Allergy/Immunology: neg Neurological: neg Psychiatric: neg Sleep : neg   ALLERGIES: Allergies  Allergen Reactions  . Codeine Itching  . Meperidine Hcl Itching  . Morphine Itching  . Nylon Other (See Comments)    Stitches cause infection  . Penicillins Hives, Itching and Rash    HOME MEDICATIONS: Outpatient Prescriptions Prior to Visit  Medication Sig Dispense Refill  . ACCU-CHEK SMARTVIEW test strip     . ALPRAZolam (XANAX) 0.5 MG tablet Take 0.5 mg by mouth at bedtime as needed for anxiety.    Marland Kitchen aspirin 81 MG tablet Take 81 mg by mouth daily.      Marland Kitchen atorvastatin (LIPITOR) 20 MG tablet Take 20 mg by mouth daily at 6 PM.     . buPROPion (WELLBUTRIN XL) 150 MG 24 hr tablet Take 150 mg by mouth daily.   2  . Cholecalciferol (VITAMIN D-3) 5000 UNITS TABS Take by  mouth.    . desvenlafaxine (PRISTIQ) 50 MG 24 hr tablet Take 50 mg by mouth daily.      Marland Kitchen docusate sodium (COLACE) 100 MG capsule Take 100 mg by mouth 3 (three) times daily.    . fluticasone (FLONASE) 50 MCG/ACT nasal spray Place 2 sprays into both nostrils daily.    . furosemide (LASIX) 40 MG tablet Take 40 mg by mouth daily.      Marland Kitchen gabapentin (NEURONTIN) 600 MG tablet Take 600 mg by mouth at bedtime.   2  . glimepiride (AMARYL) 4 MG tablet Take 4 mg by mouth daily.     . iron polysaccharides (NIFEREX) 150 MG capsule Take 150 mg by mouth daily.    Marland Kitchen levETIRAcetam (KEPPRA) 500 MG tablet TAKE 1 TABLET BY MOUTH TWICE A DAY 60 tablet 0  . Levothyroxine Sodium 112 MCG CAPS Take by mouth daily.      Marland Kitchen losartan (COZAAR) 50 MG tablet Take 50 mg by mouth daily.     . meclizine (ANTIVERT) 25 MG tablet Take 25 mg by mouth 3 (three) times daily as needed for dizziness. 1/2 to one tid    . metoprolol (LOPRESSOR) 50 MG tablet Take 50 mg by mouth 2 (two) times daily.     Marland Kitchen omeprazole (PRILOSEC) 40 MG capsule Take 40 mg by mouth daily.     . ONGLYZA 2.5 MG TABS tablet Take 2.5 mg by mouth daily.    Marland Kitchen rOPINIRole (REQUIP) 0.25 MG tablet Take 0.25 mg by mouth at bedtime.      . tizanidine (ZANAFLEX) 2 MG capsule Take 2 mg by mouth at bedtime.    . traMADol (ULTRAM) 50 MG tablet Take 50 mg by mouth every 6 (six) hours as needed.    . traZODone (DESYREL) 50 MG tablet Take 50 mg by mouth 2 (two) times daily. Patient takes 1 pill and a half.     No facility-administered medications prior to visit.    PAST MEDICAL HISTORY: Past Medical History  Diagnosis Date  . CAD (coronary artery disease)      (Nonobstructive coronary artery disease 40% LAD stenosis)  2011  . HTN (hypertension)   . OSA (obstructive sleep apnea)   . DM (diabetes mellitus) ([place])   . Hypothyroidism   . Allergic rhinitis   . Back pain   . DJD (degenerative joint disease)   . Stroke ([place])   . CHF (congestive heart failure) ([place])   .  Myocardial infarction ([place]) 2013  . Depression   . Anxiety   . Chronic kidney disease     STAGE 4 KIDNEY DISEASE  . GERD (gastroesophageal reflux disease)   . H/O hiatal hernia   . Headache(784.0)     PAST SURGICAL HISTORY: Past Surgical History  Procedure Laterality Date  . Cervical disc surgery    . Carpal tunnel release    . Uvulopalatopharyngoplasty    . Salpingoophorectomy    .  Abdominal hysterectomy    . Replacement total knee      FAMILY HISTORY: Family History  Problem Relation Age of Onset  . Breast cancer Mother   . Breast cancer Sister     SOCIAL HISTORY: Social History   Social History  . Marital Status: Married    Spouse Name: N/A  . Number of Children: 3  . Years of Education: 12th   Occupational History  . retired    Social History Main Topics  . Smoking status: Never Smoker   . Smokeless tobacco: Never Used  . Alcohol Use: No  . Drug Use: No  . Sexual Activity: No   Other Topics Concern  . Not on file   Social History Narrative   Patient is married with 3 children.   Patient is right handed.   Patient has 12 th grade education.   Patient drinks 1 soda occ.     PHYSICAL EXAM  Filed Vitals:   05/12/15 0837  BP: 161/68  Pulse: 71  Height: 4' 11.5" (1.511 m)  Weight: 218 lb 9.6 oz (99.156 kg)   Body mass index is 43.43 kg/(m^2). General - Well nourished, well developed, obese female in  no apparent distress. Ophthalmologic - Sharp disc margins OU. Cardiovascular - Regular rate and rhythm with no murmur.  Mental Status -  Level of arousal and orientation to time, place, and person were intact. Language including expression, naming, repetition, comprehension, reading, and writing was assessed and found intact.  Cranial Nerves II - XII - II - Vision intact OU. III, IV, VI - Extraocular movements intact. V - Facial sensation intact bilaterally. VII - Facial movement intact bilaterally. VIII - Hearing & vestibular intact  bilaterally. X - Palate elevates symmetrically. XI - Chin turning & shoulder shrug intact bilaterally. XII - Tongue protrusion intact.  Motor Strength - The patient's strength was normal in all extremities and pronator drift was absent. Bulk was normal and fasciculations were absent.  Motor Tone - Muscle tone was assessed at the neck and appendages and was normal. Reflexes - The patient's reflexes were normal in all extremities and she had no pathological reflexes. Sensory - Light touch, temperature/pinprick, vibration and proprioception, and Romberg testing were assessed and were normal.  Coordination - The patient had normal movements in the hands and feet with no ataxia or dysmetria. Tremor was absent. Gait and Station - The patient's transfers, posture, gait, station, and turns were observed as normal. Tandem gait is steady    DIAGNOSTIC DATA (LABS, IMAGING, TESTING) - I reviewed patient records, labs, notes, testing and imaging myself where available.  Lab Results  Component Value Date   WBC 7.2 10/22/2013   HGB 10.8* 10/22/2013   HCT 34.4* 10/22/2013   MCV 93.7 10/22/2013   PLT 165 10/22/2013      Component Value Date/Time   NA 140 10/23/2013 0600   K 5.1 10/23/2013 0600   CL 103 10/23/2013 0600   CO2 22 10/23/2013 0600   GLUCOSE 115* 10/23/2013 0600   BUN 17 10/23/2013 0600   CREATININE 1.63* 10/23/2013 0600   CALCIUM 9.1 10/23/2013 0600   PROT 7.6 10/21/2013 1049   ALBUMIN 3.5 10/22/2013 0440   AST 16 10/21/2013 1049   ALT 10 10/21/2013 1049   ALKPHOS 111 10/21/2013 1049   BILITOT 0.3 10/21/2013 1049   GFRNONAA 31* 10/23/2013 0600   GFRAA 36* 10/23/2013 0600   Lab Results  Component Value Date   CHOL 118 10/22/2013   HDL  30* 10/22/2013   LDLCALC 50 10/22/2013   TRIG 192* 10/22/2013   CHOLHDL 3.9 10/22/2013   Lab Results  Component Value Date   HGBA1C 6.4* 10/21/2013    Lab Results  Component Value Date   TSH 4.350 10/22/2013      ASSESSMENT  AND PLAN 70 y.o. Caucasian female with a diagnosis of focal seizure. She had episode of left leg jerking, with or without LOC for about 4 years  She was admitted in 09/2013 for LOC with left face twitch, left arm and leg jerking. Was given keppra IV 1000mg  load and then on keppra 500mg  bid. She does have stroke risk factors so she had also stroke work up which was negative. MRI and EEG negative. She can not have contrast MRI due to kidney function. She had no more spells since discharge. And she has about 1.5 years  seizure free now, able to drive again with taking the medication. Will continue her keppra for seizure control and continue ASA and lipitor for stroke prevention.  PLAN:continue keppra 500mg  bid for seizure control will refill - continue ASA and lipitor for stroke prevention - Ok to drive continue take keppra as prescribed. - Follow up with your primary care physician for stroke risk factor modification. Recommend maintain blood pressure goal <130/80, diabetes with hemoglobin A1c goal below 6.5% and lipids with LDL cholesterol goal below 70 mg/dL.  - maintain seizure precautions - continue to check BP and glucose at home - RTC in one year.  Dennie Bible, [facility], [facility], APRN  [facility] [address], [place], [place] [address] [phone]

## 2016-05-10 ENCOUNTER — Ambulatory Visit: Payer: Medicare Other | Admitting: Nurse Practitioner

## 2016-05-10 ENCOUNTER — Ambulatory Visit (INDEPENDENT_AMBULATORY_CARE_PROVIDER_SITE_OTHER): Payer: Medicare Other | Admitting: Nurse Practitioner

## 2016-05-10 ENCOUNTER — Encounter: Payer: Self-pay | Admitting: Nurse Practitioner

## 2016-05-10 VITALS — BP 138/69 | HR 71 | Ht 59.5 in | Wt 213.8 lb

## 2016-05-10 DIAGNOSIS — E785 Hyperlipidemia, unspecified: Secondary | ICD-10-CM | POA: Diagnosis not present

## 2016-05-10 DIAGNOSIS — R569 Unspecified convulsions: Secondary | ICD-10-CM

## 2016-05-10 DIAGNOSIS — I10 Essential (primary) hypertension: Secondary | ICD-10-CM

## 2016-05-10 MED ORDER — LEVETIRACETAM 500 MG PO TABS: 500.0000 mg | ORAL_TABLET | Freq: Two times a day (BID) | ORAL | 3 refills | Status: DC

## 2016-05-10 NOTE — Progress Notes (Signed)
[facility]  PATIENT: [name] DOB: 1946-01-03   REASON FOR VISIT follow-up for seizure disorder, stroke risk factors of hypertension, hyperlipidemia, and diabetes, stroke workup in the past negative HISTORY FROM: Patient    HISTORY OF PRESENT ILLNESS: HISTORY Dr. Loma Obrien yo white F with PMH of CAD, HTN, DM, HLD, OSA, CKD stage IV and several episodes of LOC presented as hospital discharge follow up.  She stated that about 4 years ago, she had episode of pressure at back of head and then fell and became unresponsive. She was sent to [facility] and was told to have mini-stroke. Discharged with no medication. For the last 6 month, she had total about 20 similar episodes, 12 of them she had LOC and 8 out of the 20 she did not lose consciousness. With the ones she was awake, she stated that she felt her left leg started to jerk, shake, she will have to sit down, otherwise will fall, lasting 5-64min, no arm or face involvement, but has some slurry speech. She never seek for medical attention. On 10/21/13, when she came back from outside, at her front doorstep, she felt the episodes came again, she had to sit down, and then she woke up in the hospital. She can not remember what happened to her. As per chart, she had left face twitching, left arm and leg shaking. MRI and MRA normal, EEG normal and other work up unrevealing. She was treated with keppra and no more spells. She was discharged with ASA and lipitor as well as keppra.  11/25/13 follow up - the patient has been doing well. No seizure spells. She stated that she did have two falls but they are mechanical fall without LOC and not similar feeling of her episodes. She is on medications for HTN and DM, she stated that her sugar and BP level at home were good. She can not have contrast MRI done due to her kidney function.  03/01/14 follow up - the patient has been doing well. No recurrent seizure episodes. She is not driving. No  neurological complains. Followed up with cardiology in 11/2013 and was doing well from cardiology standpoint. Next visit with PCP in Jan 2016. BP in clinic today 131/60.  Interval History 1/12/16JXDuring the interval time, pt was doing well. No recurrent seizure episodes. Requesting to back to drive. No complains. BP 119/75. UPDATE 1/12/17CM [name], 71 year old female returns for follow-up. No seizures in the last year remains on Keppra 500 twice daily without side effects. Blood pressure elevated in the office today at 161/68 however she claims she takes her blood pressure has been in normal range. Her CBGs have ranged from 160 to 200. She says she has not been eating  correctly. She has not had further stroke or TIA symptoms. She remains on aspirin and Lipitor and her labs are followed by her primary care provider [name]. She returns for reevaluation UPDATE 01/11/2018CM [name], 71 year old female returns for yearly follow-up. She has a history of seizure disorder and is currently on Keppra 500 twice daily without side effects no seizures in the last 2 years. In addition she has a history of stroke and is currently on aspirin with minimal bruising and no bleeding. No further stroke or TIA symptoms since last seen. Blood pressure in the office today 138/69. She claims recent labs were done at [name] office and were stable and do not have access to those. She returns for reevaluation    REVIEW OF  SYSTEMS: Full 14 system review of systems performed and notable only for those listed, all others are neg:  Constitutional: neg  Cardiovascular: neg Ear/Nose/Throat: neg  Skin: neg Eyes: neg Respiratory: neg Gastroitestinal: neg  Hematology/Lymphatic: neg  Endocrine: neg Musculoskeletal:neg Allergy/Immunology: neg Neurological: neg Psychiatric: neg Sleep : neg   ALLERGIES: Allergies  Allergen Reactions  . Codeine Itching  . Meperidine Hcl Itching  . Morphine Itching  . Nylon  Other (See Comments)    Stitches cause infection  . Penicillins Hives, Itching and Rash    HOME MEDICATIONS: Outpatient Medications Prior to Visit  Medication Sig Dispense Refill  . ACCU-CHEK SMARTVIEW test strip     . ALPRAZolam (XANAX) 0.5 MG tablet Take 0.5 mg by mouth at bedtime as needed for anxiety.    Marland Kitchen aspirin 81 MG tablet Take 81 mg by mouth daily.      Marland Kitchen atorvastatin (LIPITOR) 20 MG tablet Take 20 mg by mouth daily at 6 PM.     . buPROPion (WELLBUTRIN XL) 150 MG 24 hr tablet Take 150 mg by mouth daily.   2  . Cholecalciferol (VITAMIN D-3) 5000 UNITS TABS Take by mouth.    . desvenlafaxine (PRISTIQ) 50 MG 24 hr tablet Take 50 mg by mouth daily.      Marland Kitchen docusate sodium (COLACE) 100 MG capsule Take 100 mg by mouth 3 (three) times daily.    . fluticasone (FLONASE) 50 MCG/ACT nasal spray Place 2 sprays into both nostrils daily.    . furosemide (LASIX) 40 MG tablet Take 40 mg by mouth daily.      Marland Kitchen gabapentin (NEURONTIN) 600 MG tablet Take 600 mg by mouth at bedtime.   2  . glimepiride (AMARYL) 4 MG tablet Take 4 mg by mouth daily.     . iron polysaccharides (NIFEREX) 150 MG capsule Take 150 mg by mouth daily.    Marland Kitchen levETIRAcetam (KEPPRA) 500 MG tablet Take 1 tablet (500 mg total) by mouth 2 (two) times daily. 60 tablet 11  . Levothyroxine Sodium 112 MCG CAPS Take by mouth daily.      Marland Kitchen losartan (COZAAR) 50 MG tablet Take 50 mg by mouth daily.     . meclizine (ANTIVERT) 25 MG tablet Take 25 mg by mouth 3 (three) times daily as needed for dizziness. 1/2 to one tid    . metoprolol (LOPRESSOR) 50 MG tablet Take 50 mg by mouth 2 (two) times daily.     Marland Kitchen omeprazole (PRILOSEC) 40 MG capsule Take 40 mg by mouth daily.     . ONGLYZA 2.5 MG TABS tablet Take 2.5 mg by mouth daily.    Marland Kitchen rOPINIRole (REQUIP) 0.25 MG tablet Take 0.25 mg by mouth at bedtime.      . tizanidine (ZANAFLEX) 2 MG capsule Take 2 mg by mouth at bedtime.    . traMADol (ULTRAM) 50 MG tablet Take 50 mg by mouth every 6 (six)  hours as needed.    . traZODone (DESYREL) 50 MG tablet Take 50 mg by mouth 2 (two) times daily. Patient takes 1 pill and a half.     No facility-administered medications prior to visit.     PAST MEDICAL HISTORY: Past Medical History:  Diagnosis Date  . Allergic rhinitis   . Anxiety   . Back pain   . CAD (coronary artery disease)     (Nonobstructive coronary artery disease 40% LAD stenosis)  2011  . CHF (congestive heart failure) ([place])   . Chronic kidney disease  STAGE 4 KIDNEY DISEASE  . Depression   . DJD (degenerative joint disease)   . DM (diabetes mellitus) ([place])   . GERD (gastroesophageal reflux disease)   . H/O hiatal hernia   . Headache(784.0)   . HTN (hypertension)   . Hypothyroidism   . Myocardial infarction 2013  . OSA (obstructive sleep apnea)   . Stroke [facility])     PAST SURGICAL HISTORY: Past Surgical History:  Procedure Laterality Date  . ABDOMINAL HYSTERECTOMY    . CARPAL TUNNEL RELEASE    . CERVICAL DISC SURGERY    . REPLACEMENT TOTAL KNEE    . SALPINGOOPHORECTOMY    . UVULOPALATOPHARYNGOPLASTY      FAMILY HISTORY: Family History  Problem Relation Age of Onset  . Breast cancer Mother   . Breast cancer Sister     SOCIAL HISTORY: Social History   Social History  . Marital status: Married    Spouse name: N/A  . Number of children: 3  . Years of education: 12th   Occupational History  . retired    Social History Main Topics  . Smoking status: Never Smoker  . Smokeless tobacco: Never Used  . Alcohol use No  . Drug use: No  . Sexual activity: No   Other Topics Concern  . Not on file   Social History Narrative   Patient is married with 3 children.   Patient is right handed.   Patient has 12 th grade education.   Patient drinks 1 soda occ.     PHYSICAL EXAM  Vitals:   05/10/16 0757  BP: 138/69  Pulse: 71  Weight: 213 lb 12.8 oz (97 kg)  Height: 4' 11.5" (1.511 m)   Body mass index is 42.46 kg/m. General - Well nourished,  well developed, obese female in  no apparent distress. Cardiovascular - Regular rate and rhythm with no murmur.  Mental Status -  Level of arousal and orientation to time, place, and person were intact. Language including expression, naming, repetition, comprehension, reading, and writing was assessed and found intact.  Cranial Nerves II - XII - II - Vision intact OU. III, IV, VI - Extraocular movements intact. V - Facial sensation intact bilaterally. VII - Facial movement intact bilaterally. VIII - Hearing & vestibular intact bilaterally. X - Palate elevates symmetrically. XI - Chin turning & shoulder shrug intact bilaterally. XII - Tongue protrusion intact.  Motor Strength Tone- The patient's strength and tone was normal in all extremities and pronator drift was absent.  Reflexes - The patient's reflexes were normal in all extremities and she had no pathological reflexes. Sensory - Light touch, temperature/pinprick, vibration normal in the upper and lower extremities  Coordination - The patient had normal movements in the hands and feet with no ataxia or dysmetria. Tremor was absent. Gait and Station - The patient's transfers, posture, gait, station, and turns were observed as normal. Tandem gait is steady. No assistive device    DIAGNOSTIC DATA (LABS, IMAGING, TESTING)   ASSESSMENT AND PLAN 71 y.o. Caucasian female with a diagnosis of focal seizure. She had episode of left leg jerking, with or without LOC for about 4 years  She was admitted in 09/2013 for LOC with left face twitch, left arm and leg jerking. Was given keppra IV 1000mg  load and then on keppra 500mg  bid. She does have stroke risk factors so she had also stroke work up which was negative. MRI and EEG negative. She can not have contrast MRI due to kidney function.  She had no more spells since discharge in 2015.  Will continue her keppra for seizure control and continue ASA and lipitor for stroke prevention.The patient is  a current patient of Dr. Erlinda Hong  who is out of the office today . This note is sent to the work in doctor.     PLAN:Continue keppra 500mg  bid for seizure control will refill Continue ASA and lipitor for stroke prevention, labs followed by PCP Maintain blood pressure goal <130/80, today's reading 138/69  diabetes with hemoglobin A1c goal below 6.5% and lipids with LDL cholesterol goal below 70 mg/dL.  Continue aspirin for secondary stroke prevention  RTC in one year., next with Mertha Finders, [facility], [facility], APRN  [facility] [address] [place], [place] [address] [phone]

## 2016-05-10 NOTE — Patient Instructions (Signed)
Continue keppra 500mg  bid for seizure control will refill Continue ASA and lipitor for stroke prevention, labs followed by PCP Maintain blood pressure goal <130/80, today's reading 138/69  diabetes with hemoglobin A1c goal below 6.5% and lipids with LDL cholesterol goal below 70 mg/dL.  Continue aspirin for secondary stroke prevention  RTC in one year.

## 2016-05-11 NOTE — Progress Notes (Signed)
I have reviewed and agreed above plan. 

## 2016-05-24 ENCOUNTER — Other Ambulatory Visit: Payer: Self-pay | Admitting: Nurse Practitioner

## 2016-09-06 ENCOUNTER — Other Ambulatory Visit: Payer: Self-pay | Admitting: Family Medicine

## 2016-09-06 ENCOUNTER — Ambulatory Visit
Admission: RE | Admit: 2016-09-06 | Discharge: 2016-09-06 | Disposition: A | Discharge status: Home / Self Care | Payer: Medicare PPO | Source: Ambulatory Visit | Attending: Family Medicine | Admitting: Family Medicine

## 2016-09-06 DIAGNOSIS — M25551 Pain in right hip: Secondary | ICD-10-CM

## 2016-09-06 DIAGNOSIS — S60222A Contusion of left hand, initial encounter: Secondary | ICD-10-CM

## 2016-09-10 ENCOUNTER — Other Ambulatory Visit: Payer: Self-pay | Admitting: Family Medicine

## 2016-09-10 DIAGNOSIS — R9389 Abnormal findings on diagnostic imaging of other specified body structures: Secondary | ICD-10-CM

## 2016-09-10 DIAGNOSIS — M25551 Pain in right hip: Secondary | ICD-10-CM

## 2016-09-22 ENCOUNTER — Ambulatory Visit
Admission: RE | Admit: 2016-09-22 | Discharge: 2016-09-22 | Disposition: A | Discharge status: Home / Self Care | Payer: Medicare Other | Source: Ambulatory Visit | Attending: Family Medicine | Admitting: Family Medicine

## 2016-09-22 DIAGNOSIS — R9389 Abnormal findings on diagnostic imaging of other specified body structures: Secondary | ICD-10-CM

## 2016-09-22 DIAGNOSIS — M25551 Pain in right hip: Secondary | ICD-10-CM

## 2017-02-08 ENCOUNTER — Emergency Department (HOSPITAL_COMMUNITY): Payer: Medicare Other

## 2017-02-08 ENCOUNTER — Encounter (HOSPITAL_COMMUNITY): Payer: Self-pay | Admitting: Emergency Medicine

## 2017-02-08 ENCOUNTER — Emergency Department (HOSPITAL_COMMUNITY)
Admission: EM | Admit: 2017-02-08 | Discharge: 2017-02-08 | Disposition: A | Discharge status: Home / Self Care | Payer: Medicare Other | Attending: Emergency Medicine | Admitting: Emergency Medicine

## 2017-02-08 DIAGNOSIS — Y999 Unspecified external cause status: Secondary | ICD-10-CM | POA: Diagnosis not present

## 2017-02-08 DIAGNOSIS — S8011XA Contusion of right lower leg, initial encounter: Secondary | ICD-10-CM | POA: Insufficient documentation

## 2017-02-08 DIAGNOSIS — N184 Chronic kidney disease, stage 4 (severe): Secondary | ICD-10-CM | POA: Diagnosis not present

## 2017-02-08 DIAGNOSIS — Z96659 Presence of unspecified artificial knee joint: Secondary | ICD-10-CM | POA: Insufficient documentation

## 2017-02-08 DIAGNOSIS — I2510 Atherosclerotic heart disease of native coronary artery without angina pectoris: Secondary | ICD-10-CM | POA: Diagnosis not present

## 2017-02-08 DIAGNOSIS — S8991XA Unspecified injury of right lower leg, initial encounter: Secondary | ICD-10-CM | POA: Diagnosis present

## 2017-02-08 DIAGNOSIS — W010XXA Fall on same level from slipping, tripping and stumbling without subsequent striking against object, initial encounter: Secondary | ICD-10-CM | POA: Insufficient documentation

## 2017-02-08 DIAGNOSIS — Y92009 Unspecified place in unspecified non-institutional (private) residence as the place of occurrence of the external cause: Secondary | ICD-10-CM | POA: Diagnosis not present

## 2017-02-08 DIAGNOSIS — Z79899 Other long term (current) drug therapy: Secondary | ICD-10-CM | POA: Insufficient documentation

## 2017-02-08 DIAGNOSIS — Y9301 Activity, walking, marching and hiking: Secondary | ICD-10-CM | POA: Diagnosis not present

## 2017-02-08 DIAGNOSIS — I130 Hypertensive heart and chronic kidney disease with heart failure and stage 1 through stage 4 chronic kidney disease, or unspecified chronic kidney disease: Secondary | ICD-10-CM | POA: Diagnosis not present

## 2017-02-08 DIAGNOSIS — E039 Hypothyroidism, unspecified: Secondary | ICD-10-CM | POA: Insufficient documentation

## 2017-02-08 DIAGNOSIS — Z7982 Long term (current) use of aspirin: Secondary | ICD-10-CM | POA: Insufficient documentation

## 2017-02-08 DIAGNOSIS — I509 Heart failure, unspecified: Secondary | ICD-10-CM | POA: Insufficient documentation

## 2017-02-08 DIAGNOSIS — E1122 Type 2 diabetes mellitus with diabetic chronic kidney disease: Secondary | ICD-10-CM | POA: Diagnosis not present

## 2017-02-08 DIAGNOSIS — W19XXXA Unspecified fall, initial encounter: Secondary | ICD-10-CM

## 2017-02-08 LAB — TYPE AND SCREEN
ABO/RH(D): A NEG
ANTIBODY SCREEN: NEGATIVE

## 2017-02-08 LAB — CBC WITH DIFFERENTIAL/PLATELET
BASOS ABS: 0 10*3/uL (ref 0.0–0.1)
BASOS PCT: 0 %
EOS PCT: 3 %
Eosinophils Absolute: 0.2 10*3/uL (ref 0.0–0.7)
HEMATOCRIT: 41.6 % (ref 36.0–46.0)
Hemoglobin: 13.1 g/dL (ref 12.0–15.0)
LYMPHS PCT: 25 %
Lymphs Abs: 2.1 10*3/uL (ref 0.7–4.0)
MCH: 29.2 pg (ref 26.0–34.0)
MCHC: 31.5 g/dL (ref 30.0–36.0)
MCV: 92.7 fL (ref 78.0–100.0)
Monocytes Absolute: 0.4 10*3/uL (ref 0.1–1.0)
Monocytes Relative: 4 %
NEUTROS ABS: 5.6 10*3/uL (ref 1.7–7.7)
Neutrophils Relative %: 68 %
PLATELETS: 160 10*3/uL (ref 150–400)
RBC: 4.49 MIL/uL (ref 3.87–5.11)
RDW: 14.6 % (ref 11.5–15.5)
WBC: 8.3 10*3/uL (ref 4.0–10.5)

## 2017-02-08 LAB — BASIC METABOLIC PANEL
Anion gap: 9 (ref 5–15)
BUN: 11 mg/dL (ref 6–20)
CHLORIDE: 105 mmol/L (ref 101–111)
CO2: 29 mmol/L (ref 22–32)
CREATININE: 1.07 mg/dL — AB (ref 0.44–1.00)
Calcium: 9.1 mg/dL (ref 8.9–10.3)
GFR calc Af Amer: 58 mL/min — ABNORMAL LOW (ref >60)
GFR, EST NON AFRICAN AMERICAN: 50 mL/min — AB (ref >60)
GLUCOSE: 117 mg/dL — AB (ref 65–99)
Potassium: 3.8 mmol/L (ref 3.5–5.1)
Sodium: 143 mmol/L (ref 135–145)

## 2017-02-08 LAB — PROTIME-INR
INR: 1
Prothrombin Time: 13.1 seconds (ref 11.4–15.2)

## 2017-02-08 MED ORDER — ONDANSETRON HCL 4 MG/2ML IJ SOLN
4.0000 mg | Freq: Once | INTRAMUSCULAR | Status: AC
  Administered 2017-02-08: 4 mg via INTRAVENOUS
  Filled 2017-02-08: qty 2

## 2017-02-08 MED ORDER — SODIUM CHLORIDE 0.9 % IV SOLN
INTRAVENOUS | Status: DC
  Administered 2017-02-08: 11:00:00 via INTRAVENOUS

## 2017-02-08 MED ORDER — FENTANYL CITRATE (PF) 100 MCG/2ML IJ SOLN
50.0000 ug | INTRAMUSCULAR | Status: DC | PRN
  Administered 2017-02-08: 50 ug via INTRAVENOUS
  Filled 2017-02-08: qty 2

## 2017-02-08 NOTE — ED Triage Notes (Signed)
Per [facility] pt from after mechanical fall, c/o right knee pain with movement. Unable to bear weight. No deformity noted. No neck or back pain.

## 2017-02-08 NOTE — Discharge Instructions (Signed)
Use Tylenol, as needed for pain.  Use ice and sore spots today and tomorrow, after that use heat.

## 2017-02-08 NOTE — ED Notes (Signed)
Patient transported to X-ray 

## 2017-02-08 NOTE — ED Provider Notes (Signed)
[facility] DEPT Provider Note   CSN: [id number] Arrival date & time: 02/08/17  8315     History   Chief Complaint Chief Complaint  Patient presents with  . Fall    HPI [name] is a 71 y.o. female.  She reports walking in her home today and falling suddenly.  She has had prior similar falls, without warning.  She describes injury to the right hip only.  She denies recent fever, chills, nausea, vomiting, weakness or dizziness.  There are no other known modifying factors.   HPI  Past Medical History:  Diagnosis Date  . Allergic rhinitis   . Anxiety   . Back pain   . CAD (coronary artery disease)     (Nonobstructive coronary artery disease 40% LAD stenosis)  2011  . CHF (congestive heart failure) ([place])   . Chronic kidney disease    STAGE 4 KIDNEY DISEASE  . Depression   . DJD (degenerative joint disease)   . DM (diabetes mellitus) ([place])   . GERD (gastroesophageal reflux disease)   . H/O hiatal hernia   . Headache(784.0)   . HTN (hypertension)   . Hypothyroidism   . Myocardial infarction ([place]) 2013  . OSA (obstructive sleep apnea)   . Stroke [facility])     Patient Active Problem List   Diagnosis Date Noted  . Type 2 diabetes mellitus without complication ([place]) 17/61/6073  . HLD (hyperlipidemia) 03/01/2014  . Focal seizures ([place]) 10/21/2013  . TIA (transient ischemic attack) 10/21/2013  . CHEST PAIN 01/24/2010  . BRADYCARDIA 12/23/2009  . Diabetes ([place]) 08/04/2008  . HYPOTHYROIDISM 03/06/2007  . OBESITY 03/06/2007  . OBSTRUCTIVE SLEEP APNEA 03/06/2007  . HYPERTENSION, BENIGN 03/06/2007  . CORONARY ARTERY DISEASE 03/06/2007  . ALLERGIC RHINITIS 03/06/2007  . DEGENERATIVE JOINT DISEASE, GENERALIZED 03/06/2007  . BACK PAIN, CHRONIC 03/06/2007    Past Surgical History:  Procedure Laterality Date  . ABDOMINAL HYSTERECTOMY    . CARPAL TUNNEL RELEASE    . CERVICAL DISC SURGERY    . REPLACEMENT TOTAL KNEE    . SALPINGOOPHORECTOMY    .  UVULOPALATOPHARYNGOPLASTY      OB History    No data available       Home Medications    Prior to Admission medications   Medication Sig Start Date End Date Taking? Authorizing Provider  ALPRAZolam Duanne Moron) 0.5 MG tablet Take 0.5 mg by mouth at bedtime as needed for anxiety.   Yes [provider]  aspirin 81 MG tablet Take 81 mg by mouth daily.     Yes [provider]  atorvastatin (LIPITOR) 20 MG tablet Take 20 mg by mouth daily at 6 PM.  09/15/13  Yes [provider]  buPROPion (WELLBUTRIN XL) 150 MG 24 hr tablet Take 150 mg by mouth daily.  12/01/13  Yes [provider]  Cholecalciferol (VITAMIN D-3) 5000 UNITS TABS Take 1,000 Units by mouth daily.    Yes [provider]  desvenlafaxine (PRISTIQ) 50 MG 24 hr tablet Take 50 mg by mouth daily.     Yes [provider]  docusate sodium (COLACE) 100 MG capsule Take 100 mg by mouth daily as needed for mild constipation.    Yes [provider]  fluticasone (FLONASE) 50 MCG/ACT nasal spray Place 2 sprays into both nostrils daily as needed for allergies.    Yes [provider]  furosemide (LASIX) 40 MG tablet Take 40 mg by mouth every other day.    Yes [provider]  gabapentin (NEURONTIN) 600 MG tablet Take 600 mg by mouth 2 (two) times daily.  02/14/14  Yes [provider]  glimepiride (AMARYL) 4 MG tablet Take 4 mg by mouth daily.  01/05/14  Yes [provider]  levETIRAcetam (KEPPRA) 500 MG tablet Take 1 tablet (500 mg total) by mouth 2 (two) times daily. 05/10/16  Yes Dennie Bible, NP  levothyroxine (SYNTHROID, LEVOTHROID) 137 MCG tablet Take 137 mcg by mouth daily before breakfast.   Yes [provider]  meclizine (ANTIVERT) 25 MG tablet Take 25 mg by mouth 3 (three) times daily as needed for dizziness. 1/2 to one tid   Yes [provider]  metoprolol (LOPRESSOR) 50 MG tablet Take 50 mg by mouth 2 (two) times daily.   01/05/14  Yes [provider]  omeprazole (PRILOSEC) 40 MG capsule Take 40 mg by mouth daily.  02/12/14  Yes [provider]  ONGLYZA 2.5 MG TABS tablet Take 2.5 mg by mouth daily. 09/15/13  Yes [provider]  pioglitazone (ACTOS) 30 MG tablet Take 30 mg by mouth daily. 12/13/16  Yes [provider]  rOPINIRole (REQUIP) 0.25 MG tablet Take 0.25 mg by mouth at bedtime.     Yes [provider]  tizanidine (ZANAFLEX) 2 MG capsule Take 2 mg by mouth at bedtime.   Yes [provider]  traMADol (ULTRAM) 50 MG tablet Take 50 mg by mouth every 6 (six) hours as needed for moderate pain.    Yes [provider]  traZODone (DESYREL) 50 MG tablet Take 50 mg by mouth 2 (two) times daily. Patient takes 1 pill and a half. 10/01/13  Yes [provider]    Family History Family History  Problem Relation Age of Onset  . Breast cancer Mother   . Breast cancer Sister     Social History Social History  Substance Use Topics  . Smoking status: Never Smoker  . Smokeless tobacco: Never Used  . Alcohol use No     Allergies   Codeine; Meperidine hcl; Morphine; Nylon; and Penicillins   Review of Systems Review of Systems  All other systems reviewed and are negative.    Physical Exam Updated Vital Signs BP (!) 159/58 (BP Location: Left Arm)   Pulse (!) 56   Temp 98.1 F (36.7 C) (Oral)   Resp 16   SpO2 95%   Physical Exam  Constitutional: She is oriented to person, place, and time. She appears well-developed.  Elderly, obese  HENT:  Head: Normocephalic and atraumatic.  Eyes: Pupils are equal, round, and reactive to light. Conjunctivae and EOM are normal.  Neck: Normal range of motion and phonation normal. Neck supple.  Cardiovascular: Normal rate and regular rhythm.   Pulmonary/Chest: Effort normal and breath sounds normal. She exhibits no tenderness.  Abdominal: Soft. She exhibits no distension. There is no tenderness. There  is no guarding.  Musculoskeletal:  Tender right hip and resists motion, both actively and passively, secondary to pain.  No deformity of the large joints.  No right knee effusion or localized tenderness.  Neurological: She is alert and oriented to person, place, and time. She exhibits normal muscle tone.  Skin: Skin is warm and dry.  Psychiatric: She has a normal mood and affect. Her behavior is normal. Judgment and thought content normal.  Nursing note and vitals reviewed.    ED Treatments / Results  Labs (all labs ordered are listed, but only abnormal results are displayed) Labs Reviewed  BASIC METABOLIC  PANEL - Abnormal; Notable for the following:       Result Value   Glucose, Bld 117 (*)    Creatinine, Ser 1.07 (*)    GFR calc non Af Amer 50 (*)    GFR calc Af Amer 58 (*)    All other components within normal limits  CBC WITH DIFFERENTIAL/PLATELET  PROTIME-INR  TYPE AND SCREEN    EKG  EKG Interpretation  Date/Time:  Friday February 08 2017 10:34:01 EDT Ventricular Rate:  54 PR Interval:    QRS Duration: 91 QT Interval:  507 QTC Calculation: 481 R Axis:   83 Text Interpretation:  Sinus rhythm Anteroseptal infarct, age indeterminate since last tracing no significant change Confirmed by Daleen Bo [phone]) on 02/08/2017 10:54:27 AM       Radiology Dg Hip Unilat With Pelvis 2-3 Views Right  Result Date: 02/08/2017 CLINICAL DATA:  Fall at home this morning with right-sided hip pain. Initial encounter. EXAM: DG HIP (WITH OR WITHOUT PELVIS) 2-3V RIGHT COMPARISON:  09/06/2016 FINDINGS: There is no evidence of hip fracture or dislocation when accounting for stable degenerative spurring overlapping the femoral head neck junction. No evidence of bone lesion or erosion. Lower lumbar facet arthropathy. IMPRESSION: No acute finding or change from 09/06/2016. Electronically Signed   By: Monte Fantasia M.D.   On: 02/08/2017 11:06    Procedures Procedures (including critical  care time)  Medications Ordered in ED Medications  ondansetron (ZOFRAN) injection 4 mg (4 mg Intravenous Given 02/08/17 1041)     Initial Impression / Assessment and Plan / ED Course  I have reviewed the triage vital signs and the nursing notes.  Pertinent labs & imaging results that were available during my care of the patient were reviewed by me and considered in my medical decision making (see chart for details).  Clinical Course as of Feb 08 1753  Fri Feb 08, 2017  1348 At this time she is able to ambulate, while wearing the knee immobilizer.  Findings discussed with patient and son, all questions answered  [EW]    Clinical Course User Index [EW] Daleen Bo, MD     Patient Vitals for the past 24 hrs:  BP Temp Temp src Pulse Resp SpO2  02/08/17 1304 (!) 159/58 - - (!) 56 16 95 %  02/08/17 1104 (!) 144/65 - - (!) 58 13 92 %  02/08/17 0933 (!) 170/54 98.1 F (36.7 C) Oral 60 16 96 %    1:51 PM Reevaluation with update and discussion. After initial assessment and treatment, an updated evaluation reveals she is sitting up, comfortably, now. Essie Lagunes L       Final Clinical Impressions(s) / ED Diagnoses   Final diagnoses:  Fall, initial encounter  Contusion of multiple sites of right lower extremity, initial encounter   Likely mechanical fall without serious injury.  Suspect contusion and muscle strain causing pain.  Nursing Notes Reviewed/ Care Coordinated Applicable Imaging Reviewed Interpretation of Laboratory Data incorporated into ED treatment  The patient appears reasonably screened and/or stabilized for discharge and I doubt any other medical condition or other [facility] requiring further screening, evaluation, or treatment in the ED at this time prior to discharge.  Plan: Home Medications-continue usual medications; Home Treatments-rest, fluids; return here if the recommended treatment, does not improve the symptoms; Recommended follow up-PCP, prn   New  Prescriptions Discharge Medication List as of 02/08/2017  1:48 PM       Daleen Bo, MD 02/08/17 1755

## 2017-02-08 NOTE — ED Notes (Signed)
Bed: FO27 Expected date:  Expected time:  Means of arrival:  Comments: 71 yo fall; knee injury

## 2017-03-15 ENCOUNTER — Other Ambulatory Visit: Payer: Self-pay | Admitting: Nurse Practitioner

## 2017-05-20 ENCOUNTER — Encounter: Payer: Self-pay | Admitting: Neurology

## 2017-05-20 ENCOUNTER — Ambulatory Visit: Payer: Medicare Other | Admitting: Neurology

## 2017-05-20 VITALS — BP 157/71 | HR 53 | Ht 59.5 in | Wt 217.2 lb

## 2017-05-20 DIAGNOSIS — R569 Unspecified convulsions: Secondary | ICD-10-CM | POA: Diagnosis not present

## 2017-05-20 DIAGNOSIS — E785 Hyperlipidemia, unspecified: Secondary | ICD-10-CM

## 2017-05-20 DIAGNOSIS — I10 Essential (primary) hypertension: Secondary | ICD-10-CM | POA: Diagnosis not present

## 2017-05-20 MED ORDER — LEVETIRACETAM 500 MG PO TABS: 500.0000 mg | ORAL_TABLET | Freq: Two times a day (BID) | ORAL | 3 refills | Status: AC

## 2017-05-20 NOTE — Progress Notes (Signed)
STROKE NEUROLOGY FOLLOW UP NOTE  NAME: [name] DOB: Jul 19, 1945  REASON FOR VISIT: seizure / stroke follow up HISTORY FROM: pt and chart  Today we had the pleasure of seeing [name] in follow-up at our Neurology Clinic. Pt was accompanied by no one.   History Summary 72 yo white F with PMH of CAD, HTN, DM, HLD, OSA, CKD stage IV and several episodes of LOC presented as hospital discharge follow up.  She stated that about 4 years ago, she had episode of pressure at back of head and then fell and became unresponsive. She was sent to [facility] and was told to have mini-stroke. Discharged with no medication. For the last 6 month, she had total about 20 similar episodes, 12 of them she had LOC and 8 out of the 20 she did not lose consciousness. With the ones she was awake, she stated that she felt her left leg started to jerk, shake, she will have to sit down, otherwise will fall, lasting 5-52min, no arm or face involvement, but has some slurry speech. She never seek for medical attention. On 10/21/13, when she came back from outside, at her front doorstep, she felt the episodes came again, she had to sit down, and then she woke up in the hospital. She can not remember what happened to her. As per chart, she had left face twitching, left arm and leg shaking. MRI and MRA normal, EEG normal and other work up unrevealing. She was treated with keppra and no more spells. She was discharged with ASA and lipitor as well as keppra.  11/25/13 follow up - the patient has been doing well. No seizure spells. She stated that she did have two falls but they are mechanical fall without LOC and not similar feeling of her episodes. She is on medications for HTN and DM, she stated that her sugar and BP level at home were good. She can not have contrast MRI done due to her kidney function.  03/01/14 follow up - the patient has been doing well. No recurrent seizure episodes. She is not driving. No  neurological complains. Followed up with cardiology in 11/2013 and was doing well from cardiology standpoint. Next visit with PCP in Jan 2016. BP in clinic today 131/60.  05/11/14 follow up - pt was doing well. No recurrent seizure episodes. Requesting to back to drive. No complains. BP 119/75.  05/12/15 follow up (CM) - 72 year old female returns for follow-up. No seizures in the last year remains on Keppra 500 twice daily without side effects. Blood pressure elevated in the office today at 161/68 however she claims she takes her blood pressure has been in normal range. Her CBGs have ranged from 160 to 200. She says she has not been eating  correctly. She has not had further stroke or TIA symptoms. She remains on aspirin and Lipitor and her labs are followed by her primary care provider Dr. Dema Severin. She returns for reevaluation.  05/10/16 follow up (CM) - 72 year old female returns for yearly follow-up. She has a history of seizure disorder and is currently on Keppra 500 twice daily without side effects no seizures in the last 2 years. In addition she has a history of stroke and is currently on aspirin with minimal bruising and no bleeding. No further stroke or TIA symptoms since last seen. Blood pressure in the office today 138/69. She claims recent labs were done at Dr. Orest Dikes office and were stable and do not have access to  those. She returns for reevaluation  Interval History During the interval time, pt has been doing well.  No recurrent seizure-like activity.  Currently, intermittent driving.  Still on Keppra.  Sugar controlled well at home.  Patient stated that her PCP has taken her off all diabetes medication.  Also her PCP decreased her blood pressure medication at home, and her BP at home currently runs 150-160s.  Today in clinic BP 157/71.  Still on aspirin and Lipitor without side effect.  REVIEW OF SYSTEMS: Full 14 system review of systems performed and notable only for those listed below and in  HPI above, all others are negative:  Constitutional: fatigue, chills Cardiovascular:  Ear/Nose/Throat:  Skin: N/A  Eyes:  Respiratory: N/A  Gastroitestinal: N/A  Genitourinary:  Hematology/Lymphatic: N/A  Endocrine:  Musculoskeletal:  Allergy/Immunology: N/A  Neurological: Headache, restless legs, frequent waking Psychiatric: anxious/nervous  The following represents the patient's updated allergies and side effects list: Allergies  Allergen Reactions  . Codeine Itching  . Meperidine Hcl Itching  . Morphine Itching  . Nylon Other (See Comments)    Stitches cause infection  . Penicillins Hives, Itching and Rash    Has patient had a PCN reaction causing immediate rash, facial/tongue/throat swelling, SOB or lightheadedness with hypotension: Yes Has patient had a PCN reaction causing severe rash involving mucus membranes or skin necrosis: Yes Has patient had a PCN reaction that required hospitalization: No Has patient had a PCN reaction occurring within the last 10 years: No If all of the above answers are "NO", then may proceed with Cephalosporin use.     Labs since last visit of relevance include the following: Results for orders placed or performed during the hospital encounter of 95/28/41  Basic metabolic panel  Result Value Ref Range   Sodium 143 135 - 145 mmol/L   Potassium 3.8 3.5 - 5.1 mmol/L   Chloride 105 101 - 111 mmol/L   CO2 29 22 - 32 mmol/L   Glucose, Bld 117 (H) 65 - 99 mg/dL   BUN 11 6 - 20 mg/dL   Creatinine, Ser 1.07 (H) 0.44 - 1.00 mg/dL   Calcium 9.1 8.9 - 10.3 mg/dL   GFR calc non Af Amer 50 (L) >60 mL/min   GFR calc Af Amer 58 (L) >60 mL/min   Anion gap 9 5 - 15  CBC WITH DIFFERENTIAL  Result Value Ref Range   WBC 8.3 4.0 - 10.5 K/uL   RBC 4.49 3.87 - 5.11 MIL/uL   Hemoglobin 13.1 12.0 - 15.0 g/dL   HCT 41.6 36.0 - 46.0 %   MCV 92.7 78.0 - 100.0 fL   MCH 29.2 26.0 - 34.0 pg   MCHC 31.5 30.0 - 36.0 g/dL   RDW 14.6 11.5 - 15.5 %   Platelets  160 150 - 400 K/uL   Neutrophils Relative % 68 %   Neutro Abs 5.6 1.7 - 7.7 K/uL   Lymphocytes Relative 25 %   Lymphs Abs 2.1 0.7 - 4.0 K/uL   Monocytes Relative 4 %   Monocytes Absolute 0.4 0.1 - 1.0 K/uL   Eosinophils Relative 3 %   Eosinophils Absolute 0.2 0.0 - 0.7 K/uL   Basophils Relative 0 %   Basophils Absolute 0.0 0.0 - 0.1 K/uL  Protime-INR  Result Value Ref Range   Prothrombin Time 13.1 11.4 - 15.2 seconds   INR 1.00   Type and screen [place]  Result Value Ref Range   ABO/RH(D) A NEG  Antibody Screen NEG    Sample Expiration 02/11/2017     The neurologically relevant items on the patient's problem list were reviewed on today's visit.  Neurologic Examination  A problem focused neurological exam (12 or more points of the single system neurologic examination, vital signs counts as 1 point, cranial nerves count for 8 points) was performed.  Blood pressure (!) 157/71, pulse (!) 53, height 4' 11.5" (1.511 m), weight 217 lb 3.2 oz (98.5 kg).  General - obese, well developed, in no apparent distress.  Ophthalmologic - Sharp disc margins OU.  Cardiovascular - Regular rate and rhythm with no murmur.  Mental Status -  Level of arousal and orientation to time, place, and person were intact. Language including expression, naming, repetition, comprehension, reading, and writing was assessed and found intact.  Cranial Nerves II - XII - II - Vision intact OU. III, IV, VI - Extraocular movements intact. V - Facial sensation intact bilaterally. VII - Facial movement intact bilaterally. VIII - Hearing & vestibular intact bilaterally. X - Palate elevates symmetrically. XI - Chin turning & shoulder shrug intact bilaterally. XII - Tongue protrusion intact.  Motor Strength - The patient's strength was normal in all extremities and pronator drift was absent.  Bulk was normal and fasciculations were absent.   Motor Tone - Muscle tone was assessed at the  neck and appendages and was normal.  Reflexes - The patient's reflexes were normal in all extremities and she had no pathological reflexes.  Sensory - Light touch, temperature/pinprick, vibration and proprioception, and Romberg testing were assessed and were normal.    Coordination - The patient had normal movements in the hands and feet with no ataxia or dysmetria.  Tremor was absent.  Gait and Station - The patient's transfers, posture, gait, station, and turns were observed as normal.  Data reviewed: I personally reviewed the images and agree with the radiology interpretations.  MRI and MRA 09/2013 -  No acute infarct  Negative MRA of the head.  EEG - normal study  2D echo - Left ventricle: E/e&'>14 suggestive of elevated LV filling pressures. The cavity size was normal. Systolic function was normal. The estimated ejection fraction was in the range of 55% to 60%. There was an increased relative contribution of atrial contraction to ventricular filling. Doppler parameters are consistent with abnormal left ventricular relaxation (grade 1 diastolic dysfunction). - Pericardium, extracardiac: A trivial, free-flowing pericardial effusion was identified along the left ventricular free wall. The fluid had no internal echoes.There was no evidence of hemodynamic Compromise.  CUS  Right: mild calcific plaque origin and proximal ICA and ECA. Left: mild mixed plaque origin ICA. Bilateral: 1-39% ICA stenosis. Vertebral artery flow is antegrade. ICA/CCA ratio: R-1.57 L-1.84.  Component     Latest Ref Rng 10/21/2013 10/22/2013  Cholesterol     0 - 200 mg/dL  118  Triglycerides     <150 mg/dL  192 (H)  HDL     >39 mg/dL  30 (L)  Total CHOL/HDL Ratio       3.9  VLDL     0 - 40 mg/dL  38  LDL (calc)     0 - 99 mg/dL  50  Hemoglobin A1C     <5.7 % 6.4 (H)   Mean Plasma Glucose     <117 mg/dL 137 (H)   TSH     0.350 - 4.500 uIU/mL  4.350    Assessment: As you may recall, she is  a 72 y.o. Caucasian  female with a diagnosis of focal seizure. She had episode of left leg jerking, with or without LOC for about 4 years and more frequent for the last 6 months. She was admitted in 09/2013 for LOC with left face twitch, left arm and leg jerking. Was given keppra IV 1000mg  load and then on keppra 500mg  bid. She does have stroke risk factors so she had also stroke work up which was negative. MRI and EEG negative. She can not have contrast MRI due to kidney function. She had no more spells since discharge.  Continue on Keppra, currently intermittent driving.  Patient would like to continue her keppra for seizure control. Also continue ASA and lipitor for stroke prevention.  Plan:  - continue keppra 500mg  bid for seizure control - continue ASA and lipitor for stroke prevention - able to drive as no recurrent seizure for years on keppra. However, you are recommended to drive during the day not at night, no long distance and drive in familiar roads. - Follow up with your primary care physician for stroke risk factor modification. Recommend maintain blood pressure goal <130/80, diabetes with hemoglobin A1c goal below 6.5% and lipids with LDL cholesterol goal below 70 mg/dL.  - maintain seizure precautions - check BP and glucose at home and record and bring over to PCP next visit for BP control.  - follow up as needed.   I spent more than 25 minutes of face to face time with the patient. Greater than 50% of time was spent in counseling and coordination of care. We discussed seizure precaution, driving recommendations, and continue Keppra.   No orders of the defined types were placed in this encounter.  Patient Instructions  - continue keppra 500mg  bid for seizure control - continue ASA and lipitor for stroke prevention - able to drive as no recurrent seizures. However, you are recommended to drive during the day not at night, no long distance and drive in familiar roads. - Follow up with  your primary care physician for stroke risk factor modification. Recommend maintain blood pressure goal <130/80, diabetes with hemoglobin A1c goal below 6.5% and lipids with LDL cholesterol goal below 70 mg/dL.  - maintain seizure precautions - check BP and glucose at home and record and bring over to PCP next visit for BP control.  - follow up as needed.    Rosalin Hawking, MD PhD [facility] [address], [place], [place] [address] [phone]

## 2017-05-20 NOTE — Patient Instructions (Addendum)
-   continue keppra 500mg  bid for seizure control - continue ASA and lipitor for stroke prevention - able to drive as no recurrent seizures. However, you are recommended to drive during the day not at night, no long distance and drive in familiar roads. - Follow up with your primary care physician for stroke risk factor modification. Recommend maintain blood pressure goal <130/80, diabetes with hemoglobin A1c goal below 6.5% and lipids with LDL cholesterol goal below 70 mg/dL.  - maintain seizure precautions - check BP and glucose at home and record and bring over to PCP next visit for BP control.  - follow up as needed.

## 2018-12-05 ENCOUNTER — Telehealth: Payer: Self-pay | Admitting: Cardiology

## 2018-12-05 NOTE — Telephone Encounter (Signed)
Spoke with patient who is scheduled to see Dr. Percival Spanish on 8/12. She reports her husband will have to drive her to the appointment. Explained this is OK, but that he will need to wait either in the car or outside our lobby as we are trying to comply with social distancing guidelines, limiting the number of persons in our office at any give time, in efforts to limit potential coronavirus exposure. She voiced understanding.

## 2018-12-05 NOTE — Telephone Encounter (Signed)
  Patient is calling because she needs her husband to come to the appt with her and wanted to make sure that was okay

## 2018-12-09 NOTE — Progress Notes (Signed)
Cardiology Office Note   Date:  12/10/2018   ID:  Tzirel, Leonor 1945/10/17, MRN [id number]  PCP:  Harlan Stains, MD  Cardiologist:   Minus Breeding, MD Referring:  Harlan Stains, MD  Chief Complaint  Patient presents with  . Coronary Artery Disease      History of Present Illness: [name] is a 73 y.o. female who is referred by Harlan Stains, MD for evaluation of CAD.  She has a history of coronary artery disease as described below.  EF was 60% on echo in 2015.  At that time I saw her for follow up of a trivial pericardial effusion.  Since I last saw her she has had no acute cardiovascular complaints or hospitalizations.  She seems to be very sedentary.  She does some walking around her house but otherwise does not get out much partly because of the virus.  She has a little problem with limited balance.  She does not describe any presyncope or syncope.  She has occasional palpitations that might last for a few minutes.  She describes her heart is beating fast.  She cannot bring this on and is not associated with other symptoms.  She says sometimes at night she feels like she has to take a deep breath.  She is not describing PND or orthopnea.  She used to use CPAP but she has not used this in a few years.  She is not describing chest pressure, neck or arm discomfort.   Past Medical History:  Diagnosis Date  . Anxiety   . CAD (coronary artery disease)     (Nonobstructive coronary artery disease 40% LAD stenosis)  2011  . CHF (congestive heart failure) ([place])   . Chronic kidney disease    STAGE 4 KIDNEY DISEASE  . Depression   . DJD (degenerative joint disease)   . DM (diabetes mellitus) ([place])   . GERD (gastroesophageal reflux disease)   . H/O hiatal hernia   . HTN (hypertension)   . Hypothyroidism   . OSA (obstructive sleep apnea)   . Stroke [facility])     Past Surgical History:  Procedure Laterality Date  . ABDOMINAL HYSTERECTOMY    . CARPAL TUNNEL  RELEASE    . CERVICAL DISC SURGERY    . REPLACEMENT TOTAL KNEE    . SALPINGOOPHORECTOMY    . UVULOPALATOPHARYNGOPLASTY       Current Outpatient Medications  Medication Sig Dispense Refill  . ALPRAZolam (XANAX) 0.5 MG tablet Take 0.5 mg by mouth at bedtime as needed for anxiety.    Marland Kitchen aspirin 81 MG tablet Take 81 mg by mouth daily.      Marland Kitchen atorvastatin (LIPITOR) 20 MG tablet Take 20 mg by mouth daily at 6 PM.     . buPROPion (WELLBUTRIN XL) 150 MG 24 hr tablet Take 150 mg by mouth daily.   2  . Cholecalciferol (VITAMIN D-3) 5000 UNITS TABS Take 1,000 Units by mouth daily.     Marland Kitchen desvenlafaxine (PRISTIQ) 50 MG 24 hr tablet Take 50 mg by mouth daily.      Marland Kitchen docusate sodium (COLACE) 100 MG capsule Take 100 mg by mouth daily as needed for mild constipation.     . fluticasone (FLONASE) 50 MCG/ACT nasal spray Place 2 sprays into both nostrils daily as needed for allergies.     . furosemide (LASIX) 40 MG tablet Take 40 mg by mouth every other day.     . gabapentin (NEURONTIN) 600 MG tablet  Take 600 mg by mouth 2 (two) times daily.   2  . glimepiride (AMARYL) 4 MG tablet Take 4 mg by mouth daily.     Marland Kitchen levETIRAcetam (KEPPRA) 500 MG tablet Take 1 tablet (500 mg total) by mouth 2 (two) times daily. 180 tablet 3  . levothyroxine (SYNTHROID, LEVOTHROID) 137 MCG tablet Take 137 mcg by mouth daily before breakfast.    . meclizine (ANTIVERT) 25 MG tablet Take 25 mg by mouth 3 (three) times daily as needed for dizziness. 1/2 to one tid    . metoprolol (LOPRESSOR) 50 MG tablet Take 50 mg by mouth 2 (two) times daily.     Marland Kitchen omeprazole (PRILOSEC) 40 MG capsule Take 40 mg by mouth daily.     . ONGLYZA 2.5 MG TABS tablet Take 2.5 mg by mouth daily.    . pioglitazone (ACTOS) 30 MG tablet Take 30 mg by mouth daily.  0  . rOPINIRole (REQUIP) 0.25 MG tablet Take 0.25 mg by mouth at bedtime.      . tizanidine (ZANAFLEX) 2 MG capsule Take 2 mg by mouth at bedtime.    . traMADol (ULTRAM) 50 MG tablet Take 50 mg by  mouth every 6 (six) hours as needed for moderate pain.     . traZODone (DESYREL) 50 MG tablet Take 50 mg by mouth 2 (two) times daily. Patient takes 1 pill and a half.     No current facility-administered medications for this visit.     Allergies:   Codeine, Meperidine hcl, Morphine, Nylon, and Penicillins    Social History:  The patient  reports that she has never smoked. She has never used smokeless tobacco. She reports that she does not drink alcohol or use drugs.   Family History:  The patient's family history includes Breast cancer in her mother and sister.    ROS:  Please see the history of present illness.   Otherwise, review of systems are positive for none.   All other systems are reviewed and negative.    PHYSICAL EXAM: VS:  BP 126/62   Pulse (!) 56   Temp 98.1 F (36.7 C) (Temporal)   Ht 4' 11.5" (1.511 m)   Wt 212 lb 12.8 oz (96.5 kg)   SpO2 96%   BMI 42.26 kg/m  , BMI Body mass index is 42.26 kg/m. GENERAL:  Well appearing HEENT:  Pupils equal round and reactive, fundi not visualized, oral mucosa unremarkable NECK:  No jugular venous distention, waveform within normal limits, carotid upstroke brisk and symmetric, no bruits, no thyromegaly LYMPHATICS:  No cervical, inguinal adenopathy LUNGS:  Clear to auscultation bilaterally BACK:  No CVA tenderness CHEST:  Unremarkable HEART:  PMI not displaced or sustained,S1 and S2 within normal limits, no S3, no S4, no clicks, no rubs, no murmurs ABD:  Flat, positive bowel sounds normal in frequency in pitch, no bruits, no rebound, no guarding, no midline pulsatile mass, no hepatomegaly, no splenomegaly EXT:  2 plus pulses throughout, no edema, no cyanosis no clubbing SKIN:  No rashes no nodules NEURO:  Cranial nerves II through XII grossly intact, motor grossly intact throughout PSYCH:  Cognitively intact, oriented to person place and time  EKG:  EKG is ordered today. The ekg ordered today demonstrates sinus rhythm, rate  56, low voltage in limb and chest leads, axis within normal limits, intervals within normal limits, no acute ST-T wave changes.   Recent Labs: No results found for requested labs within last 8760 hours.  Lipid Panel    Component Value Date/Time   CHOL 118 10/22/2013 0440   TRIG 192 (H) 10/22/2013 0440   HDL 30 (L) 10/22/2013 0440   CHOLHDL 3.9 10/22/2013 0440   VLDL 38 10/22/2013 0440   LDLCALC 50 10/22/2013 0440      Wt Readings from Last 3 Encounters:  12/10/18 212 lb 12.8 oz (96.5 kg)  05/20/17 217 lb 3.2 oz (98.5 kg)  05/10/16 213 lb 12.8 oz (97 kg)      Other studies Reviewed: Additional studies/ records that were reviewed today include: Dr. Orest Dikes office records. Review of the above records demonstrates:  Please see elsewhere in the note.     ASSESSMENT AND PLAN:  CAD:    The patient had nonobstructive coronary disease below.  It might be prudent to just screen her because of this with a POET (Plain Old Exercise Treadmill.  However, she would be able to walk on a treadmill.  In the absence of unstable symptoms I do not think that perfusion imaging or further angiographic imaging is indicated.  Rather she should continue with risk reduction.  PERICARDIAL EFFUSION:  This was trivial in the past.  No further testing is indicated.  There is no suggestion on physical exam or significant effusion.  DM: Her A1c apparently was low but is back up at 8.1 and she is having this followed closely by Dr. Dema Severin.  DYSLIPIDEMIA: Her lipid profile is reasonable with an LDL of 82 and HDL 37.  She will continue the meds as listed.  OBESITY: We discussed diet specifics.   Current medicines are reviewed at length with the patient today.  The patient does not have concerns regarding medicines.  The following changes have been made:  no change  Labs/ tests ordered today include:  No orders of the defined types were placed in this encounter.    Disposition:   FU with me as needed      Signed, Minus Breeding, MD  12/10/2018 3:20 PM    [facility]

## 2018-12-10 ENCOUNTER — Encounter: Payer: Self-pay | Admitting: Cardiology

## 2018-12-10 ENCOUNTER — Ambulatory Visit (INDEPENDENT_AMBULATORY_CARE_PROVIDER_SITE_OTHER): Payer: Medicare Other | Admitting: Cardiology

## 2018-12-10 ENCOUNTER — Other Ambulatory Visit: Payer: Self-pay

## 2018-12-10 VITALS — BP 126/62 | HR 56 | Temp 98.1°F | Ht 59.5 in | Wt 212.8 lb

## 2018-12-10 DIAGNOSIS — I313 Pericardial effusion (noninflammatory): Secondary | ICD-10-CM | POA: Diagnosis not present

## 2018-12-10 DIAGNOSIS — E118 Type 2 diabetes mellitus with unspecified complications: Secondary | ICD-10-CM

## 2018-12-10 DIAGNOSIS — I2510 Atherosclerotic heart disease of native coronary artery without angina pectoris: Secondary | ICD-10-CM | POA: Diagnosis not present

## 2018-12-10 DIAGNOSIS — C801 Malignant (primary) neoplasm, unspecified: Secondary | ICD-10-CM

## 2018-12-10 DIAGNOSIS — I3131 Malignant pericardial effusion in diseases classified elsewhere: Secondary | ICD-10-CM

## 2018-12-10 NOTE — Patient Instructions (Signed)
Medication Instructions:  Your physician recommends that you continue on your current medications as directed. Please refer to the Current Medication list given to you today.  If you need a refill on your cardiac medications before your next appointment, please call your pharmacy.   Lab work: NONE  Testing/Procedures: NONE  Follow-Up: AS NEEDED   

## 2018-12-10 NOTE — Addendum Note (Signed)
Addended by: Alvina Filbert B on: 12/10/2018 06:16 PM   Modules accepted: Orders

## 2019-05-29 ENCOUNTER — Ambulatory Visit: Payer: Medicare Other

## 2019-05-31 ENCOUNTER — Ambulatory Visit: Payer: Medicare Other

## 2019-06-06 ENCOUNTER — Ambulatory Visit: Payer: Medicare PPO

## 2019-06-09 ENCOUNTER — Ambulatory Visit: Payer: Medicare Other

## 2019-06-11 ENCOUNTER — Ambulatory Visit: Payer: Medicare Other

## 2019-06-11 ENCOUNTER — Ambulatory Visit: Payer: Medicare PPO | Attending: Internal Medicine

## 2019-06-11 DIAGNOSIS — Z23 Encounter for immunization: Secondary | ICD-10-CM | POA: Insufficient documentation

## 2019-06-11 NOTE — Progress Notes (Signed)
   Covid-19 Vaccination Clinic  Name:  [name]    MRN: [id number] DOB: 09-07-1945  06/11/2019  [name] was observed post Covid-19 immunization for 15 minutes without incidence. She was provided with Vaccine Information Sheet and instruction to access the V-Safe system.   [name] was instructed to call 911 with any severe reactions post vaccine: Marland Kitchen Difficulty breathing  . Swelling of your face and throat  . A fast heartbeat  . A bad rash all over your body  . Dizziness and weakness    Immunizations Administered    Name Date Dose VIS Date Route   Pfizer COVID-19 Vaccine 06/11/2019 10:10 AM 0.3 mL 04/10/2019 Intramuscular   Manufacturer: [employer], [employer]   Lot: [id number]   [place]: [id number]

## 2019-07-04 ENCOUNTER — Ambulatory Visit: Payer: Medicare PPO | Attending: Internal Medicine

## 2019-07-04 DIAGNOSIS — Z23 Encounter for immunization: Secondary | ICD-10-CM | POA: Insufficient documentation

## 2019-07-04 NOTE — Progress Notes (Signed)
   Covid-19 Vaccination Clinic  Name:  ZAYLEI PLUFF    MRN: [id number] DOB: [birth year]  07/04/2019  Ms. Isais was observed post Covid-19 immunization for 15 minutes without incident. She was provided with Vaccine Information Sheet and instruction to access the V-Safe system.   Ms. Muralles was instructed to call 911 with any severe reactions post vaccine: Marland Kitchen Difficulty breathing  . Swelling of face and throat  . A fast heartbeat  . A bad rash all over body  . Dizziness and weakness   Immunizations Administered    Name Date Dose VIS Date Route   Pfizer COVID-19 Vaccine 07/04/2019 10:36 AM 0.3 mL 04/10/2019 Intramuscular   Manufacturer: [place]   Lot: [id number]   [place]: [id number]

## 2019-07-30 DIAGNOSIS — Z885 Allergy status to narcotic agent status: Secondary | ICD-10-CM | POA: Diagnosis not present

## 2019-07-30 DIAGNOSIS — Z79891 Long term (current) use of opiate analgesic: Secondary | ICD-10-CM | POA: Diagnosis not present

## 2019-07-30 DIAGNOSIS — Z833 Family history of diabetes mellitus: Secondary | ICD-10-CM | POA: Diagnosis not present

## 2019-07-30 DIAGNOSIS — Z8673 Personal history of transient ischemic attack (TIA), and cerebral infarction without residual deficits: Secondary | ICD-10-CM | POA: Diagnosis not present

## 2019-07-30 DIAGNOSIS — E785 Hyperlipidemia, unspecified: Secondary | ICD-10-CM | POA: Diagnosis not present

## 2019-07-30 DIAGNOSIS — K219 Gastro-esophageal reflux disease without esophagitis: Secondary | ICD-10-CM | POA: Diagnosis not present

## 2019-07-30 DIAGNOSIS — E1165 Type 2 diabetes mellitus with hyperglycemia: Secondary | ICD-10-CM | POA: Diagnosis not present

## 2019-07-30 DIAGNOSIS — Z6841 Body Mass Index (BMI) 40.0 and over, adult: Secondary | ICD-10-CM | POA: Diagnosis not present

## 2019-07-30 DIAGNOSIS — I10 Essential (primary) hypertension: Secondary | ICD-10-CM | POA: Diagnosis not present

## 2019-07-30 DIAGNOSIS — Z880 Allergy status to penicillin: Secondary | ICD-10-CM | POA: Diagnosis not present

## 2019-07-30 DIAGNOSIS — E1142 Type 2 diabetes mellitus with diabetic polyneuropathy: Secondary | ICD-10-CM | POA: Diagnosis not present

## 2019-07-30 DIAGNOSIS — F339 Major depressive disorder, recurrent, unspecified: Secondary | ICD-10-CM | POA: Diagnosis not present

## 2019-07-30 DIAGNOSIS — E039 Hypothyroidism, unspecified: Secondary | ICD-10-CM | POA: Diagnosis not present

## 2019-07-30 DIAGNOSIS — R32 Unspecified urinary incontinence: Secondary | ICD-10-CM | POA: Diagnosis not present

## 2019-07-30 DIAGNOSIS — I252 Old myocardial infarction: Secondary | ICD-10-CM | POA: Diagnosis not present

## 2019-07-30 DIAGNOSIS — M1990 Unspecified osteoarthritis, unspecified site: Secondary | ICD-10-CM | POA: Diagnosis not present

## 2019-07-30 DIAGNOSIS — Z825 Family history of asthma and other chronic lower respiratory diseases: Secondary | ICD-10-CM | POA: Diagnosis not present

## 2019-07-30 DIAGNOSIS — Z7982 Long term (current) use of aspirin: Secondary | ICD-10-CM | POA: Diagnosis not present

## 2019-07-30 DIAGNOSIS — F419 Anxiety disorder, unspecified: Secondary | ICD-10-CM | POA: Diagnosis not present

## 2019-07-30 DIAGNOSIS — R569 Unspecified convulsions: Secondary | ICD-10-CM | POA: Diagnosis not present

## 2019-07-30 DIAGNOSIS — J309 Allergic rhinitis, unspecified: Secondary | ICD-10-CM | POA: Diagnosis not present

## 2019-07-30 DIAGNOSIS — Z7984 Long term (current) use of oral hypoglycemic drugs: Secondary | ICD-10-CM | POA: Diagnosis not present

## 2019-09-02 DIAGNOSIS — M791 Myalgia, unspecified site: Secondary | ICD-10-CM | POA: Diagnosis not present

## 2019-09-02 DIAGNOSIS — K219 Gastro-esophageal reflux disease without esophagitis: Secondary | ICD-10-CM | POA: Diagnosis not present

## 2019-09-02 DIAGNOSIS — E039 Hypothyroidism, unspecified: Secondary | ICD-10-CM | POA: Diagnosis not present

## 2019-09-02 DIAGNOSIS — Z79899 Other long term (current) drug therapy: Secondary | ICD-10-CM | POA: Diagnosis not present

## 2019-09-02 DIAGNOSIS — E1140 Type 2 diabetes mellitus with diabetic neuropathy, unspecified: Secondary | ICD-10-CM | POA: Diagnosis not present

## 2019-09-02 DIAGNOSIS — I129 Hypertensive chronic kidney disease with stage 1 through stage 4 chronic kidney disease, or unspecified chronic kidney disease: Secondary | ICD-10-CM | POA: Diagnosis not present

## 2019-09-02 DIAGNOSIS — G2581 Restless legs syndrome: Secondary | ICD-10-CM | POA: Diagnosis not present

## 2019-09-02 DIAGNOSIS — N183 Chronic kidney disease, stage 3 unspecified: Secondary | ICD-10-CM | POA: Diagnosis not present

## 2019-09-02 DIAGNOSIS — E1121 Type 2 diabetes mellitus with diabetic nephropathy: Secondary | ICD-10-CM | POA: Diagnosis not present

## 2019-10-26 DIAGNOSIS — F324 Major depressive disorder, single episode, in partial remission: Secondary | ICD-10-CM | POA: Diagnosis not present

## 2019-10-26 DIAGNOSIS — E785 Hyperlipidemia, unspecified: Secondary | ICD-10-CM | POA: Diagnosis not present

## 2019-10-26 DIAGNOSIS — I2510 Atherosclerotic heart disease of native coronary artery without angina pectoris: Secondary | ICD-10-CM | POA: Diagnosis not present

## 2019-10-26 DIAGNOSIS — F331 Major depressive disorder, recurrent, moderate: Secondary | ICD-10-CM | POA: Diagnosis not present

## 2019-10-26 DIAGNOSIS — E039 Hypothyroidism, unspecified: Secondary | ICD-10-CM | POA: Diagnosis not present

## 2019-10-26 DIAGNOSIS — E1140 Type 2 diabetes mellitus with diabetic neuropathy, unspecified: Secondary | ICD-10-CM | POA: Diagnosis not present

## 2019-10-26 DIAGNOSIS — E1121 Type 2 diabetes mellitus with diabetic nephropathy: Secondary | ICD-10-CM | POA: Diagnosis not present

## 2019-10-26 DIAGNOSIS — M170 Bilateral primary osteoarthritis of knee: Secondary | ICD-10-CM | POA: Diagnosis not present

## 2019-10-26 DIAGNOSIS — I129 Hypertensive chronic kidney disease with stage 1 through stage 4 chronic kidney disease, or unspecified chronic kidney disease: Secondary | ICD-10-CM | POA: Diagnosis not present

## 2019-11-25 DIAGNOSIS — I129 Hypertensive chronic kidney disease with stage 1 through stage 4 chronic kidney disease, or unspecified chronic kidney disease: Secondary | ICD-10-CM | POA: Diagnosis not present

## 2019-11-25 DIAGNOSIS — F324 Major depressive disorder, single episode, in partial remission: Secondary | ICD-10-CM | POA: Diagnosis not present

## 2019-11-25 DIAGNOSIS — I2510 Atherosclerotic heart disease of native coronary artery without angina pectoris: Secondary | ICD-10-CM | POA: Diagnosis not present

## 2019-11-25 DIAGNOSIS — E039 Hypothyroidism, unspecified: Secondary | ICD-10-CM | POA: Diagnosis not present

## 2019-11-25 DIAGNOSIS — F331 Major depressive disorder, recurrent, moderate: Secondary | ICD-10-CM | POA: Diagnosis not present

## 2019-11-25 DIAGNOSIS — E1140 Type 2 diabetes mellitus with diabetic neuropathy, unspecified: Secondary | ICD-10-CM | POA: Diagnosis not present

## 2019-11-25 DIAGNOSIS — M170 Bilateral primary osteoarthritis of knee: Secondary | ICD-10-CM | POA: Diagnosis not present

## 2019-11-25 DIAGNOSIS — E1121 Type 2 diabetes mellitus with diabetic nephropathy: Secondary | ICD-10-CM | POA: Diagnosis not present

## 2019-11-25 DIAGNOSIS — E785 Hyperlipidemia, unspecified: Secondary | ICD-10-CM | POA: Diagnosis not present

## 2019-12-03 DIAGNOSIS — E1121 Type 2 diabetes mellitus with diabetic nephropathy: Secondary | ICD-10-CM | POA: Diagnosis not present

## 2019-12-03 DIAGNOSIS — I129 Hypertensive chronic kidney disease with stage 1 through stage 4 chronic kidney disease, or unspecified chronic kidney disease: Secondary | ICD-10-CM | POA: Diagnosis not present

## 2019-12-03 DIAGNOSIS — E1140 Type 2 diabetes mellitus with diabetic neuropathy, unspecified: Secondary | ICD-10-CM | POA: Diagnosis not present

## 2019-12-03 DIAGNOSIS — G2581 Restless legs syndrome: Secondary | ICD-10-CM | POA: Diagnosis not present

## 2019-12-03 DIAGNOSIS — N183 Chronic kidney disease, stage 3 unspecified: Secondary | ICD-10-CM | POA: Diagnosis not present

## 2019-12-03 DIAGNOSIS — M5136 Other intervertebral disc degeneration, lumbar region: Secondary | ICD-10-CM | POA: Diagnosis not present

## 2019-12-03 DIAGNOSIS — E785 Hyperlipidemia, unspecified: Secondary | ICD-10-CM | POA: Diagnosis not present

## 2019-12-03 DIAGNOSIS — E039 Hypothyroidism, unspecified: Secondary | ICD-10-CM | POA: Diagnosis not present

## 2019-12-25 DIAGNOSIS — E119 Type 2 diabetes mellitus without complications: Secondary | ICD-10-CM | POA: Diagnosis not present

## 2019-12-28 DIAGNOSIS — F324 Major depressive disorder, single episode, in partial remission: Secondary | ICD-10-CM | POA: Diagnosis not present

## 2019-12-28 DIAGNOSIS — F331 Major depressive disorder, recurrent, moderate: Secondary | ICD-10-CM | POA: Diagnosis not present

## 2019-12-28 DIAGNOSIS — I129 Hypertensive chronic kidney disease with stage 1 through stage 4 chronic kidney disease, or unspecified chronic kidney disease: Secondary | ICD-10-CM | POA: Diagnosis not present

## 2019-12-28 DIAGNOSIS — E785 Hyperlipidemia, unspecified: Secondary | ICD-10-CM | POA: Diagnosis not present

## 2019-12-28 DIAGNOSIS — E039 Hypothyroidism, unspecified: Secondary | ICD-10-CM | POA: Diagnosis not present

## 2019-12-28 DIAGNOSIS — M170 Bilateral primary osteoarthritis of knee: Secondary | ICD-10-CM | POA: Diagnosis not present

## 2019-12-28 DIAGNOSIS — E1121 Type 2 diabetes mellitus with diabetic nephropathy: Secondary | ICD-10-CM | POA: Diagnosis not present

## 2019-12-28 DIAGNOSIS — E1140 Type 2 diabetes mellitus with diabetic neuropathy, unspecified: Secondary | ICD-10-CM | POA: Diagnosis not present

## 2019-12-28 DIAGNOSIS — I2510 Atherosclerotic heart disease of native coronary artery without angina pectoris: Secondary | ICD-10-CM | POA: Diagnosis not present

## 2019-12-31 DIAGNOSIS — F331 Major depressive disorder, recurrent, moderate: Secondary | ICD-10-CM | POA: Diagnosis not present

## 2020-01-01 DIAGNOSIS — Z1231 Encounter for screening mammogram for malignant neoplasm of breast: Secondary | ICD-10-CM | POA: Diagnosis not present

## 2020-01-22 DIAGNOSIS — M170 Bilateral primary osteoarthritis of knee: Secondary | ICD-10-CM | POA: Diagnosis not present

## 2020-01-22 DIAGNOSIS — F331 Major depressive disorder, recurrent, moderate: Secondary | ICD-10-CM | POA: Diagnosis not present

## 2020-01-22 DIAGNOSIS — F324 Major depressive disorder, single episode, in partial remission: Secondary | ICD-10-CM | POA: Diagnosis not present

## 2020-01-22 DIAGNOSIS — E785 Hyperlipidemia, unspecified: Secondary | ICD-10-CM | POA: Diagnosis not present

## 2020-01-22 DIAGNOSIS — E1140 Type 2 diabetes mellitus with diabetic neuropathy, unspecified: Secondary | ICD-10-CM | POA: Diagnosis not present

## 2020-01-22 DIAGNOSIS — E1121 Type 2 diabetes mellitus with diabetic nephropathy: Secondary | ICD-10-CM | POA: Diagnosis not present

## 2020-01-22 DIAGNOSIS — I2510 Atherosclerotic heart disease of native coronary artery without angina pectoris: Secondary | ICD-10-CM | POA: Diagnosis not present

## 2020-01-22 DIAGNOSIS — I129 Hypertensive chronic kidney disease with stage 1 through stage 4 chronic kidney disease, or unspecified chronic kidney disease: Secondary | ICD-10-CM | POA: Diagnosis not present

## 2020-01-22 DIAGNOSIS — E039 Hypothyroidism, unspecified: Secondary | ICD-10-CM | POA: Diagnosis not present

## 2020-01-27 DIAGNOSIS — E1140 Type 2 diabetes mellitus with diabetic neuropathy, unspecified: Secondary | ICD-10-CM | POA: Diagnosis not present

## 2020-02-26 DIAGNOSIS — M170 Bilateral primary osteoarthritis of knee: Secondary | ICD-10-CM | POA: Diagnosis not present

## 2020-02-26 DIAGNOSIS — I129 Hypertensive chronic kidney disease with stage 1 through stage 4 chronic kidney disease, or unspecified chronic kidney disease: Secondary | ICD-10-CM | POA: Diagnosis not present

## 2020-02-26 DIAGNOSIS — E785 Hyperlipidemia, unspecified: Secondary | ICD-10-CM | POA: Diagnosis not present

## 2020-02-26 DIAGNOSIS — I2510 Atherosclerotic heart disease of native coronary artery without angina pectoris: Secondary | ICD-10-CM | POA: Diagnosis not present

## 2020-02-26 DIAGNOSIS — F324 Major depressive disorder, single episode, in partial remission: Secondary | ICD-10-CM | POA: Diagnosis not present

## 2020-02-26 DIAGNOSIS — E1121 Type 2 diabetes mellitus with diabetic nephropathy: Secondary | ICD-10-CM | POA: Diagnosis not present

## 2020-02-26 DIAGNOSIS — F331 Major depressive disorder, recurrent, moderate: Secondary | ICD-10-CM | POA: Diagnosis not present

## 2020-02-26 DIAGNOSIS — E1140 Type 2 diabetes mellitus with diabetic neuropathy, unspecified: Secondary | ICD-10-CM | POA: Diagnosis not present

## 2020-02-26 DIAGNOSIS — E039 Hypothyroidism, unspecified: Secondary | ICD-10-CM | POA: Diagnosis not present

## 2020-03-15 DIAGNOSIS — E039 Hypothyroidism, unspecified: Secondary | ICD-10-CM | POA: Diagnosis not present

## 2020-03-15 DIAGNOSIS — M170 Bilateral primary osteoarthritis of knee: Secondary | ICD-10-CM | POA: Diagnosis not present

## 2020-03-15 DIAGNOSIS — F331 Major depressive disorder, recurrent, moderate: Secondary | ICD-10-CM | POA: Diagnosis not present

## 2020-03-15 DIAGNOSIS — E1140 Type 2 diabetes mellitus with diabetic neuropathy, unspecified: Secondary | ICD-10-CM | POA: Diagnosis not present

## 2020-03-15 DIAGNOSIS — I129 Hypertensive chronic kidney disease with stage 1 through stage 4 chronic kidney disease, or unspecified chronic kidney disease: Secondary | ICD-10-CM | POA: Diagnosis not present

## 2020-03-15 DIAGNOSIS — E785 Hyperlipidemia, unspecified: Secondary | ICD-10-CM | POA: Diagnosis not present

## 2020-03-15 DIAGNOSIS — F324 Major depressive disorder, single episode, in partial remission: Secondary | ICD-10-CM | POA: Diagnosis not present

## 2020-03-15 DIAGNOSIS — I2510 Atherosclerotic heart disease of native coronary artery without angina pectoris: Secondary | ICD-10-CM | POA: Diagnosis not present

## 2020-03-15 DIAGNOSIS — E1121 Type 2 diabetes mellitus with diabetic nephropathy: Secondary | ICD-10-CM | POA: Diagnosis not present

## 2020-04-12 DIAGNOSIS — G2581 Restless legs syndrome: Secondary | ICD-10-CM | POA: Diagnosis not present

## 2020-04-12 DIAGNOSIS — I129 Hypertensive chronic kidney disease with stage 1 through stage 4 chronic kidney disease, or unspecified chronic kidney disease: Secondary | ICD-10-CM | POA: Diagnosis not present

## 2020-04-12 DIAGNOSIS — E1121 Type 2 diabetes mellitus with diabetic nephropathy: Secondary | ICD-10-CM | POA: Diagnosis not present

## 2020-04-12 DIAGNOSIS — E785 Hyperlipidemia, unspecified: Secondary | ICD-10-CM | POA: Diagnosis not present

## 2020-04-12 DIAGNOSIS — E1140 Type 2 diabetes mellitus with diabetic neuropathy, unspecified: Secondary | ICD-10-CM | POA: Diagnosis not present

## 2020-04-12 DIAGNOSIS — E039 Hypothyroidism, unspecified: Secondary | ICD-10-CM | POA: Diagnosis not present

## 2020-04-12 DIAGNOSIS — G40909 Epilepsy, unspecified, not intractable, without status epilepticus: Secondary | ICD-10-CM | POA: Diagnosis not present

## 2020-04-12 DIAGNOSIS — N183 Chronic kidney disease, stage 3 unspecified: Secondary | ICD-10-CM | POA: Diagnosis not present

## 2020-04-12 DIAGNOSIS — F324 Major depressive disorder, single episode, in partial remission: Secondary | ICD-10-CM | POA: Diagnosis not present

## 2020-04-19 DIAGNOSIS — E1140 Type 2 diabetes mellitus with diabetic neuropathy, unspecified: Secondary | ICD-10-CM | POA: Diagnosis not present

## 2020-04-19 DIAGNOSIS — F324 Major depressive disorder, single episode, in partial remission: Secondary | ICD-10-CM | POA: Diagnosis not present

## 2020-04-19 DIAGNOSIS — E039 Hypothyroidism, unspecified: Secondary | ICD-10-CM | POA: Diagnosis not present

## 2020-04-19 DIAGNOSIS — R635 Abnormal weight gain: Secondary | ICD-10-CM | POA: Diagnosis not present

## 2020-04-19 DIAGNOSIS — E1121 Type 2 diabetes mellitus with diabetic nephropathy: Secondary | ICD-10-CM | POA: Diagnosis not present

## 2020-04-19 DIAGNOSIS — E785 Hyperlipidemia, unspecified: Secondary | ICD-10-CM | POA: Diagnosis not present

## 2020-04-19 DIAGNOSIS — K219 Gastro-esophageal reflux disease without esophagitis: Secondary | ICD-10-CM | POA: Diagnosis not present

## 2020-04-19 DIAGNOSIS — I2510 Atherosclerotic heart disease of native coronary artery without angina pectoris: Secondary | ICD-10-CM | POA: Diagnosis not present

## 2020-04-19 DIAGNOSIS — R1310 Dysphagia, unspecified: Secondary | ICD-10-CM | POA: Diagnosis not present

## 2020-04-19 DIAGNOSIS — M170 Bilateral primary osteoarthritis of knee: Secondary | ICD-10-CM | POA: Diagnosis not present

## 2020-04-19 DIAGNOSIS — F331 Major depressive disorder, recurrent, moderate: Secondary | ICD-10-CM | POA: Diagnosis not present

## 2020-04-19 DIAGNOSIS — I129 Hypertensive chronic kidney disease with stage 1 through stage 4 chronic kidney disease, or unspecified chronic kidney disease: Secondary | ICD-10-CM | POA: Diagnosis not present

## 2020-04-20 ENCOUNTER — Other Ambulatory Visit: Payer: Self-pay | Admitting: Gastroenterology

## 2020-04-20 DIAGNOSIS — R1310 Dysphagia, unspecified: Secondary | ICD-10-CM

## 2020-04-27 ENCOUNTER — Ambulatory Visit
Admission: RE | Admit: 2020-04-27 | Discharge: 2020-04-27 | Disposition: A | Discharge status: Home / Self Care | Payer: Medicare PPO | Source: Ambulatory Visit | Attending: Gastroenterology | Admitting: Gastroenterology

## 2020-04-27 ENCOUNTER — Other Ambulatory Visit: Payer: Self-pay | Admitting: Gastroenterology

## 2020-04-27 DIAGNOSIS — R1310 Dysphagia, unspecified: Secondary | ICD-10-CM

## 2020-04-27 DIAGNOSIS — K224 Dyskinesia of esophagus: Secondary | ICD-10-CM | POA: Diagnosis not present

## 2020-04-28 DIAGNOSIS — E1140 Type 2 diabetes mellitus with diabetic neuropathy, unspecified: Secondary | ICD-10-CM | POA: Diagnosis not present

## 2020-05-20 DIAGNOSIS — I2510 Atherosclerotic heart disease of native coronary artery without angina pectoris: Secondary | ICD-10-CM | POA: Diagnosis not present

## 2020-05-20 DIAGNOSIS — E785 Hyperlipidemia, unspecified: Secondary | ICD-10-CM | POA: Diagnosis not present

## 2020-05-20 DIAGNOSIS — I129 Hypertensive chronic kidney disease with stage 1 through stage 4 chronic kidney disease, or unspecified chronic kidney disease: Secondary | ICD-10-CM | POA: Diagnosis not present

## 2020-05-20 DIAGNOSIS — E1140 Type 2 diabetes mellitus with diabetic neuropathy, unspecified: Secondary | ICD-10-CM | POA: Diagnosis not present

## 2020-05-20 DIAGNOSIS — K219 Gastro-esophageal reflux disease without esophagitis: Secondary | ICD-10-CM | POA: Diagnosis not present

## 2020-05-20 DIAGNOSIS — F324 Major depressive disorder, single episode, in partial remission: Secondary | ICD-10-CM | POA: Diagnosis not present

## 2020-05-20 DIAGNOSIS — E1121 Type 2 diabetes mellitus with diabetic nephropathy: Secondary | ICD-10-CM | POA: Diagnosis not present

## 2020-05-20 DIAGNOSIS — E039 Hypothyroidism, unspecified: Secondary | ICD-10-CM | POA: Diagnosis not present

## 2020-05-20 DIAGNOSIS — M170 Bilateral primary osteoarthritis of knee: Secondary | ICD-10-CM | POA: Diagnosis not present

## 2020-05-29 DIAGNOSIS — E1140 Type 2 diabetes mellitus with diabetic neuropathy, unspecified: Secondary | ICD-10-CM | POA: Diagnosis not present

## 2020-05-30 ENCOUNTER — Other Ambulatory Visit: Payer: Self-pay | Admitting: Gastroenterology

## 2020-05-30 DIAGNOSIS — E1140 Type 2 diabetes mellitus with diabetic neuropathy, unspecified: Secondary | ICD-10-CM | POA: Diagnosis not present

## 2020-06-10 ENCOUNTER — Other Ambulatory Visit: Payer: Self-pay

## 2020-06-14 ENCOUNTER — Other Ambulatory Visit (HOSPITAL_COMMUNITY)
Admission: RE | Admit: 2020-06-14 | Discharge: 2020-06-14 | Disposition: A | Discharge status: Home / Self Care | Payer: Medicare PPO | Source: Ambulatory Visit | Attending: Gastroenterology | Admitting: Gastroenterology

## 2020-06-14 DIAGNOSIS — Z01812 Encounter for preprocedural laboratory examination: Secondary | ICD-10-CM | POA: Diagnosis not present

## 2020-06-14 DIAGNOSIS — Z20822 Contact with and (suspected) exposure to covid-19: Secondary | ICD-10-CM | POA: Insufficient documentation

## 2020-06-14 LAB — SARS CORONAVIRUS 2 (TAT 6-24 HRS): SARS Coronavirus 2: NEGATIVE

## 2020-06-17 ENCOUNTER — Ambulatory Visit (HOSPITAL_COMMUNITY): Payer: Medicare PPO | Admitting: Certified Registered"

## 2020-06-17 ENCOUNTER — Encounter (HOSPITAL_COMMUNITY)
Admission: RE | Disposition: A | Discharge status: Home / Self Care | Payer: Self-pay | Source: Home / Self Care | Attending: Gastroenterology

## 2020-06-17 ENCOUNTER — Other Ambulatory Visit: Payer: Self-pay

## 2020-06-17 ENCOUNTER — Encounter (HOSPITAL_COMMUNITY): Payer: Self-pay | Admitting: Gastroenterology

## 2020-06-17 ENCOUNTER — Ambulatory Visit (HOSPITAL_COMMUNITY)
Admission: RE | Admit: 2020-06-17 | Discharge: 2020-06-17 | Disposition: A | Discharge status: Home / Self Care | Payer: Medicare PPO | Attending: Gastroenterology | Admitting: Gastroenterology

## 2020-06-17 DIAGNOSIS — R1310 Dysphagia, unspecified: Secondary | ICD-10-CM | POA: Diagnosis not present

## 2020-06-17 DIAGNOSIS — Z8673 Personal history of transient ischemic attack (TIA), and cerebral infarction without residual deficits: Secondary | ICD-10-CM | POA: Diagnosis not present

## 2020-06-17 DIAGNOSIS — I509 Heart failure, unspecified: Secondary | ICD-10-CM | POA: Insufficient documentation

## 2020-06-17 DIAGNOSIS — M170 Bilateral primary osteoarthritis of knee: Secondary | ICD-10-CM | POA: Diagnosis not present

## 2020-06-17 DIAGNOSIS — N189 Chronic kidney disease, unspecified: Secondary | ICD-10-CM | POA: Insufficient documentation

## 2020-06-17 DIAGNOSIS — I130 Hypertensive heart and chronic kidney disease with heart failure and stage 1 through stage 4 chronic kidney disease, or unspecified chronic kidney disease: Secondary | ICD-10-CM | POA: Diagnosis not present

## 2020-06-17 DIAGNOSIS — Z880 Allergy status to penicillin: Secondary | ICD-10-CM | POA: Diagnosis not present

## 2020-06-17 DIAGNOSIS — K31A22 Gastric intestinal metaplasia with high grade dysplasia: Secondary | ICD-10-CM | POA: Insufficient documentation

## 2020-06-17 DIAGNOSIS — Z8719 Personal history of other diseases of the digestive system: Secondary | ICD-10-CM | POA: Diagnosis not present

## 2020-06-17 DIAGNOSIS — Z885 Allergy status to narcotic agent status: Secondary | ICD-10-CM | POA: Diagnosis not present

## 2020-06-17 DIAGNOSIS — Z888 Allergy status to other drugs, medicaments and biological substances status: Secondary | ICD-10-CM | POA: Diagnosis not present

## 2020-06-17 DIAGNOSIS — K219 Gastro-esophageal reflux disease without esophagitis: Secondary | ICD-10-CM | POA: Diagnosis not present

## 2020-06-17 DIAGNOSIS — Z96659 Presence of unspecified artificial knee joint: Secondary | ICD-10-CM | POA: Insufficient documentation

## 2020-06-17 DIAGNOSIS — Z90710 Acquired absence of both cervix and uterus: Secondary | ICD-10-CM | POA: Insufficient documentation

## 2020-06-17 DIAGNOSIS — I129 Hypertensive chronic kidney disease with stage 1 through stage 4 chronic kidney disease, or unspecified chronic kidney disease: Secondary | ICD-10-CM | POA: Diagnosis not present

## 2020-06-17 DIAGNOSIS — K449 Diaphragmatic hernia without obstruction or gangrene: Secondary | ICD-10-CM | POA: Diagnosis not present

## 2020-06-17 DIAGNOSIS — B3781 Candidal esophagitis: Secondary | ICD-10-CM | POA: Diagnosis not present

## 2020-06-17 DIAGNOSIS — E785 Hyperlipidemia, unspecified: Secondary | ICD-10-CM | POA: Diagnosis not present

## 2020-06-17 DIAGNOSIS — K317 Polyp of stomach and duodenum: Secondary | ICD-10-CM | POA: Diagnosis not present

## 2020-06-17 DIAGNOSIS — E039 Hypothyroidism, unspecified: Secondary | ICD-10-CM | POA: Diagnosis not present

## 2020-06-17 DIAGNOSIS — E1140 Type 2 diabetes mellitus with diabetic neuropathy, unspecified: Secondary | ICD-10-CM | POA: Diagnosis not present

## 2020-06-17 DIAGNOSIS — K222 Esophageal obstruction: Secondary | ICD-10-CM | POA: Diagnosis not present

## 2020-06-17 DIAGNOSIS — F331 Major depressive disorder, recurrent, moderate: Secondary | ICD-10-CM | POA: Diagnosis not present

## 2020-06-17 DIAGNOSIS — E1121 Type 2 diabetes mellitus with diabetic nephropathy: Secondary | ICD-10-CM | POA: Diagnosis not present

## 2020-06-17 DIAGNOSIS — I2510 Atherosclerotic heart disease of native coronary artery without angina pectoris: Secondary | ICD-10-CM | POA: Insufficient documentation

## 2020-06-17 DIAGNOSIS — E1122 Type 2 diabetes mellitus with diabetic chronic kidney disease: Secondary | ICD-10-CM | POA: Diagnosis not present

## 2020-06-17 HISTORY — PX: BIOPSY: SHX5522

## 2020-06-17 HISTORY — PX: ESOPHAGOGASTRODUODENOSCOPY (EGD) WITH PROPOFOL: SHX5813

## 2020-06-17 HISTORY — PX: ESOPHAGEAL DILATION: SHX303

## 2020-06-17 LAB — GLUCOSE, CAPILLARY: Glucose-Capillary: 118 mg/dL — ABNORMAL HIGH (ref 70–99)

## 2020-06-17 SURGERY — ESOPHAGOGASTRODUODENOSCOPY (EGD) WITH PROPOFOL
Anesthesia: Monitor Anesthesia Care

## 2020-06-17 MED ORDER — SODIUM CHLORIDE 0.9 % IV SOLN: INTRAVENOUS | Status: DC

## 2020-06-17 MED ORDER — LACTATED RINGERS IV SOLN: INTRAVENOUS | Status: DC

## 2020-06-17 MED ORDER — PROPOFOL 10 MG/ML IV BOLUS
INTRAVENOUS | Status: DC | PRN
  Administered 2020-06-17: 40 mg via INTRAVENOUS
  Administered 2020-06-17 (×3): 30 mg via INTRAVENOUS
  Administered 2020-06-17: 20 mg via INTRAVENOUS
  Administered 2020-06-17: 30 mg via INTRAVENOUS
  Administered 2020-06-17: 20 mg via INTRAVENOUS

## 2020-06-17 MED ORDER — LIDOCAINE 2% (20 MG/ML) 5 ML SYRINGE
INTRAMUSCULAR | Status: DC | PRN
  Administered 2020-06-17: 60 mg via INTRAVENOUS

## 2020-06-17 SURGICAL SUPPLY — 15 items

## 2020-06-17 NOTE — Discharge Instructions (Signed)

## 2020-06-17 NOTE — Op Note (Signed)
[facility] Patient Name: [name] Procedure Date: 06/17/2020 MRN: [id number] Attending MD: Carol Ada , MD Date of Birth: 1945-08-15 CSN: [id number] Age: 75 Admit Type: Outpatient Procedure:                Upper GI endoscopy Indications:              Dysphagia Providers:                Carol Ada, MD, Truddie Coco, RN, Cherylynn Ridges,                            Technician, Caryl Pina CRNA Referring MD:              Medicines:                General Anesthesia Complications:            No immediate complications. Estimated Blood Loss:     Estimated blood loss was minimal. Procedure:                Pre-Anesthesia Assessment:                           - Prior to the procedure, a History and Physical                            was performed, and patient medications and                            allergies were reviewed. The patient's tolerance of                            previous anesthesia was also reviewed. The risks                            and benefits of the procedure and the sedation                            options and risks were discussed with the patient.                            All questions were answered, and informed consent                            was obtained. Prior Anticoagulants: The patient has                            taken no previous anticoagulant or antiplatelet                            agents. ASA Grade Assessment: II - A patient with                            mild systemic disease. After reviewing the risks  and benefits, the patient was deemed in                            satisfactory condition to undergo the procedure.                           - Sedation was administered by an anesthesia                            professional. Deep sedation was attained.                           After obtaining informed consent, the endoscope was                            passed under direct vision.  Throughout the                            procedure, the patient's blood pressure, pulse, and                            oxygen saturations were monitored continuously. The                            [id number]-H190 ([id number]) Olympus gastroscope was                            introduced through the mouth, and advanced to the                            second part of duodenum. The upper GI endoscopy was                            accomplished without difficulty. The patient                            tolerated the procedure well. Scope In: Scope Out: Findings:      One benign-appearing, intrinsic mild stenosis was found at the       cricopharyngeus. This stenosis measured 1.6 cm (inner diameter) x 1 cm       (in length). The stenosis was traversed. A guidewire was placed and the       scope was withdrawn. Dilation was performed with a Savary dilator with       no resistance at 18 mm. The dilation site was examined following       endoscope reinsertion and showed complete resolution of luminal       narrowing. Estimated blood loss was minimal.      Patchy, yellow plaques were found in the upper third of the esophagus.      A 3 cm hiatal hernia was present.      Three 12 mm pedunculated and sessile polyps with no bleeding and no       stigmata of recent bleeding were found in the gastric antrum. Biopsies       were taken with a cold forceps for histology.  The examined duodenum was normal.      It is unclear if the antral polyp/lesion is inflammatory or possibly       malignant. Two cold biopsies were obtained. Impression:               - Benign-appearing esophageal stenosis. Dilated.                           - Esophageal plaques were found, suspicious for                            candidiasis.                           - 3 cm hiatal hernia.                           - Three gastric polyps. Biopsied.                           - Normal examined duodenum. Moderate Sedation:      Not  Applicable - Patient had care per Anesthesia. Recommendation:           - Patient has a contact number available for                            emergencies. The signs and symptoms of potential                            delayed complications were discussed with the                            patient. Return to normal activities tomorrow.                            Written discharge instructions were provided to the                            patient.                           - Resume regular diet.                           - Continue present medications.                           - Await pathology results.                           - Repeat upper endoscopy pending the results of the                            antral biopsies.                           - Fluconazole 200 mg x 10 days. Procedure Code(s):        ---  Professional ---                           [phone], Esophagogastroduodenoscopy, flexible,                            transoral; with insertion of guide wire followed by                            passage of dilator(s) through esophagus over guide                            wire                           43239, 1, Esophagogastroduodenoscopy, flexible,                            transoral; with biopsy, single or multiple Diagnosis Code(s):        --- Professional ---                           K22.2, Esophageal obstruction                           K22.9, Disease of esophagus, unspecified                           K44.9, Diaphragmatic hernia without obstruction or                            gangrene                           K31.7, Polyp of stomach and duodenum                           R13.10, Dysphagia, unspecified CPT copyright 2019 American Medical Association. All rights reserved. The codes documented in this report are preliminary and upon coder review may  be revised to meet current compliance requirements. Carol Ada, MD Carol Ada, MD 06/17/2020 12:01:53 PM This  report has been signed electronically. Number of Addenda: 0

## 2020-06-17 NOTE — Transfer of Care (Signed)
Immediate Anesthesia Transfer of Care Note  Patient: [name]  Procedure(s) Performed: ESOPHAGOGASTRODUODENOSCOPY (EGD) WITH PROPOFOL (N/A ) ESOPHAGEAL DILATION BIOPSY  Patient Location: PACU and Endoscopy Unit  Anesthesia Type:MAC  Level of Consciousness: awake and alert   Airway & Oxygen Therapy: Patient Spontanous Breathing and Patient connected to face mask oxygen  Post-op Assessment: Report given to RN and Post -op Vital signs reviewed and stable  Post vital signs: Reviewed and stable  Last Vitals:  Vitals Value Taken Time  BP    Temp    Pulse    Resp    SpO2      Last Pain:  Vitals:   06/17/20 1048  TempSrc: Oral  PainSc: 0-No pain         Complications: No complications documented.

## 2020-06-17 NOTE — H&P (Signed)
[name]   HPI: This 75 year old white female presents to the office for further evaluation difficulty swallowing solids and liquids which started 6 months ago. She is taking Omeprazole 40 mg but her PCP has sent in an order for Pantoprazole instead which she has not picked up yet. She denies any NSAID use. She has 1 BM every 3-4 day with no obvious blood or mucus in the stool. She has occasional nausea. She has a good appetite and has gained 19 pounds in the last 10 years. She denies having any complaints of abdominal pain, vomiting or odynophagia. She denies having a family history of colon cancer, celiac sprue or IBD. Her last colonoscopy was done on 09/28/2009 which revealed scattered diverticulosis in the sigmoid colon but the exam was otherwise normal. In 2007 a hyperplastic polyp was removed.    Past Medical History:  Diagnosis Date  . Anxiety   . CAD (coronary artery disease)     (Nonobstructive coronary artery disease 40% LAD stenosis)  2011  . CHF (congestive heart failure) ([place])   . Chronic kidney disease    STAGE 4 KIDNEY DISEASE  . Depression   . DJD (degenerative joint disease)   . DM (diabetes mellitus) ([place])   . GERD (gastroesophageal reflux disease)   . H/O hiatal hernia   . HTN (hypertension)   . Hypothyroidism   . OSA (obstructive sleep apnea)    doesn't wear CPAP  . Stroke [facility])     Past Surgical History:  Procedure Laterality Date  . ABDOMINAL HYSTERECTOMY    . CARPAL TUNNEL RELEASE    . CERVICAL DISC SURGERY    . REPLACEMENT TOTAL KNEE    . SALPINGOOPHORECTOMY    . UVULOPALATOPHARYNGOPLASTY      Family History  Problem Relation Age of Onset  . Breast cancer Mother   . Breast cancer Sister     Social History:  reports that she has never smoked. She has never used smokeless tobacco. She reports that she does not drink alcohol and does not use drugs.  Allergies:  Allergies  Allergen Reactions  . Codeine Itching  . Meperidine Hcl Itching   . Morphine Itching  . Nylon Other (See Comments)    Stitches cause infection  . Penicillins Obrien, Itching and Rash    Has patient had a PCN reaction causing immediate rash, facial/tongue/throat swelling, SOB or lightheadedness with hypotension: Yes Has patient had a PCN reaction causing severe rash involving mucus membranes or skin necrosis: Yes Has patient had a PCN reaction that required hospitalization: No Has patient had a PCN reaction occurring within the last 10 years: No If all of the above answers are "NO", then may proceed with Cephalosporin use.     Medications:  Scheduled:  Continuous: . sodium chloride    . lactated ringers      Results for orders placed or performed during the hospital encounter of 06/17/20 (from the past 24 hour(s))  Glucose, capillary     Status: Abnormal   Collection Time: 06/17/20 11:05 AM  Result Value Ref Range   Glucose-Capillary 118 (H) 70 - 99 mg/dL     No results found.  ROS:  As stated above in the HPI otherwise negative.  Blood pressure (!) 169/67, pulse 71, temperature 98.2 F (36.8 C), temperature source Oral, resp. rate 18, height 4' 11.5" (1.511 m), weight 95.3 kg, SpO2 96 %.    PE: Gen: NAD, Alert and Oriented HEENT:  [place]/AT, EOMI Neck: Supple,  no LAD Lungs: CTA Bilaterally CV: RRR without M/G/R ABD: Soft, NTND, +BS Ext: No C/C/E  Assessment/Plan: 1) Dysphagia - EGD with dilation.  Kolleen Ochsner D 06/17/2020, 11:18 AM

## 2020-06-17 NOTE — Anesthesia Preprocedure Evaluation (Addendum)
Anesthesia Evaluation  Patient identified by MRN, date of birth, ID band Patient awake    Reviewed: Allergy & Precautions, NPO status , Patient's Chart, lab work & pertinent test results, reviewed documented beta blocker date and time   Airway Mallampati: II  TM Distance: >3 FB Neck ROM: Full    Dental  (+) Dental Advisory Given, Edentulous Upper, Edentulous Lower   Pulmonary sleep apnea ,    Pulmonary exam normal breath sounds clear to auscultation       Cardiovascular hypertension, Pt. on medications and Pt. on home beta blockers + CAD and +CHF  Normal cardiovascular exam Rhythm:Regular Rate:Normal     Neuro/Psych Seizures -,  PSYCHIATRIC DISORDERS Anxiety Depression TIA   GI/Hepatic Neg liver ROS, hiatal hernia, GERD  Medicated,dysphagia/abnormal barium swallow   Endo/Other  diabetes, Type 2, Oral Hypoglycemic AgentsHypothyroidism Morbid obesity  Renal/GU Renal InsufficiencyRenal disease     Musculoskeletal  (+) Arthritis ,   Abdominal   Peds  Hematology negative hematology ROS (+)   Anesthesia Other Findings Day of surgery medications reviewed with the patient.  Reproductive/Obstetrics                            Anesthesia Physical Anesthesia Plan  ASA: III  Anesthesia Plan: MAC   Post-op Pain Management:    Induction: Intravenous  PONV Risk Score and Plan: 2 and Propofol infusion and Treatment may vary due to age or medical condition  Airway Management Planned: Nasal Cannula and Natural Airway  Additional Equipment:   Intra-op Plan:   Post-operative Plan:   Informed Consent: I have reviewed the patients History and Physical, chart, labs and discussed the procedure including the risks, benefits and alternatives for the proposed anesthesia with the patient or authorized representative who has indicated his/her understanding and acceptance.     Dental advisory  given  Plan Discussed with: CRNA and Anesthesiologist  Anesthesia Plan Comments:         Anesthesia Quick Evaluation

## 2020-06-19 NOTE — Anesthesia Postprocedure Evaluation (Signed)
Anesthesia Post Note  Patient: [name]  Procedure(s) Performed: ESOPHAGOGASTRODUODENOSCOPY (EGD) WITH PROPOFOL (N/A ) ESOPHAGEAL DILATION BIOPSY     Patient location during evaluation: Endoscopy Anesthesia Type: MAC Level of consciousness: awake and alert Pain management: pain level controlled Vital Signs Assessment: post-procedure vital signs reviewed and stable Respiratory status: spontaneous breathing, nonlabored ventilation, respiratory function stable and patient connected to nasal cannula oxygen Cardiovascular status: stable and blood pressure returned to baseline Postop Assessment: no apparent nausea or vomiting Anesthetic complications: no   No complications documented.  Last Vitals:  Vitals:   06/17/20 1220 06/17/20 1231  BP:  (!) 185/64  Pulse: (!) 59 66  Resp: 16 20  Temp:    SpO2: 100% 93%    Last Pain:  Vitals:   06/17/20 1231  TempSrc:   PainSc: 0-No pain                 Catalina Gravel

## 2020-06-20 LAB — SURGICAL PATHOLOGY

## 2020-06-21 ENCOUNTER — Encounter (HOSPITAL_COMMUNITY): Payer: Self-pay | Admitting: Gastroenterology

## 2020-06-27 ENCOUNTER — Other Ambulatory Visit: Payer: Self-pay | Admitting: Gastroenterology

## 2020-06-28 ENCOUNTER — Other Ambulatory Visit (HOSPITAL_COMMUNITY)
Admission: RE | Admit: 2020-06-28 | Discharge: 2020-06-28 | Disposition: A | Discharge status: Home / Self Care | Payer: Medicare PPO | Source: Ambulatory Visit | Attending: Gastroenterology | Admitting: Gastroenterology

## 2020-06-28 DIAGNOSIS — Z01812 Encounter for preprocedural laboratory examination: Secondary | ICD-10-CM | POA: Diagnosis not present

## 2020-06-28 DIAGNOSIS — Z20822 Contact with and (suspected) exposure to covid-19: Secondary | ICD-10-CM | POA: Diagnosis not present

## 2020-06-28 LAB — SARS CORONAVIRUS 2 (TAT 6-24 HRS): SARS Coronavirus 2: NEGATIVE

## 2020-06-29 ENCOUNTER — Encounter (HOSPITAL_COMMUNITY): Payer: Self-pay | Admitting: Gastroenterology

## 2020-06-29 ENCOUNTER — Other Ambulatory Visit: Payer: Self-pay

## 2020-06-29 DIAGNOSIS — F331 Major depressive disorder, recurrent, moderate: Secondary | ICD-10-CM | POA: Diagnosis not present

## 2020-06-30 NOTE — Anesthesia Preprocedure Evaluation (Addendum)
Anesthesia Evaluation  Patient identified by MRN, date of birth, ID band Patient awake    Reviewed: Allergy & Precautions, NPO status , Patient's Chart, lab work & pertinent test results  History of Anesthesia Complications Negative for: history of anesthetic complications  Airway Mallampati: II  TM Distance: >3 FB Neck ROM: Full    Dental  (+) Dental Advisory Given, Edentulous Upper   Pulmonary sleep apnea ,    Pulmonary exam normal        Cardiovascular hypertension, Pt. on medications and Pt. on home beta blockers + CAD  Normal cardiovascular exam     Neuro/Psych Seizures -, Well Controlled,  PSYCHIATRIC DISORDERS Anxiety Depression TIACVA, No Residual Symptoms    GI/Hepatic Neg liver ROS, hiatal hernia, GERD  Medicated and Controlled,  Endo/Other  diabetes, Type 2, Oral Hypoglycemic AgentsHypothyroidism Morbid obesity  Renal/GU CRFRenal disease     Musculoskeletal  (+) Arthritis ,   Abdominal   Peds  Hematology negative hematology ROS (+)   Anesthesia Other Findings Covid test negative   Reproductive/Obstetrics                            Anesthesia Physical Anesthesia Plan  ASA: III  Anesthesia Plan: MAC   Post-op Pain Management:    Induction: Intravenous  PONV Risk Score and Plan: 2 and Propofol infusion and Treatment may vary due to age or medical condition  Airway Management Planned: Nasal Cannula and Natural Airway  Additional Equipment: None  Intra-op Plan:   Post-operative Plan:   Informed Consent: I have reviewed the patients History and Physical, chart, labs and discussed the procedure including the risks, benefits and alternatives for the proposed anesthesia with the patient or authorized representative who has indicated his/her understanding and acceptance.       Plan Discussed with: CRNA and Anesthesiologist  Anesthesia Plan Comments:         Anesthesia Quick Evaluation

## 2020-07-01 ENCOUNTER — Other Ambulatory Visit: Payer: Self-pay

## 2020-07-01 ENCOUNTER — Ambulatory Visit (HOSPITAL_COMMUNITY): Payer: Medicare PPO | Admitting: Anesthesiology

## 2020-07-01 ENCOUNTER — Ambulatory Visit (HOSPITAL_COMMUNITY)
Admission: RE | Admit: 2020-07-01 | Discharge: 2020-07-01 | Disposition: A | Discharge status: Home / Self Care | Payer: Medicare PPO | Attending: Gastroenterology | Admitting: Gastroenterology

## 2020-07-01 ENCOUNTER — Encounter (HOSPITAL_COMMUNITY)
Admission: RE | Disposition: A | Discharge status: Home / Self Care | Payer: Self-pay | Source: Home / Self Care | Attending: Gastroenterology

## 2020-07-01 DIAGNOSIS — Z8673 Personal history of transient ischemic attack (TIA), and cerebral infarction without residual deficits: Secondary | ICD-10-CM | POA: Diagnosis not present

## 2020-07-01 DIAGNOSIS — Z880 Allergy status to penicillin: Secondary | ICD-10-CM | POA: Diagnosis not present

## 2020-07-01 DIAGNOSIS — K449 Diaphragmatic hernia without obstruction or gangrene: Secondary | ICD-10-CM | POA: Insufficient documentation

## 2020-07-01 DIAGNOSIS — F418 Other specified anxiety disorders: Secondary | ICD-10-CM | POA: Diagnosis not present

## 2020-07-01 DIAGNOSIS — K2950 Unspecified chronic gastritis without bleeding: Secondary | ICD-10-CM | POA: Insufficient documentation

## 2020-07-01 DIAGNOSIS — C163 Malignant neoplasm of pyloric antrum: Secondary | ICD-10-CM | POA: Insufficient documentation

## 2020-07-01 DIAGNOSIS — Z79899 Other long term (current) drug therapy: Secondary | ICD-10-CM | POA: Diagnosis not present

## 2020-07-01 DIAGNOSIS — Z9109 Other allergy status, other than to drugs and biological substances: Secondary | ICD-10-CM | POA: Insufficient documentation

## 2020-07-01 DIAGNOSIS — K317 Polyp of stomach and duodenum: Secondary | ICD-10-CM | POA: Diagnosis not present

## 2020-07-01 DIAGNOSIS — K31A22 Gastric intestinal metaplasia with high grade dysplasia: Secondary | ICD-10-CM | POA: Diagnosis not present

## 2020-07-01 DIAGNOSIS — K31A0 Gastric intestinal metaplasia, unspecified: Secondary | ICD-10-CM | POA: Diagnosis not present

## 2020-07-01 DIAGNOSIS — Z885 Allergy status to narcotic agent status: Secondary | ICD-10-CM | POA: Insufficient documentation

## 2020-07-01 DIAGNOSIS — E785 Hyperlipidemia, unspecified: Secondary | ICD-10-CM | POA: Diagnosis not present

## 2020-07-01 HISTORY — PX: HEMOSTASIS CLIP PLACEMENT: SHX6857

## 2020-07-01 HISTORY — PX: SUBMUCOSAL INJECTION: SHX5543

## 2020-07-01 HISTORY — PX: ESOPHAGOGASTRODUODENOSCOPY (EGD) WITH PROPOFOL: SHX5813

## 2020-07-01 HISTORY — PX: BIOPSY: SHX5522

## 2020-07-01 HISTORY — PX: POLYPECTOMY: SHX5525

## 2020-07-01 LAB — GLUCOSE, CAPILLARY: Glucose-Capillary: 132 mg/dL — ABNORMAL HIGH (ref 70–99)

## 2020-07-01 SURGERY — ESOPHAGOGASTRODUODENOSCOPY (EGD) WITH PROPOFOL
Anesthesia: Monitor Anesthesia Care

## 2020-07-01 MED ORDER — LIDOCAINE 2% (20 MG/ML) 5 ML SYRINGE
INTRAMUSCULAR | Status: DC | PRN
  Administered 2020-07-01: 80 mg via INTRAVENOUS

## 2020-07-01 MED ORDER — SODIUM CHLORIDE (PF) 0.9 % IJ SOLN
PREFILLED_SYRINGE | INTRAMUSCULAR | Status: DC | PRN
  Administered 2020-07-01: 1 mL

## 2020-07-01 MED ORDER — LACTATED RINGERS IV SOLN: INTRAVENOUS | Status: DC

## 2020-07-01 MED ORDER — ONDANSETRON HCL 4 MG/2ML IJ SOLN
INTRAMUSCULAR | Status: DC | PRN
  Administered 2020-07-01: 4 mg via INTRAVENOUS

## 2020-07-01 MED ORDER — PROPOFOL 10 MG/ML IV BOLUS
INTRAVENOUS | Status: DC | PRN
  Administered 2020-07-01: 20 mg via INTRAVENOUS

## 2020-07-01 MED ORDER — SODIUM CHLORIDE 0.9 % IV SOLN: INTRAVENOUS | Status: DC

## 2020-07-01 MED ORDER — PROPOFOL 500 MG/50ML IV EMUL
INTRAVENOUS | Status: DC | PRN
  Administered 2020-07-01: 100 ug/kg/min via INTRAVENOUS

## 2020-07-01 MED ORDER — EPHEDRINE SULFATE-NACL 50-0.9 MG/10ML-% IV SOSY
PREFILLED_SYRINGE | INTRAVENOUS | Status: DC | PRN
  Administered 2020-07-01: 10 mg via INTRAVENOUS

## 2020-07-01 SURGICAL SUPPLY — 15 items

## 2020-07-01 NOTE — Anesthesia Postprocedure Evaluation (Signed)
Anesthesia Post Note  Patient: [name]  Procedure(s) Performed: ESOPHAGOGASTRODUODENOSCOPY (EGD) WITH PROPOFOL (N/A ) POLYPECTOMY HEMOSTASIS CLIP PLACEMENT BIOPSY SUBMUCOSAL INJECTION     Patient location during evaluation: PACU Anesthesia Type: MAC Level of consciousness: awake and alert Pain management: pain level controlled Vital Signs Assessment: post-procedure vital signs reviewed and stable Respiratory status: spontaneous breathing, nonlabored ventilation and respiratory function stable Cardiovascular status: stable and blood pressure returned to baseline Anesthetic complications: no   No complications documented.  Last Vitals:  Vitals:   07/01/20 0910 07/01/20 0920  BP: (!) 165/85 94/71  Pulse: 85 79  Resp: 13 18  Temp: 36.5 C   SpO2: 100% 95%    Last Pain:  Vitals:   07/01/20 0930  TempSrc:   PainSc: [place]

## 2020-07-01 NOTE — Anesthesia Procedure Notes (Addendum)
Procedure Name: MAC Date/Time: 07/01/2020 8:14 AM Performed by: West Pugh, CRNA Pre-anesthesia Checklist: Patient identified, Emergency Drugs available, Suction available, Patient being monitored and Timeout performed Patient Re-evaluated:Patient Re-evaluated prior to induction Oxygen Delivery Method: Simple face mask Placement Confirmation: positive ETCO2 Dental Injury: Teeth and Oropharynx as per pre-operative assessment

## 2020-07-01 NOTE — H&P (Signed)
  Mee Hives HPI: The patient is here for polypectomy in the gastric antrum.  The prior procedure showed that the polyp had HGD.  Past Medical History:  Diagnosis Date  . Anxiety   . CAD (coronary artery disease)     (Nonobstructive coronary artery disease 40% LAD stenosis)  2011  . CHF (congestive heart failure) ([place])   . Chronic kidney disease    STAGE 4 KIDNEY DISEASE  . Depression   . DJD (degenerative joint disease)   . DM (diabetes mellitus) ([place])   . GERD (gastroesophageal reflux disease)   . H/O hiatal hernia   . HTN (hypertension)   . Hypothyroidism   . OSA (obstructive sleep apnea)    doesn't wear CPAP  . Stroke [facility])     Past Surgical History:  Procedure Laterality Date  . ABDOMINAL HYSTERECTOMY    . BIOPSY  06/17/2020   Procedure: BIOPSY;  Surgeon: Carol Ada, MD;  Location: [facility] ENDOSCOPY;  Service: Endoscopy;;  . CARPAL TUNNEL RELEASE    . CERVICAL DISC SURGERY    . ESOPHAGEAL DILATION  06/17/2020   Procedure: ESOPHAGEAL DILATION;  Surgeon: Carol Ada, MD;  Location: [facility] ENDOSCOPY;  Service: Endoscopy;;  . ESOPHAGOGASTRODUODENOSCOPY (EGD) WITH PROPOFOL N/A 06/17/2020   Procedure: ESOPHAGOGASTRODUODENOSCOPY (EGD) WITH PROPOFOL;  Surgeon: Carol Ada, MD;  Location: [facility] ENDOS[facility];  Service: Endoscopy;  Laterality: N/A;  . REPLACEMENT TOTAL KNEE    . SALPINGOOPHORECTOMY    . UVULOPALATOPHARYNGOPLASTY      Family History  Problem Relation Age of Onset  . Breast cancer Mother   . Breast cancer Sister     Social History:  reports that she has never smoked. She has never used smokeless tobacco. She reports that she does not drink alcohol and does not use drugs.  Allergies:  Allergies  Allergen Reactions  . Codeine Itching  . Meperidine Hcl Itching  . Morphine Itching  . Nylon Other (See Comments)    Stitches cause infection  . Penicillins Hives, Itching and Rash    Has patient had a PCN reaction causing immediate rash, facial/tongue/throat  swelling, SOB or lightheadedness with hypotension: Yes Has patient had a PCN reaction causing severe rash involving mucus membranes or skin necrosis: Yes Has patient had a PCN reaction that required hospitalization: No Has patient had a PCN reaction occurring within the last 10 years: No If all of the above answers are "NO", then may proceed with Cephalosporin use.     Medications:  Scheduled:  Continuous: . lactated ringers 10 mL/hr at 07/01/20 0754    Results for orders placed or performed during the hospital encounter of 07/01/20 (from the past 24 hour(s))  Glucose, capillary     Status: Abnormal   Collection Time: 07/01/20  7:48 AM  Result Value Ref Range   Glucose-Capillary 132 (H) 70 - 99 mg/dL     No results found.  ROS:  As stated above in the HPI otherwise negative.  Blood pressure (!) 160/52, pulse 66, temperature 98.4 F (36.9 C), temperature source Oral, resp. rate 19, height 4\' 11"  (1.499 m), weight 95.3 kg, SpO2 97 %.    PE: Gen: NAD, Alert and Oriented HEENT:  /AT, EOMI Neck: Supple, no LAD Lungs: CTA Bilaterally CV: RRR without M/G/R ABD: Soft, NTND, +BS Ext: No C/C/E  Assessment/Plan: 1) HGD antral polyp - EGD with polypectomy.  Aalani Aikens D 07/01/2020, 8:08 AM

## 2020-07-01 NOTE — Progress Notes (Signed)
Pt c/o of nausea post procedure.  4 mg zofran given by Christell Faith, crna

## 2020-07-01 NOTE — Op Note (Signed)
[facility] Patient Name: [name] Procedure Date: 07/01/2020 MRN: [id number] Attending MD: Carol Ada , MD Date of Birth: 10/22/1945 CSN: [id number] Age: 75 Admit Type: Outpatient Procedure:                Upper GI endoscopy Indications:              Therapeutic procedure Providers:                Carol Ada, MD, Jeanella Cara, RN, Ladona Ridgel, Technician, Christell Faith, CRNA Referring MD:              Medicines:                Propofol per Anesthesia Complications:            No immediate complications. Estimated Blood Loss:     Estimated blood loss was minimal. Procedure:                Pre-Anesthesia Assessment:                           - Prior to the procedure, a History and Physical                            was performed, and patient medications and                            allergies were reviewed. The patient's tolerance of                            previous anesthesia was also reviewed. The risks                            and benefits of the procedure and the sedation                            options and risks were discussed with the patient.                            All questions were answered, and informed consent                            was obtained. Prior Anticoagulants: The patient has                            taken no previous anticoagulant or antiplatelet                            agents. ASA Grade Assessment: III - A patient with                            severe systemic disease. After reviewing the risks  and benefits, the patient was deemed in                            satisfactory condition to undergo the procedure.                           - Sedation was administered by an anesthesia                            professional. Deep sedation was attained.                           After obtaining informed consent, the endoscope was                             passed under direct vision. Throughout the                            procedure, the patient's blood pressure, pulse, and                            oxygen saturations were monitored continuously. The                            GIF-H190 ([id number] Olympus gastroscope was                            introduced through the mouth, and advanced to the                            second part of duodenum. The upper GI endoscopy was                            technically difficult and complex. The patient                            tolerated the procedure well. Scope In: Scope Out: Findings:      A 3 cm hiatal hernia was present.      Two 12 mm sessile polyps with no bleeding and no stigmata of recent       bleeding were found in the gastric antrum. The polyp was removed with a       hot snare. The polyp was removed with a saline injection-lift technique       using a hot snare. The polyp was removed with a piecemeal technique       using a hot snare. Resection and retrieval were complete. To stop active       bleeding, nine hemostatic clips were successfully placed (MR       conditional). There was no bleeding at the end of the procedure. Area       was successfully injected with 2 mL of a 1:10,000 solution of       epinephrine for hemostasis.      The examined duodenum was normal.      The polyp was again identified in the distal antrum. Two to 3 ml of  O'rise was injected and this lifted the lesion. It was removed in a       piecemeal fashion using a combination of the 13 mm and 10 mm hot snares.       During one of the passess mild arterial bleeding occurred. Two ml of       1:10,000 Epi was injected and this arrested the bleeding. The       polypectomy site was large and deep, down to the muscular layer. A total       of 9 hemoclips were deployed to close the defect. A secondary smaller       antral polyp was again noted, but not removed as a result of the       extensive treatment  required for the polyp. It is not clear, but all the       images obtained did not capture. Impression:               - 3 cm hiatal hernia.                           - Two gastric polyps. Resected and retrieved. Clips                            (MR conditional) were placed. Injected.                           - Normal examined duodenum. Moderate Sedation:      Not Applicable - Patient had care per Anesthesia. Recommendation:           - Patient has a contact number available for                            emergencies. The signs and symptoms of potential                            delayed complications were discussed with the                            patient. Return to normal activities tomorrow.                            Written discharge instructions were provided to the                            patient.                           - Resume previous diet.                           - Continue present medications.                           - Await pathology results. It is likely that the                            patient will need to have surgical resection. Procedure Code(s):        ---  Professional ---                           [phone], 59, Esophagogastroduodenoscopy, flexible,                            transoral; with control of bleeding, any method                           43251, Esophagogastroduodenoscopy, flexible,                            transoral; with removal of tumor(s), polyp(s), or                            other lesion(s) by snare technique                           43236, 59, Esophagogastroduodenoscopy, flexible,                            transoral; with directed submucosal injection(s),                            any substance Diagnosis Code(s):        --- Professional ---                           K31.7, Polyp of stomach and duodenum                           K44.9, Diaphragmatic hernia without obstruction or                            gangrene CPT copyright  2019 American Medical Association. All rights reserved. The codes documented in this report are preliminary and upon coder review may  be revised to meet current compliance requirements. Carol Ada, MD Carol Ada, MD 07/01/2020 9:19:59 AM This report has been signed electronically. Number of Addenda: 0

## 2020-07-01 NOTE — Transfer of Care (Signed)
Immediate Anesthesia Transfer of Care Note  Patient: [name]  Procedure(s) Performed: ESOPHAGOGASTRODUODENOSCOPY (EGD) WITH PROPOFOL (N/A ) POLYPECTOMY HEMOSTASIS CLIP PLACEMENT BIOPSY  Patient Location: PACU  Anesthesia Type:MAC  Level of Consciousness: awake, alert  and patient cooperative  Airway & Oxygen Therapy: Patient Spontanous Breathing and Patient connected to face mask oxygen  Post-op Assessment: Report given to RN and Post -op Vital signs reviewed and stable  Post vital signs: Reviewed and stable  Last Vitals:  Vitals Value Taken Time  BP 165/85 07/01/20 0910  Temp    Pulse 84 07/01/20 0911  Resp 23 07/01/20 0911  SpO2 100 % 07/01/20 0911  Vitals shown include unvalidated device data.  Last Pain:  Vitals:   07/01/20 0746  TempSrc: Oral  PainSc: 0-No pain         Complications: No complications documented.

## 2020-07-01 NOTE — Discharge Instructions (Signed)

## 2020-07-04 ENCOUNTER — Encounter (HOSPITAL_COMMUNITY): Payer: Self-pay | Admitting: Gastroenterology

## 2020-07-04 LAB — SURGICAL PATHOLOGY

## 2020-07-05 DIAGNOSIS — I2510 Atherosclerotic heart disease of native coronary artery without angina pectoris: Secondary | ICD-10-CM | POA: Diagnosis not present

## 2020-07-05 DIAGNOSIS — E1121 Type 2 diabetes mellitus with diabetic nephropathy: Secondary | ICD-10-CM | POA: Diagnosis not present

## 2020-07-05 DIAGNOSIS — E785 Hyperlipidemia, unspecified: Secondary | ICD-10-CM | POA: Diagnosis not present

## 2020-07-05 DIAGNOSIS — E1140 Type 2 diabetes mellitus with diabetic neuropathy, unspecified: Secondary | ICD-10-CM | POA: Diagnosis not present

## 2020-07-05 DIAGNOSIS — M170 Bilateral primary osteoarthritis of knee: Secondary | ICD-10-CM | POA: Diagnosis not present

## 2020-07-05 DIAGNOSIS — K219 Gastro-esophageal reflux disease without esophagitis: Secondary | ICD-10-CM | POA: Diagnosis not present

## 2020-07-05 DIAGNOSIS — F331 Major depressive disorder, recurrent, moderate: Secondary | ICD-10-CM | POA: Diagnosis not present

## 2020-07-05 DIAGNOSIS — I129 Hypertensive chronic kidney disease with stage 1 through stage 4 chronic kidney disease, or unspecified chronic kidney disease: Secondary | ICD-10-CM | POA: Diagnosis not present

## 2020-07-05 DIAGNOSIS — E039 Hypothyroidism, unspecified: Secondary | ICD-10-CM | POA: Diagnosis not present

## 2020-07-26 ENCOUNTER — Other Ambulatory Visit: Payer: Self-pay | Admitting: General Surgery

## 2020-07-26 DIAGNOSIS — C163 Malignant neoplasm of pyloric antrum: Secondary | ICD-10-CM | POA: Diagnosis not present

## 2020-07-26 DIAGNOSIS — I2510 Atherosclerotic heart disease of native coronary artery without angina pectoris: Secondary | ICD-10-CM | POA: Diagnosis not present

## 2020-07-29 DIAGNOSIS — G40909 Epilepsy, unspecified, not intractable, without status epilepticus: Secondary | ICD-10-CM | POA: Diagnosis not present

## 2020-07-29 DIAGNOSIS — E785 Hyperlipidemia, unspecified: Secondary | ICD-10-CM | POA: Diagnosis not present

## 2020-07-29 DIAGNOSIS — I129 Hypertensive chronic kidney disease with stage 1 through stage 4 chronic kidney disease, or unspecified chronic kidney disease: Secondary | ICD-10-CM | POA: Diagnosis not present

## 2020-07-29 DIAGNOSIS — E1140 Type 2 diabetes mellitus with diabetic neuropathy, unspecified: Secondary | ICD-10-CM | POA: Diagnosis not present

## 2020-07-29 DIAGNOSIS — I2510 Atherosclerotic heart disease of native coronary artery without angina pectoris: Secondary | ICD-10-CM | POA: Diagnosis not present

## 2020-07-29 DIAGNOSIS — Z0000 Encounter for general adult medical examination without abnormal findings: Secondary | ICD-10-CM | POA: Diagnosis not present

## 2020-07-29 DIAGNOSIS — Z1159 Encounter for screening for other viral diseases: Secondary | ICD-10-CM | POA: Diagnosis not present

## 2020-07-29 DIAGNOSIS — N183 Chronic kidney disease, stage 3 unspecified: Secondary | ICD-10-CM | POA: Diagnosis not present

## 2020-07-29 DIAGNOSIS — E039 Hypothyroidism, unspecified: Secondary | ICD-10-CM | POA: Diagnosis not present

## 2020-08-02 ENCOUNTER — Telehealth: Payer: Self-pay | Admitting: *Deleted

## 2020-08-02 ENCOUNTER — Other Ambulatory Visit: Payer: Self-pay

## 2020-08-02 ENCOUNTER — Ambulatory Visit: Payer: Medicare PPO | Admitting: Physician Assistant

## 2020-08-02 ENCOUNTER — Encounter: Payer: Self-pay | Admitting: Physician Assistant

## 2020-08-02 VITALS — BP 138/72 | HR 73 | Ht 59.5 in | Wt 219.4 lb

## 2020-08-02 DIAGNOSIS — I2510 Atherosclerotic heart disease of native coronary artery without angina pectoris: Secondary | ICD-10-CM

## 2020-08-02 DIAGNOSIS — I10 Essential (primary) hypertension: Secondary | ICD-10-CM | POA: Diagnosis not present

## 2020-08-02 DIAGNOSIS — G4733 Obstructive sleep apnea (adult) (pediatric): Secondary | ICD-10-CM | POA: Diagnosis not present

## 2020-08-02 DIAGNOSIS — Z01818 Encounter for other preprocedural examination: Secondary | ICD-10-CM | POA: Diagnosis not present

## 2020-08-02 DIAGNOSIS — E785 Hyperlipidemia, unspecified: Secondary | ICD-10-CM | POA: Diagnosis not present

## 2020-08-02 NOTE — Telephone Encounter (Signed)
   [facility] Pre-operative Risk Assessment    Patient Name: [name]  DOB: 1945/12/18  MRN: [id number]   HEARTCARE STAFF: - Please ensure there is not already an duplicate clearance open for this procedure. - Under Visit Info/Reason for Call, type in Other and utilize the format Clearance MM/DD/YY or Clearance TBD. Do not use dashes or single digits. - If request is for dental extraction, please clarify the # of teeth to be extracted.  Request for surgical clearance:  1. What type of surgery is being performed? Robotic distal gastrectomy   2. When is this surgery scheduled? TBD   3. What type of clearance is required (medical clearance vs. Pharmacy clearance to hold med vs. Both)? medical  4. Are there any medications that need to be held prior to surgery and how long?none   5. Practice name and name of physician performing surgery? [facility]   6. What is the office phone number? [phone]   7.   What is the office fax number? [phone]  8.   Anesthesia type (None, local, MAC, general) ? general   Fredia Beets 08/02/2020, 7:40 AM  _________________________________________________________________   (provider comments below)

## 2020-08-02 NOTE — Telephone Encounter (Signed)
Pt agreeable to pre op appt. Pt primary card is Dr. Percival Spanish, though no available appts at [place] office. Pt is agreeable to appt at [facility] location, pt given address for [facility] with verbal understanding. Pt is grateful for the call and our help. I will send notes to [facility] for appt today.

## 2020-08-02 NOTE — Patient Instructions (Addendum)
Medication Instructions:  Your physician recommends that you continue on your current medications as directed. Please refer to the Current Medication list given to you today.  *If you need a refill on your cardiac medications before your next appointment, please call your pharmacy*   Lab Work: None If you have labs (blood work) drawn today and your tests are completely normal, you will receive your results only by: Marland Kitchen MyChart Message (if you have MyChart) OR . A paper copy in the mail If you have any lab test that is abnormal or we need to change your treatment, we will call you to review the results.   Testing/Procedures: Your physician has requested that you have a lexiscan myoview. For further information please visit [email or web address]. Please follow instruction sheet, as given.  Follow-Up: At [facility], you and your health needs are our priority.  As part of our continuing mission to provide you with exceptional heart care, we have created designated Provider Care Teams.  These Care Teams include your primary Cardiologist (physician) and Advanced Practice Providers (APPs -  Physician Assistants and Nurse Practitioners) who all work together to provide you with the care you need, when you need it.  We recommend signing up for the patient portal called "MyChart".  Sign up information is provided on this After Visit Summary.  MyChart is used to connect with patients for Virtual Visits (Telemedicine).  Patients are able to view lab/test results, encounter notes, upcoming appointments, etc.  Non-urgent messages can be sent to your provider as well.   To learn more about what you can do with MyChart, go to [email or web address].    Your next appointment:   1 year(s)  The format for your next appointment:   In Person  Provider:   You may see Minus Breeding, MD or one of the following Advanced Practice Providers on your designated Care Team:    Rosaria Ferries, PA-C  Jory Sims, DNP, ANP

## 2020-08-02 NOTE — Progress Notes (Signed)
Cardiology Office Note    Date:  08/02/2020   ID:  Leah, Obrien 07-25-1945, MRN [id number]   PCP:  Harlan Stains, MD   [place]  Cardiologist:  Minus Breeding, MD  Advanced Practice Provider:  No care team member to display Electrophysiologist:  None   [phone]   No chief complaint on file.   History of Present Illness:  [name] is a 75 y.o. female with history of nonobstructive CAD on cath in 2011-40% LAD, hypertension, HLD, obesity,DM, CKD stage 3, OSA, history of CVA about 10 yrs ago-no residual, history of pericardial effusion.  Last seen by Dr. Percival Spanish 12/10/2018 at which time he considered screening test with exercise treadmill but patient was unable to walk and she was not having symptoms so he recommended risk reduction.  Patient was added onto my schedule this morning for cardiac clearance before undergoing robotic distal gastrectomy for adenocarcinoma by Dr. Stark Klein. She denies chest pain, does get short of breath if she walks 1-2 blocks-has been like this for a long time, no palpitations, dizziness, edema. Had blood work last week. Doesn't use CPAP.    Past Medical History:  Diagnosis Date  . Anxiety   . CAD (coronary artery disease)     (Nonobstructive coronary artery disease 40% LAD stenosis)  2011  . CHF (congestive heart failure) ([place])   . Chronic kidney disease    STAGE 4 KIDNEY DISEASE  . Depression   . DJD (degenerative joint disease)   . DM (diabetes mellitus) ([place])   . GERD (gastroesophageal reflux disease)   . H/O hiatal hernia   . HTN (hypertension)   . Hypothyroidism   . OSA (obstructive sleep apnea)    doesn't wear CPAP  . Stroke [facility])     Past Surgical History:  Procedure Laterality Date  . ABDOMINAL HYSTERECTOMY    . BIOPSY  06/17/2020   Procedure: BIOPSY;  Surgeon: Carol Ada, MD;  Location: [facility];  Service: Endoscopy;;  . BIOPSY  07/01/2020   Procedure: BIOPSY;   Surgeon: Carol Ada, MD;  Location: [facility];  Service: Endoscopy;;  . CARPAL TUNNEL RELEASE    . CERVICAL DISC SURGERY    . ESOPHAGEAL DILATION  06/17/2020   Procedure: ESOPHAGEAL DILATION;  Surgeon: Carol Ada, MD;  Location: [facility]OSCOPY;  Service: Endoscopy;;  . ESOPHAGOGASTRODUODENOSCOPY (EGD) WITH PROPOFOL N/A 06/17/2020   Procedure: ESOPHAGOGASTRODUODENOSCOPY (EGD) WITH PROPOFOL;  Surgeon: Carol Ada, MD;  Location: [facility];  Service: Endoscopy;  Laterality: N/A;  . ESOPHAGOGASTRODUODENOSCOPY (EGD) WITH PROPOFOL N/A 07/01/2020   Procedure: ESOPHAGOGASTRODUODENOSCOPY (EGD) WITH PROPOFOL;  Surgeon: Carol Ada, MD;  Location: [facility];  Service: Endoscopy;  Laterality: N/A;  . HEMOSTASIS CLIP PLACEMENT  07/01/2020   Procedure: HEMOSTASIS CLIP PLACEMENT;  Surgeon: Carol Ada, MD;  Location: [facility];  Service: Endoscopy;;  . POLYPECTOMY  07/01/2020   Procedure: POLYPECTOMY;  Surgeon: Carol Ada, MD;  Location: [facility];  Service: Endoscopy;;  . REPLACEMENT TOTAL KNEE    . SALPINGOOPHORECTOMY    . SUBMUCOSAL INJECTION  07/01/2020   Procedure: SUBMUCOSAL INJECTION;  Surgeon: Carol Ada, MD;  Location: [facility];  Service: Endoscopy;;  . UVULOPALATOPHARYNGOPLASTY      Current Medications: Current Meds  Medication Sig  . ALPRAZolam (XANAX) 0.5 MG tablet Take 0.25-0.5 mg by mouth at bedtime as needed for anxiety.  Marland Kitchen amLODipine (NORVASC) 5 MG tablet Take 5 mg by mouth daily.  Marland Kitchen aspirin 81 MG tablet Take 81 mg  by mouth daily.  Marland Kitchen atorvastatin (LIPITOR) 20 MG tablet Take 20 mg by mouth daily at 6 PM.   . buPROPion (WELLBUTRIN XL) 150 MG 24 hr tablet Take 150 mg by mouth daily.   . Cholecalciferol (VITAMIN D-3) 5000 UNITS TABS Take 5,000 Units by mouth daily.  Marland Kitchen docusate sodium (COLACE) 100 MG capsule Take 100 mg by mouth daily as needed for mild constipation.   Marland Kitchen doxepin (SINEQUAN) 25 MG capsule Take 25 mg by mouth at bedtime.  Marland Kitchen FLUoxetine (PROZAC) 40 MG  capsule Take 40 mg by mouth every morning.  . fluticasone (FLONASE) 50 MCG/ACT nasal spray Place 2 sprays into both nostrils daily as needed for allergies.   . furosemide (LASIX) 40 MG tablet Take 40 mg by mouth daily.  Marland Kitchen gabapentin (NEURONTIN) 300 MG capsule Take 600 mg by mouth daily.  Marland Kitchen glipiZIDE (GLUCOTROL) 5 MG tablet Take 5 mg by mouth 2 (two) times daily.  Marland Kitchen levETIRAcetam (KEPPRA) 500 MG tablet Take 1 tablet (500 mg total) by mouth 2 (two) times daily.  Marland Kitchen levothyroxine (SYNTHROID, LEVOTHROID) 137 MCG tablet Take 137 mcg by mouth daily before breakfast.  . metoprolol (LOPRESSOR) 50 MG tablet Take 50 mg by mouth daily.  . mirtazapine (REMERON) 45 MG tablet Take 45 mg by mouth at bedtime.  . pantoprazole (PROTONIX) 40 MG tablet Take 40 mg by mouth every morning.  . Potassium Chloride ER 20 MEQ TBCR Take 20 mEq by mouth every morning.  Marland Kitchen rOPINIRole (REQUIP) 0.25 MG tablet Take 0.25 mg by mouth at bedtime.  . RYBELSUS 7 MG TABS Take 7 mg by mouth daily.  . saxagliptin HCl (ONGLYZA) 5 MG TABS tablet Take 2.5 mg by mouth 2 (two) times daily.  . traZODone (DESYREL) 50 MG tablet Take 100 mg by mouth at bedtime. Patient takes 1 pill and a half.  . valsartan (DIOVAN) 320 MG tablet Take 320 mg by mouth every morning.     Allergies:   Codeine, Meperidine hcl, Morphine, Nylon, and Penicillins   Social History   Socioeconomic History  . Marital status: Married    Spouse name: Not on file  . Number of children: 3  . Years of education: 12th  . Highest education level: Not on file  Occupational History  . Occupation: retired  Tobacco Use  . Smoking status: Never Smoker  . Smokeless tobacco: Never Used  Substance and Sexual Activity  . Alcohol use: No    Alcohol/week: 0.0 standard drinks  . Drug use: No  . Sexual activity: Never  Other Topics Concern  . Not on file  Social History Narrative   Patient is married with 3 children.   Patient is right handed.   Patient has 12 th grade  education.   Patient drinks 1 soda occ.   Social Determinants of Health   Financial Resource Strain: Not on file  Food Insecurity: Not on file  Transportation Needs: Not on file  Physical Activity: Not on file  Stress: Not on file  Social Connections: Not on file     Family History:  The patient's family history includes Breast cancer in her mother and sister.   ROS:   Please see the history of present illness.    ROS All other systems reviewed and are negative.   PHYSICAL EXAM:   VS:  BP 138/72   Pulse 73   Ht 4' 11.5" (1.511 m)   Wt 219 lb 6.4 oz (99.5 kg)   SpO2 97%  BMI 43.57 kg/m   Physical Exam  GEN: Obese in no acute distress  Neck: no JVD, carotid bruits, or masses Cardiac:RRR; no murmurs, rubs, or gallops  Respiratory:  clear to auscultation bilaterally, normal work of breathing GI: soft, nontender, nondistended, + BS Ext: without cyanosis, clubbing, or edema, Good distal pulses bilaterally Neuro:  Alert and Oriented x 3 Psych: euthymic mood, full affect  Wt Readings from Last 3 Encounters:  08/02/20 219 lb 6.4 oz (99.5 kg)  07/01/20 210 lb (95.3 kg)  06/17/20 210 lb (95.3 kg)      Studies/Labs Reviewed:   EKG:  EKG is  ordered today.  The ekg ordered today demonstrates NSR with PVC low voltage, ant TWI unchanged  Recent Labs: No results found for requested labs within last 8760 hours.   Lipid Panel    Component Value Date/Time   CHOL 118 10/22/2013 0440   TRIG 192 (H) 10/22/2013 0440   HDL 30 (L) 10/22/2013 0440   CHOLHDL 3.9 10/22/2013 0440   VLDL 38 10/22/2013 0440   LDLCALC 50 10/22/2013 0440    Additional studies/ records that were reviewed today include:  Echo 10/22/13 Study Conclusions   - Left ventricle: E/e&'>14 suggestive of elevated LV filling    pressures. The cavity size was normal. Systolic function was    normal. The estimated ejection fraction was in the range of 55%    to 60%. There was an increased relative contribution  of atrial    contraction to ventricular filling. Doppler parameters are    consistent with abnormal left ventricular relaxation (grade 1    diastolic dysfunction).  - Pericardium, extracardiac: A trivial, free-flowing pericardial    effusion was identified along the left ventricular free wall. The    fluid had no internal echoes.There was no evidence of hemodynamic    compromise.   Transthoracic echocardiography.  M-mode, complete 2D, spectral  Doppler, and color Doppler.  Birthdate:  Patient birthdate:  08-03-1945.  Age:  Patient is 75 yr old.  Sex:  Gender: female.  Height:  Height: 149.9 cm. Height: 59 in.  Weight:  Weight: 84 kg.  Weight: 184.8 lb.  Body mass index:  BMI: 37.4 kg/m^2.  Body  surface area:    BSA: 1.92 m^2.  Blood pressure:     140/58  Patient status:  Inpatient.  Study date:  Study date: 10/22/2013.  Study time: 10:52 AM.  Location:  Echo laboratory.     Risk Assessment/Calculations:         ASSESSMENT:    1. Preoperative clearance   2. Coronary artery disease involving native coronary artery of native heart without angina pectoris   3. Essential hypertension   4. Hyperlipidemia, unspecified hyperlipidemia type   5. Morbid obesity ([place])   6. OBSTRUCTIVE SLEEP APNEA      PLAN:  In order of problems listed above:  Preoperative clearance before undergoing robotic distal gastrectomy for adenocarcinoma by Dr. Stark Klein.  Patient has chronic dyspnea on exertion that is unchanged no significant chest pain.  Her RCRI index is elevated 11%.  She is not active but METs are over 4.  With history of CAD, CVA, hypertension, HLD and CKD will proceed with Lexiscan Myoview before clearing for surgery.  According to the Revised Cardiac Risk Index (RCRI), her Perioperative Risk of Major Cardiac Event is (%): 11  Her Functional Capacity in METs is: 4.64 according to the Duke Activity Status Index (DASI).    CAD nonobstructive on cath in 2011  with 40% LAD no angina  but dyspnea on exertion walking 1-2 blocks  Hypertension blood pressure controlled  Hyperlipidemia LDL 105 on recent blood work at PCP.  After surgery consider increasing Lipitor to 40 mg daily  Obesity exercise and weight loss recommended  CKD stage 3 creatinine 1.4 last week  OSA does not use CPAP  History of CVA over 10 years ago without residual, on aspirin   Shared Decision Making/Informed Consent   Shared Decision Making/Informed Consent The risks [chest pain, shortness of breath, cardiac arrhythmias, dizziness, blood pressure fluctuations, myocardial infarction, stroke/transient ischemic attack, nausea, vomiting, allergic reaction, radiation exposure, metallic taste sensation and life-threatening complications (estimated to be 1 in 10,000)], benefits (risk stratification, diagnosing coronary artery disease, treatment guidance) and alternatives of a nuclear stress test were discussed in detail with Ms. Mustapha and she agrees to proceed.       Medication Adjustments/Labs and Tests Ordered: Current medicines are reviewed at length with the patient today.  Concerns regarding medicines are outlined above.  Medication changes, Labs and Tests ordered today are listed in the Patient Instructions below. Patient Instructions  Medication Instructions:  Your physician recommends that you continue on your current medications as directed. Please refer to the Current Medication list given to you today.  *If you need a refill on your cardiac medications before your next appointment, please call your pharmacy*   Lab Work: None If you have labs (blood work) drawn today and your tests are completely normal, you will receive your results only by: Marland Kitchen MyChart Message (if you have MyChart) OR . A paper copy in the mail If you have any lab test that is abnormal or we need to change your treatment, we will call you to review the results.   Testing/Procedures: Your physician has requested that  you have a lexiscan myoview. For further information please visit [email or web address]. Please follow instruction sheet, as given.  Follow-Up: At [facility], you and your health needs are our priority.  As part of our continuing mission to provide you with exceptional heart care, we have created designated Provider Care Teams.  These Care Teams include your primary Cardiologist (physician) and Advanced Practice Providers (APPs -  Physician Assistants and Nurse Practitioners) who all work together to provide you with the care you need, when you need it.  We recommend signing up for the patient portal called "MyChart".  Sign up information is provided on this After Visit Summary.  MyChart is used to connect with patients for Virtual Visits (Telemedicine).  Patients are able to view lab/test results, encounter notes, upcoming appointments, etc.  Non-urgent messages can be sent to your provider as well.   To learn more about what you can do with MyChart, go to [email or web address].    Your next appointment:   1 year(s)  The format for your next appointment:   In Person  Provider:   You may see Minus Breeding, MD or one of the following Advanced Practice Providers on your designated Care Team:    Rosaria Ferries, PA-C  Jory Sims, DNP, ANP      Signed, Ermalinda Barrios, PA-C  08/02/2020 12:15 PM    [address], [place], [place]  [address] Phone: [phone]; Fax: [phone]

## 2020-08-02 NOTE — Telephone Encounter (Signed)
   Patient Name: [name]  DOB: Dec 19, 1945  MRN: [id number]   Primary Cardiologist: Minus Breeding, MD  Chart reviewed as part of pre-operative protocol coverage. Patient has not been seen since 11/2018. Therefore, she will require a follow-up visit in order to better assess preoperative cardiovascular risk.  Pre-op covering staff: - Please schedule appointment and call patient to inform them. If patient already had an upcoming appointment within acceptable timeframe, please add "pre-op clearance" to the appointment notes so provider is aware. - Please contact requesting surgeon's office via preferred method (i.e, phone, fax) to inform them of need for appointment prior to surgery.  If applicable, this message will also be routed to pharmacy pool and/or primary cardiologist for input on holding anticoagulant/antiplatelet agent as requested below so that this information is available to the clearing provider at time of patient's appointment.   Darreld Mclean, PA-C  08/02/2020, 7:50 AM

## 2020-08-10 ENCOUNTER — Telehealth (HOSPITAL_COMMUNITY): Payer: Self-pay | Admitting: *Deleted

## 2020-08-10 NOTE — Telephone Encounter (Signed)
Close encounter 

## 2020-08-11 ENCOUNTER — Ambulatory Visit (HOSPITAL_COMMUNITY)
Admission: RE | Admit: 2020-08-11 | Discharge: 2020-08-11 | Disposition: A | Discharge status: Home / Self Care | Payer: Medicare PPO | Source: Ambulatory Visit | Attending: Internal Medicine | Admitting: Internal Medicine

## 2020-08-11 ENCOUNTER — Other Ambulatory Visit: Payer: Self-pay

## 2020-08-11 DIAGNOSIS — E1121 Type 2 diabetes mellitus with diabetic nephropathy: Secondary | ICD-10-CM | POA: Diagnosis not present

## 2020-08-11 DIAGNOSIS — N183 Chronic kidney disease, stage 3 unspecified: Secondary | ICD-10-CM | POA: Diagnosis not present

## 2020-08-11 DIAGNOSIS — Z01818 Encounter for other preprocedural examination: Secondary | ICD-10-CM

## 2020-08-11 DIAGNOSIS — F324 Major depressive disorder, single episode, in partial remission: Secondary | ICD-10-CM | POA: Diagnosis not present

## 2020-08-11 DIAGNOSIS — I2510 Atherosclerotic heart disease of native coronary artery without angina pectoris: Secondary | ICD-10-CM

## 2020-08-11 DIAGNOSIS — Z01810 Encounter for preprocedural cardiovascular examination: Secondary | ICD-10-CM

## 2020-08-11 DIAGNOSIS — E039 Hypothyroidism, unspecified: Secondary | ICD-10-CM | POA: Diagnosis not present

## 2020-08-11 DIAGNOSIS — E785 Hyperlipidemia, unspecified: Secondary | ICD-10-CM | POA: Diagnosis not present

## 2020-08-11 DIAGNOSIS — K219 Gastro-esophageal reflux disease without esophagitis: Secondary | ICD-10-CM | POA: Diagnosis not present

## 2020-08-11 DIAGNOSIS — E1140 Type 2 diabetes mellitus with diabetic neuropathy, unspecified: Secondary | ICD-10-CM | POA: Diagnosis not present

## 2020-08-11 LAB — MYOCARDIAL PERFUSION IMAGING
LV dias vol: 93 mL (ref 46–106)
LV sys vol: 47 mL
Peak HR: 77 {beats}/min
Rest HR: 59 {beats}/min
SDS: 6
SRS: 2
SSS: 8
TID: 1.29

## 2020-08-11 MED ORDER — TECHNETIUM TC 99M TETROFOSMIN IV KIT
9.9000 | PACK | Freq: Once | INTRAVENOUS | Status: AC | PRN
  Administered 2020-08-11: 9.9 via INTRAVENOUS
  Filled 2020-08-11: qty 10

## 2020-08-11 MED ORDER — TECHNETIUM TC 99M TETROFOSMIN IV KIT
31.0000 | PACK | Freq: Once | INTRAVENOUS | Status: AC | PRN
  Administered 2020-08-11: 31 via INTRAVENOUS
  Filled 2020-08-11: qty 31

## 2020-08-11 MED ORDER — ADENOSINE (DIAGNOSTIC) 3 MG/ML IV SOLN
0.5600 mg/kg | Freq: Once | INTRAVENOUS | Status: AC
  Administered 2020-08-11: 55.5 mg via INTRAVENOUS

## 2020-08-12 ENCOUNTER — Other Ambulatory Visit: Payer: Self-pay

## 2020-08-12 DIAGNOSIS — I2510 Atherosclerotic heart disease of native coronary artery without angina pectoris: Secondary | ICD-10-CM

## 2020-08-18 ENCOUNTER — Other Ambulatory Visit: Payer: Self-pay

## 2020-08-18 ENCOUNTER — Ambulatory Visit (HOSPITAL_COMMUNITY): Payer: Medicare PPO | Attending: Cardiology

## 2020-08-18 DIAGNOSIS — I2510 Atherosclerotic heart disease of native coronary artery without angina pectoris: Secondary | ICD-10-CM

## 2020-08-18 LAB — ECHOCARDIOGRAM COMPLETE
Area-P 1/2: 2.46 cm2
S' Lateral: 2.9 cm

## 2020-08-18 MED ORDER — PERFLUTREN LIPID MICROSPHERE
1.0000 mL | INTRAVENOUS | Status: AC | PRN
  Administered 2020-08-18: 2 mL via INTRAVENOUS

## 2020-08-22 ENCOUNTER — Telehealth: Payer: Self-pay | Admitting: Physician Assistant

## 2020-08-22 NOTE — Telephone Encounter (Signed)
The patient has been notified of the result and verbalized understanding.  All questions (if any) were answered. Audreyana Huntsberry, [facility] 08/22/2020 2:01 PM

## 2020-08-22 NOTE — Telephone Encounter (Signed)
[name] is calling requesting her Echo results. She states she is also supposed to be setting up a surgery with our office and would like to arrange this when calling back as well. Please advise.

## 2020-08-25 DIAGNOSIS — E1140 Type 2 diabetes mellitus with diabetic neuropathy, unspecified: Secondary | ICD-10-CM | POA: Diagnosis not present

## 2020-09-09 DIAGNOSIS — M5416 Radiculopathy, lumbar region: Secondary | ICD-10-CM | POA: Diagnosis not present

## 2020-09-21 DIAGNOSIS — M5442 Lumbago with sciatica, left side: Secondary | ICD-10-CM | POA: Diagnosis not present

## 2020-09-23 DIAGNOSIS — E1140 Type 2 diabetes mellitus with diabetic neuropathy, unspecified: Secondary | ICD-10-CM | POA: Diagnosis not present

## 2020-09-28 DIAGNOSIS — I2510 Atherosclerotic heart disease of native coronary artery without angina pectoris: Secondary | ICD-10-CM | POA: Diagnosis not present

## 2020-09-28 DIAGNOSIS — E1165 Type 2 diabetes mellitus with hyperglycemia: Secondary | ICD-10-CM | POA: Diagnosis not present

## 2020-09-28 DIAGNOSIS — E1121 Type 2 diabetes mellitus with diabetic nephropathy: Secondary | ICD-10-CM | POA: Diagnosis not present

## 2020-09-28 DIAGNOSIS — F324 Major depressive disorder, single episode, in partial remission: Secondary | ICD-10-CM | POA: Diagnosis not present

## 2020-09-28 DIAGNOSIS — E785 Hyperlipidemia, unspecified: Secondary | ICD-10-CM | POA: Diagnosis not present

## 2020-09-28 DIAGNOSIS — E039 Hypothyroidism, unspecified: Secondary | ICD-10-CM | POA: Diagnosis not present

## 2020-09-28 DIAGNOSIS — K219 Gastro-esophageal reflux disease without esophagitis: Secondary | ICD-10-CM | POA: Diagnosis not present

## 2020-09-28 DIAGNOSIS — I129 Hypertensive chronic kidney disease with stage 1 through stage 4 chronic kidney disease, or unspecified chronic kidney disease: Secondary | ICD-10-CM | POA: Diagnosis not present

## 2020-09-28 DIAGNOSIS — E1140 Type 2 diabetes mellitus with diabetic neuropathy, unspecified: Secondary | ICD-10-CM | POA: Diagnosis not present

## 2020-10-06 ENCOUNTER — Other Ambulatory Visit: Payer: Self-pay | Admitting: General Surgery

## 2020-10-19 ENCOUNTER — Other Ambulatory Visit: Payer: Self-pay

## 2020-10-19 ENCOUNTER — Encounter (HOSPITAL_COMMUNITY): Payer: Self-pay

## 2020-10-19 ENCOUNTER — Encounter (HOSPITAL_COMMUNITY)
Admission: RE | Admit: 2020-10-19 | Discharge: 2020-10-19 | Disposition: A | Discharge status: Home / Self Care | Payer: Medicare PPO | Source: Ambulatory Visit | Attending: General Surgery | Admitting: General Surgery

## 2020-10-19 DIAGNOSIS — Z01812 Encounter for preprocedural laboratory examination: Secondary | ICD-10-CM | POA: Diagnosis not present

## 2020-10-19 HISTORY — DX: Malignant (primary) neoplasm, unspecified: C80.1

## 2020-10-19 LAB — COMPREHENSIVE METABOLIC PANEL
ALT: 16 U/L (ref 0–44)
AST: 23 U/L (ref 15–41)
Albumin: 3.8 g/dL (ref 3.5–5.0)
Alkaline Phosphatase: 73 U/L (ref 38–126)
Anion gap: 10 (ref 5–15)
BUN: 11 mg/dL (ref 8–23)
CO2: 21 mmol/L — ABNORMAL LOW (ref 22–32)
Calcium: 9.2 mg/dL (ref 8.9–10.3)
Chloride: 108 mmol/L (ref 98–111)
Creatinine, Ser: 1.36 mg/dL — ABNORMAL HIGH (ref 0.44–1.00)
GFR, Estimated: 40 mL/min — ABNORMAL LOW (ref >60)
Glucose, Bld: 146 mg/dL — ABNORMAL HIGH (ref 70–99)
Potassium: 4.3 mmol/L (ref 3.5–5.1)
Sodium: 139 mmol/L (ref 135–145)
Total Bilirubin: 0.2 mg/dL — ABNORMAL LOW (ref 0.3–1.2)
Total Protein: 7.2 g/dL (ref 6.5–8.1)

## 2020-10-19 LAB — CBC WITH DIFFERENTIAL/PLATELET
Abs Immature Granulocytes: 0.1 10*3/uL — ABNORMAL HIGH (ref 0.00–0.07)
Basophils Absolute: 0.1 10*3/uL (ref 0.0–0.1)
Basophils Relative: 1 %
Eosinophils Absolute: 0.2 10*3/uL (ref 0.0–0.5)
Eosinophils Relative: 2 %
HCT: 46.7 % — ABNORMAL HIGH (ref 36.0–46.0)
Hemoglobin: 14.7 g/dL (ref 12.0–15.0)
Immature Granulocytes: 1 %
Lymphocytes Relative: 37 %
Lymphs Abs: 4.6 10*3/uL — ABNORMAL HIGH (ref 0.7–4.0)
MCH: 29.1 pg (ref 26.0–34.0)
MCHC: 31.5 g/dL (ref 30.0–36.0)
MCV: 92.3 fL (ref 80.0–100.0)
Monocytes Absolute: 0.9 10*3/uL (ref 0.1–1.0)
Monocytes Relative: 7 %
Neutro Abs: 6.4 10*3/uL (ref 1.7–7.7)
Neutrophils Relative %: 52 %
Platelets: 257 10*3/uL (ref 150–400)
RBC: 5.06 MIL/uL (ref 3.87–5.11)
RDW: 14.1 % (ref 11.5–15.5)
WBC: 12.3 10*3/uL — ABNORMAL HIGH (ref 4.0–10.5)
nRBC: 0 % (ref 0.0–0.2)

## 2020-10-19 LAB — URINALYSIS, ROUTINE W REFLEX MICROSCOPIC
Bilirubin Urine: NEGATIVE
Glucose, UA: >=500 mg/dL — AB
Ketones, ur: NEGATIVE mg/dL
Nitrite: POSITIVE — AB
Protein, ur: 30 mg/dL — AB
Specific Gravity, Urine: 1.015 (ref 1.005–1.030)
pH: 5 (ref 5.0–8.0)

## 2020-10-19 LAB — PROTIME-INR
INR: 1 (ref 0.8–1.2)
Prothrombin Time: 13.2 seconds (ref 11.4–15.2)

## 2020-10-19 LAB — HEMOGLOBIN A1C
Hgb A1c MFr Bld: 6.1 % — ABNORMAL HIGH (ref 4.8–5.6)
Mean Plasma Glucose: 128.37 mg/dL

## 2020-10-19 LAB — GLUCOSE, CAPILLARY: Glucose-Capillary: 176 mg/dL — ABNORMAL HIGH (ref 70–99)

## 2020-10-19 LAB — TYPE AND SCREEN
ABO/RH(D): A NEG
Antibody Screen: NEGATIVE

## 2020-10-19 NOTE — Progress Notes (Signed)
PCP - Harlan Stains, MD Cardiologist - Benjamin Stain  PPM/ICD - denies Device Orders -  Rep Notified -   Chest x-ray -  EKG -08/02/20  Stress Test - 08/11/20 ECHO - 08/18/20 Cardiac Cath -denies   Sleep Study - 09/12/2010 CPAP - has one,but doesn't wear it.  Fasting Blood Sugar -97  Checks Blood Sugar 2 times a day  Blood Thinner Instructions:n/a Aspirin Instructions:pt to call Dr. Marlowe Aschoff office for instructions regarding Aspirin.   ERAS Protcol -clear liquids until 0430 PRE-SURGERY Ensure or G2- no drink ordered  COVID TEST- Scheduled for Friday, June 24,2022    Anesthesia review: yes-history of coronary artery disease  Patient denies shortness of breath, fever, cough and chest pain at PAT appointment   All instructions explained to the patient, with a verbal understanding of the material. Patient agrees to go over the instructions while at home for a better understanding. Patient also instructed to self quarantine after being tested for COVID-19. The opportunity to ask questions was provided.

## 2020-10-19 NOTE — Progress Notes (Signed)
Results of pt's UA reported to Wendy-Many bacteria,moderate leukocytes,positive nitrites. Abigail Butts will inform Dr. Barry Dienes.

## 2020-10-19 NOTE — Progress Notes (Signed)
Surgical Instructions    Your procedure is scheduled on 10/22/2020.  Report to [facility] Main Entrance "A" at 5:30 A.M., then check in with the Admitting office.  Call this number if you have problems the morning of surgery:  [phone]   If you have any questions prior to your surgery date call [phone]: Open Monday-Friday 8am-4pm    Remember:  Do not eat after midnight the night before your surgery  You may drink clear liquids until 4:30 the morning of your surgery.   Clear liquids allowed are: Water, Non-Citrus Juices (without pulp), Carbonated Beverages, Clear Tea, Black Coffee Only, and Gatorade    Take these medicines the morning of surgery with A SIP OF WATER :             ALPRAZolam (XANAX) if needed             amLODipine (NORVASC)              buPROPion (WELLBUTRIN XL)             FLUoxetine (PROZAC)              fluticasone (FLONASE) if needed             gabapentin (NEURONTIN)             levETIRAcetam (KEPPRA)             levothyroxine (SYNTHROID, LEVOTHROID)             pantoprazole (PROTONIX)              As of today, STOP taking any Aspirin (unless otherwise instructed by your surgeon) Aleve, Naproxen, Ibuprofen, Motrin, Advil, Goody's, BC's, all herbal medications, fish oil, and all vitamins.    WHAT DO I DO ABOUT MY DIABETES MEDICATION?   Do not take oral diabetes medicines (pills) the morning of surgery. Do not take glipiZIDE (GLUCOTROL) or RYBELSUS the morning of surgery.     HOW TO MANAGE YOUR DIABETES BEFORE AND AFTER SURGERY  Why is it important to control my blood sugar before and after surgery? Improving blood sugar levels before and after surgery helps healing and can limit problems. A way of improving blood sugar control is eating a healthy diet by:  Eating less sugar and carbohydrates  Increasing activity/exercise  Talking with your doctor about reaching your blood sugar goals High blood sugars (greater than 180 mg/dL) can raise your  risk of infections and slow your recovery, so you will need to focus on controlling your diabetes during the weeks before surgery. Make sure that the doctor who takes care of your diabetes knows about your planned surgery including the date and location.  How do I manage my blood sugar before surgery? Check your blood sugar at least 4 times a day, starting 2 days before surgery, to make sure that the level is not too high or low.  Check your blood sugar the morning of your surgery when you wake up and every 2 hours until you get to the Short Stay unit.  If your blood sugar is less than 70 mg/dL, you will need to treat for low blood sugar: Do not take insulin. Treat a low blood sugar (less than 70 mg/dL) with  cup of clear juice (cranberry or apple), 4 glucose tablets, OR glucose gel. Recheck blood sugar in 15 minutes after treatment (to make sure it is greater than 70 mg/dL). If your blood sugar is not greater than 70 mg/dL on  recheck, call [phone] for further instructions. Report your blood sugar to the short stay nurse when you get to Short Stay.  If you are admitted to the hospital after surgery: Your blood sugar will be checked by the staff and you will probably be given insulin after surgery (instead of oral diabetes medicines) to make sure you have good blood sugar levels. The goal for blood sugar control after surgery is 80-180 mg/dL.            Do not wear jewelry or makeup Do not wear lotions, powders, perfumes/colognes, or deodorant. Do not shave 48 hours prior to surgery.  Men may shave face and neck. Do not bring valuables to the hospital. DO Not wear nail polish, gel polish, artificial nails, or any other type of covering on  natural nails including finger and toenails. If patients have artificial nails, gel coating, etc. that need to be removed by a nail salon please have this removed prior to surgery or surgery may need to be canceled/delayed if the surgeon/ anesthesia  feels like the patient is unable to be adequately monitored.             [facility] is not responsible for any belongings or valuables.  Do NOT Smoke (Tobacco/Vaping) or drink Alcohol 24 hours prior to your procedure If you use a CPAP at night, you may bring all equipment for your overnight stay.   Contacts, glasses, dentures or bridgework may not be worn into surgery, please bring cases for these belongings    For patients admi tted to the hospital, discharge time will be determined by your treatment team.   Patients discharged the day of surgery will not be allowed to drive home, and someone needs to stay with them for 24 hours.  ONLY 1 SUPPORT PERSON MAY BE PRESENT WHILE YOU ARE IN SURGERY. IF YOU ARE TO BE ADMITTED ONCE YOU ARE IN YOUR ROOM YOU WILL BE ALLOWED TWO (2) VISITORS.  Minor children may have two parents present. Special consideration for safety and communication needs will be reviewed on a case by case basis.  Special instructions:    Oral Hygiene is also important to reduce your risk of infection.  Remember - BRUSH YOUR TEETH THE MORNING OF SURGERY WITH YOUR REGULAR TOOTHPASTE   [facility]- Preparing For Surgery  Before surgery, you can play an important role. Because skin is not sterile, your skin needs to be as free of germs as possible. You can reduce the number of germs on your skin by washing with CHG (chlorahexidine gluconate) Soap before surgery.  CHG is an antiseptic cleaner which kills germs and bonds with the skin to continue killing germs even after washing.     Please do not use if you have an allergy to CHG or antibacterial soaps. If your skin becomes reddened/irritated stop using the CHG.  Do not shave (including legs and underarms) for at least 48 hours prior to first CHG shower. It is OK to shave your face.  Please follow these instructions carefully.     Shower the NIGHT BEFORE SURGERY and the MORNING OF SURGERY with CHG Soap.   If you chose to  wash your hair, wash your hair first as usual with your normal shampoo. After you shampoo, rinse your hair and body thoroughly to remove the shampoo.  Then [employer] and genitals (private parts) with your normal soap and rinse thoroughly to remove soap.  After that Use CHG Soap as you would any  other liquid soap. You can apply CHG directly to the skin and wash gently with a scrungie or a clean washcloth.   Apply the CHG Soap to your body ONLY FROM THE NECK DOWN.  Do not use on open wounds or open sores. Avoid contact with your eyes, ears, mouth and genitals (private parts). Wash Face and genitals (private parts)  with your normal soap.   Wash thoroughly, paying special attention to the area where your surgery will be performed.  Thoroughly rinse your body with warm water from the neck down.  DO NOT shower/wash with your normal soap after using and rinsing off the CHG Soap.  Pat yourself dry with a CLEAN TOWEL.  Wear CLEAN PAJAMAS to bed the night before surgery  Place CLEAN SHEETS on your bed the night before your surgery  DO NOT SLEEP WITH PETS.   Day of Surgery:  Take a shower with CHG soap. Wear Clean/Comfortable clothing the morning of surgery Do not apply any deodorants/lotions.   Remember to brush your teeth WITH YOUR REGULAR TOOTHPASTE.   Please read over the following fact sheets that you were given.

## 2020-10-20 NOTE — Anesthesia Preprocedure Evaluation (Addendum)
Anesthesia Evaluation  Patient identified by MRN, date of birth, ID band Patient awake    Reviewed: Allergy & Precautions, NPO status , Patient's Chart, lab work & pertinent test results, reviewed documented beta blocker date and time   Airway Mallampati: III  TM Distance: >3 FB Neck ROM: Limited    Dental  (+) Edentulous Lower, Edentulous Upper, Dental Advisory Given   Pulmonary sleep apnea ,    Pulmonary exam normal breath sounds clear to auscultation       Cardiovascular hypertension, Pt. on medications and Pt. on home beta blockers + CAD and +CHF  Normal cardiovascular exam Rhythm:Regular Rate:Normal  Echo 07/2020 1. Difficult study due to poor acoustic windows. Left ventricular ejection fraction, by estimation, is 60 to 65%. The left ventricle has normal function. Based on limited views, the left ventricle has no regional wall motion abnormalities. Left ventricular diastolic parameters are consistent with Grade I diastolic dysfunction (impaired relaxation). Elevated left atrial pressure.  2. Right ventricular systolic function is normal. The right ventricular size is normal. Tricuspid regurgitation signal is inadequate for assessing PA pressure.  3. The mitral valve is grossly normal. Trivial mitral valve  regurgitation. No evidence of mitral stenosis.  4. The aortic valve is tricuspid. Aortic valve regurgitation is trivial. No aortic stenosis is present.  5. The inferior vena cava is normal in size with greater than 50% respiratory variability, suggesting right atrial pressure of 3 mmHg.    Neuro/Psych Seizures -,  PSYCHIATRIC DISORDERS Anxiety Depression TIACVA    GI/Hepatic Neg liver ROS, hiatal hernia, GERD  ,  Endo/Other  diabetes, Well Controlled, Type 2, Oral Hypoglycemic AgentsHypothyroidism Morbid obesity  Renal/GU Renal disease     Musculoskeletal  (+) Arthritis ,   Abdominal (+) + obese,   Peds   Hematology negative hematology ROS (+)   Anesthesia Other Findings   Reproductive/Obstetrics                                                            Anesthesia Evaluation  Patient identified by MRN, date of birth, ID band Patient awake    Reviewed: Allergy & Precautions, NPO status , Patient's Chart, lab work & pertinent test results  History of Anesthesia Complications Negative for: history of anesthetic complications  Airway Mallampati: II  TM Distance: >3 FB Neck ROM: Full    Dental  (+) Dental Advisory Given, Edentulous Upper   Pulmonary sleep apnea ,    Pulmonary exam normal        Cardiovascular hypertension, Pt. on medications and Pt. on home beta blockers + CAD  Normal cardiovascular exam     Neuro/Psych Seizures -, Well Controlled,  PSYCHIATRIC DISORDERS Anxiety Depression TIACVA, No Residual Symptoms    GI/Hepatic Neg liver ROS, hiatal hernia, GERD  Medicated and Controlled,  Endo/Other  diabetes, Type 2, Oral Hypoglycemic AgentsHypothyroidism Morbid obesity  Renal/GU CRFRenal disease     Musculoskeletal  (+) Arthritis ,   Abdominal   Peds  Hematology negative hematology ROS (+)   Anesthesia Other Findings Covid test negative   Reproductive/Obstetrics                            Anesthesia Physical Anesthesia Plan  ASA: III  Anesthesia Plan: MAC  Post-op Pain Management:    Induction: Intravenous  PONV Risk Score and Plan: 2 and Propofol infusion and Treatment may vary due to age or medical condition  Airway Management Planned: Nasal Cannula and Natural Airway  Additional Equipment: None  Intra-op Plan:   Post-operative Plan:   Informed Consent: I have reviewed the patients History and Physical, chart, labs and discussed the procedure including the risks, benefits and alternatives for the proposed anesthesia with the patient or authorized representative who has  indicated his/her understanding and acceptance.       Plan Discussed with: CRNA and Anesthesiologist  Anesthesia Plan Comments:        Anesthesia Quick Evaluation  Anesthesia Physical Anesthesia Plan  ASA: 3  Anesthesia Plan: General   Post-op Pain Management:    Induction: Intravenous  PONV Risk Score and Plan: 4 or greater and Ondansetron, Dexamethasone, Treatment may vary due to age or medical condition, Diphenhydramine and Propofol infusion  Airway Management Planned: Oral ETT  Additional Equipment: Arterial line  Intra-op Plan:   Post-operative Plan: Extubation in OR  Informed Consent: I have reviewed the patients History and Physical, chart, labs and discussed the procedure including the risks, benefits and alternatives for the proposed anesthesia with the patient or authorized representative who has indicated his/her understanding and acceptance.     Dental advisory given  Plan Discussed with: CRNA  Anesthesia Plan Comments: (PAT note by Karoline Caldwell, PA-C: Follows with cardiology for hx of nonobstructive CAD on cath in 2011-40% LAD, hypertension,HLD, OSA (does not wear CPAP), history of CVAabout 10 yrs ago-no residual, history of pericardial effusion. Seen 08/02/20 for preop clearance. Per note, "Preoperative clearance before undergoing robotic distal gastrectomy for adenocarcinomaby Dr.Faera Byerly.Patient has chronic dyspnea on exertion that is unchanged no significant chest pain. Her RCRI index is elevated 11%. She is not active but METs are over 4. With history of CAD, CVA, hypertension, HLD and CKD will proceed with Lexiscan Myoview before clearing for surgery. According to the Revised Cardiac Risk Index (RCRI),herPerioperative Risk of Major Cardiac Event is (%): 11. HerFunctional Capacity in METs is: 4.64according to the Duke Activity Status Index (DASI)." Nuclear stress was read as intermediate risk due to anterior defect. There was no ischemia  on perfusion images. Ermalinda Barrios, PA-C commented on result stating, "Carlton Adam shows a fixed defect that is likely from breast shadowing. No ischemia. Please order echo to verify heart function and if all looks good shecan proceed with needed surgery." TTE was done 08/18/20 and Ermalinda Barrios, PA-C commented on result stating, "Echo shows normal heart function. Heart has trouble relaxing. She can becleared for surgery."  History of seizures, maintained on Keppra.  DM2 well-controlled, preop A1c 6.1.  Creatinine mildly elevated on preop labs 1.36, consistent with history of CKD 3.  Remainder of preop labs unremarkable.  Preop urinalysis abnormal, per epic, Dr. Barry Dienes has reviewed.  EKG 08/02/2020: NSR with PVC.  Rate 73.  Nonspecific ST changes.  Low voltage.  No changes.  TTE 08/18/20: 1. Difficult study due to poor acoustic windows. Left ventricular  ejection fraction, by estimation, is 60 to 65%. The left ventricle has  normal function. Based on limited views, the left ventricle has no  regional wall motion abnormalities. Left  ventricular diastolic parameters are consistent with Grade I diastolic  dysfunction (impaired relaxation). Elevated left atrial pressure.  2. Right ventricular systolic function is normal. The right ventricular  size is normal. Tricuspid regurgitation signal is inadequate for assessing  PA pressure.  3. The mitral valve is grossly normal. Trivial mitral valve  regurgitation. No evidence of mitral stenosis.  4. The aortic valve is tricuspid. Aortic valve regurgitation is trivial.  No aortic stenosis is present.  5. The inferior vena cava is normal in size with greater than 50%  respiratory variability, suggesting right atrial pressure of 3 mmHg.   Comparison(s): Compared to prior echo report in 2015, there is no  significant change.   Nuclear stress 08/11/20: . The left ventricular ejection fraction is mildly decreased (45-54%). . Nuclear stress EF:  50%. . There was no ST segment deviation noted during stress. . Findings consistent with prior myocardial infarction.  There is a medium size, fixed defect of moderate severity present in the basal anterior, mid anterior, apical anterior and apical (segment 17). Mild hypokinesis in this distribution. On the rotating images there appears to be breast shadowing in this same distribution, however cannot exclude a prior infarct. No ischemia on perfusion images.  This is an intermediate risk study concerning for anterior wall infarct.   )       Anesthesia Quick Evaluation

## 2020-10-20 NOTE — Progress Notes (Signed)
Anesthesia Chart Review:  Follows with cardiology for hx of nonobstructive CAD on cath in 2011-40% LAD, hypertension, HLD, OSA (does not wear CPAP), history of CVA about 10 yrs ago-no residual, history of pericardial effusion. Seen 08/02/20 for preop clearance. Per note, "Preoperative clearance before undergoing robotic distal gastrectomy for adenocarcinoma by Dr. Stark Klein.  Patient has chronic dyspnea on exertion that is unchanged no significant chest pain.  Her RCRI index is elevated 11%.  She is not active but METs are over 4.  With history of CAD, CVA, hypertension, HLD and CKD will proceed with Lexiscan Myoview before clearing for surgery. According to the Revised Cardiac Risk Index (RCRI), her Perioperative Risk of Major Cardiac Event is (%): 11. Her Functional Capacity in METs is: 4.64 according to the Duke Activity Status Index (DASI)." Nuclear stress was read as intermediate risk due to anterior defect. There was no ischemia on perfusion images. Ermalinda Barrios, PA-C commented on result stating, "Carlton Adam shows a fixed defect that is likely from breast shadowing. No ischemia. Please order echo to verify heart function and if all looks good shecan proceed with needed surgery." TTE was done 08/18/20 and Ermalinda Barrios, PA-C commented on result stating, "Echo shows normal heart function. Heart has trouble relaxing. She can becleared for surgery."  History of seizures, maintained on Keppra.   DM2 well-controlled, preop A1c 6.1.  Creatinine mildly elevated on preop labs 1.36, consistent with history of CKD 3.  Remainder of preop labs unremarkable.  Preop urinalysis abnormal, per epic, Dr. Barry Dienes has reviewed.  EKG 08/02/2020: NSR with PVC.  Rate 73.  Nonspecific ST changes.  Low voltage.  No changes.  TTE 08/18/20:  1. Difficult study due to poor acoustic windows. Left ventricular  ejection fraction, by estimation, is 60 to 65%. The left ventricle has  normal function. Based on limited views, the  left ventricle has no  regional wall motion abnormalities. Left  ventricular diastolic parameters are consistent with Grade I diastolic  dysfunction (impaired relaxation). Elevated left atrial pressure.   2. Right ventricular systolic function is normal. The right ventricular  size is normal. Tricuspid regurgitation signal is inadequate for assessing  PA pressure.   3. The mitral valve is grossly normal. Trivial mitral valve  regurgitation. No evidence of mitral stenosis.   4. The aortic valve is tricuspid. Aortic valve regurgitation is trivial.  No aortic stenosis is present.   5. The inferior vena cava is normal in size with greater than 50%  respiratory variability, suggesting right atrial pressure of 3 mmHg.   Comparison(s): Compared to prior echo report in 2015, there is no  significant change.   Nuclear stress 08/11/20: The left ventricular ejection fraction is mildly decreased (45-54%). Nuclear stress EF: 50%. There was no ST segment deviation noted during stress. Findings consistent with prior myocardial infarction.   There is a medium size, fixed defect of moderate severity present in the basal anterior, mid anterior, apical anterior and apical (segment 17).  Mild hypokinesis in this distribution. On the rotating images there appears to be breast shadowing in this same distribution, however cannot exclude a prior infarct. No ischemia on perfusion images.   This is an intermediate risk study concerning for anterior wall infarct.   Wynonia Musty [facility]/Anesthesiology Phone [phone] 10/20/2020 3:01 PM

## 2020-10-21 ENCOUNTER — Other Ambulatory Visit (HOSPITAL_COMMUNITY)
Admission: RE | Admit: 2020-10-21 | Discharge: 2020-10-21 | Disposition: A | Discharge status: Home / Self Care | Payer: Medicare PPO | Source: Ambulatory Visit | Attending: General Surgery | Admitting: General Surgery

## 2020-10-21 DIAGNOSIS — Z20822 Contact with and (suspected) exposure to covid-19: Secondary | ICD-10-CM | POA: Diagnosis not present

## 2020-10-21 DIAGNOSIS — Z01812 Encounter for preprocedural laboratory examination: Secondary | ICD-10-CM | POA: Diagnosis not present

## 2020-10-21 LAB — SARS CORONAVIRUS 2 (TAT 6-24 HRS): SARS Coronavirus 2: NEGATIVE

## 2020-10-24 ENCOUNTER — Other Ambulatory Visit: Payer: Self-pay | Admitting: Family Medicine

## 2020-10-24 DIAGNOSIS — M5442 Lumbago with sciatica, left side: Secondary | ICD-10-CM

## 2020-10-25 ENCOUNTER — Inpatient Hospital Stay (HOSPITAL_COMMUNITY)
Admission: RE | Admit: 2020-10-25 | Discharge: 2020-11-28 | DRG: 326 | Disposition: E | Payer: Medicare PPO | Attending: General Surgery | Admitting: General Surgery

## 2020-10-25 ENCOUNTER — Inpatient Hospital Stay (HOSPITAL_COMMUNITY): Payer: Medicare PPO | Admitting: Certified Registered Nurse Anesthetist

## 2020-10-25 ENCOUNTER — Encounter (HOSPITAL_COMMUNITY): Payer: Self-pay | Admitting: General Surgery

## 2020-10-25 ENCOUNTER — Inpatient Hospital Stay (HOSPITAL_COMMUNITY): Payer: Medicare PPO | Admitting: Physician Assistant

## 2020-10-25 ENCOUNTER — Other Ambulatory Visit: Payer: Self-pay

## 2020-10-25 ENCOUNTER — Inpatient Hospital Stay (HOSPITAL_COMMUNITY): Payer: Medicare PPO

## 2020-10-25 ENCOUNTER — Encounter (HOSPITAL_COMMUNITY): Admission: RE | Disposition: E | Payer: Self-pay | Source: Home / Self Care | Attending: General Surgery

## 2020-10-25 DIAGNOSIS — I2510 Atherosclerotic heart disease of native coronary artery without angina pectoris: Secondary | ICD-10-CM | POA: Diagnosis present

## 2020-10-25 DIAGNOSIS — I10 Essential (primary) hypertension: Secondary | ICD-10-CM | POA: Diagnosis not present

## 2020-10-25 DIAGNOSIS — E1122 Type 2 diabetes mellitus with diabetic chronic kidney disease: Secondary | ICD-10-CM | POA: Diagnosis present

## 2020-10-25 DIAGNOSIS — Z9889 Other specified postprocedural states: Secondary | ICD-10-CM

## 2020-10-25 DIAGNOSIS — I469 Cardiac arrest, cause unspecified: Secondary | ICD-10-CM | POA: Diagnosis not present

## 2020-10-25 DIAGNOSIS — I509 Heart failure, unspecified: Secondary | ICD-10-CM | POA: Diagnosis present

## 2020-10-25 DIAGNOSIS — E43 Unspecified severe protein-calorie malnutrition: Secondary | ICD-10-CM | POA: Diagnosis not present

## 2020-10-25 DIAGNOSIS — K56699 Other intestinal obstruction unspecified as to partial versus complete obstruction: Secondary | ICD-10-CM | POA: Diagnosis not present

## 2020-10-25 DIAGNOSIS — J80 Acute respiratory distress syndrome: Secondary | ICD-10-CM | POA: Diagnosis not present

## 2020-10-25 DIAGNOSIS — E1165 Type 2 diabetes mellitus with hyperglycemia: Secondary | ICD-10-CM | POA: Diagnosis present

## 2020-10-25 DIAGNOSIS — E039 Hypothyroidism, unspecified: Secondary | ICD-10-CM | POA: Diagnosis not present

## 2020-10-25 DIAGNOSIS — Z6841 Body Mass Index (BMI) 40.0 and over, adult: Secondary | ICD-10-CM | POA: Diagnosis not present

## 2020-10-25 DIAGNOSIS — Z0189 Encounter for other specified special examinations: Secondary | ICD-10-CM | POA: Diagnosis not present

## 2020-10-25 DIAGNOSIS — R1110 Vomiting, unspecified: Secondary | ICD-10-CM | POA: Diagnosis not present

## 2020-10-25 DIAGNOSIS — L89812 Pressure ulcer of head, stage 2: Secondary | ICD-10-CM | POA: Diagnosis not present

## 2020-10-25 DIAGNOSIS — R0603 Acute respiratory distress: Secondary | ICD-10-CM | POA: Diagnosis not present

## 2020-10-25 DIAGNOSIS — K219 Gastro-esophageal reflux disease without esophagitis: Secondary | ICD-10-CM | POA: Diagnosis present

## 2020-10-25 DIAGNOSIS — J309 Allergic rhinitis, unspecified: Secondary | ICD-10-CM | POA: Diagnosis present

## 2020-10-25 DIAGNOSIS — K567 Ileus, unspecified: Secondary | ICD-10-CM | POA: Diagnosis not present

## 2020-10-25 DIAGNOSIS — Z8673 Personal history of transient ischemic attack (TIA), and cerebral infarction without residual deficits: Secondary | ICD-10-CM

## 2020-10-25 DIAGNOSIS — K8689 Other specified diseases of pancreas: Secondary | ICD-10-CM | POA: Diagnosis not present

## 2020-10-25 DIAGNOSIS — J9690 Respiratory failure, unspecified, unspecified whether with hypoxia or hypercapnia: Secondary | ICD-10-CM | POA: Diagnosis not present

## 2020-10-25 DIAGNOSIS — Z9049 Acquired absence of other specified parts of digestive tract: Secondary | ICD-10-CM | POA: Diagnosis not present

## 2020-10-25 DIAGNOSIS — J690 Pneumonitis due to inhalation of food and vomit: Secondary | ICD-10-CM | POA: Diagnosis not present

## 2020-10-25 DIAGNOSIS — G9341 Metabolic encephalopathy: Secondary | ICD-10-CM | POA: Diagnosis not present

## 2020-10-25 DIAGNOSIS — M1990 Unspecified osteoarthritis, unspecified site: Secondary | ICD-10-CM | POA: Diagnosis present

## 2020-10-25 DIAGNOSIS — Z79891 Long term (current) use of opiate analgesic: Secondary | ICD-10-CM

## 2020-10-25 DIAGNOSIS — E870 Hyperosmolality and hypernatremia: Secondary | ICD-10-CM | POA: Diagnosis not present

## 2020-10-25 DIAGNOSIS — R112 Nausea with vomiting, unspecified: Secondary | ICD-10-CM | POA: Diagnosis not present

## 2020-10-25 DIAGNOSIS — L89892 Pressure ulcer of other site, stage 2: Secondary | ICD-10-CM | POA: Diagnosis not present

## 2020-10-25 DIAGNOSIS — G931 Anoxic brain damage, not elsewhere classified: Secondary | ICD-10-CM | POA: Diagnosis not present

## 2020-10-25 DIAGNOSIS — R1111 Vomiting without nausea: Secondary | ICD-10-CM | POA: Diagnosis not present

## 2020-10-25 DIAGNOSIS — Z66 Do not resuscitate: Secondary | ICD-10-CM | POA: Diagnosis not present

## 2020-10-25 DIAGNOSIS — R0902 Hypoxemia: Secondary | ICD-10-CM | POA: Diagnosis not present

## 2020-10-25 DIAGNOSIS — Z79890 Hormone replacement therapy (postmenopausal): Secondary | ICD-10-CM

## 2020-10-25 DIAGNOSIS — D72829 Elevated white blood cell count, unspecified: Secondary | ICD-10-CM

## 2020-10-25 DIAGNOSIS — E872 Acidosis: Secondary | ICD-10-CM | POA: Diagnosis present

## 2020-10-25 DIAGNOSIS — R4182 Altered mental status, unspecified: Secondary | ICD-10-CM | POA: Diagnosis not present

## 2020-10-25 DIAGNOSIS — E875 Hyperkalemia: Secondary | ICD-10-CM | POA: Diagnosis not present

## 2020-10-25 DIAGNOSIS — F32A Depression, unspecified: Secondary | ICD-10-CM | POA: Diagnosis present

## 2020-10-25 DIAGNOSIS — R0602 Shortness of breath: Secondary | ICD-10-CM

## 2020-10-25 DIAGNOSIS — I130 Hypertensive heart and chronic kidney disease with heart failure and stage 1 through stage 4 chronic kidney disease, or unspecified chronic kidney disease: Secondary | ICD-10-CM | POA: Diagnosis present

## 2020-10-25 DIAGNOSIS — Z96653 Presence of artificial knee joint, bilateral: Secondary | ICD-10-CM | POA: Diagnosis present

## 2020-10-25 DIAGNOSIS — A419 Sepsis, unspecified organism: Secondary | ICD-10-CM

## 2020-10-25 DIAGNOSIS — G40909 Epilepsy, unspecified, not intractable, without status epilepticus: Secondary | ICD-10-CM | POA: Diagnosis present

## 2020-10-25 DIAGNOSIS — N1831 Chronic kidney disease, stage 3a: Secondary | ICD-10-CM | POA: Diagnosis present

## 2020-10-25 DIAGNOSIS — E869 Volume depletion, unspecified: Secondary | ICD-10-CM | POA: Diagnosis not present

## 2020-10-25 DIAGNOSIS — N170 Acute kidney failure with tubular necrosis: Secondary | ICD-10-CM | POA: Diagnosis not present

## 2020-10-25 DIAGNOSIS — J019 Acute sinusitis, unspecified: Secondary | ICD-10-CM | POA: Diagnosis not present

## 2020-10-25 DIAGNOSIS — R5381 Other malaise: Secondary | ICD-10-CM | POA: Diagnosis not present

## 2020-10-25 DIAGNOSIS — I517 Cardiomegaly: Secondary | ICD-10-CM | POA: Diagnosis not present

## 2020-10-25 DIAGNOSIS — R061 Stridor: Secondary | ICD-10-CM | POA: Diagnosis not present

## 2020-10-25 DIAGNOSIS — R933 Abnormal findings on diagnostic imaging of other parts of digestive tract: Secondary | ICD-10-CM | POA: Diagnosis not present

## 2020-10-25 DIAGNOSIS — K828 Other specified diseases of gallbladder: Secondary | ICD-10-CM | POA: Diagnosis not present

## 2020-10-25 DIAGNOSIS — C163 Malignant neoplasm of pyloric antrum: Principal | ICD-10-CM | POA: Diagnosis present

## 2020-10-25 DIAGNOSIS — F419 Anxiety disorder, unspecified: Secondary | ICD-10-CM | POA: Diagnosis present

## 2020-10-25 DIAGNOSIS — Z95810 Presence of automatic (implantable) cardiac defibrillator: Secondary | ICD-10-CM | POA: Diagnosis not present

## 2020-10-25 DIAGNOSIS — E871 Hypo-osmolality and hyponatremia: Secondary | ICD-10-CM | POA: Diagnosis present

## 2020-10-25 DIAGNOSIS — Z515 Encounter for palliative care: Secondary | ICD-10-CM | POA: Diagnosis not present

## 2020-10-25 DIAGNOSIS — Z903 Acquired absence of stomach [part of]: Secondary | ICD-10-CM

## 2020-10-25 DIAGNOSIS — Z452 Encounter for adjustment and management of vascular access device: Secondary | ICD-10-CM

## 2020-10-25 DIAGNOSIS — Z8249 Family history of ischemic heart disease and other diseases of the circulatory system: Secondary | ICD-10-CM

## 2020-10-25 DIAGNOSIS — J9811 Atelectasis: Secondary | ICD-10-CM | POA: Diagnosis not present

## 2020-10-25 DIAGNOSIS — E876 Hypokalemia: Secondary | ICD-10-CM | POA: Diagnosis not present

## 2020-10-25 DIAGNOSIS — Z79899 Other long term (current) drug therapy: Secondary | ICD-10-CM

## 2020-10-25 DIAGNOSIS — K449 Diaphragmatic hernia without obstruction or gangrene: Secondary | ICD-10-CM | POA: Diagnosis not present

## 2020-10-25 DIAGNOSIS — Z885 Allergy status to narcotic agent status: Secondary | ICD-10-CM

## 2020-10-25 DIAGNOSIS — Z7984 Long term (current) use of oral hypoglycemic drugs: Secondary | ICD-10-CM

## 2020-10-25 DIAGNOSIS — E785 Hyperlipidemia, unspecified: Secondary | ICD-10-CM | POA: Diagnosis present

## 2020-10-25 DIAGNOSIS — R918 Other nonspecific abnormal finding of lung field: Secondary | ICD-10-CM | POA: Diagnosis not present

## 2020-10-25 DIAGNOSIS — Z9289 Personal history of other medical treatment: Secondary | ICD-10-CM

## 2020-10-25 DIAGNOSIS — Z4682 Encounter for fitting and adjustment of non-vascular catheter: Secondary | ICD-10-CM | POA: Diagnosis not present

## 2020-10-25 DIAGNOSIS — J811 Chronic pulmonary edema: Secondary | ICD-10-CM | POA: Diagnosis not present

## 2020-10-25 DIAGNOSIS — J9601 Acute respiratory failure with hypoxia: Secondary | ICD-10-CM | POA: Diagnosis not present

## 2020-10-25 DIAGNOSIS — K5939 Other megacolon: Secondary | ICD-10-CM | POA: Diagnosis not present

## 2020-10-25 DIAGNOSIS — G4733 Obstructive sleep apnea (adult) (pediatric): Secondary | ICD-10-CM | POA: Diagnosis not present

## 2020-10-25 DIAGNOSIS — E1143 Type 2 diabetes mellitus with diabetic autonomic (poly)neuropathy: Secondary | ICD-10-CM | POA: Diagnosis not present

## 2020-10-25 DIAGNOSIS — L89152 Pressure ulcer of sacral region, stage 2: Secondary | ICD-10-CM | POA: Diagnosis not present

## 2020-10-25 DIAGNOSIS — C169 Malignant neoplasm of stomach, unspecified: Secondary | ICD-10-CM | POA: Diagnosis not present

## 2020-10-25 DIAGNOSIS — J90 Pleural effusion, not elsewhere classified: Secondary | ICD-10-CM | POA: Diagnosis not present

## 2020-10-25 DIAGNOSIS — Z880 Allergy status to penicillin: Secondary | ICD-10-CM

## 2020-10-25 DIAGNOSIS — E1140 Type 2 diabetes mellitus with diabetic neuropathy, unspecified: Secondary | ICD-10-CM | POA: Diagnosis not present

## 2020-10-25 DIAGNOSIS — R6521 Severe sepsis with septic shock: Secondary | ICD-10-CM | POA: Diagnosis not present

## 2020-10-25 DIAGNOSIS — I639 Cerebral infarction, unspecified: Secondary | ICD-10-CM | POA: Diagnosis not present

## 2020-10-25 DIAGNOSIS — R0682 Tachypnea, not elsewhere classified: Secondary | ICD-10-CM

## 2020-10-25 DIAGNOSIS — K6389 Other specified diseases of intestine: Secondary | ICD-10-CM | POA: Diagnosis not present

## 2020-10-25 DIAGNOSIS — Z4659 Encounter for fitting and adjustment of other gastrointestinal appliance and device: Secondary | ICD-10-CM

## 2020-10-25 DIAGNOSIS — Z9911 Dependence on respirator [ventilator] status: Secondary | ICD-10-CM | POA: Diagnosis not present

## 2020-10-25 DIAGNOSIS — N281 Cyst of kidney, acquired: Secondary | ICD-10-CM | POA: Diagnosis not present

## 2020-10-25 DIAGNOSIS — Z833 Family history of diabetes mellitus: Secondary | ICD-10-CM

## 2020-10-25 DIAGNOSIS — L8990 Pressure ulcer of unspecified site, unspecified stage: Secondary | ICD-10-CM | POA: Insufficient documentation

## 2020-10-25 DIAGNOSIS — R1310 Dysphagia, unspecified: Secondary | ICD-10-CM | POA: Diagnosis present

## 2020-10-25 DIAGNOSIS — I248 Other forms of acute ischemic heart disease: Secondary | ICD-10-CM | POA: Diagnosis not present

## 2020-10-25 DIAGNOSIS — K56691 Other complete intestinal obstruction: Secondary | ICD-10-CM | POA: Diagnosis not present

## 2020-10-25 DIAGNOSIS — R7989 Other specified abnormal findings of blood chemistry: Secondary | ICD-10-CM | POA: Diagnosis not present

## 2020-10-25 DIAGNOSIS — M47816 Spondylosis without myelopathy or radiculopathy, lumbar region: Secondary | ICD-10-CM | POA: Diagnosis not present

## 2020-10-25 DIAGNOSIS — K3184 Gastroparesis: Secondary | ICD-10-CM | POA: Diagnosis not present

## 2020-10-25 DIAGNOSIS — R569 Unspecified convulsions: Secondary | ICD-10-CM | POA: Diagnosis not present

## 2020-10-25 DIAGNOSIS — Z7189 Other specified counseling: Secondary | ICD-10-CM | POA: Diagnosis not present

## 2020-10-25 DIAGNOSIS — E878 Other disorders of electrolyte and fluid balance, not elsewhere classified: Secondary | ICD-10-CM | POA: Diagnosis not present

## 2020-10-25 DIAGNOSIS — J984 Other disorders of lung: Secondary | ICD-10-CM | POA: Diagnosis not present

## 2020-10-25 DIAGNOSIS — K2950 Unspecified chronic gastritis without bleeding: Secondary | ICD-10-CM | POA: Diagnosis present

## 2020-10-25 DIAGNOSIS — D539 Nutritional anemia, unspecified: Secondary | ICD-10-CM | POA: Diagnosis not present

## 2020-10-25 DIAGNOSIS — Z90710 Acquired absence of both cervix and uterus: Secondary | ICD-10-CM

## 2020-10-25 DIAGNOSIS — Z978 Presence of other specified devices: Secondary | ICD-10-CM

## 2020-10-25 DIAGNOSIS — N179 Acute kidney failure, unspecified: Secondary | ICD-10-CM | POA: Diagnosis not present

## 2020-10-25 LAB — CBC
HCT: 43 % (ref 36.0–46.0)
Hemoglobin: 13.4 g/dL (ref 12.0–15.0)
MCH: 28.9 pg (ref 26.0–34.0)
MCHC: 31.2 g/dL (ref 30.0–36.0)
MCV: 92.7 fL (ref 80.0–100.0)
Platelets: 200 10*3/uL (ref 150–400)
RBC: 4.64 MIL/uL (ref 3.87–5.11)
RDW: 14 % (ref 11.5–15.5)
WBC: 21.2 10*3/uL — ABNORMAL HIGH (ref 4.0–10.5)
nRBC: 0 % (ref 0.0–0.2)

## 2020-10-25 LAB — GLUCOSE, CAPILLARY
Glucose-Capillary: 114 mg/dL — ABNORMAL HIGH (ref 70–99)
Glucose-Capillary: 126 mg/dL — ABNORMAL HIGH (ref 70–99)
Glucose-Capillary: 192 mg/dL — ABNORMAL HIGH (ref 70–99)
Glucose-Capillary: 199 mg/dL — ABNORMAL HIGH (ref 70–99)
Glucose-Capillary: 176 mg/dL — ABNORMAL HIGH (ref 70–99)

## 2020-10-25 LAB — CREATININE, SERUM
Creatinine, Ser: 1.28 mg/dL — ABNORMAL HIGH (ref 0.44–1.00)
GFR, Estimated: 43 mL/min — ABNORMAL LOW (ref >60)

## 2020-10-25 SURGERY — FUNDOPLICATION, NISSEN, ROBOT-ASSISTED, LAPAROSCOPIC
Anesthesia: General | Site: Abdomen

## 2020-10-25 MED ORDER — HYDROMORPHONE HCL 1 MG/ML IJ SOLN
0.5000 mg | INTRAMUSCULAR | Status: DC | PRN
  Administered 2020-10-25: 1 mg via INTRAVENOUS
  Administered 2020-10-26: 0.5 mg via INTRAVENOUS
  Administered 2020-10-26 – 2020-10-30 (×13): 1 mg via INTRAVENOUS
  Filled 2020-10-25 (×15): qty 1

## 2020-10-25 MED ORDER — ONDANSETRON HCL 4 MG/2ML IJ SOLN
INTRAMUSCULAR | Status: AC
  Filled 2020-10-25: qty 2

## 2020-10-25 MED ORDER — PROCHLORPERAZINE EDISYLATE 10 MG/2ML IJ SOLN
5.0000 mg | Freq: Four times a day (QID) | INTRAMUSCULAR | Status: DC | PRN
  Administered 2020-10-28: 5 mg via INTRAVENOUS
  Administered 2020-10-29: 10 mg via INTRAVENOUS
  Administered 2020-10-29: 5 mg via INTRAVENOUS
  Administered 2020-10-30 – 2020-11-01 (×4): 10 mg via INTRAVENOUS
  Filled 2020-10-25 (×9): qty 2

## 2020-10-25 MED ORDER — CHLORHEXIDINE GLUCONATE CLOTH 2 % EX PADS: 6.0000 | MEDICATED_PAD | Freq: Once | CUTANEOUS | Status: DC

## 2020-10-25 MED ORDER — BUPIVACAINE LIPOSOME 1.3 % IJ SUSP
INTRAMUSCULAR | Status: AC
  Filled 2020-10-25: qty 20

## 2020-10-25 MED ORDER — SUGAMMADEX SODIUM 200 MG/2ML IV SOLN
INTRAVENOUS | Status: DC | PRN
  Administered 2020-10-25: 200 mg via INTRAVENOUS

## 2020-10-25 MED ORDER — PHENYLEPHRINE HCL-NACL 10-0.9 MG/250ML-% IV SOLN
INTRAVENOUS | Status: AC
  Filled 2020-10-25: qty 500

## 2020-10-25 MED ORDER — LIDOCAINE 2% (20 MG/ML) 5 ML SYRINGE
INTRAMUSCULAR | Status: DC | PRN
  Administered 2020-10-25: 100 mg via INTRAVENOUS

## 2020-10-25 MED ORDER — LIDOCAINE-EPINEPHRINE 2 %-1:100000 IJ SOLN
INTRAMUSCULAR | Status: AC
  Filled 2020-10-25: qty 1

## 2020-10-25 MED ORDER — CIPROFLOXACIN IN D5W 400 MG/200ML IV SOLN
400.0000 mg | Freq: Two times a day (BID) | INTRAVENOUS | Status: AC
  Administered 2020-10-25: 400 mg via INTRAVENOUS
  Filled 2020-10-25: qty 200

## 2020-10-25 MED ORDER — PHENYLEPHRINE HCL-NACL 10-0.9 MG/250ML-% IV SOLN
INTRAVENOUS | Status: DC | PRN
  Administered 2020-10-25: 20 ug/min via INTRAVENOUS

## 2020-10-25 MED ORDER — HYDROMORPHONE HCL 1 MG/ML IJ SOLN: 0.2500 mg | INTRAMUSCULAR | Status: DC | PRN

## 2020-10-25 MED ORDER — CHLORHEXIDINE GLUCONATE CLOTH 2 % EX PADS
6.0000 | MEDICATED_PAD | Freq: Every day | CUTANEOUS | Status: DC
  Administered 2020-10-25 – 2020-10-26 (×2): 6 via TOPICAL

## 2020-10-25 MED ORDER — DEXAMETHASONE SODIUM PHOSPHATE 10 MG/ML IJ SOLN
INTRAMUSCULAR | Status: DC | PRN
  Administered 2020-10-25: 5 mg via INTRAVENOUS

## 2020-10-25 MED ORDER — HYDRALAZINE HCL 20 MG/ML IJ SOLN
10.0000 mg | INTRAMUSCULAR | Status: DC | PRN
  Administered 2020-10-29 – 2020-10-30 (×2): 10 mg via INTRAVENOUS
  Filled 2020-10-25 (×2): qty 1

## 2020-10-25 MED ORDER — LACTATED RINGERS IV SOLN: INTRAVENOUS | Status: DC | PRN

## 2020-10-25 MED ORDER — ROCURONIUM BROMIDE 10 MG/ML (PF) SYRINGE
PREFILLED_SYRINGE | INTRAVENOUS | Status: DC | PRN
  Administered 2020-10-25: 20 mg via INTRAVENOUS
  Administered 2020-10-25: 60 mg via INTRAVENOUS
  Administered 2020-10-25: 20 mg via INTRAVENOUS

## 2020-10-25 MED ORDER — METHOCARBAMOL 1000 MG/10ML IJ SOLN
500.0000 mg | Freq: Four times a day (QID) | INTRAVENOUS | Status: DC | PRN
  Filled 2020-10-25 (×2): qty 5

## 2020-10-25 MED ORDER — DIPHENHYDRAMINE HCL 12.5 MG/5ML PO ELIX: 12.5000 mg | ORAL_SOLUTION | Freq: Four times a day (QID) | ORAL | Status: DC | PRN

## 2020-10-25 MED ORDER — KCL IN DEXTROSE-NACL 20-5-0.45 MEQ/L-%-% IV SOLN
INTRAVENOUS | Status: DC
  Filled 2020-10-25 (×6): qty 1000

## 2020-10-25 MED ORDER — ROCURONIUM BROMIDE 10 MG/ML (PF) SYRINGE
PREFILLED_SYRINGE | INTRAVENOUS | Status: AC
  Filled 2020-10-25: qty 10

## 2020-10-25 MED ORDER — PROCHLORPERAZINE MALEATE 10 MG PO TABS
10.0000 mg | ORAL_TABLET | Freq: Four times a day (QID) | ORAL | Status: DC | PRN
  Filled 2020-10-25: qty 1

## 2020-10-25 MED ORDER — ORAL CARE MOUTH RINSE: 15.0000 mL | Freq: Once | OROMUCOSAL | Status: AC

## 2020-10-25 MED ORDER — LORAZEPAM 2 MG/ML IJ SOLN: 0.5000 mg | Freq: Four times a day (QID) | INTRAMUSCULAR | Status: DC | PRN

## 2020-10-25 MED ORDER — CIPROFLOXACIN IN D5W 400 MG/200ML IV SOLN
400.0000 mg | INTRAVENOUS | Status: AC
  Administered 2020-10-25: 400 mg via INTRAVENOUS
  Filled 2020-10-25: qty 200

## 2020-10-25 MED ORDER — LACTATED RINGERS IV SOLN: INTRAVENOUS | Status: DC

## 2020-10-25 MED ORDER — LIDOCAINE 2% (20 MG/ML) 5 ML SYRINGE
INTRAMUSCULAR | Status: AC
  Filled 2020-10-25: qty 5

## 2020-10-25 MED ORDER — PHENYLEPHRINE 40 MCG/ML (10ML) SYRINGE FOR IV PUSH (FOR BLOOD PRESSURE SUPPORT)
PREFILLED_SYRINGE | INTRAVENOUS | Status: DC | PRN
  Administered 2020-10-25: 80 ug via INTRAVENOUS
  Administered 2020-10-25: 40 ug via INTRAVENOUS

## 2020-10-25 MED ORDER — ENOXAPARIN SODIUM 40 MG/0.4ML IJ SOSY
40.0000 mg | PREFILLED_SYRINGE | INTRAMUSCULAR | Status: DC
  Administered 2020-10-26 – 2020-11-15 (×20): 40 mg via SUBCUTANEOUS
  Filled 2020-10-25 (×21): qty 0.4

## 2020-10-25 MED ORDER — FENTANYL CITRATE (PF) 250 MCG/5ML IJ SOLN
INTRAMUSCULAR | Status: AC
  Filled 2020-10-25: qty 5

## 2020-10-25 MED ORDER — LIDOCAINE-EPINEPHRINE 1 %-1:100000 IJ SOLN
INTRAMUSCULAR | Status: DC | PRN
  Administered 2020-10-25: 13 mL

## 2020-10-25 MED ORDER — ONDANSETRON 4 MG PO TBDP
4.0000 mg | ORAL_TABLET | Freq: Four times a day (QID) | ORAL | Status: DC | PRN
  Filled 2020-10-25: qty 1

## 2020-10-25 MED ORDER — DIPHENHYDRAMINE HCL 50 MG/ML IJ SOLN
12.5000 mg | Freq: Four times a day (QID) | INTRAMUSCULAR | Status: DC | PRN
  Administered 2020-11-02: 12.5 mg via INTRAVENOUS
  Filled 2020-10-25: qty 1

## 2020-10-25 MED ORDER — LEVETIRACETAM IN NACL 500 MG/100ML IV SOLN
500.0000 mg | Freq: Two times a day (BID) | INTRAVENOUS | Status: DC
  Administered 2020-10-25 – 2020-10-28 (×6): 500 mg via INTRAVENOUS
  Filled 2020-10-25 (×7): qty 100

## 2020-10-25 MED ORDER — FENTANYL CITRATE (PF) 250 MCG/5ML IJ SOLN
INTRAMUSCULAR | Status: DC | PRN
  Administered 2020-10-25: 50 ug via INTRAVENOUS
  Administered 2020-10-25: 25 ug via INTRAVENOUS
  Administered 2020-10-25: 100 ug via INTRAVENOUS
  Administered 2020-10-25: 50 ug via INTRAVENOUS
  Administered 2020-10-25: 25 ug via INTRAVENOUS

## 2020-10-25 MED ORDER — CHLORHEXIDINE GLUCONATE 0.12 % MT SOLN
15.0000 mL | Freq: Once | OROMUCOSAL | Status: AC
  Administered 2020-10-25: 15 mL via OROMUCOSAL
  Filled 2020-10-25: qty 15

## 2020-10-25 MED ORDER — LEVOTHYROXINE SODIUM 100 MCG/5ML IV SOLN
75.0000 ug | Freq: Every day | INTRAVENOUS | Status: DC
  Administered 2020-10-28: 75 ug via INTRAVENOUS
  Filled 2020-10-25: qty 5

## 2020-10-25 MED ORDER — PANTOPRAZOLE SODIUM 40 MG IV SOLR
40.0000 mg | Freq: Every day | INTRAVENOUS | Status: DC
  Administered 2020-10-25 – 2020-10-27 (×3): 40 mg via INTRAVENOUS
  Filled 2020-10-25 (×3): qty 40

## 2020-10-25 MED ORDER — PROMETHAZINE HCL 25 MG/ML IJ SOLN: 6.2500 mg | INTRAMUSCULAR | Status: DC | PRN

## 2020-10-25 MED ORDER — DEXAMETHASONE SODIUM PHOSPHATE 10 MG/ML IJ SOLN
INTRAMUSCULAR | Status: AC
  Filled 2020-10-25: qty 1

## 2020-10-25 MED ORDER — BUPIVACAINE HCL (PF) 0.25 % IJ SOLN
INTRAMUSCULAR | Status: AC
  Filled 2020-10-25: qty 30

## 2020-10-25 MED ORDER — INSULIN ASPART 100 UNIT/ML IJ SOLN
0.0000 [IU] | Freq: Three times a day (TID) | INTRAMUSCULAR | Status: DC
  Administered 2020-10-25: 3 [IU] via SUBCUTANEOUS
  Administered 2020-10-26 (×2): 2 [IU] via SUBCUTANEOUS
  Administered 2020-10-26: 3 [IU] via SUBCUTANEOUS
  Administered 2020-10-27 (×2): 2 [IU] via SUBCUTANEOUS
  Administered 2020-10-28 (×2): 3 [IU] via SUBCUTANEOUS
  Administered 2020-10-28: 8 [IU] via SUBCUTANEOUS
  Administered 2020-10-29 – 2020-10-30 (×6): 3 [IU] via SUBCUTANEOUS
  Administered 2020-10-31 – 2020-11-01 (×6): 2 [IU] via SUBCUTANEOUS
  Administered 2020-11-02: 5 [IU] via SUBCUTANEOUS
  Administered 2020-11-02 (×2): 3 [IU] via SUBCUTANEOUS
  Administered 2020-11-03: 5 [IU] via SUBCUTANEOUS
  Administered 2020-11-03: 2 [IU] via SUBCUTANEOUS
  Administered 2020-11-03: 5 [IU] via SUBCUTANEOUS

## 2020-10-25 MED ORDER — ACETAMINOPHEN 10 MG/ML IV SOLN
1000.0000 mg | Freq: Four times a day (QID) | INTRAVENOUS | Status: AC
  Administered 2020-10-25 – 2020-10-26 (×4): 1000 mg via INTRAVENOUS
  Filled 2020-10-25 (×4): qty 100

## 2020-10-25 MED ORDER — PHENYLEPHRINE 40 MCG/ML (10ML) SYRINGE FOR IV PUSH (FOR BLOOD PRESSURE SUPPORT)
PREFILLED_SYRINGE | INTRAVENOUS | Status: AC
  Filled 2020-10-25: qty 10

## 2020-10-25 MED ORDER — PROPOFOL 10 MG/ML IV BOLUS
INTRAVENOUS | Status: DC | PRN
  Administered 2020-10-25: 130 mg via INTRAVENOUS
  Administered 2020-10-25: 20 mg via INTRAVENOUS

## 2020-10-25 MED ORDER — PROPOFOL 10 MG/ML IV BOLUS
INTRAVENOUS | Status: AC
  Filled 2020-10-25: qty 40

## 2020-10-25 MED ORDER — BUPIVACAINE LIPOSOME 1.3 % IJ SUSP
INTRAMUSCULAR | Status: DC | PRN
  Administered 2020-10-25: 20 mL

## 2020-10-25 MED ORDER — LIDOCAINE-EPINEPHRINE 1 %-1:100000 IJ SOLN
INTRAMUSCULAR | Status: AC
  Filled 2020-10-25: qty 1

## 2020-10-25 MED ORDER — BUPIVACAINE HCL (PF) 0.25 % IJ SOLN
INTRAMUSCULAR | Status: DC | PRN
  Administered 2020-10-25: 13 mL

## 2020-10-25 MED ORDER — ONDANSETRON HCL 4 MG/2ML IJ SOLN
4.0000 mg | Freq: Four times a day (QID) | INTRAMUSCULAR | Status: DC | PRN
  Administered 2020-10-27 – 2020-11-02 (×8): 4 mg via INTRAVENOUS
  Filled 2020-10-25 (×9): qty 2

## 2020-10-25 MED ORDER — METOPROLOL TARTRATE 5 MG/5ML IV SOLN
5.0000 mg | Freq: Four times a day (QID) | INTRAVENOUS | Status: DC
  Administered 2020-10-25 – 2020-10-28 (×7): 5 mg via INTRAVENOUS
  Filled 2020-10-25 (×9): qty 5

## 2020-10-25 MED ORDER — ACETAMINOPHEN 500 MG PO TABS
1000.0000 mg | ORAL_TABLET | ORAL | Status: AC
  Administered 2020-10-25: 1000 mg via ORAL
  Filled 2020-10-25: qty 2

## 2020-10-25 MED ORDER — ONDANSETRON HCL 4 MG/2ML IJ SOLN
INTRAMUSCULAR | Status: DC | PRN
  Administered 2020-10-25: 4 mg via INTRAVENOUS

## 2020-10-25 SURGICAL SUPPLY — 145 items
ADH SKN CLS APL DERMABOND .7 (GAUZE/BANDAGES/DRESSINGS) ×1
APL PRP STRL LF DISP 70% ISPRP (MISCELLANEOUS) ×2
BAG SPEC RTRVL 10 TROC 200 (ENDOMECHANICALS) ×1
BIOPATCH RED 1 DISK 7.0 (GAUZE/BANDAGES/DRESSINGS) IMPLANT
BLADE CLIPPER SURG (BLADE) IMPLANT
BLADE EXTENDED COATED 6.5IN (ELECTRODE) IMPLANT
CANISTER SUCT 3000ML PPV (MISCELLANEOUS) ×2 IMPLANT
CANNULA REDUC XI 12-8 STAPL (CANNULA) ×2
CANNULA REDUCER 12-8 DVNC XI (CANNULA) ×1 IMPLANT
CATH KIT ON-Q SILVERSOAK 7.5 (CATHETERS) IMPLANT
CATH KIT ON-Q SILVERSOAK 7.5IN (CATHETERS) IMPLANT
CELLS DAT CNTRL 66122 CELL SVR (MISCELLANEOUS) IMPLANT
CHLORAPREP W/TINT 26 (MISCELLANEOUS) ×4 IMPLANT
CLIP VESOCCLUDE LG 6/CT (CLIP) IMPLANT
CLIP VESOCCLUDE MED 24/CT (CLIP) IMPLANT
CLIP VESOLOCK LG 6/CT PURPLE (CLIP) IMPLANT
CLIP VESOLOCK MED 6/CT (CLIP) IMPLANT
CLIP VESOLOCK MED LG 6/CT (CLIP) IMPLANT
COVER MAYO STAND STRL (DRAPES) ×2 IMPLANT
COVER SURGICAL LIGHT HANDLE (MISCELLANEOUS) ×2 IMPLANT
COVER TIP SHEARS 8 DVNC (MISCELLANEOUS) IMPLANT
COVER TIP SHEARS 8MM DA VINCI (MISCELLANEOUS)
COVER WAND RF STERILE (DRAPES) ×2 IMPLANT
DECANTER SPIKE VIAL GLASS SM (MISCELLANEOUS) ×2 IMPLANT
DEFOGGER SCOPE WARMER CLEARIFY (MISCELLANEOUS) ×2 IMPLANT
DERMABOND ADVANCED (GAUZE/BANDAGES/DRESSINGS) ×1
DERMABOND ADVANCED .7 DNX12 (GAUZE/BANDAGES/DRESSINGS) ×1 IMPLANT
DEVICE TROCAR PUNCTURE CLOSURE (ENDOMECHANICALS) IMPLANT
DRAIN CHANNEL 19F RND (DRAIN) IMPLANT
DRAIN PENROSE 0.5X18 (DRAIN) IMPLANT
DRAPE ARM DVNC X/XI (DISPOSABLE) ×4 IMPLANT
DRAPE COLUMN DVNC XI (DISPOSABLE) ×1 IMPLANT
DRAPE CV SPLIT W-CLR ANES SCRN (DRAPES) ×2 IMPLANT
DRAPE DA VINCI XI ARM (DISPOSABLE) ×8
DRAPE DA VINCI XI COLUMN (DISPOSABLE) ×2
DRAPE LAPAROSCOPIC ABDOMINAL (DRAPES) ×1 IMPLANT
DRAPE ORTHO SPLIT 77X108 STRL (DRAPES) ×2
DRAPE SURG IRRIG POUCH 19X23 (DRAPES) ×2 IMPLANT
DRAPE SURG ORHT 6 SPLT 77X108 (DRAPES) ×1 IMPLANT
DRAPE UTILITY XL STRL (DRAPES) IMPLANT
DRAPE WARM FLUID 44X44 (DRAPES) ×1 IMPLANT
DRESSING MEPILEX FLEX 4X4 (GAUZE/BANDAGES/DRESSINGS) IMPLANT
DRSG COVADERM 4X10 (GAUZE/BANDAGES/DRESSINGS) IMPLANT
DRSG COVADERM 4X8 (GAUZE/BANDAGES/DRESSINGS) IMPLANT
DRSG MEPILEX FLEX 4X4 (GAUZE/BANDAGES/DRESSINGS) ×2
DRSG OPSITE POSTOP 4X6 (GAUZE/BANDAGES/DRESSINGS) IMPLANT
DRSG OPSITE POSTOP 4X8 (GAUZE/BANDAGES/DRESSINGS) IMPLANT
DRSG TEGADERM 4X4.75 (GAUZE/BANDAGES/DRESSINGS) IMPLANT
ELECT BLADE 6.5 EXT (BLADE) ×2 IMPLANT
ELECT REM PT RETURN 9FT ADLT (ELECTROSURGICAL) ×4
ELECTRODE REM PT RTRN 9FT ADLT (ELECTROSURGICAL) ×2 IMPLANT
ENDOLOOP SUT PDS II  0 18 (SUTURE)
ENDOLOOP SUT PDS II 0 18 (SUTURE) IMPLANT
EVACUATOR SILICONE 100CC (DRAIN) IMPLANT
GAUZE 4X4 16PLY ~~LOC~~+RFID DBL (SPONGE) ×1 IMPLANT
GAUZE SPONGE 4X4 12PLY STRL (GAUZE/BANDAGES/DRESSINGS) ×1 IMPLANT
GLOVE SURG ENC MOIS LTX SZ6 (GLOVE) ×8 IMPLANT
GLOVE SURG UNDER LTX SZ6.5 (GLOVE) ×8 IMPLANT
GOWN STRL REUS W/ TWL LRG LVL3 (GOWN DISPOSABLE) ×4 IMPLANT
GOWN STRL REUS W/ TWL XL LVL3 (GOWN DISPOSABLE) ×2 IMPLANT
GOWN STRL REUS W/TWL 2XL LVL3 (GOWN DISPOSABLE) ×8 IMPLANT
GOWN STRL REUS W/TWL LRG LVL3 (GOWN DISPOSABLE) ×8
GOWN STRL REUS W/TWL XL LVL3 (GOWN DISPOSABLE) ×4
KIT BASIN OR (CUSTOM PROCEDURE TRAY) ×4 IMPLANT
KIT TUBE JEJUNAL 16FR (CATHETERS) IMPLANT
KIT TURNOVER KIT B (KITS) ×2 IMPLANT
L-HOOK LAP DISP 36CM (ELECTROSURGICAL)
LHOOK LAP DISP 36CM (ELECTROSURGICAL) IMPLANT
NDL INSUFFLATION 14GA 120MM (NEEDLE) ×1 IMPLANT
NEEDLE 22X1 1/2 (OR ONLY) (NEEDLE) ×2 IMPLANT
NEEDLE HYPO 22GX1.5 SAFETY (NEEDLE) ×2 IMPLANT
NEEDLE INSUFFLATION 14GA 120MM (NEEDLE) ×2 IMPLANT
NS IRRIG 1000ML POUR BTL (IV SOLUTION) ×4 IMPLANT
OBTURATOR OPTICAL STANDARD 8MM (TROCAR)
OBTURATOR OPTICAL STND 8 DVNC (TROCAR)
OBTURATOR OPTICALSTD 8 DVNC (TROCAR) IMPLANT
PACK GENERAL/GYN (CUSTOM PROCEDURE TRAY) ×2 IMPLANT
PAD ARMBOARD 7.5X6 YLW CONV (MISCELLANEOUS) ×6 IMPLANT
PENCIL SMOKE EVACUATOR (MISCELLANEOUS) ×2 IMPLANT
PORT LAP GEL ALEXIS MED 5-9CM (MISCELLANEOUS) IMPLANT
POUCH RETRIEVAL ECOSAC 10 (ENDOMECHANICALS) ×1 IMPLANT
POUCH RETRIEVAL ECOSAC 10MM (ENDOMECHANICALS) ×2
RELOAD STAPLE 60 3.5 BLU DVNC (STAPLE) IMPLANT
RELOAD STAPLE 60 4.3 GRN DVNC (STAPLE) IMPLANT
RELOAD STAPLER 3.5X60 BLU DVNC (STAPLE) ×3 IMPLANT
RELOAD STAPLER 4.3X60 GRN DVNC (STAPLE) ×2 IMPLANT
RETRACTOR WND ALEXIS 18 MED (MISCELLANEOUS) IMPLANT
RTRCTR WOUND ALEXIS 18CM MED (MISCELLANEOUS)
SCISSORS LAP 5X35 DISP (ENDOMECHANICALS) ×1 IMPLANT
SEAL CANN UNIV 5-8 DVNC XI (MISCELLANEOUS) ×3 IMPLANT
SEAL XI 5MM-8MM UNIVERSAL (MISCELLANEOUS) ×6
SEALER VESSEL DA VINCI XI (MISCELLANEOUS) ×2
SEALER VESSEL EXT DVNC XI (MISCELLANEOUS) ×1 IMPLANT
SET IRRIG TUBING LAPAROSCOPIC (IRRIGATION / IRRIGATOR) ×2 IMPLANT
SET TUBE SMOKE EVAC HIGH FLOW (TUBING) ×2 IMPLANT
SHEARS FOC LG CVD HARMONIC 17C (MISCELLANEOUS) IMPLANT
SLEEVE SUCTION CATH 165 (SLEEVE) ×2 IMPLANT
SLEEVE XCEL OPT CAN 5 100 (ENDOMECHANICALS) IMPLANT
SPECIMEN JAR LARGE (MISCELLANEOUS) ×2 IMPLANT
SPONGE LAP 18X18 RF (DISPOSABLE) IMPLANT
SPONGE T-LAP 18X18 ~~LOC~~+RFID (SPONGE) ×1 IMPLANT
STAPLE ECHEON FLEX 60 POW ENDO (STAPLE) IMPLANT
STAPLER 60 DA VINCI SURE FORM (STAPLE) ×2
STAPLER 60 SUREFORM DVNC (STAPLE) ×1 IMPLANT
STAPLER CANNULA SEAL DVNC XI (STAPLE) IMPLANT
STAPLER CANNULA SEAL XI (STAPLE)
STAPLER RELOAD 3.5X60 BLU DVNC (STAPLE) ×3
STAPLER RELOAD 3.5X60 BLUE (STAPLE) ×6
STAPLER RELOAD 4.3X60 GREEN (STAPLE) ×4
STAPLER RELOAD 4.3X60 GRN DVNC (STAPLE) ×2
STAPLER VISISTAT 35W (STAPLE) ×2 IMPLANT
STOPCOCK 4 WAY LG BORE MALE ST (IV SETS) ×2 IMPLANT
SUT DVC VLOC 180 2-0 12IN GS21 (SUTURE) ×2
SUT ETHILON 2 0 FS 18 (SUTURE) IMPLANT
SUT MNCRL AB 4-0 PS2 18 (SUTURE) ×1 IMPLANT
SUT NOVA NAB DX-16 0-1 5-0 T12 (SUTURE) ×1 IMPLANT
SUT PDS AB 1 CTX 36 (SUTURE) IMPLANT
SUT PDS AB 1 TP1 96 (SUTURE) ×4 IMPLANT
SUT PDS AB 2-0 CT2 27 (SUTURE) IMPLANT
SUT PDS AB 3-0 SH 27 (SUTURE) IMPLANT
SUT PROLENE 2 0 SH DA (SUTURE) IMPLANT
SUT SILK 2 0 (SUTURE)
SUT SILK 2 0 SH CR/8 (SUTURE) ×2 IMPLANT
SUT SILK 2 0 TIES 10X30 (SUTURE) ×2 IMPLANT
SUT SILK 2-0 18XBRD TIE 12 (SUTURE) IMPLANT
SUT SILK 3 0 (SUTURE)
SUT SILK 3 0 SH CR/8 (SUTURE) ×1 IMPLANT
SUT SILK 3 0 TIES 10X30 (SUTURE) ×1 IMPLANT
SUT SILK 3-0 18XBRD TIE 12 (SUTURE) IMPLANT
SUT VIC AB 2-0 SH 27 (SUTURE) ×4
SUT VIC AB 2-0 SH 27X BRD (SUTURE) IMPLANT
SUT VICRYL 0 TIES 12 18 (SUTURE) IMPLANT
SUT VICRYL 0 UR6 27IN ABS (SUTURE) ×1 IMPLANT
SUT VLOC 180 2-0 6IN GS21 (SUTURE) ×1 IMPLANT
SUTURE DVC VL 180 2-0 12INGS21 (SUTURE) IMPLANT
TIP INNERVISION DETACH 56FR (MISCELLANEOUS) IMPLANT
TOWEL GREEN STERILE (TOWEL DISPOSABLE) ×2 IMPLANT
TOWEL GREEN STERILE FF (TOWEL DISPOSABLE) ×4 IMPLANT
TRAY FOLEY MTR SLVR 14FR STAT (SET/KITS/TRAYS/PACK) ×2 IMPLANT
TRAY FOLEY MTR SLVR 16FR STAT (SET/KITS/TRAYS/PACK) ×2 IMPLANT
TRAY LAPAROSCOPIC MC (CUSTOM PROCEDURE TRAY) ×2 IMPLANT
TROCAR ADV FIXATION 5X100MM (TROCAR) ×1 IMPLANT
TROCAR XCEL NON-BLD 5MMX100MML (ENDOMECHANICALS) IMPLANT
TUNNELER SHEATH ON-Q 16GX12 DP (PAIN MANAGEMENT) IMPLANT
YANKAUER SUCT BULB TIP NO VENT (SUCTIONS) IMPLANT

## 2020-10-25 NOTE — Anesthesia Postprocedure Evaluation (Signed)
Anesthesia Post Note  Patient: [name]  Procedure(s) Performed: XI ROBOTIC ASSISTED DISTAL GASTRECTOMY (Abdomen)     Patient location during evaluation: PACU Anesthesia Type: General Level of consciousness: sedated and patient cooperative Pain management: pain level controlled Vital Signs Assessment: post-procedure vital signs reviewed and stable Respiratory status: spontaneous breathing Cardiovascular status: stable Anesthetic complications: no   No notable events documented.  Last Vitals:  Vitals:   09/29/2020 1231 10/04/2020 1257  BP: (!) 120/58 116/61  Pulse: (!) 57 65  Resp: 18 16  Temp: (!) 36.4 C 36.4 C  SpO2: 94% 95%    Last Pain:  Vitals:   10/13/2020 1257  TempSrc: Oral  PainSc:                  Nolon Nations

## 2020-10-25 NOTE — Plan of Care (Signed)
Pt admitted from PACU with foley to SD intact, NGT with bloody drainage, irrigated and will continue to monitor color of drainage. O2/2L via nasal cannula, SCDs placed. Skin assessed, sacral foam intact to sacral area, inner folds are reddened but blanchable.

## 2020-10-25 NOTE — Anesthesia Procedure Notes (Addendum)
Procedure Name: Intubation Date/Time: 10/09/2020 8:45 AM Performed by: Harden Mo, CRNA Pre-anesthesia Checklist: Patient identified, Emergency Drugs available, Suction available and Patient being monitored Patient Re-evaluated:Patient Re-evaluated prior to induction Oxygen Delivery Method: Circle System Utilized Preoxygenation: Pre-oxygenation with 100% oxygen Induction Type: IV induction Ventilation: Mask ventilation without difficulty Laryngoscope Size: Mac and 3 Grade View: Grade I Tube type: Oral Tube size: 7.0 mm Number of attempts: 1 Airway Equipment and Method: Stylet and Oral airway Placement Confirmation: ETT inserted through vocal cords under direct vision, positive ETCO2 and breath sounds checked- equal and bilateral Secured at: 21 cm Tube secured with: Tape Dental Injury: Teeth and Oropharynx as per pre-operative assessment  Comments: Placed by Rexford Maus, SRNA

## 2020-10-25 NOTE — Transfer of Care (Signed)
Immediate Anesthesia Transfer of Care Note  Patient: [name]  Procedure(s) Performed: XI ROBOTIC ASSISTED DISTAL GASTRECTOMY (Abdomen)  Patient Location: PACU  Anesthesia Type:General  Level of Consciousness: awake and drowsy  Airway & Oxygen Therapy: Patient Spontanous Breathing and Patient connected to face mask oxygen  Post-op Assessment: Report given to RN and Post -op Vital signs reviewed and stable  Post vital signs: Reviewed and stable  Last Vitals:  Vitals Value Taken Time  BP 143/49 10/08/2020 1146  Temp    Pulse 70 10/01/2020 1149  Resp 15 10/22/2020 1149  SpO2 100 % 10/18/2020 1149  Vitals shown include unvalidated device data.  Last Pain:  Vitals:   10/05/2020 0608  TempSrc:   PainSc: 7       Patients Stated Pain Goal: 3 (09/32/35 5732)  Complications: No notable events documented.

## 2020-10-25 NOTE — Progress Notes (Signed)
CHG ordered per protocol for indwelling foley catheter.

## 2020-10-25 NOTE — Op Note (Signed)
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  General anesthesia was induced.  Arms were tucked, and foley catheter was placed.  Abdomen was prepped and draped in sterile fashion.  Timeout was performed according to the surgical safety checklist.  When all was correct, we continued.    The patient was rotated to the left and placed into reverse trendelenburg position.  A veress needle was placed at the left costal margin. Pneumoperitoneum was achieved to a pressure of 15 mm Hg.  The abdomen was examined with the camera and no evidence of carcinomatosis was seen.  Four robotic ports were placed and one assistant port.  An 8 mm robotic port was placed into the LLQ.  A 12 mm robotic port was placed in the infraumbilical skin.  The other two robotic ports were placed into the left and right mid abdomen.  A 5 mm robotic port was placed into the RUQ.    The stomach was mobilized.  The omentum was taken off the colon and the lesser sac was entered.  The vessel sealer was used to take down the gastroepiploics and the edge of the lesser curve and greater curve were exposed.  Two green loads of the robotic stapler were used to divide the stomach.   The gastrohepatic  ligament was opened.  The vessel sealer was then used to dissect superiorly and inferiorly.  Once the duodenal bulb was identified, it was divided with a blue load of the robotic stapler.  The specimen was placed into an Ecosac.  This was placed into the RUQ while the anastamosis was created.    The gastrojejunostomy was then performed.  This was antecolic and retrogastric.    The stomach and jejunum were opened, and the robotic stapler was brought into the abdomen.  The stomach and jejunum were opened with the hook cautery. The stapler went out through the opposite wall of the jejunum.  This defect was opened to be contiguous with the intial defect.  The NGT was advanced into it was seen in the defect.  Two 2-0 V lok sutures were used to close the anastamotic defect in connell fashion.  The NGT was secured.  3-0 vicryl sutures were used as anti-tension sutures.    The abdomen was inspected for hemostasis.  The robot was undocked.  The endoclose and interrupted 0-0 vicryl sutures were used to closed the umbilical fascial defect.  Pneumoperitoneum was allowed to evacuate.  The skin of all the incisions was closed with 4-0 monocryl in subcuticular fashion. The abdomen was then cleaned, dried, and dressed with dermabond and dry sterile dressings.    Needle, sponge, and instrument counts were correct x 2.  The patient was allowed to emerge from anesthesia and taken to the  PACU in stable condition.

## 2020-10-25 NOTE — H&P (Signed)
Mee Hives Location: [facility] Patient #: [id number] DOB: October 19, 1945 Married / Language: English / Race: White Female   History of Present Illness The patient is a 75 year old female who presents with gastric cancer. Pt is a 75 yo F referred by Dr. Benson Norway for a new diagnosis of gastric cancer 06/2020.  She had an endoscopy to evaluate for dysphagia in 05/2020.  This showed a stenosis that was dilated.  several polyps were seen in the antrum and these were biopsied.  The original pathology showed high grade dysplasia.  Dr. Benson Norway did a follow up endoscopy to reevaluate the polyps after this diagnosis to more completely remove them.  A hiatal hernia was seen as well as two 12 mm sessile polyps in the gastric antrum.  This time there was focal intramucosal adenocarcinoma and high grade dysplasia in chronic gastritis seen on the polyps.  the patient had not had any bloody bowel movements of which she was aware.  She also did not have any syncope.    Her mother and sister had breast cancer.  The patient hasn't had a cancer diagnosis before this.    The patient has a history of CHF, CAD, and h/o CVA.  She denies chest pain wtih exertion, but she does get short of breath with walking 1-2 blocks.    EGD 06/2020 Hung 3 cm hiatal hernia. - Two gastric polyps. Resected and retrieved. Clips (MR conditional) were placed. Injected. - Normal examined duodenum.  pathology 3/22 A. STOMACH, ANTRUM, POLYPECTOMY: -  Focal intramucosal adenocarcinoma and high-grade dysplasia arising in a background of chronic gastritis with intestinal metaplasia -  See comment  COMMENT:  Adenocarcinoma does not involve the cauterized edges of the biopsy. There is no evidence of lymphovascular space invasion     Past Surgical History Foot Surgery   Bilateral. Hysterectomy (not due to cancer) - Complete   Knee Surgery   Bilateral. Oral Surgery   Shoulder Surgery   Right. Spinal Surgery - Neck    Tonsillectomy    Diagnostic Studies History  Colonoscopy   within last year Mammogram   within last year  Allergies Penicillin G Pot in Dextrose *PENICILLINS*   Morphine Sulfate (Bulk) *ANALGESICS - OPIOID*   Demerol *ANALGESICS - OPIOID*   Allergies Reconciled    Medication History  Valsartan  (320MG Tablet, Oral) Active. ALPRAZolam  (0.25MG Tablet, Oral) Active. ALPRAZolam  (0.5MG Tablet, Oral) Active. traMADol HCl  (50MG Tablet, Oral) Active. amLODIPine Besylate  (5MG Tablet, Oral) Active. Atorvastatin Calcium  (20MG Tablet, Oral) Active. Doxepin HCl  (25MG Capsule, Oral) Active. Fluconazole  (100MG Tablet, Oral) Active. FLUoxetine HCl  (40MG Capsule, Oral) Active. Fluticasone Propionate  (50MCG/ACT Suspension, Nasal) Active. Fluzone High-Dose Quadrivalent  (0.7ML Susp Pref Syr, Intramuscular) Active. Furosemide  (40MG Tablet, Oral) Active. Gabapentin  (300MG Capsule, Oral) Active. glipiZIDE  (5MG Tablet, Oral) Active. IGlucose Monitoring System  (w/Device Kit,) Active. IGlucose Test Strips  (In Vitro) Active. levETIRAcetam  (500MG Tablet, Oral) Active. Levothyroxine Sodium  (137MCG Tablet, Oral) Active. Metoprolol Tartrate  (50MG Tablet, Oral) Active. Mirtazapine  (45MG Tablet, Oral) Active. Omeprazole  (40MG Capsule DR, Oral) Active. Pantoprazole Sodium  (40MG Tablet DR, Oral) Active. Potassium Chloride ER  (20MEQ Tablet ER, Oral) Active. Potassium Chloride Crys ER  (20MEQ Tablet ER, Oral) Active. rOPINIRole HCl  (0.25MG Tablet, Oral) Active. Rybelsus  (3MG Tablet, Oral) Active. Rybelsus  (7MG Tablet, Oral) Active. SteriLance TL  Active. traZODone HCl  (50MG Tablet, Oral) Active. Medications Reconciled  Social History   Caffeine use   Carbonated beverages. No alcohol use   No drug use   Tobacco use   Never smoker.  Family History  Arthritis   Brother, Mother, Sister, Son. Breast Cancer   Mother, Sister. Diabetes Mellitus   Mother. Heart Disease    Brother, Father, Son. Hypertension   Brother, Father, Mother, Sister, Son.  Pregnancy / Birth History  Age at menarche   23 years. Age of menopause   13-50 Contraceptive History   Oral contraceptives. Gravida   3 Maternal age   21-25 Regular periods    Other Problems  Anxiety Disorder   Arthritis   Chronic Renal Failure Syndrome   Depression   Diabetes Mellitus   Gastroesophageal Reflux Disease   Heart murmur   High blood pressure   Oophorectomy   Bilateral. Seizure Disorder   Thyroid Disease      Review of Systems  General Not Present- Appetite Loss, Chills, Fatigue, Fever, Night Sweats, Weight Gain and Weight Loss. Skin Present- Dryness. Not Present- Change in Wart/Mole, Hives, Jaundice, New Lesions, Non-Healing Wounds, Rash and Ulcer. HEENT Present- Seasonal Allergies and Wears glasses/contact lenses. Not Present- Earache, Hearing Loss, Hoarseness, Nose Bleed, Oral Ulcers, Ringing in the Ears, Sinus Pain, Sore Throat, Visual Disturbances and Yellow Eyes. Breast Present- Breast Mass. Not Present- Breast Pain, Nipple Discharge and Skin Changes. Cardiovascular Not Present- Chest Pain, Difficulty Breathing Lying Down, Leg Cramps, Palpitations, Rapid Heart Rate, Shortness of Breath and Swelling of Extremities. Gastrointestinal Present- Difficulty Swallowing. Not Present- Abdominal Pain, Bloating, Bloody Stool, Change in Bowel Habits, Chronic diarrhea, Constipation, Excessive gas, Gets full quickly at meals, Hemorrhoids, Indigestion, Nausea, Rectal Pain and Vomiting. Female Genitourinary Not Present- Frequency, Nocturia, Painful Urination, Pelvic Pain and Urgency. Neurological Present- Seizures. Not Present- Decreased Memory, Fainting, Headaches, Numbness, Tingling, Tremor, Trouble walking and Weakness. Psychiatric Present- Anxiety and Depression. Not Present- Bipolar, Change in Sleep Pattern, Fearful and Frequent crying. Endocrine Not Present- Cold Intolerance, Excessive Hunger,  Hair Changes, Heat Intolerance, Hot flashes and New Diabetes. Hematology Not Present- Blood Thinners, Easy Bruising, Excessive bleeding, Gland problems, HIV and Persistent Infections.  Vitals Weight: 216.06 lb   Height: 59 in  Body Surface Area: 1.91 m   Body Mass Index: 43.64 kg/m   Temp.: 98 F    Pulse: 83 (Regular)    P.OX: 96% (Room air) BP: 160/40(Sitting, Left Arm, Standard)       Physical Exam  General Mental Status - Alert. General Appearance - Consistent with stated age. Hydration - Well hydrated. Voice - Normal.  Head and Neck Head - normocephalic, atraumatic with no lesions or palpable masses. Trachea - midline. Thyroid Gland Characteristics - normal size and consistency.  Eye Eyeball - Bilateral - Extraocular movements intact. Sclera/Conjunctiva - Bilateral - No scleral icterus.  Chest and Lung Exam Chest and lung exam reveals  - quiet, even and easy respiratory effort with no use of accessory muscles and on auscultation, normal breath sounds, no adventitious sounds and normal vocal resonance. Inspection Chest Wall - Normal. Back - normal.  Cardiovascular Cardiovascular examination reveals  - normal heart sounds, regular rate and rhythm with no murmurs and normal pedal pulses bilaterally. Note:  + tr pitting edema   Abdomen Inspection Inspection of the abdomen reveals - No Hernias. Palpation/Percussion Palpation and Percussion of the abdomen reveal - Soft, Non Tender, No Rebound tenderness, No Rigidity (guarding) and No hepatosplenomegaly. Auscultation Auscultation of the abdomen reveals - Bowel sounds normal.  Neurologic Neurologic evaluation  reveals  - alert and oriented x 3 with no impairment of recent or remote memory. Mental Status - Normal.  Musculoskeletal Global Assessment  - Note:  no gross deformities.  Normal Exam - Left - Upper Extremity Strength Normal and Lower Extremity Strength Normal. Normal Exam - Right - Upper Extremity  Strength Normal and Lower Extremity Strength Normal.  Lymphatic Head & Neck  General Head & Neck Lymphatics: Bilateral - Description - Normal. Axillary  General Axillary Region: Bilateral - Description - Normal. Tenderness - Non Tender. Femoral & Inguinal  Generalized Femoral & Inguinal Lymphatics: Bilateral - Description - No Generalized lymphadenopathy.    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 I recommend distal gastrectomy. I think we would be successful in a minimally invasive manner given her lack of prior upper abdominal surgery. Dissection down on the left gastric pedicle is less important with the intramucosal component.  I discussed surgery with the patient. I discussed risks of bleeding, infection, damage to adjacent structures, breakdown of connection between stomach and small intestine, heart or lung complications, possible need for additional surgeries and/or procedures, blood clot, and death.  She appears to be adequately mobile . Will send for cardiac risk stratification. Current Plans You are being scheduled for surgery - Our schedulers will call you.   You should hear from our office's scheduling department within 5 working days about the location, date, and time of surgery.  We try to make accommodations for patient's preferences in scheduling surgery, but sometimes the OR schedule or the surgeon's schedule prevents [place] from making those accommodations.  If you have not heard from our office [phone] in 5 working days, call the office and ask for your surgeon's nurse.  If you have other questions about your diagnosis, plan, or surgery, call the office and ask for your surgeon's nurse.  Advised patient to stop ASA, anticoagulant, blood thinners, and NSAIDs five (3) days prior to surgery. Pt Education - [facility]  Laparoscopic Surgery HCI CAD (CORONARY ARTERY DISEASE) (I25.10) Impression: Will get cardiology to see for risk stratification.

## 2020-10-25 NOTE — Anesthesia Procedure Notes (Addendum)
Arterial Line Insertion Start/End06/27/2022 7:20 AM, 10/15/2020 7:28 AM Performed by: Harden Mo, CRNA, CRNA  Patient location: OOR procedure area. Preanesthetic checklist: patient identified, IV checked, site marked, risks and benefits discussed, surgical consent, monitors and equipment checked, pre-op evaluation, timeout performed and anesthesia consent Lidocaine 1% used for infiltration Left, radial was placed Catheter size: 20 Fr Hand hygiene performed , maximum sterile barriers used  and Seldinger technique used  Attempts: 1 Procedure performed without using ultrasound guided technique. Ultrasound Notes:anatomy identified Following insertion, dressing applied. Post procedure assessment: normal and unchanged  Additional procedure comments: Placed by Rexford Maus, SRNA.

## 2020-10-25 NOTE — Interval H&P Note (Signed)
History and Physical Interval Note:  10/09/2020 7:40 AM  [name]  has presented today for surgery, with the diagnosis of INTRAMUCOSAL ADENOCARCINOMA OF STOMACH.  The various methods of treatment have been discussed with the patient and family. After consideration of risks, benefits and other options for treatment, the patient has consented to  Procedure(s): ROBOTIC DISTAL GASTRECTOMY (N/A) as a surgical intervention.  The patient's history has been reviewed, patient examined, no change in status, stable for surgery.  I have reviewed the patient's chart and labs.  Questions were answered to the patient's satisfaction.     Stark Klein

## 2020-10-26 ENCOUNTER — Encounter (HOSPITAL_COMMUNITY): Payer: Self-pay | Admitting: General Surgery

## 2020-10-26 LAB — CBC
HCT: 34.3 % — ABNORMAL LOW (ref 36.0–46.0)
Hemoglobin: 10.8 g/dL — ABNORMAL LOW (ref 12.0–15.0)
MCH: 29.2 pg (ref 26.0–34.0)
MCHC: 31.5 g/dL (ref 30.0–36.0)
MCV: 92.7 fL (ref 80.0–100.0)
Platelets: 203 10*3/uL (ref 150–400)
RBC: 3.7 MIL/uL — ABNORMAL LOW (ref 3.87–5.11)
RDW: 14.4 % (ref 11.5–15.5)
WBC: 17.2 10*3/uL — ABNORMAL HIGH (ref 4.0–10.5)
nRBC: 0 % (ref 0.0–0.2)

## 2020-10-26 LAB — BASIC METABOLIC PANEL
Anion gap: 9 (ref 5–15)
BUN: 15 mg/dL (ref 8–23)
CO2: 21 mmol/L — ABNORMAL LOW (ref 22–32)
Calcium: 8.2 mg/dL — ABNORMAL LOW (ref 8.9–10.3)
Chloride: 104 mmol/L (ref 98–111)
Creatinine, Ser: 1.45 mg/dL — ABNORMAL HIGH (ref 0.44–1.00)
GFR, Estimated: 37 mL/min — ABNORMAL LOW (ref >60)
Glucose, Bld: 149 mg/dL — ABNORMAL HIGH (ref 70–99)
Potassium: 5 mmol/L (ref 3.5–5.1)
Sodium: 134 mmol/L — ABNORMAL LOW (ref 135–145)

## 2020-10-26 LAB — GLUCOSE, CAPILLARY
Glucose-Capillary: 121 mg/dL — ABNORMAL HIGH (ref 70–99)
Glucose-Capillary: 154 mg/dL — ABNORMAL HIGH (ref 70–99)
Glucose-Capillary: 134 mg/dL — ABNORMAL HIGH (ref 70–99)
Glucose-Capillary: 103 mg/dL — ABNORMAL HIGH (ref 70–99)

## 2020-10-26 LAB — MAGNESIUM: Magnesium: 1.7 mg/dL (ref 1.7–2.4)

## 2020-10-26 LAB — PHOSPHORUS: Phosphorus: 3.5 mg/dL (ref 2.5–4.6)

## 2020-10-26 MED ORDER — FLUOXETINE HCL 20 MG PO CAPS
40.0000 mg | ORAL_CAPSULE | Freq: Every morning | ORAL | Status: DC
  Administered 2020-10-26 – 2020-10-31 (×6): 40 mg via ORAL
  Filled 2020-10-26 (×4): qty 2

## 2020-10-26 MED ORDER — ROPINIROLE HCL 0.25 MG PO TABS
0.2500 mg | ORAL_TABLET | Freq: Every day | ORAL | Status: DC
  Administered 2020-10-26 – 2020-10-31 (×6): 0.25 mg via ORAL
  Filled 2020-10-26 (×6): qty 1

## 2020-10-26 MED ORDER — BUPROPION HCL ER (XL) 150 MG PO TB24
150.0000 mg | ORAL_TABLET | Freq: Every day | ORAL | Status: DC
  Administered 2020-10-26 – 2020-10-31 (×6): 150 mg via ORAL
  Filled 2020-10-26 (×6): qty 1

## 2020-10-26 MED ORDER — DOXEPIN HCL 25 MG PO CAPS
25.0000 mg | ORAL_CAPSULE | Freq: Every day | ORAL | Status: DC
  Administered 2020-10-26 – 2020-10-31 (×6): 25 mg via ORAL
  Filled 2020-10-26 (×6): qty 1

## 2020-10-26 MED ORDER — GABAPENTIN 300 MG PO CAPS
600.0000 mg | ORAL_CAPSULE | Freq: Every day | ORAL | Status: DC
  Administered 2020-10-26 – 2020-10-31 (×6): 600 mg via ORAL
  Filled 2020-10-26 (×6): qty 2

## 2020-10-26 NOTE — Progress Notes (Signed)
NGT discontinued, pt tolerate meds well. Will continue to monitor.

## 2020-10-26 NOTE — Evaluation (Signed)
Physical Therapy Evaluation Patient Details Name: [name] MRN: [id number] DOB: 11/06/1945 Today's Date: 10/26/2020   History of Present Illness  The patient is a 75 year old female who presents with gastric cancer, intramucosal adenocarcinoma of pyloric antrum. Pt underwent robotic assisted distal gastrectomy on 6/28. PMH: obesity, hiatal hernia, CHF, CAD, h/o CVA   Clinical Impression  Pt admitted with above. Pt with orthostatic hypotensive episode resulting in patient becoming unresponsive briefly and guided back into chair immediately after standing. Pts BP dropped from 132/58 to 95/52. RN aware and assessed pt. Due to this pt unable to safely ambulate at this time. Will try to progress ambulation next session. Despite this episode pt did move with minimal assist and suspect will progress well once BP stabilized. Will also continue to assess d/c recs as well.     Follow Up Recommendations Home health PT;Supervision/Assistance - 24 hour    Equipment Recommendations  Rolling walker with 5" wheels    Recommendations for Other Services       Precautions / Restrictions Precautions Precautions: Fall Restrictions Weight Bearing Restrictions: No      Mobility  Bed Mobility Overal bed mobility: Needs Assistance Bed Mobility: Sit to Sidelying;Rolling Rolling: Min assist       Sit to sidelying: Min assist;Mod assist General bed mobility comments: modA for LE managment back into bed    Transfers Overall transfer level: Needs assistance Equipment used: Rolling walker (2 wheeled) Transfers: Sit to/from [facility] Sit to Stand: Min assist Stand pivot transfers: Min assist;+2 physical assistance       General transfer comment: upon 1st stand pt immeadiately became non-responsive and was assisted back into chair, legs elevated and pt came too, BP taken at 132/58 however by the time a dynamap was found it was 3-4 min after the incident. Pt stated, "I  just felt swimmy headed and like I was going to pass out". RN came to assess pt and assisted PT with completing a std pvt transfer to the bed, BP taken at EOB was 95/52, HR 61, pt with noted hypotension  Ambulation/Gait             General Gait Details: deferred due to hypotensive episode  Stairs            Wheelchair Mobility    Modified Rankin (Stroke Patients Only)       Balance Overall balance assessment: Needs assistance Sitting-balance support: Feet supported;No upper extremity supported Sitting balance-[name]: Fair     Standing balance support: Bilateral upper extremity supported Standing balance-[name]: Poor Standing balance comment: pt hypotensive                             Pertinent Vitals/Pain Pain Assessment: Faces Faces Pain Scale: Hurts a little bit Pain Location: abdominal Pain Descriptors / Indicators: Discomfort Pain Intervention(s): Monitored during session    Home Living Family/patient expects to be discharged to:: Private residence Living Arrangements: Spouse/significant other Available Help at Discharge: Family;Available 24 hours/day Type of Home: House Home Access: Stairs to enter Entrance Stairs-Rails: Psychiatric nurse of Steps: 1 Home Layout: One level Home Equipment: Walker - 2 wheels;Cane - single point;Shower seat - built in;Hand held shower head;Grab bars - tub/shower      Prior Function Level of Independence: Independent with assistive device(s)         Comments: uses straight cane for walking, limited activity tolerance due to onset of SOB  Hand Dominance   Dominant Hand: Right    Extremity/Trunk Assessment   Upper Extremity Assessment Upper Extremity Assessment: Overall WFL for tasks assessed    Lower Extremity Assessment Lower Extremity Assessment: Overall WFL for tasks assessed    Cervical / Trunk Assessment Cervical / Trunk Assessment: Other exceptions Cervical  / Trunk Exceptions: recent abdominal surgery  Communication   Communication: No difficulties  Cognition Arousal/Alertness: Awake/alert Behavior During Therapy: WFL for tasks assessed/performed Overall Cognitive Status: Within Functional Limits for tasks assessed                                        General Comments General comments (skin integrity, edema, etc.): pt with hypotensive episode, drop in BP from 132/58 to 95/52 with std pvt transfer from sitting in chair to sitting in bed    Exercises     Assessment/Plan    PT Assessment Patient needs continued PT services  PT Problem List Decreased strength;Decreased activity tolerance;Decreased balance;Decreased mobility;Decreased knowledge of use of DME       PT Treatment Interventions DME instruction;Gait training;Stair training;Functional mobility training;Therapeutic activities;Therapeutic exercise;Balance training    PT Goals (Current goals can be found in the Care Plan section)  Acute Rehab PT Goals PT Goal Formulation: With patient/family Potential to Achieve Goals: Good    Frequency Min 3X/week   Barriers to discharge        Co-evaluation               AM-PAC PT "6 Clicks" Mobility  Outcome Measure Help needed turning from your back to your side while in a flat bed without using bedrails?: A Little Help needed moving from lying on your back to sitting on the side of a flat bed without using bedrails?: A Little Help needed moving to and from a bed to a chair (including a wheelchair)?: A Little Help needed standing up from a chair using your arms (e.g., wheelchair or bedside chair)?: A Little Help needed to walk in hospital room?: A Little Help needed climbing 3-5 steps with a railing? : A Lot 6 Click Score: 17    End of Session   Activity Tolerance: Other (comment) (limited by hypotensive episode) Patient left: in bed;with call bell/phone within reach;with bed alarm set;with nursing/sitter  in room Nurse Communication: Mobility status (RN came to assess pt s/p hypotensive episode) PT Visit Diagnosis: Unsteadiness on feet (R26.81);Muscle weakness (generalized) (M62.81);Difficulty in walking, not elsewhere classified (R26.2)    Time: 1884-1660 PT Time Calculation (min) (ACUTE ONLY): 20 min   Charges:   PT Evaluation $PT Eval Moderate Complexity: 1 Mod         [name], PT, DPT Acute Rehabilitation Services Pager #: [phone] Office #: [phone]   [name] 10/26/2020, 5:34 PM

## 2020-10-26 NOTE — Progress Notes (Signed)
1 Day Post-Op   Subjective/Chief Complaint: Denied nausea overnight.  Only took 2 doses of pain meds overnight.     Objective: Vital signs in last 24 hours: Temp:  [97.5 F (36.4 C)-98.2 F (36.8 C)] 97.6 F (36.4 C) (06/29 0950) Pulse Rate:  [57-87] 87 (06/29 0950) Resp:  [13-18] 18 (06/29 0950) BP: (95-151)/(45-83) 110/46 (06/29 0950) SpO2:  [90 %-100 %] 98 % (06/29 0950) Arterial Line BP: (127-139)/(46-51) 139/49 (06/28 1231)    Intake/Output from previous day: 06/28 0701 - 06/29 0700 In: 1630 [I.V.:1400; NG/GT:30; IV Piggyback:200] Out: 615 [Urine:415; Emesis/NG output:100; Blood:100] Intake/Output this shift: Total I/O In: -  Out: 50 [Emesis/NG output:50]  General appearance: alert, cooperative, and mild distress Resp: breathing comfortably GI: soft, approp tender, slightly distended.   Extremities: extremities normal, atraumatic, no cyanosis or edema  Lab Results:  Recent Labs    10/15/2020 1326 10/26/20 0323  WBC 21.2* 17.2*  HGB 13.4 10.8*  HCT 43.0 34.3*  PLT 200 203   BMET Recent Labs    10/16/2020 1326 10/26/20 0323  NA  --  134*  K  --  5.0  CL  --  104  CO2  --  21*  GLUCOSE  --  149*  BUN  --  15  CREATININE 1.28* 1.45*  CALCIUM  --  8.2*   PT/INR No results for input(s): LABPROT, INR in the last 72 hours. ABG No results for input(s): PHART, HCO3 in the last 72 hours.  Invalid input(s): PCO2, PO2  Studies/Results: DG Abd Portable 1V  Result Date: 10/05/2020 CLINICAL DATA:  Check gastric catheter placement EXAM: PORTABLE ABDOMEN - 1 VIEW COMPARISON:  None. FINDINGS: Gastric catheter is noted within the stomach. Scattered large and small bowel gas is noted. No obstructive changes are seen. IMPRESSION: Gastric catheter within the stomach. Electronically Signed   By: Inez Catalina M.D.   On: 10/07/2020 15:07    Anti-infectives: Anti-infectives (From admission, onward)    Start     Dose/Rate Route Frequency Ordered Stop   10/04/2020 2100   ciprofloxacin (CIPRO) IVPB 400 mg        400 mg 200 mL/hr over 60 Minutes Intravenous Every 12 hours 10/08/2020 1258 10/27/2020 2148   10/11/2020 0600  ciprofloxacin (CIPRO) IVPB 400 mg        400 mg 200 mL/hr over 60 Minutes Intravenous On call to O.R. 10/01/2020 0547 10/10/2020 0847       Assessment/Plan: s/p Procedure(s): XI ROBOTIC ASSISTED DISTAL GASTRECTOMY (N/A)  Await pathology  D/c ngt and have sips of clears.   Leave foley again for urinary output monitoring.  PT consult CKD, creatinine slightly elevated above baseline.  Continue IV fluids.   Restart cardiac meds and psych meds.  Wait until tomorrow to see if any n/v from swelling prior to starting the remaining meds.     LOS: 1 day  Milus Height, MD FACS Surgical Oncology, General Surgery, Trauma and [facility], [place] for weekday/non holidays Check [email or web address] for coverage night/weekend/holidays  Do not use SecureChat as it is not reliable for timely patient care.

## 2020-10-26 NOTE — Progress Notes (Addendum)
Pt had an syncope episode with physical therapy. Pt went out about 5 minutes before she responsed. BP 132/58 after pt came too. PT call nurse, pt pale but responsive and able to answer question. Transfer pt back to bed, BP 95/52 HR:62. Call [facility], to page MD.  Dr. Barry Dienes call back, continue to monitor.

## 2020-10-26 NOTE — Plan of Care (Signed)

## 2020-10-26 NOTE — Progress Notes (Signed)
Right arm noted to be infiltrated from IV sites x2. IV x2 removed. Right arm elevated and warm pak applied. Will continue to monitor.

## 2020-10-27 ENCOUNTER — Inpatient Hospital Stay (HOSPITAL_COMMUNITY): Payer: Medicare PPO

## 2020-10-27 DIAGNOSIS — E1140 Type 2 diabetes mellitus with diabetic neuropathy, unspecified: Secondary | ICD-10-CM | POA: Diagnosis not present

## 2020-10-27 LAB — SODIUM, URINE, RANDOM: Sodium, Ur: <10 mmol/L

## 2020-10-27 LAB — BASIC METABOLIC PANEL
Anion gap: 8 (ref 5–15)
BUN: 18 mg/dL (ref 8–23)
CO2: 22 mmol/L (ref 22–32)
Calcium: 8 mg/dL — ABNORMAL LOW (ref 8.9–10.3)
Chloride: 104 mmol/L (ref 98–111)
Creatinine, Ser: 2.03 mg/dL — ABNORMAL HIGH (ref 0.44–1.00)
GFR, Estimated: 25 mL/min — ABNORMAL LOW (ref >60)
Glucose, Bld: 102 mg/dL — ABNORMAL HIGH (ref 70–99)
Potassium: 4.8 mmol/L (ref 3.5–5.1)
Sodium: 134 mmol/L — ABNORMAL LOW (ref 135–145)

## 2020-10-27 LAB — URINALYSIS, COMPLETE (UACMP) WITH MICROSCOPIC
Bilirubin Urine: NEGATIVE
Glucose, UA: 50 mg/dL — AB
Ketones, ur: NEGATIVE mg/dL
Leukocytes,Ua: NEGATIVE
Nitrite: NEGATIVE
Protein, ur: NEGATIVE mg/dL
Specific Gravity, Urine: 1.01 (ref 1.005–1.030)
pH: 5 (ref 5.0–8.0)

## 2020-10-27 LAB — CBC
HCT: 30.8 % — ABNORMAL LOW (ref 36.0–46.0)
Hemoglobin: 9.5 g/dL — ABNORMAL LOW (ref 12.0–15.0)
MCH: 29.5 pg (ref 26.0–34.0)
MCHC: 30.8 g/dL (ref 30.0–36.0)
MCV: 95.7 fL (ref 80.0–100.0)
Platelets: 155 10*3/uL (ref 150–400)
RBC: 3.22 MIL/uL — ABNORMAL LOW (ref 3.87–5.11)
RDW: 15 % (ref 11.5–15.5)
WBC: 18.7 10*3/uL — ABNORMAL HIGH (ref 4.0–10.5)
nRBC: 0 % (ref 0.0–0.2)

## 2020-10-27 LAB — GLUCOSE, CAPILLARY
Glucose-Capillary: 133 mg/dL — ABNORMAL HIGH (ref 70–99)
Glucose-Capillary: 104 mg/dL — ABNORMAL HIGH (ref 70–99)
Glucose-Capillary: 142 mg/dL — ABNORMAL HIGH (ref 70–99)
Glucose-Capillary: 148 mg/dL — ABNORMAL HIGH (ref 70–99)

## 2020-10-27 LAB — SURGICAL PATHOLOGY

## 2020-10-27 LAB — CREATININE, URINE, RANDOM: Creatinine, Urine: 96.33 mg/dL

## 2020-10-27 MED ORDER — IOHEXOL 300 MG/ML  SOLN
100.0000 mL | Freq: Once | INTRAMUSCULAR | Status: AC | PRN
  Administered 2020-10-27: 100 mL via ORAL

## 2020-10-27 NOTE — Progress Notes (Signed)
PT Cancellation Note  Patient Details Name: [name] MRN: [id number] DOB: 10-Oct-1945   Cancelled Treatment:    Reason Eval/Treat Not Completed: Patient at procedure or test/unavailable. Transport present on arrival and taking pt to IR. Will check back as time allows.    Allena Katz 10/27/2020, 2:28 PM

## 2020-10-27 NOTE — Progress Notes (Signed)
   10/27/20 1252  Assess: MEWS Score  Temp 98.4 F (36.9 C)  BP (!) 148/61  Pulse Rate (!) 111  Resp 18  SpO2 98 %  O2 Device Nasal Cannula  O2 Flow Rate (L/min) 1 L/min  Assess: MEWS Score  MEWS Temp 0  MEWS Systolic 0  MEWS Pulse 2  MEWS RR 0  MEWS LOC 0  MEWS Score 2  MEWS Score Color Yellow  Assess: if the MEWS score is Yellow or Red  Were vital signs taken at a resting state? Yes  Focused Assessment No change from prior assessment  Early Detection of Sepsis Score *See Row Information* Low  MEWS guidelines implemented *See Row Information* No, other (Comment) (re-check vitals after scheduled metoprolol)  Treat  MEWS Interventions Administered scheduled meds/treatments

## 2020-10-27 NOTE — Progress Notes (Signed)
   10/27/20 1252 10/27/20 1429  Assess: MEWS Score  Temp 98.4 F (36.9 C) 99.5 F (37.5 C)  BP (!) 148/61 (!) 146/59  Pulse Rate (!) 111 (!) 103  Resp 18 17  SpO2  --  98 %  O2 Device  --  Nasal Cannula  Assess: MEWS Score  MEWS Temp 0 0  MEWS Systolic 0 0  MEWS Pulse 2 1  MEWS RR 0 0  MEWS LOC 0 0  MEWS Score 2 1  MEWS Score Color Yellow Green  Assess: if the MEWS score is Yellow or Red  Were vital signs taken at a resting state?  --  Yes  Focused Assessment  --  No change from prior assessment  Early Detection of Sepsis Score *See Row Information*  --  Low  MEWS guidelines implemented *See Row Information*  --  No, vital signs rechecked (pt went off unit for a procedure before we couldrecheck within an hour of first vitals and med admin)  Document  Patient Outcome  --  Stabilized after interventions

## 2020-10-27 NOTE — Progress Notes (Signed)
Pt off unit to IR

## 2020-10-27 NOTE — Progress Notes (Signed)
2 Days Post-Op   Subjective/Chief Complaint: NGT removed yesterday, stayed on sips only.  Did not request pain medication, but creatinine is up this AM.  Also had episode of syncope yesterday.  No flatus yet.   Objective: Vital signs in last 24 hours: Temp:  [97.6 F (36.4 C)-98.5 F (36.9 C)] 98.2 F (36.8 C) (06/30 0805) Pulse Rate:  [80-110] 97 (06/30 0805) Resp:  [17-20] 20 (06/30 0805) BP: (110-132)/(46-62) 132/55 (06/30 0805) SpO2:  [93 %-100 %] 97 % (06/30 0805)    Intake/Output from previous day: 06/29 0701 - 06/30 0700 In: 2272.2 [P.O.:60; I.V.:1812.2; IV Piggyback:400] Out: 700 [Urine:650; Emesis/NG output:50] Intake/Output this shift: No intake/output data recorded.  General appearance: alert, cooperative, and mild distress Resp: breathing comfortably GI: soft, approp tender just at upper abdomen, non distended.  Extremities: extremities normal, atraumatic, no cyanosis or edema   Lab Results:  Recent Labs    10/26/20 0323 10/27/20 0208  WBC 17.2* 18.7*  HGB 10.8* 9.5*  HCT 34.3* 30.8*  PLT 203 155   BMET Recent Labs    10/26/20 0323 10/27/20 0208  NA 134* 134*  K 5.0 4.8  CL 104 104  CO2 21* 22  GLUCOSE 149* 102*  BUN 15 18  CREATININE 1.45* 2.03*  CALCIUM 8.2* 8.0*   PT/INR No results for input(s): LABPROT, INR in the last 72 hours. ABG No results for input(s): PHART, HCO3 in the last 72 hours.  Invalid input(s): PCO2, PO2  Studies/Results: DG Abd Portable 1V  Result Date: 10/12/2020 CLINICAL DATA:  Check gastric catheter placement EXAM: PORTABLE ABDOMEN - 1 VIEW COMPARISON:  None. FINDINGS: Gastric catheter is noted within the stomach. Scattered large and small bowel gas is noted. No obstructive changes are seen. IMPRESSION: Gastric catheter within the stomach. Electronically Signed   By: Inez Catalina M.D.   On: 10/01/2020 15:07    Anti-infectives: Anti-infectives (From admission, onward)    Start     Dose/Rate Route Frequency Ordered  Stop   10/11/2020 2100  ciprofloxacin (CIPRO) IVPB 400 mg        400 mg 200 mL/hr over 60 Minutes Intravenous Every 12 hours 10/01/2020 1258 10/14/2020 2148   09/28/2020 0600  ciprofloxacin (CIPRO) IVPB 400 mg        400 mg 200 mL/hr over 60 Minutes Intravenous On call to O.R. 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   D/c foley as UOP improved.   CKD, up a bit more today.  Continue IV fluids.   Will get FENa, hold lasix   LOS: 2 days  Milus Height, MD FACS Surgical Oncology, General Surgery, Trauma and [place] [facility], [place] for weekday/non holidays Check [email or web address] for coverage night/weekend/holidays  Do not use SecureChat as it is not reliable for timely patient care.

## 2020-10-27 NOTE — TOC Initial Note (Signed)
Transition of Care [facility]) - Initial/Assessment Note    Patient Details  Name: [name] MRN: [id number] Date of Birth: 1945-10-27  Transition of Care [facility]) CM/SW Contact:    Marilu Favre, RN Phone Number: 10/27/2020, 8:55 AM  Clinical Narrative:                 Spoke to patient at bedside. Confirmed face sheet information. Patient from home with husband. Has walker and shower chair at home already. Patient agreeable to home health services HHPT, no preference. Referral given to [facility] with [facility].   Expected Discharge Plan: [place]     Patient Goals and CMS Choice Patient states their goals for this hospitalization and ongoing recovery are:: to return to home CMS Medicare.gov Compare Post Acute Care list provided to:: Patient Choice offered to / list presented to : Patient  Expected Discharge Plan and Services Expected Discharge Plan: [place]   Discharge Planning Services: CM Consult Post Acute Care Choice: [place] arrangements for the past 2 months: Single Family Home                 DME Arranged: N/A DME Agency: NA       HH Arranged: PT HH Agency: [place] Date [facility] Agency Contacted: 10/27/20 Time HH Agency Contacted: [phone] Representative spoke with at [place]: Tommi Rumps  Prior Living Arrangements/Services Living arrangements for the past 2 months: [place] Lives with:: Spouse Patient language and need for interpreter reviewed:: Yes Do you feel safe going back to the place where you live?: Yes      Need for Family Participation in Patient Care: Yes (Comment) Care giver support system in place?: Yes (comment)   Criminal Activity/Legal Involvement Pertinent to Current Situation/Hospitalization: No - Comment as needed  Activities of Daily Living      Permission Sought/Granted   Permission granted to share information with : No              Emotional  Assessment Appearance:: Appears stated age Attitude/Demeanor/Rapport: Engaged Affect (typically observed): Accepting Orientation: : Oriented to Self, Oriented to Place, Oriented to  Time, Oriented to Situation Alcohol / Substance Use: Not Applicable Psych Involvement: No (comment)  Admission diagnosis:  S/P partial gastrectomy [Z90.3] Patient Active Problem List   Diagnosis Date Noted   S/P partial gastrectomy 10/15/2020   Type 2 diabetes mellitus without complication ([place]) 11/91/4782   Hyperlipidemia 03/01/2014   Focal seizures ([place]) 10/21/2013   TIA (transient ischemic attack) 10/21/2013   CHEST PAIN 01/24/2010   BRADYCARDIA 12/23/2009   Diabetes ([place]) 08/04/2008   HYPOTHYROIDISM 03/06/2007   OBESITY 03/06/2007   OBSTRUCTIVE SLEEP APNEA 03/06/2007   HYPERTENSION, BENIGN 03/06/2007   CORONARY ARTERY DISEASE 03/06/2007   ALLERGIC RHINITIS 03/06/2007   DEGENERATIVE JOINT DISEASE, GENERALIZED 03/06/2007   BACK PAIN, CHRONIC 03/06/2007   PCP:  Harlan Stains, MD Pharmacy:   [facility]- [place], [place] - [address] [place] [address] Phone: [phone] Fax: [phone]  [facility] - [place], [place] - [address] [place] [address] Phone: [phone] Fax: [phone]     Social Determinants of Health (SDOH) Interventions    Readmission Risk Interventions No flowsheet data found.

## 2020-10-28 ENCOUNTER — Inpatient Hospital Stay (HOSPITAL_COMMUNITY): Payer: Medicare PPO

## 2020-10-28 LAB — CBC
HCT: 34.6 % — ABNORMAL LOW (ref 36.0–46.0)
HCT: 29.6 % — ABNORMAL LOW (ref 36.0–46.0)
Hemoglobin: 10.5 g/dL — ABNORMAL LOW (ref 12.0–15.0)
Hemoglobin: 9.3 g/dL — ABNORMAL LOW (ref 12.0–15.0)
MCH: 29.3 pg (ref 26.0–34.0)
MCH: 29.7 pg (ref 26.0–34.0)
MCHC: 30.3 g/dL (ref 30.0–36.0)
MCHC: 31.4 g/dL (ref 30.0–36.0)
MCV: 96.6 fL (ref 80.0–100.0)
MCV: 94.6 fL (ref 80.0–100.0)
Platelets: 223 10*3/uL (ref 150–400)
Platelets: 201 10*3/uL (ref 150–400)
RBC: 3.58 MIL/uL — ABNORMAL LOW (ref 3.87–5.11)
RBC: 3.13 MIL/uL — ABNORMAL LOW (ref 3.87–5.11)
RDW: 14.8 % (ref 11.5–15.5)
RDW: 14.9 % (ref 11.5–15.5)
WBC: 19.2 10*3/uL — ABNORMAL HIGH (ref 4.0–10.5)
WBC: 27.9 10*3/uL — ABNORMAL HIGH (ref 4.0–10.5)
nRBC: 0 % (ref 0.0–0.2)
nRBC: 0 % (ref 0.0–0.2)

## 2020-10-28 LAB — BASIC METABOLIC PANEL
Anion gap: 8 (ref 5–15)
Anion gap: 12 (ref 5–15)
BUN: 21 mg/dL (ref 8–23)
BUN: 22 mg/dL (ref 8–23)
CO2: 22 mmol/L (ref 22–32)
CO2: 19 mmol/L — ABNORMAL LOW (ref 22–32)
Calcium: 8.3 mg/dL — ABNORMAL LOW (ref 8.9–10.3)
Calcium: 8.4 mg/dL — ABNORMAL LOW (ref 8.9–10.3)
Chloride: 105 mmol/L (ref 98–111)
Chloride: 102 mmol/L (ref 98–111)
Creatinine, Ser: 1.73 mg/dL — ABNORMAL HIGH (ref 0.44–1.00)
Creatinine, Ser: 1.76 mg/dL — ABNORMAL HIGH (ref 0.44–1.00)
GFR, Estimated: 30 mL/min — ABNORMAL LOW (ref >60)
GFR, Estimated: 29 mL/min — ABNORMAL LOW (ref >60)
Glucose, Bld: 184 mg/dL — ABNORMAL HIGH (ref 70–99)
Glucose, Bld: 229 mg/dL — ABNORMAL HIGH (ref 70–99)
Potassium: 5.3 mmol/L — ABNORMAL HIGH (ref 3.5–5.1)
Potassium: 4.7 mmol/L (ref 3.5–5.1)
Sodium: 135 mmol/L (ref 135–145)
Sodium: 133 mmol/L — ABNORMAL LOW (ref 135–145)

## 2020-10-28 LAB — GLUCOSE, CAPILLARY
Glucose-Capillary: 192 mg/dL — ABNORMAL HIGH (ref 70–99)
Glucose-Capillary: 268 mg/dL — ABNORMAL HIGH (ref 70–99)
Glucose-Capillary: 172 mg/dL — ABNORMAL HIGH (ref 70–99)
Glucose-Capillary: 175 mg/dL — ABNORMAL HIGH (ref 70–99)

## 2020-10-28 MED ORDER — LEVETIRACETAM IN NACL 500 MG/100ML IV SOLN
500.0000 mg | Freq: Two times a day (BID) | INTRAVENOUS | Status: DC
  Administered 2020-10-28 – 2020-11-07 (×22): 500 mg via INTRAVENOUS
  Filled 2020-10-28 (×25): qty 100

## 2020-10-28 MED ORDER — MIRTAZAPINE 15 MG PO TABS
45.0000 mg | ORAL_TABLET | Freq: Every day | ORAL | Status: DC
  Administered 2020-10-28 – 2020-10-31 (×4): 45 mg via ORAL
  Filled 2020-10-28 (×4): qty 3

## 2020-10-28 MED ORDER — IPRATROPIUM-ALBUTEROL 0.5-2.5 (3) MG/3ML IN SOLN: 3.0000 mL | RESPIRATORY_TRACT | Status: DC | PRN

## 2020-10-28 MED ORDER — LEVETIRACETAM 500 MG PO TABS
500.0000 mg | ORAL_TABLET | Freq: Two times a day (BID) | ORAL | Status: DC
  Filled 2020-10-28: qty 1

## 2020-10-28 MED ORDER — METOPROLOL TARTRATE 5 MG/5ML IV SOLN: 5.0000 mg | Freq: Four times a day (QID) | INTRAVENOUS | Status: DC

## 2020-10-28 MED ORDER — IPRATROPIUM-ALBUTEROL 0.5-2.5 (3) MG/3ML IN SOLN
3.0000 mL | RESPIRATORY_TRACT | Status: DC | PRN
  Administered 2020-10-28 – 2020-11-14 (×6): 3 mL via RESPIRATORY_TRACT
  Filled 2020-10-28 (×7): qty 3

## 2020-10-28 MED ORDER — METOPROLOL TARTRATE 5 MG/5ML IV SOLN
5.0000 mg | Freq: Once | INTRAVENOUS | Status: AC | PRN
  Administered 2020-10-28: 5 mg via INTRAVENOUS
  Filled 2020-10-28: qty 5

## 2020-10-28 MED ORDER — LEVOTHYROXINE SODIUM 137 MCG PO TABS
137.0000 ug | ORAL_TABLET | Freq: Every day | ORAL | Status: DC
  Administered 2020-10-29 – 2020-10-31 (×3): 137 ug via ORAL
  Filled 2020-10-28 (×4): qty 1

## 2020-10-28 MED ORDER — METOPROLOL TARTRATE 5 MG/5ML IV SOLN
5.0000 mg | Freq: Once | INTRAVENOUS | Status: AC
  Administered 2020-10-28: 5 mg via INTRAVENOUS
  Filled 2020-10-28: qty 5

## 2020-10-28 MED ORDER — METOPROLOL TARTRATE 50 MG PO TABS
100.0000 mg | ORAL_TABLET | Freq: Every day | ORAL | Status: DC
  Administered 2020-10-28 – 2020-10-31 (×4): 100 mg via ORAL
  Filled 2020-10-28 (×4): qty 1

## 2020-10-28 MED ORDER — PANTOPRAZOLE SODIUM 40 MG PO TBEC
40.0000 mg | DELAYED_RELEASE_TABLET | Freq: Every day | ORAL | Status: DC
  Administered 2020-10-28 – 2020-10-31 (×4): 40 mg via ORAL
  Filled 2020-10-28 (×4): qty 1

## 2020-10-28 MED ORDER — DEXTROSE-NACL 5-0.45 % IV SOLN
INTRAVENOUS | Status: DC
  Administered 2020-10-31: 75 mL/h via INTRAVENOUS

## 2020-10-28 NOTE — Plan of Care (Signed)
  Problem: Education: Goal: Knowledge of General Education information will improve Description Including pain rating scale, medication(s)/side effects and non-pharmacologic comfort measures Outcome: Progressing   

## 2020-10-28 NOTE — Progress Notes (Signed)
   10/28/20 1722 10/28/20 1929  Assess: MEWS Score  Temp 99.2 F (37.3 C) 98.4 F (36.9 C)  BP (!) 158/76 (!) 151/75  Pulse Rate (!) 118 (!) 123  Resp 16 18  SpO2  --  98 %  O2 Device  --  Room Air  Assess: MEWS Score  MEWS Temp 0 0  MEWS Systolic 0 0  MEWS Pulse 2 2  MEWS RR 0 0  MEWS LOC 0 0  MEWS Score 2 2  MEWS Score Color Yellow Yellow  Assess: if the MEWS score is Yellow or Red  Were vital signs taken at a resting state?  --  Yes  Focused Assessment  --  No change from prior assessment  Early Detection of Sepsis Score *See Row Information*  --  Low  MEWS guidelines implemented *See Row Information*  --  Yes  Treat  MEWS Interventions  --  Escalated (See documentation below)  Take Vital Signs  Increase Vital Sign Frequency   --  Yellow: Q 2hr X 2 then Q 4hr X 2, if remains yellow, continue Q 4hrs  Escalate  MEWS: Escalate  --  Yellow: discuss with charge nurse/RN and consider discussing with provider and RRT

## 2020-10-28 NOTE — Progress Notes (Signed)
Physical Therapy Treatment Patient Details Name: [name] MRN: [id number] DOB: 1945-08-18 Today's Date: 10/28/2020    History of Present Illness The patient is a 75 year old female who presents with gastric cancer, intramucosal adenocarcinoma of pyloric antrum. Pt underwent robotic assisted distal gastrectomy on 6/28. PMH: obesity, hiatal hernia, CHF, CAD, h/o CVA    PT Comments    The pt was seen for progression of OOB mobility this session, but mobility was limited by multiple episodes of emesis and hypertension with mobility. The pt was nauseous through session, vomiting with each change in position and multiple times after returned to resting position in bed. The pt continues to require minA to mange bed mobility and initial sit-stands with RW. The pt was educated in exercises for LE that she can complete outside of PT session to maintain mobility. Will continue to benefit from skilled PT to progress strength and activity tolerance.   BP 121/57 (70) supine prior to session, increasing to 189/94 (116) sitting EOB; 160/147 and then 160/105 (118) standing; 174/64 (97) with chair position in bed at end of session. HR sustaining 120-140s with all mobility. NT present, RN alerted of vitals during this session.     Follow Up Recommendations  Home health PT;Supervision/Assistance - 24 hour     Equipment Recommendations  Rolling walker with 5" wheels    Recommendations for Other Services       Precautions / Restrictions Precautions Precautions: Fall Precaution Comments: originally orthostatic, hypertensive this session with x4 episodes of vomiting Restrictions Weight Bearing Restrictions: No    Mobility  Bed Mobility Overal bed mobility: Needs Assistance Bed Mobility: Rolling;Sidelying to Sit;Sit to Sidelying Rolling: Min assist Sidelying to sit: Min assist     Sit to sidelying: Min assist General bed mobility comments: minA to complete roll, complete trunk elevation.  pt able to return BLE to bed    Transfers Overall transfer level: Needs assistance Equipment used: Rolling walker (2 wheeled) Transfers: Sit to/from Stand Sit to Stand: Min assist         General transfer comment: minA to power up, cues for hand placement. no hypotension with oOB mobility this session, but pt reporting onset of dizziness and nausea with standing. completed x2 with BP increasing to 189/94 (116) and 160/105 (118).  Ambulation/Gait             General Gait Details: small lateral steps along EOB with minA to steady, pt able to manage RW      Balance Overall balance assessment: Needs assistance Sitting-balance support: Feet supported;No upper extremity supported Sitting balance-[name]: Fair     Standing balance support: Bilateral upper extremity supported Standing balance-[name]: Poor Standing balance comment: pt hypertensive, minA to steady                            Cognition Arousal/Alertness: Awake/alert Behavior During Therapy: WFL for tasks assessed/performed Overall Cognitive Status: Within Functional Limits for tasks assessed                                 General Comments: pt pleasant, agreeable despite discomfort, able to give good warning of needs  (ie: bucket for vomit)      Exercises General Exercises - Lower Extremity Ankle Circles/Pumps: AROM;Both;20 reps Quad Sets: AROM;Both;10 reps;Supine Heel Slides: AROM;Both;10 reps;Supine    General Comments General comments (skin integrity, edema, etc.):  BP 121/57 (70) supine prior to session; 189/94 (116) sitting EOB; 160/147 and then 160/105 (118) standing; 174/64 (97) with chair position in bed at end of session. pt with x4 episodes of vomiting during session. SpO2 steady on RA, HR sustaining 130bpm, (low of 120, high of 145bpm)      Pertinent Vitals/Pain Pain Assessment: Faces Faces Pain Scale: Hurts a little bit Pain Location: abdominal Pain  Descriptors / Indicators: Discomfort     PT Goals (current goals can now be found in the care plan section) Acute Rehab PT Goals Patient Stated Goal: reduce nausea PT Goal Formulation: With patient/family Potential to Achieve Goals: Good Progress towards PT goals: Progressing toward goals    Frequency    Min 3X/week      PT Plan Current plan remains appropriate       AM-PAC PT "6 Clicks" Mobility   Outcome Measure  Help needed turning from your back to your side while in a flat bed without using bedrails?: A Little Help needed moving from lying on your back to sitting on the side of a flat bed without using bedrails?: A Little Help needed moving to and from a bed to a chair (including a wheelchair)?: A Little Help needed standing up from a chair using your arms (e.g., wheelchair or bedside chair)?: A Little Help needed to walk in hospital room?: A Little Help needed climbing 3-5 steps with a railing? : A Lot 6 Click Score: 17    End of Session Equipment Utilized During Treatment: Gait belt Activity Tolerance: Treatment limited secondary to medical complications (Comment) (limited by nausea/vomiting, and hypertension) Patient left: in bed;with call bell/phone within reach;with bed alarm set;with nursing/sitter in room Nurse Communication: Mobility status PT Visit Diagnosis: Unsteadiness on feet (R26.81);Muscle weakness (generalized) (M62.81);Difficulty in walking, not elsewhere classified (R26.2)     Time: 1310-1336 PT Time Calculation (min) (ACUTE ONLY): 26 min  Charges:  $Therapeutic Exercise: 8-22 mins $Therapeutic Activity: 8-22 mins                     Karma Ganja, PT, DPT   Acute Rehabilitation Department Pager #: [phone]   Otho Bellows 10/28/2020, 1:54 PM

## 2020-10-28 NOTE — Progress Notes (Signed)
   10/28/20 1722  Assess: MEWS Score  Temp 99.2 F (37.3 C)  BP (!) 158/76  Pulse Rate (!) 118  Resp 16  SpO2 98 %  O2 Device Nasal Cannula  O2 Flow Rate (L/min) 2 L/min  Assess: MEWS Score  MEWS Temp 0  MEWS Systolic 0  MEWS Pulse 2  MEWS RR 0  MEWS LOC 0  MEWS Score 2  MEWS Score Color Yellow  Assess: if the MEWS score is Yellow or Red  Were vital signs taken at a resting state? Yes  Focused Assessment No change from prior assessment  Early Detection of Sepsis Score *See Row Information* Low  MEWS guidelines implemented *See Row Information* No, vital signs rechecked  Treat  MEWS Interventions Administered prn meds/treatments  Notify: Charge Nurse/RN  Name of Charge Nurse/RN Notified Derald Macleod, RN  Date Charge Nurse/RN Notified 10/28/20  Time Charge Nurse/RN Notified 1830  Notify: Provider  Provider Name/Title Ralene Ok, MD  Date Provider Notified 10/28/20  Time Provider Notified 1800  Notification Type Page  Notification Reason Change in status  Provider response See new orders  Date of Provider Response 10/28/20  Time of Provider Response [id number]

## 2020-10-28 NOTE — Progress Notes (Signed)
Notified by nursing patient with new N/V after taking in clears following negative UGI this am. EKG with sinus tach. KUB pending. I evaluated patient at bedside and she states her pain is actually significantly improved despite N/V. Episodes of emesis x2 while I was in the room - pink tinged mucous.  Will go back to NPO and continue management with antiemetics.

## 2020-10-28 NOTE — Progress Notes (Signed)
   10/28/20 1313  Assess: MEWS Score  Temp 98.8 F (37.1 C)  BP (!) 157/70  Pulse Rate (!) 121  Resp 20  Level of Consciousness Alert  SpO2 100 %  Assess: MEWS Score  MEWS Temp 0  MEWS Systolic 0  MEWS Pulse 2  MEWS RR 0  MEWS LOC 0  MEWS Score 2  MEWS Score Color Yellow

## 2020-10-28 NOTE — Progress Notes (Signed)
Pt resting comfortably in bed with tv on.

## 2020-10-28 NOTE — Progress Notes (Signed)
   10/28/20 1313 10/28/20 1354 10/28/20 1413  Assess: MEWS Score  Temp  --   --  98.2 F (36.8 C)  BP  --   --  (!) 169/87  Pulse Rate (!) 121 (!) 130 (!) 101  Resp  --   --  19  Level of Consciousness  --   --  Alert  SpO2  --   --  95 %  Assess: MEWS Score  MEWS Temp 0 0 0  MEWS Systolic 0 0 0  MEWS Pulse 2 3 1   MEWS RR 0 0 0  MEWS LOC 0 0 0  MEWS Score 2 3 1   MEWS Score Color Yellow Yellow Green  Assess: if the MEWS score is Yellow or Red  Were vital signs taken at a resting state?  --   --  Yes  Focused Assessment  --   --  No change from prior assessment  Early Detection of Sepsis Score *See Row Information*  --   --  Low  MEWS guidelines implemented *See Row Information*  --   --  No, vital signs rechecked  Document  Patient Outcome  --   --  Stabilized after interventions

## 2020-10-28 NOTE — Progress Notes (Signed)
3 Days Post-Op   Subjective/Chief Complaint:  Upper GI negative for leak yesterday.  Cr finally going down.  No more syncope.    Objective: Vital signs in last 24 hours: Temp:  [97.9 F (36.6 C)-99.5 F (37.5 C)] 97.9 F (36.6 C) (07/01 0452) Pulse Rate:  [103-111] 106 (07/01 0452) Resp:  [17-20] 20 (07/01 0452) BP: (127-158)/(54-88) 158/71 (07/01 0452) SpO2:  [96 %-98 %] 96 % (07/01 0452) Last BM Date: 10/24/20  Intake/Output from previous day: 06/30 0701 - 07/01 0700 In: 0  Out: 500 [Urine:500] Intake/Output this shift: No intake/output data recorded.   General appearance: alert, cooperative, and no distress Resp: breathing comfortably GI: soft, non tender, non distended.  Extremities: extremities normal, atraumatic, no cyanosis or edema   Lab Results:  Recent Labs    10/27/20 0208 10/28/20 0447  WBC 18.7* 19.2*  HGB 9.5* 10.5*  HCT 30.8* 34.6*  PLT 155 223   BMET Recent Labs    10/27/20 0208 10/28/20 0151  NA 134* 135  K 4.8 5.3*  CL 104 105  CO2 22 22  GLUCOSE 102* 184*  BUN 18 21  CREATININE 2.03* 1.73*  CALCIUM 8.0* 8.3*   PT/INR No results for input(s): LABPROT, INR in the last 72 hours. ABG No results for input(s): PHART, HCO3 in the last 72 hours.  Invalid input(s): PCO2, PO2  Studies/Results: DG UGI W SINGLE CM (SOL OR THIN BA)  Result Date: 10/27/2020 CLINICAL DATA:  Distal gastrectomy with loop gastrojejunostomy, evaluate for leak EXAM: WATER SOLUBLE UPPER GI SERIES TECHNIQUE: Single-column upper GI series was performed using water soluble contrast. CONTRAST:  117mL OMNIPAQUE IOHEXOL 300 MG/ML  SOLN COMPARISON:  Abdominal radiograph 10/24/2020 FLUOROSCOPY TIME:  Fluoroscopy Time:  1 minutes 42 seconds Radiation Exposure Index (if provided by the fluoroscopic device): 53.1 mGy Number of Acquired Spot Images: 5 FINDINGS: Initial scout radiograph demonstrates postsurgical changes in the left upper quadrant and left basilar subsegmental  atelectasis. There is a nonobstructive bowel gas pattern. The patient was instructed to swallow 100 mL of Omnipaque 300 water-soluble contrast, which was done without difficulty. Contrast progressed into the stomach without difficulty. Upper GI anatomy is consistent with distal gastrectomy with gastrojejunostomy. There is no evidence of extraluminal contrast extravasation. Contrast progresses through the efferent limb/proximal jejunum. There is no evidence of reflux into the afferent limb. IMPRESSION: Postsurgical changes of distal gastrectomy with loop gastrojejunostomy. No evidence of contrast leak. Electronically Signed   By: Maurine Simmering   On: 10/27/2020 14:23    Anti-infectives: Anti-infectives (From admission, onward)    Start     Dose/Rate Route Frequency Ordered Stop   10/02/2020 2100  ciprofloxacin (CIPRO) IVPB 400 mg        400 mg 200 mL/hr over 60 Minutes Intravenous Every 12 hours 10/10/2020 1258 10/08/2020 2148   10/13/2020 0600  ciprofloxacin (CIPRO) IVPB 400 mg        400 mg 200 mL/hr over 60 Minutes Intravenous On call to O.R. 10/22/2020 0547 10/10/2020 0847       Assessment/Plan: s/p Procedure(s): XI ROBOTIC ASSISTED DISTAL GASTRECTOMY (N/A)  Pathology from surgery 6/28, low grade dysplasia.  No cancer and 13 negative nodes.    FENa was low Continue IV fluids 1 more day. Restart some additional home meds. Will need HH PT.      LOS: 3 days  Milus Height, MD FACS Surgical Oncology, General Surgery, Trauma and [place] [facility], [place] for weekday/non holidays Check [email or web address] for  coverage night/weekend/holidays  Do not use SecureChat as it is not reliable for timely patient care.

## 2020-10-28 NOTE — Progress Notes (Signed)
   10/28/20 1313 10/28/20 1354  Assess: MEWS Score  Pulse Rate (!) 121 (!) 130  SpO2  --  99 %  O2 Device  --  Room Air  Assess: MEWS Score  MEWS Temp 0 0  MEWS Systolic 0 0  MEWS Pulse 2 3  MEWS RR 0 0  MEWS LOC 0 0  MEWS Score 2 3  MEWS Score Color Yellow Yellow  Assess: if the MEWS score is Yellow or Red  Were vital signs taken at a resting state?  --  Yes  Focused Assessment  --  Change from prior assessment (see assessment flowsheet)  Early Detection of Sepsis Score *See Row Information*  --  Low  MEWS guidelines implemented *See Row Information*  --  No, vital signs rechecked  Treat  MEWS Interventions  --  Administered prn meds/treatments  Pain Scale  --  0-10  Pain Score  --  6 (lowest it's been when she isn't sleeping in 2 days)  Pain Type  --  Surgical pain  Pain Intervention(s)  --  Medication (See eMAR)  Notify: Charge Nurse/RN  Name of Charge Nurse/RN Notified  --  Derald Macleod, RN  Date Charge Nurse/RN Notified  --  10/28/20  Time Charge Nurse/RN Notified  --  1400  Notify: Provider  Provider Name/Title  --  Alferd Apa (PA with Dr. Barry Dienes)  Date Provider Notified  --  10/28/20  Time Provider Notified  --  1400  Notification Type  --  Page  Notification Reason  --  Change in status  Provider response  --  See new orders  Date of Provider Response  --  10/28/20  Time of Provider Response  --  2355

## 2020-10-28 NOTE — Progress Notes (Addendum)
Pt had 2 episodes of emesis before and after PT.  Yellow MEWS d/t pulse 120s. Called MD to notify of emesis and pulse.  Gillis, Barth Kirks, returned page. New orders.

## 2020-10-28 NOTE — Progress Notes (Signed)
Pt resting comfortably with tv on in bed.

## 2020-10-28 DEATH — deceased

## 2020-10-29 LAB — BASIC METABOLIC PANEL
Anion gap: 9 (ref 5–15)
BUN: 23 mg/dL (ref 8–23)
CO2: 21 mmol/L — ABNORMAL LOW (ref 22–32)
Calcium: 8.7 mg/dL — ABNORMAL LOW (ref 8.9–10.3)
Chloride: 105 mmol/L (ref 98–111)
Creatinine, Ser: 1.3 mg/dL — ABNORMAL HIGH (ref 0.44–1.00)
GFR, Estimated: 42 mL/min — ABNORMAL LOW (ref >60)
Glucose, Bld: 126 mg/dL — ABNORMAL HIGH (ref 70–99)
Potassium: 4.9 mmol/L (ref 3.5–5.1)
Sodium: 135 mmol/L (ref 135–145)

## 2020-10-29 LAB — CBC
HCT: 27.8 % — ABNORMAL LOW (ref 36.0–46.0)
Hemoglobin: 8.8 g/dL — ABNORMAL LOW (ref 12.0–15.0)
MCH: 29.9 pg (ref 26.0–34.0)
MCHC: 31.7 g/dL (ref 30.0–36.0)
MCV: 94.6 fL (ref 80.0–100.0)
Platelets: UNDETERMINED 10*3/uL (ref 150–400)
RBC: 2.94 MIL/uL — ABNORMAL LOW (ref 3.87–5.11)
RDW: 14.8 % (ref 11.5–15.5)
WBC: 18.1 10*3/uL — ABNORMAL HIGH (ref 4.0–10.5)
nRBC: 0 % (ref 0.0–0.2)

## 2020-10-29 LAB — GLUCOSE, CAPILLARY
Glucose-Capillary: 176 mg/dL — ABNORMAL HIGH (ref 70–99)
Glucose-Capillary: 178 mg/dL — ABNORMAL HIGH (ref 70–99)
Glucose-Capillary: 158 mg/dL — ABNORMAL HIGH (ref 70–99)
Glucose-Capillary: 128 mg/dL — ABNORMAL HIGH (ref 70–99)

## 2020-10-29 MED ORDER — FLUTICASONE PROPIONATE 50 MCG/ACT NA SUSP
2.0000 | Freq: Every day | NASAL | Status: DC | PRN
  Filled 2020-10-29: qty 16

## 2020-10-29 MED ORDER — TRAZODONE HCL 50 MG PO TABS
50.0000 mg | ORAL_TABLET | Freq: Every day | ORAL | Status: DC
  Administered 2020-10-29 – 2020-10-31 (×3): 50 mg via ORAL
  Filled 2020-10-29 (×3): qty 1

## 2020-10-29 MED ORDER — PANTOPRAZOLE SODIUM 40 MG PO TBEC: 40.0000 mg | DELAYED_RELEASE_TABLET | Freq: Every morning | ORAL | Status: DC

## 2020-10-29 MED ORDER — AMLODIPINE BESYLATE 5 MG PO TABS
5.0000 mg | ORAL_TABLET | Freq: Every day | ORAL | Status: DC
  Administered 2020-10-29 – 2020-10-31 (×3): 5 mg via ORAL
  Filled 2020-10-29 (×3): qty 1

## 2020-10-29 MED ORDER — FUROSEMIDE 40 MG PO TABS
40.0000 mg | ORAL_TABLET | Freq: Every day | ORAL | Status: DC
  Administered 2020-10-29 – 2020-10-31 (×3): 40 mg via ORAL
  Filled 2020-10-29 (×3): qty 1

## 2020-10-29 MED ORDER — ALPRAZOLAM 0.25 MG PO TABS
0.2500 mg | ORAL_TABLET | Freq: Three times a day (TID) | ORAL | Status: DC | PRN
  Filled 2020-10-29: qty 1

## 2020-10-29 MED ORDER — POTASSIUM CHLORIDE CRYS ER 20 MEQ PO TBCR
20.0000 meq | EXTENDED_RELEASE_TABLET | Freq: Every morning | ORAL | Status: DC
  Administered 2020-10-29 – 2020-10-30 (×2): 20 meq via ORAL
  Filled 2020-10-29 (×2): qty 1

## 2020-10-29 MED ORDER — TRAMADOL HCL 50 MG PO TABS
100.0000 mg | ORAL_TABLET | Freq: Every day | ORAL | Status: DC
Start: 2020-10-29 — End: 2020-11-01
  Administered 2020-10-29 – 2020-10-31 (×3): 100 mg via ORAL
  Filled 2020-10-29 (×3): qty 2

## 2020-10-29 MED ORDER — IRBESARTAN 300 MG PO TABS
300.0000 mg | ORAL_TABLET | Freq: Every day | ORAL | Status: DC
  Administered 2020-10-29 – 2020-10-31 (×3): 300 mg via ORAL
  Filled 2020-10-29 (×4): qty 1

## 2020-10-29 NOTE — Progress Notes (Signed)
Progress Note  4 Days Post-Op  Subjective: Patient reports some nausea overnight but also had a watery BM this AM. She reports feeling hungry this AM as well as nauseated. She is asking when she might go home.   Objective: Vital signs in last 24 hours: Temp:  [98 F (36.7 C)-99.2 F (37.3 C)] 98 F (36.7 C) (07/02 0352) Pulse Rate:  [94-130] 94 (07/02 0352) Resp:  [16-20] 18 (07/02 0352) BP: (151-171)/(70-87) 171/81 (07/02 0352) SpO2:  [95 %-100 %] 95 % (07/01 2200) Last BM Date: 10/28/20  Intake/Output from previous day: 07/01 0701 - 07/02 0700 In: 120 [P.O.:120] Out: 300 [Urine:300] Intake/Output this shift: No intake/output data recorded.  PE: General: pleasant, WD, obese female who is laying in bed in NAD Heart: regular, rate, and rhythm.   Lungs: CTAB, no wheezes, rhonchi, or rales noted.  Respiratory effort nonlabored Abd: soft, NT, ND, +BS, incisions C/D/I    Lab Results:  Recent Labs    10/28/20 1520 10/29/20 0316  WBC 27.9* 18.1*  HGB 9.3* 8.8*  HCT 29.6* 27.8*  PLT 201 PLATELET CLUMPS NOTED ON SMEAR, UNABLE TO ESTIMATE   BMET Recent Labs    10/28/20 1520 10/29/20 0316  NA 133* 135  K 4.7 4.9  CL 102 105  CO2 19* 21*  GLUCOSE 229* 126*  BUN 22 23  CREATININE 1.76* 1.30*  CALCIUM 8.4* 8.7*   PT/INR No results for input(s): LABPROT, INR in the last 72 hours. CMP     Component Value Date/Time   NA 135 10/29/2020 0316   K 4.9 10/29/2020 0316   CL 105 10/29/2020 0316   CO2 21 (L) 10/29/2020 0316   GLUCOSE 126 (H) 10/29/2020 0316   BUN 23 10/29/2020 0316   CREATININE 1.30 (H) 10/29/2020 0316   CALCIUM 8.7 (L) 10/29/2020 0316   PROT 7.2 10/19/2020 1150   ALBUMIN 3.8 10/19/2020 1150   AST 23 10/19/2020 1150   ALT 16 10/19/2020 1150   ALKPHOS 73 10/19/2020 1150   BILITOT 0.2 (L) 10/19/2020 1150   GFRNONAA 42 (L) 10/29/2020 0316   GFRAA 58 (L) 02/08/2017 1025   Lipase     Component Value Date/Time   LIPASE 29 07/17/2007 2035        Studies/Results: DG CHEST PORT 1 VIEW  Result Date: 10/29/2020 CLINICAL DATA:  Shortness of breath EXAM: PORTABLE CHEST 1 VIEW COMPARISON:  11/30/2009 FINDINGS: Cardiac enlargement. No vascular congestion, edema, or consolidation. No pleural effusions. No pneumothorax. Calcified and tortuous aorta. Postoperative changes in the cervical spine. IMPRESSION: Cardiac enlargement.  No evidence of active pulmonary disease. Electronically Signed   By: Lucienne Capers M.D.   On: 10/29/2020 00:10   DG Abd Portable 1V  Result Date: 10/28/2020 CLINICAL DATA:  Emesis EXAM: PORTABLE ABDOMEN - 1 VIEW COMPARISON:  10/27/2020 FINDINGS: Persistent contrast material is noted within the stomach and proximal small bowel similar to that seen on recent upper GI. The degree of small-bowel dilatation is stable. No significant progression of contrast material distally is noted. IMPRESSION: Retained contrast material from previous upper GI within the stomach and proximal small bowel. Mild small bowel dilatation is noted and no significant progression of contrast distally is seen. Electronically Signed   By: Inez Catalina M.D.   On: 10/28/2020 15:58   DG UGI W SINGLE CM (SOL OR THIN BA)  Result Date: 10/27/2020 CLINICAL DATA:  Distal gastrectomy with loop gastrojejunostomy, evaluate for leak EXAM: WATER SOLUBLE UPPER GI SERIES TECHNIQUE: Single-column upper GI  series was performed using water soluble contrast. CONTRAST:  155mL OMNIPAQUE IOHEXOL 300 MG/ML  SOLN COMPARISON:  Abdominal radiograph 10/06/2020 FLUOROSCOPY TIME:  Fluoroscopy Time:  1 minutes 42 seconds Radiation Exposure Index (if provided by the fluoroscopic device): 53.1 mGy Number of Acquired Spot Images: 5 FINDINGS: Initial scout radiograph demonstrates postsurgical changes in the left upper quadrant and left basilar subsegmental atelectasis. There is a nonobstructive bowel gas pattern. The patient was instructed to swallow 100 mL of Omnipaque 300  water-soluble contrast, which was done without difficulty. Contrast progressed into the stomach without difficulty. Upper GI anatomy is consistent with distal gastrectomy with gastrojejunostomy. There is no evidence of extraluminal contrast extravasation. Contrast progresses through the efferent limb/proximal jejunum. There is no evidence of reflux into the afferent limb. IMPRESSION: Postsurgical changes of distal gastrectomy with loop gastrojejunostomy. No evidence of contrast leak. Electronically Signed   By: Maurine Simmering   On: 10/27/2020 14:23    Anti-infectives: Anti-infectives (From admission, onward)    Start     Dose/Rate Route Frequency Ordered Stop   10/15/2020 2100  ciprofloxacin (CIPRO) IVPB 400 mg        400 mg 200 mL/hr over 60 Minutes Intravenous Every 12 hours 10/26/2020 1258 10/23/2020 2148   10/06/2020 0600  ciprofloxacin (CIPRO) IVPB 400 mg        400 mg 200 mL/hr over 60 Minutes Intravenous On call to O.R. 10/24/2020 0547 10/03/2020 0847        Assessment/Plan s/p Procedure(s): XI ROBOTIC ASSISTED DISTAL GASTRECTOMY (N/A)   Pathology from surgery 6/28, low grade dysplasia.  No cancer and 13 negative nodes.     Still having some nausea and pain. Ok to have some clears but would not advance past this today. Will discuss possible addition of reglan with MD.   LOS: 4 days    Norm Parcel, [facility] Surgery 10/29/2020, 9:25 AM Please see Amion for pager number during day hours 7:00am-4:30pm

## 2020-10-29 NOTE — Plan of Care (Signed)

## 2020-10-30 LAB — CBC
HCT: 30.2 % — ABNORMAL LOW (ref 36.0–46.0)
Hemoglobin: 9.6 g/dL — ABNORMAL LOW (ref 12.0–15.0)
MCH: 29.4 pg (ref 26.0–34.0)
MCHC: 31.8 g/dL (ref 30.0–36.0)
MCV: 92.4 fL (ref 80.0–100.0)
Platelets: 233 10*3/uL (ref 150–400)
RBC: 3.27 MIL/uL — ABNORMAL LOW (ref 3.87–5.11)
RDW: 14.7 % (ref 11.5–15.5)
WBC: 21.2 10*3/uL — ABNORMAL HIGH (ref 4.0–10.5)
nRBC: 0.1 % (ref 0.0–0.2)

## 2020-10-30 LAB — BASIC METABOLIC PANEL
Anion gap: 8 (ref 5–15)
BUN: 23 mg/dL (ref 8–23)
CO2: 23 mmol/L (ref 22–32)
Calcium: 8.5 mg/dL — ABNORMAL LOW (ref 8.9–10.3)
Chloride: 103 mmol/L (ref 98–111)
Creatinine, Ser: 1.17 mg/dL — ABNORMAL HIGH (ref 0.44–1.00)
GFR, Estimated: 48 mL/min — ABNORMAL LOW (ref >60)
Glucose, Bld: 174 mg/dL — ABNORMAL HIGH (ref 70–99)
Potassium: 4.6 mmol/L (ref 3.5–5.1)
Sodium: 134 mmol/L — ABNORMAL LOW (ref 135–145)

## 2020-10-30 LAB — GLUCOSE, CAPILLARY
Glucose-Capillary: 162 mg/dL — ABNORMAL HIGH (ref 70–99)
Glucose-Capillary: 159 mg/dL — ABNORMAL HIGH (ref 70–99)
Glucose-Capillary: 174 mg/dL — ABNORMAL HIGH (ref 70–99)
Glucose-Capillary: 140 mg/dL — ABNORMAL HIGH (ref 70–99)

## 2020-10-30 NOTE — Progress Notes (Signed)
Progress Note  5 Days Post-Op  Subjective: Patient reports 1 episode of emesis yesterday but none this AM and denies nausea currently. She denies flatus or BM this AM. Pain well controlled currently.   Objective: Vital signs in last 24 hours: Temp:  [98 F (36.7 C)-98.4 F (36.9 C)] 98.4 F (36.9 C) (07/03 0528) Pulse Rate:  [82-99] 99 (07/03 0607) Resp:  [15-16] 16 (07/03 0528) BP: (158-196)/(74-96) 158/76 (07/03 0607) SpO2:  [95 %-98 %] 95 % (07/03 0607) Last BM Date: 10/29/20  Intake/Output from previous day: 07/02 0701 - 07/03 0700 In: 2134.8 [P.O.:60; I.V.:1774.8; IV Piggyback:300] Out: 310 [Urine:250; Emesis/NG output:60] Intake/Output this shift: Total I/O In: 120 [P.O.:120] Out: -   PE: General: pleasant, WD, obese female who is laying in bed in NAD Heart: regular, rate, and rhythm.   Lungs: CTAB, no wheezes, rhonchi, or rales noted.  Respiratory effort nonlabored Abd: soft, NT, ND, +BS, incisions C/D/I   Lab Results:  Recent Labs    10/29/20 0316 10/30/20 0048  WBC 18.1* 21.2*  HGB 8.8* 9.6*  HCT 27.8* 30.2*  PLT PLATELET CLUMPS NOTED ON SMEAR, UNABLE TO ESTIMATE 233   BMET Recent Labs    10/29/20 0316 10/30/20 0048  NA 135 134*  K 4.9 4.6  CL 105 103  CO2 21* 23  GLUCOSE 126* 174*  BUN 23 23  CREATININE 1.30* 1.17*  CALCIUM 8.7* 8.5*   PT/INR No results for input(s): LABPROT, INR in the last 72 hours. CMP     Component Value Date/Time   NA 134 (L) 10/30/2020 0048   K 4.6 10/30/2020 0048   CL 103 10/30/2020 0048   CO2 23 10/30/2020 0048   GLUCOSE 174 (H) 10/30/2020 0048   BUN 23 10/30/2020 0048   CREATININE 1.17 (H) 10/30/2020 0048   CALCIUM 8.5 (L) 10/30/2020 0048   PROT 7.2 10/19/2020 1150   ALBUMIN 3.8 10/19/2020 1150   AST 23 10/19/2020 1150   ALT 16 10/19/2020 1150   ALKPHOS 73 10/19/2020 1150   BILITOT 0.2 (L) 10/19/2020 1150   GFRNONAA 48 (L) 10/30/2020 0048   GFRAA 58 (L) 02/08/2017 1025   Lipase     Component  Value Date/Time   LIPASE 29 07/17/2007 2035       Studies/Results: DG CHEST PORT 1 VIEW  Result Date: 10/29/2020 CLINICAL DATA:  Shortness of breath EXAM: PORTABLE CHEST 1 VIEW COMPARISON:  11/30/2009 FINDINGS: Cardiac enlargement. No vascular congestion, edema, or consolidation. No pleural effusions. No pneumothorax. Calcified and tortuous aorta. Postoperative changes in the cervical spine. IMPRESSION: Cardiac enlargement.  No evidence of active pulmonary disease. Electronically Signed   By: Lucienne Capers M.D.   On: 10/29/2020 00:10   DG Abd Portable 1V  Result Date: 10/28/2020 CLINICAL DATA:  Emesis EXAM: PORTABLE ABDOMEN - 1 VIEW COMPARISON:  10/27/2020 FINDINGS: Persistent contrast material is noted within the stomach and proximal small bowel similar to that seen on recent upper GI. The degree of small-bowel dilatation is stable. No significant progression of contrast material distally is noted. IMPRESSION: Retained contrast material from previous upper GI within the stomach and proximal small bowel. Mild small bowel dilatation is noted and no significant progression of contrast distally is seen. Electronically Signed   By: Inez Catalina M.D.   On: 10/28/2020 15:58    Anti-infectives: Anti-infectives (From admission, onward)    Start     Dose/Rate Route Frequency Ordered Stop   10/15/2020 2100  ciprofloxacin (CIPRO) IVPB 400 mg  400 mg 200 mL/hr over 60 Minutes Intravenous Every 12 hours 09/29/2020 1258 09/30/2020 2148   10/05/2020 0600  ciprofloxacin (CIPRO) IVPB 400 mg        400 mg 200 mL/hr over 60 Minutes Intravenous On call to O.R. 10/19/2020 0547 10/18/2020 0847        Assessment/Plan s/p Procedure(s): XI ROBOTIC ASSISTED DISTAL GASTRECTOMY (N/A)   Pathology from surgery 6/28, low grade dysplasia.  No cancer and 13 negative nodes.     One episode n/v yesterday but none this AM. Ok to try FLD today and see how she tolerates. Wean O2 as able   LOS: 5 days    Norm Parcel, [facility] Surgery 10/30/2020, 10:42 AM Please see Amion for pager number during day hours 7:00am-4:30pm

## 2020-10-31 LAB — BASIC METABOLIC PANEL
Anion gap: 7 (ref 5–15)
BUN: 19 mg/dL (ref 8–23)
CO2: 20 mmol/L — ABNORMAL LOW (ref 22–32)
Calcium: 8.5 mg/dL — ABNORMAL LOW (ref 8.9–10.3)
Chloride: 105 mmol/L (ref 98–111)
Creatinine, Ser: 1.05 mg/dL — ABNORMAL HIGH (ref 0.44–1.00)
GFR, Estimated: 55 mL/min — ABNORMAL LOW (ref >60)
Glucose, Bld: 146 mg/dL — ABNORMAL HIGH (ref 70–99)
Potassium: 4.6 mmol/L (ref 3.5–5.1)
Sodium: 132 mmol/L — ABNORMAL LOW (ref 135–145)

## 2020-10-31 LAB — CBC
HCT: 29.2 % — ABNORMAL LOW (ref 36.0–46.0)
Hemoglobin: 9.1 g/dL — ABNORMAL LOW (ref 12.0–15.0)
MCH: 29.1 pg (ref 26.0–34.0)
MCHC: 31.2 g/dL (ref 30.0–36.0)
MCV: 93.3 fL (ref 80.0–100.0)
Platelets: 246 10*3/uL (ref 150–400)
RBC: 3.13 MIL/uL — ABNORMAL LOW (ref 3.87–5.11)
RDW: 15 % (ref 11.5–15.5)
WBC: 19.2 10*3/uL — ABNORMAL HIGH (ref 4.0–10.5)
nRBC: 0.2 % (ref 0.0–0.2)

## 2020-10-31 LAB — GLUCOSE, CAPILLARY
Glucose-Capillary: 150 mg/dL — ABNORMAL HIGH (ref 70–99)
Glucose-Capillary: 143 mg/dL — ABNORMAL HIGH (ref 70–99)
Glucose-Capillary: 137 mg/dL — ABNORMAL HIGH (ref 70–99)

## 2020-10-31 NOTE — Progress Notes (Signed)
   Progress Note  6 Days Post-Op  Subjective: Patient reports n/v overnight. Her day RN reports it is more like small amounts of mucus that she is spitting up. Patient reports some soreness in sides. +flatus, no BM today. Husband at bedside this AM.   Objective: Vital signs in last 24 hours: Temp:  [97.5 F (36.4 C)-97.9 F (36.6 C)] 97.5 F (36.4 C) (07/04 0349) Pulse Rate:  [89-103] 89 (07/04 0349) Resp:  [17-20] 20 (07/04 0349) BP: (123-162)/(53-77) 123/53 (07/04 0349) SpO2:  [95 %-97 %] 95 % (07/04 0349) Last BM Date: 10/29/20  Intake/Output from previous day: 07/03 0701 - 07/04 0700 In: 1032.5 [P.O.:320; I.V.:712.5] Out: 190 [Urine:150; Emesis/NG output:40] Intake/Output this shift: Total I/O In: 100 [P.O.:100] Out: -   PE: General: pleasant, WD, obese female who is laying in bed in NAD Heart: regular, rate, and rhythm.   Lungs: CTAB, no wheezes, rhonchi, or rales noted.  Respiratory effort nonlabored Abd: soft, NT, ND, +BS, incisions C/D/I   Lab Results:  Recent Labs    10/30/20 0048 10/31/20 0042  WBC 21.2* 19.2*  HGB 9.6* 9.1*  HCT 30.2* 29.2*  PLT 233 246   BMET Recent Labs    10/30/20 0048 10/31/20 0042  NA 134* 132*  K 4.6 4.6  CL 103 105  CO2 23 20*  GLUCOSE 174* 146*  BUN 23 19  CREATININE 1.17* 1.05*  CALCIUM 8.5* 8.5*   PT/INR No results for input(s): LABPROT, INR in the last 72 hours. CMP     Component Value Date/Time   NA 132 (L) 10/31/2020 0042   K 4.6 10/31/2020 0042   CL 105 10/31/2020 0042   CO2 20 (L) 10/31/2020 0042   GLUCOSE 146 (H) 10/31/2020 0042   BUN 19 10/31/2020 0042   CREATININE 1.05 (H) 10/31/2020 0042   CALCIUM 8.5 (L) 10/31/2020 0042   PROT 7.2 10/19/2020 1150   ALBUMIN 3.8 10/19/2020 1150   AST 23 10/19/2020 1150   ALT 16 10/19/2020 1150   ALKPHOS 73 10/19/2020 1150   BILITOT 0.2 (L) 10/19/2020 1150   GFRNONAA 55 (L) 10/31/2020 0042   GFRAA 58 (L) 02/08/2017 1025   Lipase     Component Value  Date/Time   LIPASE 29 07/17/2007 2035       Studies/Results: No results found.  Anti-infectives: Anti-infectives (From admission, onward)    Start     Dose/Rate Route Frequency Ordered Stop   10/09/2020 2100  ciprofloxacin (CIPRO) IVPB 400 mg        400 mg 200 mL/hr over 60 Minutes Intravenous Every 12 hours 10/23/2020 1258 10/11/2020 2148   10/19/2020 0600  ciprofloxacin (CIPRO) IVPB 400 mg        400 mg 200 mL/hr over 60 Minutes Intravenous On call to O.R. 10/02/2020 0547 10/09/2020 0847        Assessment/Plan s/p Procedure(s): XI ROBOTIC ASSISTED DISTAL GASTRECTOMY (N/A)   Pathology from surgery 6/28, low grade dysplasia.  No cancer and 13 negative nodes.     Pt reported more n/v. RN reports patient more spitting up thick mucus. Will discuss with MD. NPO with ice chips/sips for now. May need further imaging   LOS: 6 days    Norm Parcel, [facility] Surgery 10/31/2020, 11:07 AM Please see Amion for pager number during day hours 7:00am-4:30pm

## 2020-10-31 NOTE — Progress Notes (Signed)
Physical Therapy Treatment Patient Details Name: [name] MRN: [id number] DOB: 1946/02/25 Today's Date: 10/31/2020    History of Present Illness The patient is a 75 year old female who presents with gastric cancer, intramucosal adenocarcinoma of pyloric antrum. Pt underwent robotic assisted distal gastrectomy on 6/28. PMH: obesity, hiatal hernia, CHF, CAD, h/o CVA.    PT Comments    Pt received in supine, agreeable to therapy session, spouse present and supportive throughout. Pt limited due to continued episodes nausea/vomiting with transfers and possible orthostatic hypotension (BP 156/89 seated prior to standing and BP 127/113 seated on BSC after stand pivot transfer. Pt needing up to minA +1-2 for bed mobility and transfers and mainly limited due to decreased activity tolerance and nausea. Reviewed LE HEP, pt could benefit from handout next session for continued compliance. Pt continues to benefit from PT services to progress toward functional mobility goals.    Follow Up Recommendations  Home health PT;Supervision/Assistance - 24 hour     Equipment Recommendations  Rolling walker with 5" wheels    Recommendations for Other Services       Precautions / Restrictions Precautions Precautions: Fall Precaution Comments: episodes n/v with transfers, monitor BP (HTN vs orthostasis with transfers); abdominal sx Restrictions Weight Bearing Restrictions: No    Mobility  Bed Mobility Overal bed mobility: Needs Assistance Bed Mobility: Rolling;Sidelying to Sit Rolling: Min guard Sidelying to sit: Min assist       General bed mobility comments: minA to complete roll, complete trunk elevation. pt agreeable to sit up in chair at end of session    Transfers Overall transfer level: Needs assistance Equipment used: Rolling walker (2 wheeled) Transfers: Sit to/from Stand Sit to Stand: Min assist Stand pivot transfers: Min assist;+2 safety/equipment       General transfer  comment: minA to power up, cues for hand placement. pt denies dizziness with standing but endorses nausea with each positional change. UTA standing BP due to nausea/fatigue and urinary urgency.  Ambulation/Gait Ambulation/Gait assistance: Min assist;+2 safety/equipment Gait Distance (Feet): 4 Feet Assistive device: Rolling walker (2 wheeled) Gait Pattern/deviations: Step-to pattern;Shuffle     General Gait Details: small shuffled steps to [facility] then to chair from [facility], 2-87ft at a time. nausea/fatigue limiting   Stairs             Wheelchair Mobility    Modified Rankin (Stroke Patients Only)       Balance Overall balance assessment: Needs assistance Sitting-balance support: Feet supported;No upper extremity supported Sitting balance-[name]: Fair     Standing balance support: Bilateral upper extremity supported Standing balance-[name]: Poor Standing balance comment: minA and RW for steadying; sxs nausea/urinary urgency limiting standing assessment                            Cognition Arousal/Alertness: Awake/alert Behavior During Therapy: WFL for tasks assessed/performed Overall Cognitive Status: Within Functional Limits for tasks assessed                                 General Comments: pt pleasant, agreeable despite discomfort, able to give good warning of needs  (ie: bucket for vomit)      Exercises General Exercises - Lower Extremity Ankle Circles/Pumps: AROM;Both;20 reps Long Arc Quad: AROM;Both;10 reps;Seated Heel Slides: AROM;Both;10 reps;Supine Hip ABduction/ADduction: AROM;Both;10 reps;Supine Hip Flexion/Marching: AROM;Both;10 reps;Seated    General Comments General comments (skin integrity,  edema, etc.): BP 126/78 (85) supine, BP 156/89 (109) seated EOB (after n/v episode); BP 127/113 (120) seated on BSC after stand pivot (may be orthostatic in stance? Check standing BP next date); SpO2 93-94% on 2.5L O2 [place], HR 90's bpm  with exertion      Pertinent Vitals/Pain Pain Assessment: Faces Faces Pain Scale: Hurts a little bit Pain Location: abdominal Pain Descriptors / Indicators: Discomfort Pain Intervention(s): Monitored during session;Repositioned;Premedicated before session;Limited activity within patient's tolerance    Home Living                      Prior Function            PT Goals (current goals can now be found in the care plan section) Acute Rehab PT Goals Patient Stated Goal: reduce nausea PT Goal Formulation: With patient/family Potential to Achieve Goals: Good Progress towards PT goals: Progressing toward goals    Frequency    Min 3X/week      PT Plan Current plan remains appropriate    Co-evaluation              AM-PAC PT "6 Clicks" Mobility   Outcome Measure  Help needed turning from your back to your side while in a flat bed without using bedrails?: A Little Help needed moving from lying on your back to sitting on the side of a flat bed without using bedrails?: A Little Help needed moving to and from a bed to a chair (including a wheelchair)?: A Little Help needed standing up from a chair using your arms (e.g., wheelchair or bedside chair)?: A Little Help needed to walk in hospital room?: A Lot Help needed climbing 3-5 steps with a railing? : A Lot 6 Click Score: 16    End of Session Equipment Utilized During Treatment: Gait belt Activity Tolerance: Treatment limited secondary to medical complications (Comment);Patient tolerated treatment well;Patient limited by fatigue (limited by nausea/vomiting) Patient left: with call bell/phone within reach;in chair;with family/visitor present (spouse present, heels floated) Nurse Communication: Mobility status;Other (comment) (pt needs new purewick, multiple episodes n/v) PT Visit Diagnosis: Unsteadiness on feet (R26.81);Muscle weakness (generalized) (M62.81);Difficulty in walking, not elsewhere classified (R26.2)      Time: 1610-9604 PT Time Calculation (min) (ACUTE ONLY): 29 min  Charges:  $Therapeutic Exercise: 8-22 mins $Therapeutic Activity: 8-22 mins                     Fong Mccarry P., PTA Acute Rehabilitation Services Pager: [phone] Office: [place] 10/31/2020, 5:12 PM

## 2020-11-01 ENCOUNTER — Inpatient Hospital Stay (HOSPITAL_COMMUNITY): Payer: Medicare PPO

## 2020-11-01 DIAGNOSIS — I469 Cardiac arrest, cause unspecified: Secondary | ICD-10-CM

## 2020-11-01 DIAGNOSIS — J9601 Acute respiratory failure with hypoxia: Secondary | ICD-10-CM | POA: Diagnosis not present

## 2020-11-01 DIAGNOSIS — R1111 Vomiting without nausea: Secondary | ICD-10-CM | POA: Diagnosis not present

## 2020-11-01 DIAGNOSIS — J9690 Respiratory failure, unspecified, unspecified whether with hypoxia or hypercapnia: Secondary | ICD-10-CM

## 2020-11-01 DIAGNOSIS — Z903 Acquired absence of stomach [part of]: Secondary | ICD-10-CM

## 2020-11-01 DIAGNOSIS — R1110 Vomiting, unspecified: Secondary | ICD-10-CM

## 2020-11-01 DIAGNOSIS — Z452 Encounter for adjustment and management of vascular access device: Secondary | ICD-10-CM

## 2020-11-01 DIAGNOSIS — R0602 Shortness of breath: Secondary | ICD-10-CM

## 2020-11-01 LAB — POCT I-STAT 7, (LYTES, BLD GAS, ICA,H+H)
Acid-base deficit: 7 mmol/L — ABNORMAL HIGH (ref 0.0–2.0)
Acid-base deficit: 4 mmol/L — ABNORMAL HIGH (ref 0.0–2.0)
Acid-base deficit: 6 mmol/L — ABNORMAL HIGH (ref 0.0–2.0)
Bicarbonate: 19.5 mmol/L — ABNORMAL LOW (ref 20.0–28.0)
Bicarbonate: 22.4 mmol/L (ref 20.0–28.0)
Bicarbonate: 22.2 mmol/L (ref 20.0–28.0)
Calcium, Ion: 1.16 mmol/L (ref 1.15–1.40)
Calcium, Ion: 1.17 mmol/L (ref 1.15–1.40)
Calcium, Ion: 1.18 mmol/L (ref 1.15–1.40)
HCT: 27 % — ABNORMAL LOW (ref 36.0–46.0)
HCT: 29 % — ABNORMAL LOW (ref 36.0–46.0)
HCT: 29 % — ABNORMAL LOW (ref 36.0–46.0)
Hemoglobin: 9.2 g/dL — ABNORMAL LOW (ref 12.0–15.0)
Hemoglobin: 9.9 g/dL — ABNORMAL LOW (ref 12.0–15.0)
Hemoglobin: 9.9 g/dL — ABNORMAL LOW (ref 12.0–15.0)
O2 Saturation: 97 %
O2 Saturation: 88 %
O2 Saturation: 81 %
Patient temperature: 97.7
Patient temperature: 97.2
Potassium: 3.6 mmol/L (ref 3.5–5.1)
Potassium: 3.9 mmol/L (ref 3.5–5.1)
Potassium: 3.5 mmol/L (ref 3.5–5.1)
Sodium: 133 mmol/L — ABNORMAL LOW (ref 135–145)
Sodium: 133 mmol/L — ABNORMAL LOW (ref 135–145)
Sodium: 133 mmol/L — ABNORMAL LOW (ref 135–145)
TCO2: 21 mmol/L — ABNORMAL LOW (ref 22–32)
TCO2: 24 mmol/L (ref 22–32)
TCO2: 24 mmol/L (ref 22–32)
pCO2 arterial: 39.6 mmHg (ref 32.0–48.0)
pCO2 arterial: 45.3 mmHg (ref 32.0–48.0)
pCO2 arterial: 56.5 mmHg — ABNORMAL HIGH (ref 32.0–48.0)
pH, Arterial: 7.299 — ABNORMAL LOW (ref 7.350–7.450)
pH, Arterial: 7.298 — ABNORMAL LOW (ref 7.350–7.450)
pH, Arterial: 7.202 — ABNORMAL LOW (ref 7.350–7.450)
pO2, Arterial: 94 mmHg (ref 83.0–108.0)
pO2, Arterial: 58 mmHg — ABNORMAL LOW (ref 83.0–108.0)
pO2, Arterial: 56 mmHg — ABNORMAL LOW (ref 83.0–108.0)

## 2020-11-01 LAB — GLUCOSE, CAPILLARY
Glucose-Capillary: 135 mg/dL — ABNORMAL HIGH (ref 70–99)
Glucose-Capillary: 130 mg/dL — ABNORMAL HIGH (ref 70–99)
Glucose-Capillary: 112 mg/dL — ABNORMAL HIGH (ref 70–99)
Glucose-Capillary: 127 mg/dL — ABNORMAL HIGH (ref 70–99)

## 2020-11-01 LAB — BASIC METABOLIC PANEL
Anion gap: 12 (ref 5–15)
BUN: 18 mg/dL (ref 8–23)
CO2: 20 mmol/L — ABNORMAL LOW (ref 22–32)
Calcium: 8.3 mg/dL — ABNORMAL LOW (ref 8.9–10.3)
Chloride: 100 mmol/L (ref 98–111)
Creatinine, Ser: 1.28 mg/dL — ABNORMAL HIGH (ref 0.44–1.00)
GFR, Estimated: 43 mL/min — ABNORMAL LOW (ref >60)
Glucose, Bld: 212 mg/dL — ABNORMAL HIGH (ref 70–99)
Potassium: 3.8 mmol/L (ref 3.5–5.1)
Sodium: 132 mmol/L — ABNORMAL LOW (ref 135–145)

## 2020-11-01 LAB — CBC
HCT: 29.4 % — ABNORMAL LOW (ref 36.0–46.0)
Hemoglobin: 9.3 g/dL — ABNORMAL LOW (ref 12.0–15.0)
MCH: 29.6 pg (ref 26.0–34.0)
MCHC: 31.6 g/dL (ref 30.0–36.0)
MCV: 93.6 fL (ref 80.0–100.0)
Platelets: 257 10*3/uL (ref 150–400)
RBC: 3.14 MIL/uL — ABNORMAL LOW (ref 3.87–5.11)
RDW: 15.2 % (ref 11.5–15.5)
WBC: 33.2 10*3/uL — ABNORMAL HIGH (ref 4.0–10.5)
nRBC: 0.2 % (ref 0.0–0.2)

## 2020-11-01 LAB — LACTIC ACID, PLASMA
Lactic Acid, Venous: 4.8 mmol/L (ref 0.5–1.9)
Lactic Acid, Venous: 3.7 mmol/L (ref 0.5–1.9)

## 2020-11-01 LAB — CREATININE, SERUM
Creatinine, Ser: 1.15 mg/dL — ABNORMAL HIGH (ref 0.44–1.00)
GFR, Estimated: 49 mL/min — ABNORMAL LOW (ref >60)

## 2020-11-01 LAB — MRSA NEXT GEN BY PCR, NASAL: MRSA by PCR Next Gen: NOT DETECTED

## 2020-11-01 MED ORDER — LEVOFLOXACIN IN D5W 500 MG/100ML IV SOLN
500.0000 mg | INTRAVENOUS | Status: DC
  Filled 2020-11-01: qty 100

## 2020-11-01 MED ORDER — FUROSEMIDE 10 MG/ML IJ SOLN
20.0000 mg | Freq: Once | INTRAMUSCULAR | Status: AC
  Administered 2020-11-01: 20 mg via INTRAVENOUS
  Filled 2020-11-01: qty 2

## 2020-11-01 MED ORDER — POLYETHYLENE GLYCOL 3350 17 G PO PACK
17.0000 g | PACK | Freq: Every day | ORAL | Status: DC
  Administered 2020-11-01 – 2020-11-02 (×2): 17 g
  Filled 2020-11-01 (×2): qty 1

## 2020-11-01 MED ORDER — NOREPINEPHRINE 4 MG/250ML-% IV SOLN
0.0000 ug/min | INTRAVENOUS | Status: DC
  Administered 2020-11-01: 16 ug/min via INTRAVENOUS
  Administered 2020-11-01: 20 ug/min via INTRAVENOUS
  Administered 2020-11-01: 26 ug/min via INTRAVENOUS
  Administered 2020-11-01: 24 ug/min via INTRAVENOUS
  Administered 2020-11-02: 17 ug/min via INTRAVENOUS
  Filled 2020-11-01 (×6): qty 250

## 2020-11-01 MED ORDER — DOCUSATE SODIUM 50 MG/5ML PO LIQD
100.0000 mg | Freq: Two times a day (BID) | ORAL | Status: DC
  Administered 2020-11-01 – 2020-11-02 (×4): 100 mg
  Filled 2020-11-01 (×4): qty 10

## 2020-11-01 MED ORDER — NOREPINEPHRINE 4 MG/250ML-% IV SOLN
2.0000 ug/min | INTRAVENOUS | Status: DC
  Administered 2020-11-01: 4 ug/min via INTRAVENOUS
  Filled 2020-11-01: qty 250

## 2020-11-01 MED ORDER — ARTIFICIAL TEARS OPHTHALMIC OINT
1.0000 "application " | TOPICAL_OINTMENT | Freq: Three times a day (TID) | OPHTHALMIC | Status: DC
  Administered 2020-11-01 – 2020-11-03 (×5): 1 via OPHTHALMIC
  Filled 2020-11-01 (×2): qty 3.5

## 2020-11-01 MED ORDER — SODIUM CHLORIDE 0.9 % IV SOLN
250.0000 mL | INTRAVENOUS | Status: DC
  Administered 2020-11-01: 250 mL via INTRAVENOUS
  Administered 2020-11-10: 1000 mL via INTRAVENOUS
  Administered 2020-11-10 – 2020-11-14 (×2): 250 mL via INTRAVENOUS

## 2020-11-01 MED ORDER — ROCURONIUM BROMIDE 50 MG/5ML IV SOLN
100.0000 mg | Freq: Once | INTRAVENOUS | Status: AC
  Filled 2020-11-01: qty 10

## 2020-11-01 MED ORDER — LEVOTHYROXINE SODIUM 137 MCG PO TABS
137.0000 ug | ORAL_TABLET | Freq: Every day | ORAL | Status: DC
  Administered 2020-11-02 – 2020-11-06 (×5): 137 ug
  Filled 2020-11-01 (×5): qty 1

## 2020-11-01 MED ORDER — MIDAZOLAM BOLUS VIA INFUSION
1.0000 mg | INTRAVENOUS | Status: DC | PRN
  Administered 2020-11-01 – 2020-11-02 (×2): 2 mg via INTRAVENOUS
  Filled 2020-11-01: qty 2

## 2020-11-01 MED ORDER — IOHEXOL 9 MG/ML PO SOLN
ORAL | Status: AC
  Filled 2020-11-01: qty 1000

## 2020-11-01 MED ORDER — MIDAZOLAM 50MG/50ML (1MG/ML) PREMIX INFUSION
2.0000 mg/h | INTRAVENOUS | Status: DC
  Administered 2020-11-01: 4 mg/h via INTRAVENOUS
  Administered 2020-11-01: 2 mg/h via INTRAVENOUS
  Administered 2020-11-02 – 2020-11-03 (×4): 6 mg/h via INTRAVENOUS
  Filled 2020-11-01 (×6): qty 50

## 2020-11-01 MED ORDER — FENTANYL 2500MCG IN NS 250ML (10MCG/ML) PREMIX INFUSION
0.0000 ug/h | INTRAVENOUS | Status: DC
  Administered 2020-11-01: 25 ug/h via INTRAVENOUS
  Administered 2020-11-02 – 2020-11-03 (×2): 150 ug/h via INTRAVENOUS
  Filled 2020-11-01 (×3): qty 250

## 2020-11-01 MED ORDER — LEVOFLOXACIN IN D5W 750 MG/150ML IV SOLN
750.0000 mg | INTRAVENOUS | Status: DC
  Administered 2020-11-01: 750 mg via INTRAVENOUS
  Filled 2020-11-01: qty 150

## 2020-11-01 MED ORDER — PANTOPRAZOLE SODIUM 40 MG IV SOLR
40.0000 mg | Freq: Every day | INTRAVENOUS | Status: DC
  Administered 2020-11-01 – 2020-11-21 (×21): 40 mg via INTRAVENOUS
  Filled 2020-11-01 (×21): qty 40

## 2020-11-01 MED ORDER — CHLORHEXIDINE GLUCONATE 0.12% ORAL RINSE (MEDLINE KIT)
15.0000 mL | Freq: Two times a day (BID) | OROMUCOSAL | Status: DC
  Administered 2020-11-01 – 2020-11-11 (×22): 15 mL via OROMUCOSAL

## 2020-11-01 MED ORDER — FENTANYL CITRATE (PF) 100 MCG/2ML IJ SOLN: 25.0000 ug | INTRAMUSCULAR | Status: DC | PRN

## 2020-11-01 MED ORDER — ORAL CARE MOUTH RINSE
15.0000 mL | OROMUCOSAL | Status: DC
  Administered 2020-11-01 – 2020-11-09 (×74): 15 mL via OROMUCOSAL

## 2020-11-01 MED ORDER — SODIUM CHLORIDE 0.9 % IV SOLN
2.0000 g | INTRAVENOUS | Status: DC
  Filled 2020-11-01: qty 20

## 2020-11-01 MED ORDER — LACTATED RINGERS IV SOLN: INTRAVENOUS | Status: DC

## 2020-11-01 MED ORDER — ROCURONIUM BROMIDE 10 MG/ML (PF) SYRINGE
PREFILLED_SYRINGE | INTRAVENOUS | Status: AC
  Administered 2020-11-01: 100 mg via INTRAVENOUS
  Filled 2020-11-01: qty 10

## 2020-11-01 MED ORDER — FENTANYL CITRATE (PF) 100 MCG/2ML IJ SOLN
25.0000 ug | INTRAMUSCULAR | Status: DC | PRN
Start: 2020-11-01 — End: 2020-11-03
  Administered 2020-11-01: 25 ug via INTRAVENOUS
  Filled 2020-11-01: qty 2

## 2020-11-01 MED ORDER — PROSOURCE TF PO LIQD
45.0000 mL | Freq: Two times a day (BID) | ORAL | Status: DC
  Administered 2020-11-01 – 2020-11-06 (×10): 45 mL
  Filled 2020-11-01 (×11): qty 45

## 2020-11-01 MED ORDER — SODIUM CHLORIDE 0.9 % IV SOLN
100.0000 mg | Freq: Two times a day (BID) | INTRAVENOUS | Status: DC
  Filled 2020-11-01 (×2): qty 100

## 2020-11-01 MED ORDER — DIATRIZOATE MEGLUMINE & SODIUM 66-10 % PO SOLN: 50.0000 mL | Freq: Once | ORAL | Status: AC

## 2020-11-01 MED ORDER — DIATRIZOATE MEGLUMINE & SODIUM 66-10 % PO SOLN
ORAL | Status: AC
  Administered 2020-11-01: 10 mL
  Filled 2020-11-01: qty 30

## 2020-11-01 MED ORDER — CHLORHEXIDINE GLUCONATE CLOTH 2 % EX PADS
6.0000 | MEDICATED_PAD | Freq: Every day | CUTANEOUS | Status: DC
  Administered 2020-11-01 – 2020-11-21 (×22): 6 via TOPICAL

## 2020-11-01 MED ORDER — DEXMEDETOMIDINE HCL IN NACL 400 MCG/100ML IV SOLN
0.4000 ug/kg/h | INTRAVENOUS | Status: DC
  Administered 2020-11-01: 0.4 ug/kg/h via INTRAVENOUS
  Filled 2020-11-01: qty 100

## 2020-11-01 MED ORDER — VITAL 1.5 CAL PO LIQD
1000.0000 mL | ORAL | Status: DC
  Administered 2020-11-01: 1000 mL

## 2020-11-01 MED ORDER — CISATRACURIUM BESYLATE 20 MG/10ML IV SOLN
0.1000 mg/kg | INTRAVENOUS | Status: DC | PRN
  Filled 2020-11-01: qty 10

## 2020-11-01 NOTE — Progress Notes (Signed)
0519 This RN entered pt room to find pt vomiting brown thin liquids into blue emesis bag. Pt responsive to voice, incomprehensible speech due to gurgling of liquids. Pt begin to look pale and unresponsive. Unable to palpate strong pulse. Code called. Compressions started. Code team paged. MD paged regarding pts condition. Spouse called and made aware. Pt transferred to 2H02. Report given to Adonis Huguenin, Therapist, sports. Spouse updated on pts transfer and room number.

## 2020-11-01 NOTE — Procedures (Signed)
Central Venous Catheter Insertion Procedure Note  [name]  [id number]  13-Jun-1945  Date:11/01/20  Time:9:58 AM   Provider Performing:Esteban Kobashigawa D Rollene Rotunda   Procedure: Insertion of Non-tunneled Central Venous [phone]) with US guidance (18403)   Indication(s) Medication administration  Consent Risks of the procedure as well as the alternatives and risks of each were explained to the patient and/or caregiver.  Consent for the procedure was obtained and is signed in the bedside chart  Anesthesia Topical only with 1% lidocaine   Timeout Verified patient identification, verified procedure, site/side was marked, verified correct patient position, special equipment/implants available, medications/allergies/relevant history reviewed, required imaging and test results available.  Sterile Technique Maximal sterile technique including full sterile barrier drape, hand hygiene, sterile gown, sterile gloves, mask, hair covering, sterile ultrasound probe cover (if used).  Procedure Description Area of catheter insertion was cleaned with chlorhexidine and draped in sterile fashion.  With real-time ultrasound guidance a central venous catheter was placed into the left internal jugular vein. Nonpulsatile blood flow and easy flushing noted in all ports.  The catheter was sutured in place and sterile dressing applied.  Complications/Tolerance None; patient tolerated the procedure well. Chest X-ray is ordered to verify placement for internal jugular or subclavian cannulation.   Chest x-ray is not ordered for femoral cannulation.  EBL Minimal  Specimen(s) None  JD Rexene Agent [facility] 11/01/2020, 10:00 AM  Please see [email or web address] for pager details.  From 7A-7P if no response, please call [phone]. After hours, please call ELink [phone].

## 2020-11-01 NOTE — Progress Notes (Signed)
eLink Physician-Brief Progress Note Patient Name: [name] DOB: [birth year] MRN: [id number]   Date of Service  11/01/2020  HPI/Events of Note  69 F history of hypertension, DM, dyslipidemia, HFpEF, CAD, OSA, s/p robotic assisted distal gastrectomy (6/28) for intramucosal adenocarcinoma and high grade dysplasia. Nausea and vomiting post op. She had episode of copious amount of vomiting this morning then went into cardiac arrest. ROSC after 6 minutes. Airway secured.  eICU Interventions  Cardiac arrest, aspiration event. 1 liter out of NGT since inserted Will need to assess mental status to see if candidate for TTM ET tube to be adjusted as 1 cm above the carina. Discussed with bedside RN     Intervention Category Evaluation Type: New Patient Evaluation  Judd Lien 11/01/2020, 6:35 AM

## 2020-11-01 NOTE — Progress Notes (Signed)
PHARMACY NOTE:  ANTIMICROBIAL RENAL DOSAGE ADJUSTMENT  Current antimicrobial regimen includes a mismatch between antimicrobial dosage and estimated renal function.  As per policy approved by the Pharmacy & Therapeutics and Medical Executive Committees, the antimicrobial dosage will be adjusted accordingly.  Current antimicrobial dosage:  levaquin 500mg  IV q24hr  Indication: aspiration PNA  Renal Function:  Estimated Creatinine Clearance: 38.8 mL/min (A) (by C-G formula based on SCr of 1.28 mg/dL (H)).     Antimicrobial dosage has been changed to:  levaquin 750mg  IV q48hr  Additional comments:   Thank you for allowing pharmacy to be a part of this patient's care.  Hildred Laser, PharmD Clinical Pharmacist **Pharmacist phone directory can now be found on [email or web address] (PW [email or web address]).  Listed under [place].

## 2020-11-01 NOTE — Progress Notes (Addendum)
Initial Nutrition Assessment  DOCUMENTATION CODES:   Not applicable  INTERVENTION:   Trickle tube feeding:  -Vital 1.5 @ 20 ml/hr via post pyloric Cortrak -Advance per surgery to goal rate of 50 ml/hr (1200 ml) -ProSource 45 ml BID  At goal rate TF provides: 1880 kcals, 103 grams protein, 917 ml free water.   NUTRITION DIAGNOSIS:   Increased nutrient needs related to post-op healing as evidenced by estimated needs.  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 Presents this admission for elective robotic distal gastrectomy.  6/28- s/p robotic distal gastrectomy 7/05- PEA arrest, intubated   Requiring levophed. Noted vomiting episode yesterday. OG remains to LIS. Cortrak placed at bedside. Awaiting KUB with contrast confirmation of post pyloric placement. Okay to start trickles if confirmed per CCM. Advance per surgery to goal rate.   Noted day 7 without adequate nutrition. Patient has been NPO or on clear liquids since admit with meal completions documented as 0-25%. Patient nutritionally at risk.   Patient is currently intubated on ventilator support MV: 10.3 L/min Temp (24hrs), Avg:97.8 F (36.6 C), Min:97.7 F (36.5 C), Max:97.9 F (36.6 C)   UOP: 200 ml since ICU transfer OG: 1000 ml x 24 hrs   Drips: LR @ 75 ml/hr, levophed  Medications: colace, SS novolog, miralax Labs: Na 133 (L) CBG 130-212  NUTRITION - FOCUSED PHYSICAL EXAM:  Flowsheet Row Most Recent Value  Orbital Region No depletion  Upper Arm Region No depletion  Thoracic and Lumbar Region Unable to assess  Buccal Region No depletion  Temple Region No depletion  Clavicle Bone  Region No depletion  Clavicle and Acromion Bone Region No depletion  Scapular Bone Region Unable to assess  Dorsal Hand No depletion  Patellar Region No depletion  Anterior Thigh Region No depletion  Posterior Calf Region No depletion  Edema (RD Assessment) Mild  Hair Reviewed  Eyes Unable to assess  Mouth Unable to assess  Skin Reviewed  Nails Reviewed      Diet Order:   Diet Order             Diet NPO time specified  Diet effective now                   EDUCATION NEEDS:   Not appropriate for education at this time  Skin:  Skin Assessment: Skin Integrity Issues: Skin Integrity Issues:: Incisions Incisions: abdomen  Last BM:  7/4  Height:   Ht Readings from Last 1 Encounters:  10/07/2020 4\' 11"  (1.499 m)    Weight:   Wt Readings from Last 1 Encounters:  11/01/20 102.1 kg    BMI:  Body mass index is 45.46 kg/m.  Estimated Nutritional Needs:   Kcal:  1700-1900 kcal  Protein:  95-115 grams  Fluid:  >/= 1.7 L/day   Mariana Single MS, RD, LDN, CNSC Clinical Nutrition Pager listed in [place]

## 2020-11-01 NOTE — Procedures (Signed)
Bronchoscopy Procedure Note  SHAYLIE EKLUND  [id number]  03-Dec-1945  Date:11/01/20  Time:12:21 PM   Provider Performing:Brent Hazaiah [place]   Procedure(s):  Flexible Bronchoscopy [phone])  Indication(s) Worsening respiratory failure, concern for aspiration of food/vomitus  Consent Risks of the procedure as well as the alternatives and risks of each were explained to the patient and/or caregiver.  Consent for the procedure was obtained and is signed in the bedside chart  Anesthesia Moderate sedation per PAD protocol   Time Out Verified patient identification, verified procedure, site/side was marked, verified correct patient position, special equipment/implants available, medications/allergies/relevant history reviewed, required imaging and test results available.   Sterile Technique Usual hand hygiene, masks, gowns, and gloves were used   Procedure Description Bronchoscope advanced through endotracheal tube and into airway.  Airways were examined down to subsegmental level with findings noted below.   Following diagnostic evaluation, no procedures were performed.  Findings: normal trachea and carina, some minor airway trauma from suctioning in BI and RML, some thick secretions cleared from the left mainstem and the bronchus intermedius but these were scant and non-obstructing. The rest of the exam was normal without airway lesion, mass.   Complications/Tolerance None; patient tolerated the procedure well. Chest X-ray is not needed post procedure.   EBL None   Specimen(s) None  Roselie Awkward, MD [place] [facility] Pager: [phone] Cell: [phone] If no response, please call [phone] until 7pm After 7:00 pm call Elink  [phone]

## 2020-11-01 NOTE — Procedures (Signed)
Arterial Catheter Insertion Procedure Note  [name]  [id number]  03-16-1946  Date:11/01/20  Time:9:57 AM    Provider Performing: Mick Sell    Procedure: Insertion of Arterial Line [phone]) with US guidance ([id number])   Indication(s) Blood pressure monitoring and/or need for frequent ABGs  Consent Unable to obtain consent due to emergent nature of procedure.  Anesthesia None   Time Out Verified patient identification, verified procedure, site/side was marked, verified correct patient position, special equipment/implants available, medications/allergies/relevant history reviewed, required imaging and test results available.   Sterile Technique Maximal sterile technique including full sterile barrier drape, hand hygiene, sterile gown, sterile gloves, mask, hair covering, sterile ultrasound probe cover (if used).   Procedure Description Area of catheter insertion was cleaned with chlorhexidine and draped in sterile fashion. With real-time ultrasound guidance an arterial catheter was placed into the right radial artery.  Appropriate arterial tracings confirmed on monitor.     Complications/Tolerance None; patient tolerated the procedure well.   EBL Minimal   Specimen(s) None  [name] 11/01/2020, 9:58 AM  Please see [email or web address] for pager details.  From 7A-7P if no response, please call [phone]. After hours, please call ELink [phone].

## 2020-11-01 NOTE — Consult Note (Signed)
NAME:  SANDRIKA SCHWINN, MRN:  [id number], DOB:  1946-01-17, LOS: 7 ADMISSION DATE:  10/03/2020, CONSULTATION DATE:  11/01/20 REFERRING MD:  Barry Dienes CHIEF COMPLAINT:  Vent Management   History of Present Illness:  [name] is a 75 y.o. 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 She had procedure done 6/28.  Pathology was negative for malignancy but showed LGD with negative nodes.  Early AM 7/5, she had wheezing so received a breathing treatment.  Shortly thereafter, she was found with emesis all over bed and floor.  She had gurgling sounds and altered LOC.  She ended up having loss of pulses with initial rhythm asystole.  She received 1mg  epinephrine before PEA for which she received a 2nd dose of epinephrine.  She then had ROSC after 6 minutes.  She was intubated during the code.  She was transferred to [facility] ICU where PCCM was asked to assist with vent management.  Pertinent  Medical History:  has HYPOTHYROIDISM; Diabetes ([place]); OBESITY; OBSTRUCTIVE SLEEP APNEA; HYPERTENSION, BENIGN; CORONARY ARTERY DISEASE; BRADYCARDIA; ALLERGIC RHINITIS; DEGENERATIVE JOINT DISEASE, GENERALIZED; BACK PAIN, CHRONIC; CHEST PAIN; Focal seizures ([place]); TIA (transient ischemic attack); Type 2 diabetes mellitus without complication ([place]); Hyperlipidemia; and S/P partial gastrectomy on their problem list.  Significant Hospital Events: Including procedures, antibiotic start and stop dates in addition to other pertinent events   6/28 > admit. 7/5 > 6 minute code (asystole then PEA).  Interim History / Subjective:  Moves extremities but does not follow commands.  Objective:  Blood pressure (!) 151/128, pulse 87, temperature 97.7 F (36.5 C), temperature source Oral, resp. rate (!) 30, height 4\' 11"  (1.499 m), weight 94.8 kg, SpO2 97 %.    Vent Mode: PRVC FiO2 (%):   [80 %] 80 % Set Rate:  [25 bmp] 25 bmp Vt Set:  [360 mL] 360 mL PEEP:  [5 cmH20] 5 cmH20 Plateau Pressure:  [18 cmH20] 18 cmH20   Intake/Output Summary (Last 24 hours) at 11/01/2020 4010 Last data filed at 11/01/2020 0600 Gross per 24 hour  Intake 2954.01 ml  Output 201 ml  Net 2753.01 ml   Filed Weights   10/02/2020 0616  Weight: 94.8 kg    Examination: General: Adult female, resting in bed, in NAD. Neuro: Altered, does not follow commands but does MAE's. HEENT: [place]/AT. Sclerae anicteric. ETT in place. Cardiovascular: RRR, no M/R/G.  Lungs: Respirations even and unlabored.  CTA bilaterally, No W/R/R.  Abdomen: Surgical incisions C/D/I. BS hypoactive.  Abd soft, NT/ND.  Musculoskeletal: No gross deformities, no edema.  Skin: Intact, warm, no rashes.   Resolved Hospital Problem list:    Assessment & Plan:   Brief Cardiac Arrest - 6 minutes downtime prior to ROSC. - Supportive care. - Assess mental status.  Presumed aspiration PNA. - Start Levaquin (PCN allergy). - Send trach aspirate.  Acute hypoxic respiratory failure - 2/2 above. - Full vent support. - Wean as able. - Bronchial hygiene. - Follow ABG and CXR.  Hypotension - presumed 2/2 above. - Start Levophed via PIV. - Gentle hydration. - Will have day team follow up regarding need for CVL.  Hx gastric polyp with HGD - s/p robotic assisted distal gastrectomy 6/28 with pathology showing low grade dysplasia with no malignancy and negative nodes. - Post op care per surgery. - F/u on CT abdomen.  Possible ileus. - Continue OGT to LIS. - F/u on  CT abdomen.  Hx Seizures. - Continue Keppra.  Hx DM. - SSI.  Hx Anxiety. - Hold Alprazolam, Bupropion, Doxepin, Fluoxetine, Gabapentin, Mirtazapine, Ropinirole.   Best practice (evaluated daily):  Diet/type: NPO DVT prophylaxis: LMWH GI prophylaxis: PPI Lines: N/A Foley:  N/A Code Status:  full code Last date of multidisciplinary goals of care discussion:  None.  Labs   CBC: Recent Labs  Lab 10/28/20 0447 10/28/20 1520 10/29/20 0316 10/30/20 0048 10/31/20 0042 11/01/20 0600  WBC 19.2* 27.9* 18.1* 21.2* 19.2*  --   HGB 10.5* 9.3* 8.8* 9.6* 9.1* 9.2*  HCT 34.6* 29.6* 27.8* 30.2* 29.2* 27.0*  MCV 96.6 94.6 94.6 92.4 93.3  --   PLT 223 201 PLATELET CLUMPS NOTED ON SMEAR, UNABLE TO ESTIMATE 233 246  --     Basic Metabolic Panel: Recent Labs  Lab 10/26/20 0323 10/27/20 0208 10/28/20 0151 10/28/20 1520 10/29/20 0316 10/30/20 0048 10/31/20 0042 11/01/20 0212 11/01/20 0600  NA 134*   < > 135 133* 135 134* 132*  --  133*  K 5.0   < > 5.3* 4.7 4.9 4.6 4.6  --  3.6  CL 104   < > 105 102 105 103 105  --   --   CO2 21*   < > 22 19* 21* 23 20*  --   --   GLUCOSE 149*   < > 184* 229* 126* 174* 146*  --   --   BUN 15   < > 21 22 23 23 19   --   --   CREATININE 1.45*   < > 1.73* 1.76* 1.30* 1.17* 1.05* 1.15*  --   CALCIUM 8.2*   < > 8.3* 8.4* 8.7* 8.5* 8.5*  --   --   MG 1.7  --   --   --   --   --   --   --   --   PHOS 3.5  --   --   --   --   --   --   --   --    < > = values in this interval not displayed.   GFR: Estimated Creatinine Clearance: 41.3 mL/min (A) (by C-G formula based on SCr of 1.15 mg/dL (H)). Recent Labs  Lab 10/28/20 1520 10/29/20 0316 10/30/20 0048 10/31/20 0042  WBC 27.9* 18.1* 21.2* 19.2*    Liver Function Tests: No results for input(s): AST, ALT, ALKPHOS, BILITOT, PROT, ALBUMIN in the last 168 hours. No results for input(s): LIPASE, AMYLASE in the last 168 hours. No results for input(s): AMMONIA in the last 168 hours.  ABG    Component Value Date/Time   PHART 7.299 (L) 11/01/2020 0600   PCO2ART 39.6 11/01/2020 0600   PO2ART 94 11/01/2020 0600   HCO3 19.5 (L) 11/01/2020 0600   TCO2 21 (L) 11/01/2020 0600   ACIDBASEDEF 7.0 (H) 11/01/2020 0600   O2SAT 97.0 11/01/2020 0600     Coagulation Profile: No results for input(s): INR, PROTIME in the last 168 hours.  Cardiac Enzymes: No results for  input(s): CKTOTAL, CKMB, CKMBINDEX, TROPONINI in the last 168 hours.  HbA1C: Hgb A1c MFr Bld  Date/Time Value Ref Range Status  10/19/2020 11:50 AM 6.1 (H) 4.8 - 5.6 % Final    Comment:    (NOTE) Pre diabetes:          5.7%-6.4%  Diabetes:              >6.4%  Glycemic control for   <7.0% adults with  diabetes   10/21/2013 03:57 PM 6.4 (H) <5.7 % Final    Comment:    (NOTE)                                                                       According to the ADA Clinical Practice Recommendations for 2011, when HbA1c is used as a screening test:  >=6.5%   Diagnostic of Diabetes Mellitus           (if abnormal result is confirmed) 5.7-6.4%   Increased risk of developing Diabetes Mellitus References:Diagnosis and Classification of Diabetes Mellitus,Diabetes MGQQ,7619,50(DTOIZ 1):S62-S69 and Standards of Medical Care in         Diabetes - 2011,Diabetes TIWP,8099,83 (Suppl 1):S11-S61.    CBG: Recent Labs  Lab 10/30/20 1637 10/30/20 2101 10/31/20 0752 10/31/20 1145 10/31/20 1644  GLUCAP 174* 140* 150* 143* 137*    Review of Systems:   Unable to obtain as pt is encephalopathic.  Past Medical History:  She,  has a past medical history of Anxiety, CAD (coronary artery disease), Cancer ([place]), CHF (congestive heart failure) ([place]), Chronic kidney disease, Depression, DJD (degenerative joint disease), DM (diabetes mellitus) ([place]), GERD (gastroesophageal reflux disease), H/O hiatal hernia, HTN (hypertension), Hypothyroidism, OSA (obstructive sleep apnea), and Stroke ([place]).   Surgical History:   Past Surgical History:  Procedure Laterality Date   ABDOMINAL HYSTERECTOMY     BIOPSY  06/17/2020   Procedure: BIOPSY;  Surgeon: Carol Ada, MD;  Location: [facility];  Service: Endoscopy;;   BIOPSY  07/01/2020   Procedure: BIOPSY;  Surgeon: Carol Ada, MD;  Location: [facility];  Service: Endoscopy;;   CARPAL TUNNEL RELEASE     CERVICAL [place] SURGERY     ESOPHAGEAL DILATION   06/17/2020   Procedure: ESOPHAGEAL DILATION;  Surgeon: Carol Ada, MD;  Location: [facility];  Service: Endoscopy;;   ESOPHAGOGASTRODUODENOSCOPY (EGD) WITH PROPOFOL N/A 06/17/2020   Procedure: ESOPHAGOGASTRODUODENOSCOPY (EGD) WITH PROPOFOL;  Surgeon: Carol Ada, MD;  Location: [facility];  Service: Endoscopy;  Laterality: N/A;   ESOPHAGOGASTRODUODENOSCOPY (EGD) WITH PROPOFOL N/A 07/01/2020   Procedure: ESOPHAGOGASTRODUODENOSCOPY (EGD) WITH PROPOFOL;  Surgeon: Carol Ada, MD;  Location: [facility];  Service: Endoscopy;  Laterality: N/A;   HEMOSTASIS CLIP PLACEMENT  07/01/2020   Procedure: HEMOSTASIS CLIP PLACEMENT;  Surgeon: Carol Ada, MD;  Location: [facility];  Service: Endoscopy;;   JOINT REPLACEMENT     POLYPECTOMY  07/01/2020   Procedure: POLYPECTOMY;  Surgeon: Carol Ada, MD;  Location: [facility];  Service: Endoscopy;;   REPLACEMENT TOTAL KNEE Bilateral    SALPINGOOPHORECTOMY     SUBMUCOSAL INJECTION  07/01/2020   Procedure: SUBMUCOSAL INJECTION;  Surgeon: Carol Ada, MD;  Location: [facility];  Service: Endoscopy;;   TONSILLECTOMY     UVULOPALATOPHARYNGOPLASTY       Social History:   reports that she has never smoked. She has never used smokeless tobacco. She reports that she does not drink alcohol and does not use drugs.   Family History:  Her family history includes Breast cancer in her mother and sister.   Allergies Allergies  Allergen Reactions   Codeine Itching   Meperidine Hcl Itching   Morphine Itching   Nylon Other (See Comments)    Stitches cause infection   Penicillins  Hives, Itching and Rash    Has patient had a PCN reaction causing immediate rash, facial/tongue/throat swelling, SOB or lightheadedness with hypotension: Yes Has patient had a PCN reaction causing severe rash involving mucus membranes or skin necrosis: Yes Has patient had a PCN reaction that required hospitalization: No Has patient had a PCN reaction occurring within  the last 10 years: No If all of the above answers are "NO", then may proceed with Cephalosporin use.      Home Medications  Prior to Admission medications   Medication Sig Start Date End Date Taking? Authorizing Provider  ALPRAZolam Duanne Moron) 0.5 MG tablet Take 0.25-0.5 mg by mouth 3 (three) times daily as needed for anxiety.   Yes [provider]  amLODipine (NORVASC) 5 MG tablet Take 5 mg by mouth daily. 05/24/20  Yes [provider]  aspirin 81 MG tablet Take 81 mg by mouth daily.   Yes [provider]  atorvastatin (LIPITOR) 20 MG tablet Take 20 mg by mouth daily at 6 PM.  09/15/13  Yes [provider]  buPROPion (WELLBUTRIN XL) 150 MG 24 hr tablet Take 150 mg by mouth daily.  12/01/13  Yes [provider]  Cholecalciferol (VITAMIN D) 50 MCG (2000 UT) tablet Take 2,000 Units by mouth daily.   Yes [provider]  doxepin (SINEQUAN) 25 MG capsule Take 25 mg by mouth at bedtime. 05/24/20  Yes [provider]  FLUoxetine (PROZAC) 40 MG capsule Take 40 mg by mouth every morning. 05/24/20  Yes [provider]  fluticasone (FLONASE) 50 MCG/ACT nasal spray Place 2 sprays into both nostrils daily as needed for allergies.    Yes [provider]  furosemide (LASIX) 40 MG tablet Take 40 mg by mouth daily.   Yes [provider]  gabapentin (NEURONTIN) 300 MG capsule Take 600 mg by mouth daily. 02/14/14  Yes [provider]  glipiZIDE (GLUCOTROL) 5 MG tablet Take 5 mg by mouth 2 (two) times daily. 05/24/20  Yes [provider]  levETIRAcetam (KEPPRA) 500 MG tablet Take 1 tablet (500 mg total) by mouth 2 (two) times daily. Patient taking differently: Take 500 mg by mouth every 12 (twelve) hours. 05/20/17  Yes Rosalin Hawking, MD  levothyroxine (SYNTHROID, LEVOTHROID) 137 MCG tablet Take 137 mcg by mouth daily before breakfast.   Yes [provider]  metoprolol (LOPRESSOR) 50 MG tablet Take 100 mg by  mouth at bedtime. 01/05/14  Yes [provider]  mirtazapine (REMERON) 45 MG tablet Take 45 mg by mouth at bedtime. 05/24/20  Yes [provider]  pantoprazole (PROTONIX) 40 MG tablet Take 40 mg by mouth every morning. 05/24/20  Yes [provider]  potassium chloride SA (KLOR-CON) 20 MEQ tablet Take 20 mEq by mouth every morning. 09/28/20  Yes [provider]  rOPINIRole (REQUIP) 0.25 MG tablet Take 0.25 mg by mouth at bedtime.   Yes [provider]  RYBELSUS 7 MG TABS Take 7 mg by mouth daily. 05/24/20  Yes [provider]  traMADol (ULTRAM) 50 MG tablet Take 100 mg by mouth at bedtime. 10/03/20  Yes [provider]  traZODone (DESYREL) 50 MG tablet Take 50-100 mg by mouth at bedtime. 10/01/13  Yes [provider]  valsartan (DIOVAN) 320 MG tablet Take 320 mg by mouth every morning. 05/24/20  Yes [provider]     Critical care time: 40 min.   Montey Hora, [place] [facility] For pager details,  please see AMION or use Epic chat  After 1900, please call [place] for cross coverage needs 11/01/2020, 6:21 AM

## 2020-11-01 NOTE — Progress Notes (Signed)
   Progress Note  7 Days Post-Op  Subjective: Pt continues to have issues keeping things down.  She also complains of wheezing.    Objective: Vital signs in last 24 hours: Temp:  [97.5 F (36.4 C)-97.9 F (36.6 C)] 97.9 F (36.6 C) (07/04 2106) Pulse Rate:  [77-90] 77 (07/04 2106) Resp:  [16-20] 16 (07/04 2106) BP: (114-138)/(48-53) 114/53 (07/04 2106) SpO2:  [95 %-98 %] 98 % (07/04 2106) Last BM Date: 10/29/20  Intake/Output from previous day: 07/04 0701 - 07/05 0700 In: 100 [P.O.:100] Out: 201 [Urine:200; Emesis/NG output:1] Intake/Output this shift: Total I/O In: -  Out: 1 [Emesis/NG output:1]  PE: General: pleasant, WD, obese female who is laying in bed in NAD Heart: regular, rate, and rhythm.   Lungs: end expiratory wheezes throughout. Respiratory effort nonlabored Abd: soft, NT, ND, +BS, incisions C/D/I   Lab Results:  Recent Labs    10/30/20 0048 10/31/20 0042  WBC 21.2* 19.2*  HGB 9.6* 9.1*  HCT 30.2* 29.2*  PLT 233 246   BMET Recent Labs    10/30/20 0048 10/31/20 0042  NA 134* 132*  K 4.6 4.6  CL 103 105  CO2 23 20*  GLUCOSE 174* 146*  BUN 23 19  CREATININE 1.17* 1.05*  CALCIUM 8.5* 8.5*   PT/INR No results for input(s): LABPROT, INR in the last 72 hours. CMP     Component Value Date/Time   NA 132 (L) 10/31/2020 0042   K 4.6 10/31/2020 0042   CL 105 10/31/2020 0042   CO2 20 (L) 10/31/2020 0042   GLUCOSE 146 (H) 10/31/2020 0042   BUN 19 10/31/2020 0042   CREATININE 1.05 (H) 10/31/2020 0042   CALCIUM 8.5 (L) 10/31/2020 0042   PROT 7.2 10/19/2020 1150   ALBUMIN 3.8 10/19/2020 1150   AST 23 10/19/2020 1150   ALT 16 10/19/2020 1150   ALKPHOS 73 10/19/2020 1150   BILITOT 0.2 (L) 10/19/2020 1150   GFRNONAA 55 (L) 10/31/2020 0042   GFRAA 58 (L) 02/08/2017 1025   Lipase     Component Value Date/Time   LIPASE 29 07/17/2007 2035       Studies/Results: No results found.  Anti-infectives: Anti-infectives (From admission,  onward)    Start     Dose/Rate Route Frequency Ordered Stop   10/14/2020 2100  ciprofloxacin (CIPRO) IVPB 400 mg        400 mg 200 mL/hr over 60 Minutes Intravenous Every 12 hours 10/03/2020 1258 10/27/2020 2148   10/07/2020 0600  ciprofloxacin (CIPRO) IVPB 400 mg        400 mg 200 mL/hr over 60 Minutes Intravenous On call to O.R. 10/14/2020 0547 09/28/2020 0847        Assessment/Plan s/p Procedure(s): XI ROBOTIC ASSISTED DISTAL GASTRECTOMY (N/A)   Pathology from surgery 6/28, low grade dysplasia.  No cancer and 13 negative nodes.     Will get Ct given patient's continued issues with n/v and leukocytosis.   Getting albuterol.  Will also give a dose of IV lasix.     LOS: 7 days    Stark Klein, [facility] 11/01/2020, 3:08 AM Please see Amion for pager number during day hours 7:00am-4:30pm

## 2020-11-01 NOTE — Procedures (Signed)
Intubation Procedure Note  DARINDA STUTEVILLE  [id number]  1945/12/28  Date:11/01/20  Time:6:06 AM   Provider Performing: Leigh Aurora , BS, RRT-ACCS, RCP   Procedure: Intubation (58832)  Indication(s) Respiratory Failure, CODE BLUE/Cardiac Arrest  Consent Unable to obtain consent due to emergent nature of procedure.   Anesthesia: None patient is Unresponsive   Sterile Technique Usual hand hygeine, masks, and gloves were used   Procedure Description Patient in recliner on arrival.  Patient was intubated with endotracheal tube using Mac 4.  View was Grade 3 only epiglottis .  Number of attempts was 1.  Colorimetric CO2 detector was consistent with tracheal placement.   On arrival to patient room, CODE BLUE was initiated and ACLS protocol followed:  On arrival to patient room, noted patient was in the recliner chair slumped over in cardiac arrest with massive amounts of dark brown thin liquid secretions/contents all over the floor, chest, and patient oropharynx. Mottled coloration noted. Blow-by oxygen via BMV was given to patient, unable to mask bag ventilate the patient due to copious/massive amounts of liquid contents pooling in patient mouth. Airway difficulty was anticipated due to body habitus and patient inability to protect airway via copious secretions in patient oropharynx. Patient was intubated under Direct Laryngoscopy in the recliner using a MAC 4 blade, Grade 3 view noted only the glottis and base of the uvula visualized. 7.5 ETT used secured 24 lip. With assistance from Christy Sartorius, RRT holding the yankuer suction in patient mouth while attempting to intubate, self was able to visualize airway structures and ETT pass through the cords. Positive color change noted. BBS to auscultation were noted per MD, Rylee. ROSC was achieved and patient transported to ICU 2H2. Patient tolerated procedure well.   Ha Shannahan L. Coraleigh Sheeran, BS, RRT-ACCS, RCP   Complications/Tolerance None;  patient tolerated the procedure well. Chest X-ray is ordered to verify placement.

## 2020-11-01 NOTE — Procedures (Addendum)
Cortrak  Person Inserting Tube:  Alayzha An, Creola Corn, RD Tube Type:  Cortrak - 43 inches Tube Size:  10 Tube Location:  Right nare Initial Placement:  Postpyloric Secured by: Bridle Technique Used to Measure Tube Placement:  Marking at nare/corner of mouth Cortrak Secured At:  100 cm Cortrak Tube Team Note:  Consult received to place a Cortrak feeding tube.   X-ray is required, abdominal x-ray has been ordered by the Cortrak team. Please confirm tube placement before using the Cortrak tube.   If the tube becomes dislodged please keep the tube and contact the Cortrak team at [email or web address] (password [email or web address]) for replacement.  If after hours and replacement cannot be delayed, place a NG tube and confirm placement with an abdominal x-ray.    Larkin Ina, MS, RD, LDN (she/her/hers) RD pager number and weekend/on-call pager number located in [place].

## 2020-11-01 NOTE — Significant Event (Addendum)
  Patient Name: [name]   MRN: [id number]   Date of Birth/ Sex: February 07, 1946 , female      Admission Date: 10/08/2020  Attending Provider: Stark Klein, MD  Primary Diagnosis: <principal problem not specified>    Indication: Pt was in her usual state of health until this 0524, when she was noted to be in asystole. Code blue was subsequently called. At the time of arrival on scene, ACLS protocol was underway.   Technical Description:  - CPR performance duration:  6 minutes  - Was defibrillation or cardioversion used? No   - Was external pacer placed? No  - Was patient intubated pre/post CPR? Yes   Medications Administered: Y = Yes; Blank = No Amiodarone  N  Atropine  N  Calcium  N  Epinephrine  Y  Lidocaine  N  Magnesium  N  Norepinephrine  N  Phenylephrine  N  Sodium bicarbonate  N  Vasopressin  N   Post CPR evaluation:  - Final Status - Was patient successfully resuscitated ? Yes - What is current hemodynamic status? STABLE   Miscellaneous Information:  - Labs sent, including: BMP, CXR  - Primary team notified?  Yes  - Family Notified? Yes, Husband notified via telephone.   - Additional notes/ transfer status: Intubated and transferred to [id number]      Eulis Foster, MD  11/01/2020, 6:01 AM

## 2020-11-01 NOTE — Progress Notes (Signed)
Chaplain responded to Code Blue.  Family not present but contacted.  Pt being moved to [employer]. Will be available if family arrives and also will refer pt to daytime heart unit chaplain. Rev. Tamsen Snider Pager [phone]

## 2020-11-01 NOTE — Plan of Care (Signed)
  Problem: Education: Goal: Knowledge of General Education information will improve Description: Including pain rating scale, medication(s)/side effects and non-pharmacologic comfort measures Outcome: Progressing   Problem: Health Behavior/Discharge Planning: Goal: Ability to manage health-related needs will improve Outcome: Progressing   Problem: Clinical Measurements: Goal: Ability to maintain clinical measurements within normal limits will improve Outcome: Progressing Goal: Will remain free from infection Outcome: Progressing Goal: Diagnostic test results will improve Outcome: Progressing Goal: Respiratory complications will improve Outcome: Progressing Goal: Cardiovascular complication will be avoided Outcome: Progressing   Problem: Activity: Goal: Risk for activity intolerance will decrease Outcome: Progressing   Problem: Nutrition: Goal: Adequate nutrition will be maintained Outcome: Progressing   Problem: Coping: Goal: Level of anxiety will decrease Outcome: Progressing   Problem: Safety: Goal: Ability to remain free from injury will improve Outcome: Progressing   Problem: Skin Integrity: Goal: Risk for impaired skin integrity will decrease Outcome: Progressing   Problem: Education: Goal: Required Educational Video(s) Outcome: Progressing

## 2020-11-01 NOTE — Procedures (Signed)
Bedside Bronchoscopy Procedure Note DANIAL SISLEY [id number] Jan 05, 1946  Procedure: Bronchoscopy Indications: Diagnostic evaluation of the airways and Remove secretions  Procedure Details: ET Tube Size: 7.5 ET Tube secured at lip (cm): 23 Bite block in place: No In preparation for procedure, Patient hyper-oxygenated with 100 % FiO2 Airway entered and the following bronchi were examined: RUL, RML, RLL, LUL, LLL, and Bronchi.   Bronchoscope removed. Patient remains on 100% FiO2 at conclusion of procedure.    Evaluation BP (!) 124/57   Pulse 71   Temp 97.7 F (36.5 C) (Oral)   Resp (!) 32   Ht 4\' 11"  (1.499 m)   Wt 102.1 kg   SpO2 (!) 84%   BMI 45.46 kg/m  Breath Sounds:Diminished and Rhonch O2 sats:  mid to high 80's prior to & post procedure Patient's Current Condition: stable Specimens:  None Complications: No apparent complications Patient did tolerate procedure well.   Kathie Dike 11/01/2020, 12:24 PM

## 2020-11-02 ENCOUNTER — Inpatient Hospital Stay (HOSPITAL_COMMUNITY): Payer: Medicare PPO

## 2020-11-02 DIAGNOSIS — Z903 Acquired absence of stomach [part of]: Secondary | ICD-10-CM | POA: Diagnosis not present

## 2020-11-02 DIAGNOSIS — Z0189 Encounter for other specified special examinations: Secondary | ICD-10-CM

## 2020-11-02 DIAGNOSIS — N179 Acute kidney failure, unspecified: Secondary | ICD-10-CM

## 2020-11-02 DIAGNOSIS — R569 Unspecified convulsions: Secondary | ICD-10-CM | POA: Diagnosis not present

## 2020-11-02 DIAGNOSIS — R6521 Severe sepsis with septic shock: Secondary | ICD-10-CM

## 2020-11-02 DIAGNOSIS — I469 Cardiac arrest, cause unspecified: Secondary | ICD-10-CM | POA: Diagnosis not present

## 2020-11-02 DIAGNOSIS — I248 Other forms of acute ischemic heart disease: Secondary | ICD-10-CM | POA: Diagnosis not present

## 2020-11-02 DIAGNOSIS — E872 Acidosis: Secondary | ICD-10-CM

## 2020-11-02 LAB — TROPONIN I (HIGH SENSITIVITY): Troponin I (High Sensitivity): 28 ng/L — ABNORMAL HIGH (ref <18)

## 2020-11-02 LAB — PHOSPHORUS: Phosphorus: 3.8 mg/dL (ref 2.5–4.6)

## 2020-11-02 LAB — CBC WITH DIFFERENTIAL/PLATELET
Abs Immature Granulocytes: 0 10*3/uL (ref 0.00–0.07)
Basophils Absolute: 0 10*3/uL (ref 0.0–0.1)
Basophils Relative: 0 %
Eosinophils Absolute: 0 10*3/uL (ref 0.0–0.5)
Eosinophils Relative: 0 %
HCT: 27.9 % — ABNORMAL LOW (ref 36.0–46.0)
Hemoglobin: 8.7 g/dL — ABNORMAL LOW (ref 12.0–15.0)
Lymphocytes Relative: 0 %
Lymphs Abs: 0 10*3/uL — ABNORMAL LOW (ref 0.7–4.0)
MCH: 29.1 pg (ref 26.0–34.0)
MCHC: 31.2 g/dL (ref 30.0–36.0)
MCV: 93.3 fL (ref 80.0–100.0)
Monocytes Absolute: 2 10*3/uL — ABNORMAL HIGH (ref 0.1–1.0)
Monocytes Relative: 6 %
Neutro Abs: 31.8 10*3/uL — ABNORMAL HIGH (ref 1.7–7.7)
Neutrophils Relative %: 94 %
Platelets: 218 10*3/uL (ref 150–400)
RBC: 2.99 MIL/uL — ABNORMAL LOW (ref 3.87–5.11)
RDW: 15.4 % (ref 11.5–15.5)
WBC: 33.8 10*3/uL — ABNORMAL HIGH (ref 4.0–10.5)
nRBC: 0 /100 WBC
nRBC: 0.3 % — ABNORMAL HIGH (ref 0.0–0.2)

## 2020-11-02 LAB — GLUCOSE, CAPILLARY
Glucose-Capillary: 142 mg/dL — ABNORMAL HIGH (ref 70–99)
Glucose-Capillary: 164 mg/dL — ABNORMAL HIGH (ref 70–99)
Glucose-Capillary: 181 mg/dL — ABNORMAL HIGH (ref 70–99)
Glucose-Capillary: 228 mg/dL — ABNORMAL HIGH (ref 70–99)
Glucose-Capillary: 228 mg/dL — ABNORMAL HIGH (ref 70–99)
Glucose-Capillary: 230 mg/dL — ABNORMAL HIGH (ref 70–99)

## 2020-11-02 LAB — COOXEMETRY PANEL
Carboxyhemoglobin: 0.8 % (ref 0.5–1.5)
Methemoglobin: 0.7 % (ref 0.0–1.5)
O2 Saturation: 69.5 %
Total hemoglobin: 14.4 g/dL (ref 12.0–16.0)

## 2020-11-02 LAB — COMPREHENSIVE METABOLIC PANEL
ALT: 17 U/L (ref 0–44)
AST: 23 U/L (ref 15–41)
Albumin: 1.8 g/dL — ABNORMAL LOW (ref 3.5–5.0)
Alkaline Phosphatase: 57 U/L (ref 38–126)
Anion gap: 11 (ref 5–15)
BUN: 17 mg/dL (ref 8–23)
CO2: 21 mmol/L — ABNORMAL LOW (ref 22–32)
Calcium: 8.2 mg/dL — ABNORMAL LOW (ref 8.9–10.3)
Chloride: 99 mmol/L (ref 98–111)
Creatinine, Ser: 1.3 mg/dL — ABNORMAL HIGH (ref 0.44–1.00)
GFR, Estimated: 42 mL/min — ABNORMAL LOW (ref >60)
Glucose, Bld: 164 mg/dL — ABNORMAL HIGH (ref 70–99)
Potassium: 3.7 mmol/L (ref 3.5–5.1)
Sodium: 131 mmol/L — ABNORMAL LOW (ref 135–145)
Total Bilirubin: 7 mg/dL — ABNORMAL HIGH (ref 0.3–1.2)
Total Protein: 4.9 g/dL — ABNORMAL LOW (ref 6.5–8.1)

## 2020-11-02 LAB — MAGNESIUM
Magnesium: 1.2 mg/dL — ABNORMAL LOW (ref 1.7–2.4)
Magnesium: 2.6 mg/dL — ABNORMAL HIGH (ref 1.7–2.4)

## 2020-11-02 LAB — ECHOCARDIOGRAM COMPLETE
Area-P 1/2: 4.04 cm2
Calc EF: 71.3 %
Height: 59 in
S' Lateral: 2.4 cm
Single Plane A2C EF: 59 %
Single Plane A4C EF: 80.6 %
Weight: 3626.13 oz

## 2020-11-02 LAB — LACTIC ACID, PLASMA
Lactic Acid, Venous: 3.4 mmol/L (ref 0.5–1.9)
Lactic Acid, Venous: 2.1 mmol/L (ref 0.5–1.9)

## 2020-11-02 MED ORDER — POTASSIUM CHLORIDE 20 MEQ PO PACK
40.0000 meq | PACK | Freq: Once | ORAL | Status: AC
  Administered 2020-11-02: 40 meq
  Filled 2020-11-02: qty 2

## 2020-11-02 MED ORDER — VASOPRESSIN 20 UNITS/100 ML INFUSION FOR SHOCK
0.0000 [IU]/min | INTRAVENOUS | Status: DC
  Administered 2020-11-02: 0.02 [IU]/min via INTRAVENOUS
  Administered 2020-11-02: 0.03 [IU]/min via INTRAVENOUS
  Filled 2020-11-02 (×3): qty 100

## 2020-11-02 MED ORDER — MAGNESIUM SULFATE 4 GM/100ML IV SOLN
4.0000 g | Freq: Once | INTRAVENOUS | Status: AC
  Administered 2020-11-02: 4 g via INTRAVENOUS
  Filled 2020-11-02: qty 100

## 2020-11-02 MED ORDER — SODIUM CHLORIDE 0.9 % IV SOLN
2.0000 g | Freq: Two times a day (BID) | INTRAVENOUS | Status: DC
  Administered 2020-11-02 – 2020-11-04 (×5): 2 g via INTRAVENOUS
  Filled 2020-11-02 (×5): qty 2

## 2020-11-02 MED ORDER — MAGNESIUM SULFATE 2 GM/50ML IV SOLN: 2.0000 g | Freq: Once | INTRAVENOUS | Status: DC

## 2020-11-02 MED ORDER — PERFLUTREN LIPID MICROSPHERE
1.0000 mL | INTRAVENOUS | Status: AC | PRN
  Administered 2020-11-02: 2 mL via INTRAVENOUS
  Filled 2020-11-02: qty 10

## 2020-11-02 MED ORDER — MAGNESIUM SULFATE 4 GM/100ML IV SOLN: 4.0000 g | Freq: Once | INTRAVENOUS | Status: DC

## 2020-11-02 MED ORDER — NOREPINEPHRINE 16 MG/250ML-% IV SOLN
0.0000 ug/min | INTRAVENOUS | Status: DC
  Administered 2020-11-02: 20 ug/min via INTRAVENOUS
  Administered 2020-11-03: 2 ug/min via INTRAVENOUS
  Filled 2020-11-02 (×3): qty 250

## 2020-11-02 MED ORDER — HYDROCORTISONE NA SUCCINATE PF 100 MG IJ SOLR
100.0000 mg | Freq: Two times a day (BID) | INTRAMUSCULAR | Status: DC
  Administered 2020-11-02 (×2): 100 mg via INTRAVENOUS
  Filled 2020-11-02 (×2): qty 2

## 2020-11-02 MED ORDER — MAGNESIUM SULFATE 2 GM/50ML IV SOLN
2.0000 g | Freq: Once | INTRAVENOUS | Status: AC
  Administered 2020-11-02: 2 g via INTRAVENOUS
  Filled 2020-11-02: qty 50

## 2020-11-02 NOTE — Progress Notes (Signed)
[facility] ADULT ICU REPLACEMENT PROTOCOL   The patient does apply for the [facility] Adult ICU Electrolyte Replacment Protocol based on the criteria listed below:   1. Is GFR >/= 30 ml/min? Yes.    Patient's GFR today is 42 2. Is SCr </= 2? Yes.   Patient's SCr is 1.3 ml/kg/hr 3. Did SCr increase >/= 0.5 in 24 hours? No. 4. Abnormal electrolyte(s):   K 3.7, Mg 1.2 5. Ordered repletion with: protocol 6. If a panic level lab has been reported, has the CCM MD in charge been notified?  Physician:  Lowella Bandy R Riely Baskett 11/02/2020 6:06 AM

## 2020-11-02 NOTE — Progress Notes (Signed)
NAME:  [name], MRN:  [id number], DOB:  [birth year], LOS: 8 ADMISSION DATE:  10/26/2020, CONSULTATION DATE:  7/5 REFERRING MD:  Barry Dienes, CHIEF COMPLAINT:  Cardiac arrest   History of Present Illness:  [name] is a 75 y.o. 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 She had procedure done 6/28.  Pathology was negative for malignancy but showed LGD with negative nodes.   Early AM 7/5, she had wheezing so received a breathing treatment.  Shortly thereafter, she was found with emesis all over bed and floor.  She had gurgling sounds and altered LOC.  She ended up having loss of pulses with initial rhythm asystole.  She received 1mg  epinephrine before PEA for which she received a 2nd dose of epinephrine.  She then had ROSC after 6 minutes.  She was intubated during the code.   She was transferred to [facility] ICU where PCCM was asked to assist with vent management.  Pertinent  Medical History  OSA DM2 Hypertension CAD Allergic rhinitis   Significant Hospital Events: Including procedures, antibiotic start and stop dates in addition to other pertinent events   6/28 > admit, robotic distal gastrectomy,  7/5 > 6 minute code (asystole then PEA), severe hypoxemic respiratory failure and shock 7/5 levaquin x1 7/5 ceftriaxone > 7/6 7/5 doxycycline > 7/6 7/6 cefepime >   Interim History / Subjective:  Severe hypoxemia Started on intermittent vecuronium protocol yesterday Shock worsening Some intermittent muscle twitching R arm WBC remains elevated  Objective   Blood pressure (!) 144/50, pulse 77, temperature 97.9 F (36.6 C), resp. rate (!) 32, height 4\' 11"  (1.499 m), weight 102.8 kg, SpO2 96 %.    Vent Mode: PRVC FiO2 (%):  [80 %-100 %] 80 % Set Rate:  [25 bmp-32 bmp] 32 bmp Vt Set:  [360 mL] 360 mL PEEP:  [10 cmH20-14 cmH20] 14  cmH20 Plateau Pressure:  [27 cmH20-28 cmH20] 28 cmH20   Intake/Output Summary (Last 24 hours) at 11/02/2020 0718 Last data filed at 11/02/2020 0400 Gross per 24 hour  Intake 3515.04 ml  Output 1880 ml  Net 1635.04 ml   Filed Weights   10/09/2020 0616 11/01/20 0600 11/02/20 0400  Weight: 94.8 kg 102.1 kg 102.8 kg    Examination:  General:  In bed on vent HENT: NCAT ETT in place PULM: CTA B, vent supported breathing CV: RRR, no mgr GI: BS+, soft, nontender MSK: normal bulk and tone Neuro: sedated on vent, intermittent convulsive movement of R arm, cough with suctioning  ABG    Component Value Date/Time   PHART 7.202 (L) 11/01/2020 1005   PCO2ART 56.5 (H) 11/01/2020 1005   PO2ART 56 (L) 11/01/2020 1005   HCO3 22.2 11/01/2020 1005   TCO2 24 11/01/2020 1005   ACIDBASEDEF 6.0 (H) 11/01/2020 1005   O2SAT 81.0 11/01/2020 1005   7/5 resp culture > GPC, mod Gram pos rods  7/6 CXR > dense infiltrate R lung, concern for bibasilar air space disease  Resolved Hospital Problem list     Assessment & Plan:  Worsening septic shock from aspiration pneumonia, WBC up > f/u resp culture result > change from ceftriaxone/doxy to cefepime > check CVP > add vasopressin > titrate levophed for MAP < 65 > add stress dose steroids3 > check troponin and coox for completeness > check echocardiogram  Severe acute respiratory failure with hypoxemia > continue left lung down >  Continue mechanical ventilation per coventional ventilator protocol > target TVol 6-8cc/kgIBW > target Plateau Pressure < 30cm H20 > target driving pressure less than 15 cm of water > target PaO2 55-65: titrate PEEP/FiO2 per protocol > check CVP daily if CVL in place > target CVP less than 4, diurese as necessary if not on vasopressors > Ventilator associated pneumonia prevention protocol  Metabolic acidosis : worse today, suspect lactic acidosis > check lactic acid > monitor ABG > continue vent at current  settings  AKI > Monitor BMET and UOP > Replace electrolytes as needed > supportive care as above  Abnormal motor movement on 7/6> myoclonus? Seizure disorder > check eeg > contiue keppra  Need for sedation for mechanical ventilation High risk for anoxic brain injury Severe vent dyssynchrony > would like to hold sedation for neuro prognostication but cannot do that with severe vent dyssnchrony > RASS target -4 to -5 > continue intermittent paralytic protocol today considering the severity of lung injury and ventilator dyssynchrony > fentanyl/versed infusion per PAD protocol  Ileus post partial gastrectomy for malignant polyp > tube feeding > care per primary service  DM2 > SSI, monitor accucheck q4h   Best Practice (right click and "Reselect all SmartList Selections" daily)   Diet/type: tubefeeds DVT prophylaxis: LMWH GI prophylaxis: PPI Lines: Central line and yes and it is still needed Foley:  N/A Code Status:  full code Last date of multidisciplinary goals of care discussion [hasn't happened]  Labs   CBC: Recent Labs  Lab 10/29/20 0316 10/30/20 0048 10/31/20 0042 11/01/20 0600 11/01/20 0604 11/01/20 0801 11/01/20 1005 11/02/20 0428  WBC 18.1* 21.2* 19.2*  --  33.2*  --   --  33.8*  NEUTROABS  --   --   --   --   --   --   --  31.8*  HGB 8.8* 9.6* 9.1* 9.2* 9.3* 9.9* 9.9* 8.7*  HCT 27.8* 30.2* 29.2* 27.0* 29.4* 29.0* 29.0* 27.9*  MCV 94.6 92.4 93.3  --  93.6  --   --  93.3  PLT PLATELET CLUMPS NOTED ON SMEAR, UNABLE TO ESTIMATE 233 246  --  257  --   --  756    Basic Metabolic Panel: Recent Labs  Lab 10/29/20 0316 10/30/20 0048 10/31/20 0042 11/01/20 0212 11/01/20 0600 11/01/20 0604 11/01/20 0801 11/01/20 1005 11/02/20 0428  NA 135 134* 132*  --  133* 132* 133* 133* 131*  K 4.9 4.6 4.6  --  3.6 3.8 3.9 3.5 3.7  CL 105 103 105  --   --  100  --   --  99  CO2 21* 23 20*  --   --  20*  --   --  21*  GLUCOSE 126* 174* 146*  --   --  212*  --   --   164*  BUN 23 23 19   --   --  18  --   --  17  CREATININE 1.30* 1.17* 1.05* 1.15*  --  1.28*  --   --  1.30*  CALCIUM 8.7* 8.5* 8.5*  --   --  8.3*  --   --  8.2*  MG  --   --   --   --   --   --   --   --  1.2*  PHOS  --   --   --   --   --   --   --   --  3.8   GFR: Estimated  Creatinine Clearance: 38.3 mL/min (A) (by C-G formula based on SCr of 1.3 mg/dL (H)). Recent Labs  Lab 10/30/20 0048 10/31/20 0042 11/01/20 0604 11/01/20 0950 11/02/20 0428  WBC 21.2* 19.2* 33.2*  --  33.8*  LATICACIDVEN  --   --  4.8* 3.7*  --     Liver Function Tests: Recent Labs  Lab 11/02/20 0428  AST 23  ALT 17  ALKPHOS 57  BILITOT 7.0*  PROT 4.9*  ALBUMIN 1.8*   No results for input(s): LIPASE, AMYLASE in the last 168 hours. No results for input(s): AMMONIA in the last 168 hours.  ABG    Component Value Date/Time   PHART 7.202 (L) 11/01/2020 1005   PCO2ART 56.5 (H) 11/01/2020 1005   PO2ART 56 (L) 11/01/2020 1005   HCO3 22.2 11/01/2020 1005   TCO2 24 11/01/2020 1005   ACIDBASEDEF 6.0 (H) 11/01/2020 1005   O2SAT 81.0 11/01/2020 1005     Coagulation Profile: No results for input(s): INR, PROTIME in the last 168 hours.  Cardiac Enzymes: No results for input(s): CKTOTAL, CKMB, CKMBINDEX, TROPONINI in the last 168 hours.  HbA1C: Hgb A1c MFr Bld  Date/Time Value Ref Range Status  10/19/2020 11:50 AM 6.1 (H) 4.8 - 5.6 % Final    Comment:    (NOTE) Pre diabetes:          5.7%-6.4%  Diabetes:              >6.4%  Glycemic control for   <7.0% adults with diabetes   10/21/2013 03:57 PM 6.4 (H) <5.7 % Final    Comment:    (NOTE)                                                                       According to the ADA Clinical Practice Recommendations for 2011, when HbA1c is used as a screening test:  >=6.5%   Diagnostic of Diabetes Mellitus           (if abnormal result is confirmed) 5.7-6.4%   Increased risk of developing Diabetes Mellitus References:Diagnosis and  Classification of Diabetes Mellitus,Diabetes VXBL,3903,00(PQZRA 1):S62-S69 and Standards of Medical Care in         Diabetes - 2011,Diabetes QTMA,2633,35 (Suppl 1):S11-S61.    CBG: Recent Labs  Lab 11/01/20 1139 11/01/20 1555 11/01/20 1922 11/01/20 2319 11/02/20 0413  GLUCAP 135* 130* 112* 127* 142*    Critical care time: 40 minutes    Roselie Awkward, MD [place] [facility] Pager: [phone] Cell: [phone] If no response, please call [phone] until 7pm After 7:00 pm call Elink  [phone]

## 2020-11-02 NOTE — Progress Notes (Signed)
Progress Note  8 Days Post-Op  Subjective: Pt coded yesterday early AM from aspiration.  She was intubated and transferred to ICU.  She was on 3 pressors.    Objective: Vital signs in last 24 hours: Temp:  [96.7 F (35.9 C)-97.9 F (36.6 C)] 97.4 F (36.3 C) (07/06 1115) Pulse Rate:  [49-100] 81 (07/06 1245) Resp:  [21-34] 32 (07/06 1245) BP: (124-159)/(43-61) 159/46 (07/06 1200) SpO2:  [87 %-100 %] 94 % (07/06 1245) Arterial Line BP: (86-213)/(14-89) 151/42 (07/06 1245) FiO2 (%):  [70 %-100 %] 70 % (07/06 1145) Weight:  [102.8 kg] 102.8 kg (07/06 0400) Last BM Date: 10/31/20  Intake/Output from previous day: 07/05 0701 - 07/06 0700 In: 3824.8 [I.V.:3054.2; NG/GT:390; IV Piggyback:380.6] Out: 1880 [Urine:1610; Emesis/NG output:270] Intake/Output this shift: Total I/O In: 624.7 [I.V.:304.9; IV Piggyback:319.8] Out: 730 [Emesis/NG output:730]  PE: General: intubated, sedated.  Heart: regular, rate, and rhythm.   Resp: on vent, coarse breath sounds.   GI - soft, non distended, cannot assess tenderness, but patient does shake a bit with exam Neuro- per nursing, pt does move extremities when sedation is lighter.    Lab Results:  Recent Labs    11/01/20 0604 11/01/20 0801 11/01/20 1005 11/02/20 0428  WBC 33.2*  --   --  33.8*  HGB 9.3*   < > 9.9* 8.7*  HCT 29.4*   < > 29.0* 27.9*  PLT 257  --   --  218   < > = values in this interval not displayed.   BMET Recent Labs    11/01/20 0604 11/01/20 0801 11/01/20 1005 11/02/20 0428  NA 132*   < > 133* 131*  K 3.8   < > 3.5 3.7  CL 100  --   --  99  CO2 20*  --   --  21*  GLUCOSE 212*  --   --  164*  BUN 18  --   --  17  CREATININE 1.28*  --   --  1.30*  CALCIUM 8.3*  --   --  8.2*   < > = values in this interval not displayed.   PT/INR No results for input(s): LABPROT, INR in the last 72 hours. CMP     Component Value Date/Time   NA 131 (L) 11/02/2020 0428   K 3.7 11/02/2020 0428   CL 99 11/02/2020  0428   CO2 21 (L) 11/02/2020 0428   GLUCOSE 164 (H) 11/02/2020 0428   BUN 17 11/02/2020 0428   CREATININE 1.30 (H) 11/02/2020 0428   CALCIUM 8.2 (L) 11/02/2020 0428   PROT 4.9 (L) 11/02/2020 0428   ALBUMIN 1.8 (L) 11/02/2020 0428   AST 23 11/02/2020 0428   ALT 17 11/02/2020 0428   ALKPHOS 57 11/02/2020 0428   BILITOT 7.0 (H) 11/02/2020 0428   GFRNONAA 42 (L) 11/02/2020 0428   GFRAA 58 (L) 02/08/2017 1025   Lipase     Component Value Date/Time   LIPASE 29 07/17/2007 2035       Studies/Results: Portable Chest xray  Result Date: 11/02/2020 CLINICAL DATA:  Intubation.  Respiratory failure. EXAM: PORTABLE CHEST 1 VIEW COMPARISON:  11/01/2020. FINDINGS: Endotracheal tube, NG tube, left IJ line in stable position. Stable cardiomegaly. Prominent dense bilateral pulmonary infiltrates/edema again noted without interim change. Left pleural effusion cannot be excluded. No pneumothorax. IMPRESSION: 1.  Lines and tubes stable position 2.  Stable cardiomegaly. 3. Prominent dense bilateral pulmonary infiltrates/edema again noted without interim change. Left pleural effusion cannot be  excluded. Electronically Signed   By: Marcello Moores  Register   On: 11/02/2020 07:26   DG CHEST PORT 1 VIEW  Result Date: 11/01/2020 CLINICAL DATA:  Left central line placement EXAM: PORTABLE CHEST 1 VIEW COMPARISON:  11/01/2020, 7:46 a.m. FINDINGS: Interval placement of left neck vascular catheter, tip projecting near the superior cavoatrial junction. Remaining support apparatus including endotracheal tube and esophagogastric tube are unchanged. Cardiomegaly with unchanged diffuse bilateral interstitial and heterogeneous airspace opacity. IMPRESSION: 1. Interval placement of left neck vascular catheter, tip projecting near the superior cavoatrial junction. Otherwise unchanged support apparatus. 2. Cardiomegaly with unchanged diffuse bilateral interstitial and heterogeneous airspace opacity. Electronically Signed   By: Eddie Candle M.D.   On: 11/01/2020 10:11   DG CHEST PORT 1 VIEW  Result Date: 11/01/2020 CLINICAL DATA:  Intubation. EXAM: PORTABLE CHEST 1 VIEW COMPARISON:  11/01/2020. FINDINGS: Endotracheal tube noted with tip noted 2 cm above the carina. NG tube noted tip below left hemidiaphragm. Stable cardiomegaly. Low lung volumes with progression of bilateral pulmonary infiltrates/edema. Small left pleural effusion again noted. No pneumothorax. Prior cervical spine fusion. IMPRESSION: 1. Endotracheal tube noted with tip 2 cm above the carina on this exam. NG tube tip below left hemidiaphragm. 2.  Stable cardiomegaly. 3. Low lung volumes with progression of bilateral pulmonary infiltrates/edema. Small left pleural effusion again noted. Electronically Signed   By: Marcello Moores  Register   On: 11/01/2020 07:56   DG CHEST PORT 1 VIEW  Result Date: 11/01/2020 CLINICAL DATA:  Intubation and OG tube placement.  Cardiac arrest. EXAM: PORTABLE CHEST 1 VIEW COMPARISON:  10/28/2020. FINDINGS: Endotracheal tube noted with tip 1 cm above the carina. Proximal repositioning of approximately 2 cm should be considered. NG tube noted with tip below left hemidiaphragm. Stable cardiomegaly. No pulmonary venous congestion. Low lung volumes with bibasilar atelectasis/infiltrates. Small left pleural effusion cannot be excluded. No pneumothorax. Prior cervical spine fusion. IMPRESSION: 1. Endotracheal tube noted with tip 1 cm above the carina. Proximal repositioning of approximately 2 cm should be considered. NG tube noted with tip below left hemidiaphragm. 2.  Stable cardiomegaly.  No pulmonary venous congestion. 3. Low lung volumes with bibasilar atelectasis/infiltrates. Small left pleural effusion cannot be excluded. Electronically Signed   By: Marcello Moores  Register   On: 11/01/2020 06:21   DG Abd Portable 1V  Result Date: 11/01/2020 CLINICAL DATA:  Feeding tube placement EXAM: PORTABLE ABDOMEN - 1 VIEW COMPARISON:  Radiograph 11/01/2020, UGI  10/27/2020 FINDINGS: NG tube tip projects over left upper quadrant. Feeding tube tip over the mid abdomen, small amount of enteral contrast opacifies jejunum. The patient is status post gastrectomy with gastrojejunostomy. The abdomen is relatively gasless. IMPRESSION: 1. Feeding tube tip within the jejunum. Status post gastrectomy with gastrojejunostomy 2. NG tube tip overlies the gastric remnant region. Electronically Signed   By: Donavan Foil M.D.   On: 11/01/2020 16:55   DG Abd Portable 1V  Result Date: 11/01/2020 CLINICAL DATA:  Feeding tube placement EXAM: PORTABLE ABDOMEN - 1 VIEW COMPARISON:  November 01, 2020. FINDINGS: A gastric tube terminates in the proximal stomach, side port below the expected location of the GE junction. A feeding tube is present, tip likely within the loop jejunostomy based on course and the appearance of the previous upper GI evaluation. EKG leads project over the abdomen with pacer defibrillator pads in place as well Basilar airspace disease partially visualized at the lung bases. On limited assessment no acute regional skeletal process. IMPRESSION: 1. A gastric tube terminates in the  proximal stomach, side port below the expected location of the GE junction. 2. Feeding tube tip likely within the loop jejunostomy, beyond the stomach. Electronically Signed   By: Zetta Bills M.D.   On: 11/01/2020 14:45   DG Abd Portable 1V  Result Date: 11/01/2020 CLINICAL DATA:  Intubation EXAM: PORTABLE ABDOMEN - 1 VIEW COMPARISON:  Four days ago FINDINGS: The enteric tube tip is at the stomach. The imaged abdomen shows a nonobstructive bowel gas pattern. Artifact from EKG leads and defibrillator pads. The chest is reported on dedicated film performed at the same time. IMPRESSION: Enteric tube with tip at the stomach. Electronically Signed   By: Monte Fantasia M.D.   On: 11/01/2020 06:22   EEG adult  Result Date: 11/02/2020 Lora Havens, MD     11/02/2020 11:43 AM Patient Name: [name] MRN: [id number] Epilepsy Attending: Lora Havens Referring Physician/Provider: Dr. Simonne Maffucci Date: 11/02/2020 Duration: 23.15 minutes Patient history: 75 year old female status post cardiorespiratory arrest.  Noted to have right arm twitching today.  EEG evaluate for seizures. Level of alertness: comatose AEDs during EEG study: Keppra, Versed Technical aspects: This EEG study was done with scalp electrodes positioned according to the 10-20 International system of electrode placement. Electrical activity was acquired at a sampling rate of 500Hz  and reviewed with a high frequency filter of 70Hz  and a low frequency filter of 1Hz . EEG data were recorded continuously and digitally stored. Description: EEG showed near continuous generalized 3 to 5 Hz theta-delta slowing admixed with intermittent generalized 15 to 18 Hz beta activity.  Hyperventilation and photic stimulation were not performed.   ABNORMALITY - Continuous slow, generalized IMPRESSION: This study is suggestive of severe diffuse encephalopathy, nonspecific etiology but could be related to sedation, toxic-metabolic etiology, anoxic/hypoxic brain injury. No seizures or epileptiform discharges were seen throughout the recording. Priyanka [name]    Anti-infectives: Anti-infectives (From admission, onward)    Start     Dose/Rate Route Frequency Ordered Stop   11/02/20 1000  cefTRIAXone (ROCEPHIN) 2 g in sodium chloride 0.9 % 100 mL IVPB  Status:  Discontinued        2 g 200 mL/hr over 30 Minutes Intravenous Every 24 hours 11/01/20 1257 11/02/20 0856   11/02/20 1000  doxycycline (VIBRAMYCIN) 100 mg in sodium chloride 0.9 % 250 mL IVPB  Status:  Discontinued        100 mg 125 mL/hr over 120 Minutes Intravenous Every 12 hours 11/01/20 1257 11/02/20 0856   11/02/20 1000  ceFEPIme (MAXIPIME) 2 g in sodium chloride 0.9 % 100 mL IVPB        2 g 200 mL/hr over 30 Minutes Intravenous Every 12 hours 11/02/20 0856     11/01/20 0915   levofloxacin (LEVAQUIN) IVPB 750 mg  Status:  Discontinued        750 mg 100 mL/hr over 90 Minutes Intravenous Every 24 hours 11/01/20 0827 11/01/20 1257   11/01/20 0730  levofloxacin (LEVAQUIN) IVPB 500 mg  Status:  Discontinued        500 mg 100 mL/hr over 60 Minutes Intravenous Every 24 hours 11/01/20 0631 11/01/20 1257   10/11/2020 2100  ciprofloxacin (CIPRO) IVPB 400 mg        400 mg 200 mL/hr over 60 Minutes Intravenous Every 12 hours 10/22/2020 1258 10/04/2020 2148   10/20/2020 0600  ciprofloxacin (CIPRO) IVPB 400 mg        400 mg 200 mL/hr over 60 Minutes Intravenous On call to  O.R. 10/24/2020 0547 10/12/2020 0847        Assessment/Plan s/p Procedure(s): XI ROBOTIC ASSISTED DISTAL GASTRECTOMY (N/A)   Pathology from surgery 6/28, low grade dysplasia.  No cancer and 13 negative nodes.     CT got put on hold due to aspiration event.  Aspiration was witnessed and patient immediately got CPR and drugs with ROSC.   Shock - presumed septic shock. On broad spectrum antibiotics.  Seems to be secondary to aspiration, but WBCs were elevated pre aspiration event.  On vaso and levophed; third pressor weaned off.   VDRF - relatively high vent settings.  Rate 32, FiO2 70%, PEEP 14.   Neuro- question of seizures.  EEG done, showed generalized slowing and no seizures.   Echo pending.     Presumed functional gastric outlet obstruction.  Upper gi earlier was negative for leak or for obstruction.  Keep NPO for now.  Consider TPN. Will discuss with Dr. Lake Bells.      LOS: 8 days   Milus Height, MD FACS Surgical Oncology, General Surgery, Trauma and [facility], [place] for weekday/non holidays Check [email or web address] for coverage night/weekend/holidays  Do not use SecureChat as it is not reliable for timely patient care.

## 2020-11-02 NOTE — Progress Notes (Signed)
PT Cancellation Note  Patient Details Name: [name] MRN: [id number] DOB: 1945-08-19   Cancelled Treatment:    Reason Eval/Treat Not Completed: Other (comment);Medical issues which prohibited therapy (pt intubated/vented after aspirating vomitus).  Signing off at this time due to medical decline, please reorder PT when pt is more appropriate to participate. 11/02/2020  Ginger Carne., PT [facility] [phone]  (pager) [phone]  (office)   Tessie Fass Stephanne Greeley 11/02/2020, 4:54 PM

## 2020-11-02 NOTE — Progress Notes (Signed)
EEG complete - results pending 

## 2020-11-02 NOTE — Plan of Care (Signed)
  Problem: Education: Goal: Knowledge of General Education information will improve Description: Including pain rating scale, medication(s)/side effects and non-pharmacologic comfort measures Outcome: Progressing   Problem: Health Behavior/Discharge Planning: Goal: Ability to manage health-related needs will improve Outcome: Progressing   Problem: Clinical Measurements: Goal: Ability to maintain clinical measurements within normal limits will improve Outcome: Progressing Goal: Will remain free from infection Outcome: Progressing Goal: Diagnostic test results will improve Outcome: Progressing Goal: Respiratory complications will improve Outcome: Progressing Goal: Cardiovascular complication will be avoided Outcome: Progressing   Problem: Nutrition: Goal: Adequate nutrition will be maintained Outcome: Progressing   Problem: Coping: Goal: Level of anxiety will decrease Outcome: Progressing   Problem: Elimination: Goal: Will not experience complications related to bowel motility Outcome: Progressing Goal: Will not experience complications related to urinary retention Outcome: Progressing   Problem: Pain Managment: Goal: General experience of comfort will improve Outcome: Progressing   Problem: Clinical Measurements: Goal: Ability to maintain clinical measurements within normal limits will improve Outcome: Progressing Goal: Postoperative complications will be avoided or minimized Outcome: Progressing   Problem: Skin Integrity: Goal: Demonstration of wound healing without infection will improve Outcome: Progressing   Problem: Activity: Goal: Ability to tolerate increased activity will improve Outcome: Progressing   Problem: Respiratory: Goal: Ability to maintain a clear airway and adequate ventilation will improve Outcome: Progressing

## 2020-11-02 NOTE — Procedures (Signed)
Patient Name: [name]  MRN: [id number]  Epilepsy Attending: Lora Havens  Referring Physician/Provider: Dr. Simonne Maffucci Date: 11/02/2020 Duration: 23.15 minutes  Patient history: 75 year old female status post cardiorespiratory arrest.  Noted to have right arm twitching today.  EEG evaluate for seizures.  Level of alertness: comatose  AEDs during EEG study: Keppra, Versed  Technical aspects: This EEG study was done with scalp electrodes positioned according to the 10-20 International system of electrode placement. Electrical activity was acquired at a sampling rate of 500Hz  and reviewed with a high frequency filter of 70Hz  and a low frequency filter of 1Hz . EEG data were recorded continuously and digitally stored.   Description: EEG showed near continuous generalized 3 to 5 Hz theta-delta slowing admixed with intermittent generalized 15 to 18 Hz beta activity.  Hyperventilation and photic stimulation were not performed.     ABNORMALITY - Continuous slow, generalized  IMPRESSION: This study is suggestive of severe diffuse encephalopathy, nonspecific etiology but could be related to sedation, toxic-metabolic etiology, anoxic/hypoxic brain injury. No seizures or epileptiform discharges were seen throughout the recording.  Karren Newland [name]

## 2020-11-03 DIAGNOSIS — Z903 Acquired absence of stomach [part of]: Secondary | ICD-10-CM | POA: Diagnosis not present

## 2020-11-03 DIAGNOSIS — I469 Cardiac arrest, cause unspecified: Secondary | ICD-10-CM | POA: Diagnosis not present

## 2020-11-03 LAB — CBC WITH DIFFERENTIAL/PLATELET
Abs Immature Granulocytes: 1.73 10*3/uL — ABNORMAL HIGH (ref 0.00–0.07)
Basophils Absolute: 0.1 10*3/uL (ref 0.0–0.1)
Basophils Relative: 0 %
Eosinophils Absolute: 0 10*3/uL (ref 0.0–0.5)
Eosinophils Relative: 0 %
HCT: 24.5 % — ABNORMAL LOW (ref 36.0–46.0)
Hemoglobin: 7.4 g/dL — ABNORMAL LOW (ref 12.0–15.0)
Immature Granulocytes: 7 %
Lymphocytes Relative: 5 %
Lymphs Abs: 1.2 10*3/uL (ref 0.7–4.0)
MCH: 28.8 pg (ref 26.0–34.0)
MCHC: 30.2 g/dL (ref 30.0–36.0)
MCV: 95.3 fL (ref 80.0–100.0)
Monocytes Absolute: 0.6 10*3/uL (ref 0.1–1.0)
Monocytes Relative: 3 %
Neutro Abs: 19.9 10*3/uL — ABNORMAL HIGH (ref 1.7–7.7)
Neutrophils Relative %: 85 %
Platelets: 168 10*3/uL (ref 150–400)
RBC: 2.57 MIL/uL — ABNORMAL LOW (ref 3.87–5.11)
RDW: 15.9 % — ABNORMAL HIGH (ref 11.5–15.5)
WBC: 23.5 10*3/uL — ABNORMAL HIGH (ref 4.0–10.5)
nRBC: 0.3 % — ABNORMAL HIGH (ref 0.0–0.2)

## 2020-11-03 LAB — COMPREHENSIVE METABOLIC PANEL
ALT: 13 U/L (ref 0–44)
AST: 19 U/L (ref 15–41)
Albumin: 1.6 g/dL — ABNORMAL LOW (ref 3.5–5.0)
Alkaline Phosphatase: 80 U/L (ref 38–126)
Anion gap: 9 (ref 5–15)
BUN: 20 mg/dL (ref 8–23)
CO2: 20 mmol/L — ABNORMAL LOW (ref 22–32)
Calcium: 8.4 mg/dL — ABNORMAL LOW (ref 8.9–10.3)
Chloride: 104 mmol/L (ref 98–111)
Creatinine, Ser: 1.27 mg/dL — ABNORMAL HIGH (ref 0.44–1.00)
GFR, Estimated: 44 mL/min — ABNORMAL LOW (ref >60)
Glucose, Bld: 259 mg/dL — ABNORMAL HIGH (ref 70–99)
Potassium: 4.2 mmol/L (ref 3.5–5.1)
Sodium: 133 mmol/L — ABNORMAL LOW (ref 135–145)
Total Bilirubin: 5.3 mg/dL — ABNORMAL HIGH (ref 0.3–1.2)
Total Protein: 4.6 g/dL — ABNORMAL LOW (ref 6.5–8.1)

## 2020-11-03 LAB — GLUCOSE, CAPILLARY
Glucose-Capillary: 175 mg/dL — ABNORMAL HIGH (ref 70–99)
Glucose-Capillary: 233 mg/dL — ABNORMAL HIGH (ref 70–99)
Glucose-Capillary: 235 mg/dL — ABNORMAL HIGH (ref 70–99)
Glucose-Capillary: 242 mg/dL — ABNORMAL HIGH (ref 70–99)
Glucose-Capillary: 146 mg/dL — ABNORMAL HIGH (ref 70–99)
Glucose-Capillary: 133 mg/dL — ABNORMAL HIGH (ref 70–99)
Glucose-Capillary: 102 mg/dL — ABNORMAL HIGH (ref 70–99)

## 2020-11-03 LAB — CULTURE, RESPIRATORY W GRAM STAIN: Culture: NORMAL

## 2020-11-03 MED ORDER — VITAL 1.5 CAL PO LIQD
1000.0000 mL | ORAL | Status: DC
  Administered 2020-11-03 (×3): 1000 mL

## 2020-11-03 MED ORDER — INSULIN ASPART 100 UNIT/ML IJ SOLN
0.0000 [IU] | INTRAMUSCULAR | Status: DC
  Administered 2020-11-03: 3 [IU] via SUBCUTANEOUS
  Administered 2020-11-04 (×2): 2 [IU] via SUBCUTANEOUS
  Administered 2020-11-05: 3 [IU] via SUBCUTANEOUS
  Administered 2020-11-05: 2 [IU] via SUBCUTANEOUS
  Administered 2020-11-05: 3 [IU] via SUBCUTANEOUS
  Administered 2020-11-05: 2 [IU] via SUBCUTANEOUS
  Administered 2020-11-05: 3 [IU] via SUBCUTANEOUS
  Administered 2020-11-06: 2 [IU] via SUBCUTANEOUS
  Administered 2020-11-06 (×3): 3 [IU] via SUBCUTANEOUS
  Administered 2020-11-07 (×2): 2 [IU] via SUBCUTANEOUS
  Administered 2020-11-07: 3 [IU] via SUBCUTANEOUS
  Administered 2020-11-08 (×5): 2 [IU] via SUBCUTANEOUS
  Administered 2020-11-09: 3 [IU] via SUBCUTANEOUS
  Administered 2020-11-09: 2 [IU] via SUBCUTANEOUS
  Administered 2020-11-09: 5 [IU] via SUBCUTANEOUS
  Administered 2020-11-09 (×2): 3 [IU] via SUBCUTANEOUS
  Administered 2020-11-10 (×2): 2 [IU] via SUBCUTANEOUS
  Administered 2020-11-10: 3 [IU] via SUBCUTANEOUS
  Administered 2020-11-10 (×3): 2 [IU] via SUBCUTANEOUS
  Administered 2020-11-10 – 2020-11-11 (×2): 3 [IU] via SUBCUTANEOUS
  Administered 2020-11-11: 6 [IU] via SUBCUTANEOUS

## 2020-11-03 MED ORDER — MIDAZOLAM HCL 2 MG/2ML IJ SOLN
2.0000 mg | INTRAMUSCULAR | Status: DC | PRN
  Administered 2020-11-04 – 2020-11-05 (×4): 2 mg via INTRAVENOUS
  Filled 2020-11-03 (×4): qty 2

## 2020-11-03 MED ORDER — FENTANYL 2500MCG IN NS 250ML (10MCG/ML) PREMIX INFUSION
0.0000 ug/h | INTRAVENOUS | Status: DC
Start: 2020-11-03 — End: 2020-11-07
  Administered 2020-11-05: 250 ug/h via INTRAVENOUS
  Administered 2020-11-05: 25 ug/h via INTRAVENOUS
  Filled 2020-11-03 (×3): qty 250

## 2020-11-03 MED ORDER — ACETAMINOPHEN 160 MG/5ML PO SOLN
650.0000 mg | Freq: Four times a day (QID) | ORAL | Status: DC | PRN
  Administered 2020-11-06: 650 mg
  Filled 2020-11-03: qty 20.3

## 2020-11-03 MED ORDER — MIDAZOLAM HCL 2 MG/2ML IJ SOLN
2.0000 mg | INTRAMUSCULAR | Status: AC | PRN
  Administered 2020-11-03 – 2020-11-04 (×3): 2 mg via INTRAVENOUS
  Filled 2020-11-03 (×3): qty 2

## 2020-11-03 MED ORDER — POLYETHYLENE GLYCOL 3350 17 G PO PACK
17.0000 g | PACK | Freq: Every day | ORAL | Status: DC
  Administered 2020-11-03 – 2020-11-06 (×4): 17 g
  Filled 2020-11-03 (×4): qty 1

## 2020-11-03 MED ORDER — BISACODYL 10 MG RE SUPP
10.0000 mg | Freq: Every day | RECTAL | Status: DC | PRN
  Administered 2020-11-05: 10 mg via RECTAL
  Filled 2020-11-03: qty 1

## 2020-11-03 MED ORDER — FENTANYL CITRATE (PF) 100 MCG/2ML IJ SOLN: 50.0000 ug | Freq: Once | INTRAMUSCULAR | Status: DC

## 2020-11-03 MED ORDER — DOCUSATE SODIUM 50 MG/5ML PO LIQD
100.0000 mg | Freq: Two times a day (BID) | ORAL | Status: DC
  Administered 2020-11-03 – 2020-11-06 (×7): 100 mg
  Filled 2020-11-03 (×9): qty 10

## 2020-11-03 MED ORDER — FUROSEMIDE 10 MG/ML IJ SOLN
40.0000 mg | Freq: Four times a day (QID) | INTRAMUSCULAR | Status: AC
  Administered 2020-11-03 (×2): 40 mg via INTRAVENOUS
  Filled 2020-11-03 (×2): qty 4

## 2020-11-03 MED ORDER — DIPHENHYDRAMINE HCL 50 MG/ML IJ SOLN
12.5000 mg | Freq: Four times a day (QID) | INTRAMUSCULAR | Status: DC | PRN
Start: 2020-11-03 — End: 2020-11-14
  Administered 2020-11-13 – 2020-11-14 (×4): 25 mg via INTRAVENOUS
  Filled 2020-11-03 (×4): qty 1

## 2020-11-03 MED ORDER — FENTANYL BOLUS VIA INFUSION
50.0000 ug | INTRAVENOUS | Status: DC | PRN
  Administered 2020-11-05 (×4): 50 ug via INTRAVENOUS
  Administered 2020-11-05 (×2): 100 ug via INTRAVENOUS
  Administered 2020-11-05 (×2): 50 ug via INTRAVENOUS
  Administered 2020-11-05: 100 ug via INTRAVENOUS
  Filled 2020-11-03: qty 100

## 2020-11-03 NOTE — Progress Notes (Signed)
Progress Note  9 Days Post-Op  Subjective: Had a desat event this morning.  Working on weaning sedation.  Objective: Vital signs in last 24 hours: Temp:  [97.4 F (36.3 C)-98.9 F (37.2 C)] 97.9 F (36.6 C) (07/07 0733) Pulse Rate:  [56-94] 56 (07/07 0800) Resp:  [16-32] 32 (07/07 0800) BP: (138-183)/(45-64) 140/53 (07/07 0800) SpO2:  [91 %-100 %] 95 % (07/07 0847) Arterial Line BP: (99-167)/(30-60) 133/46 (07/07 0800) FiO2 (%):  [50 %-90 %] 50 % (07/07 0847) Weight:  [103.2 kg] 103.2 kg (07/07 0600) Last BM Date: 10/31/20  Intake/Output from previous day: 07/06 0701 - 07/07 0700 In: 1849.2 [I.V.:929.4; NG/GT:500; IV Piggyback:419.8] Out: 2245 [Urine:1015; Emesis/NG output:1230] Intake/Output this shift: Total I/O In: -  Out: 660 [Urine:160; Emesis/NG output:500]  PE: General: intubated, sedated.  Heart: regular, rate, and rhythm.   Resp: on vent, coarse breath sounds.   GI - soft, non distended, cannot assess tenderness Neuro- myoclonic jerks  Lab Results:  Recent Labs    11/02/20 0428 11/03/20 0421  WBC 33.8* 23.5*  HGB 8.7* 7.4*  HCT 27.9* 24.5*  PLT 218 168   BMET Recent Labs    11/02/20 0428 11/03/20 0421  NA 131* 133*  K 3.7 4.2  CL 99 104  CO2 21* 20*  GLUCOSE 164* 259*  BUN 17 20  CREATININE 1.30* 1.27*  CALCIUM 8.2* 8.4*   PT/INR No results for input(s): LABPROT, INR in the last 72 hours. CMP     Component Value Date/Time   NA 133 (L) 11/03/2020 0421   K 4.2 11/03/2020 0421   CL 104 11/03/2020 0421   CO2 20 (L) 11/03/2020 0421   GLUCOSE 259 (H) 11/03/2020 0421   BUN 20 11/03/2020 0421   CREATININE 1.27 (H) 11/03/2020 0421   CALCIUM 8.4 (L) 11/03/2020 0421   PROT 4.6 (L) 11/03/2020 0421   ALBUMIN 1.6 (L) 11/03/2020 0421   AST 19 11/03/2020 0421   ALT 13 11/03/2020 0421   ALKPHOS 80 11/03/2020 0421   BILITOT 5.3 (H) 11/03/2020 0421   GFRNONAA 44 (L) 11/03/2020 0421   GFRAA 58 (L) 02/08/2017 1025   Lipase     Component  Value Date/Time   LIPASE 29 07/17/2007 2035       Studies/Results: Portable Chest xray  Result Date: 11/02/2020 CLINICAL DATA:  Intubation.  Respiratory failure. EXAM: PORTABLE CHEST 1 VIEW COMPARISON:  11/01/2020. FINDINGS: Endotracheal tube, NG tube, left IJ line in stable position. Stable cardiomegaly. Prominent dense bilateral pulmonary infiltrates/edema again noted without interim change. Left pleural effusion cannot be excluded. No pneumothorax. IMPRESSION: 1.  Lines and tubes stable position 2.  Stable cardiomegaly. 3. Prominent dense bilateral pulmonary infiltrates/edema again noted without interim change. Left pleural effusion cannot be excluded. Electronically Signed   By: Marcello Moores  Register   On: 11/02/2020 07:26   DG CHEST PORT 1 VIEW  Result Date: 11/01/2020 CLINICAL DATA:  Left central line placement EXAM: PORTABLE CHEST 1 VIEW COMPARISON:  11/01/2020, 7:46 a.m. FINDINGS: Interval placement of left neck vascular catheter, tip projecting near the superior cavoatrial junction. Remaining support apparatus including endotracheal tube and esophagogastric tube are unchanged. Cardiomegaly with unchanged diffuse bilateral interstitial and heterogeneous airspace opacity. IMPRESSION: 1. Interval placement of left neck vascular catheter, tip projecting near the superior cavoatrial junction. Otherwise unchanged support apparatus. 2. Cardiomegaly with unchanged diffuse bilateral interstitial and heterogeneous airspace opacity. Electronically Signed   By: Eddie Candle M.D.   On: 11/01/2020 10:11   DG Abd Portable 1V  Result Date: 11/01/2020 CLINICAL DATA:  Feeding tube placement EXAM: PORTABLE ABDOMEN - 1 VIEW COMPARISON:  Radiograph 11/01/2020, UGI 10/27/2020 FINDINGS: NG tube tip projects over left upper quadrant. Feeding tube tip over the mid abdomen, small amount of enteral contrast opacifies jejunum. The patient is status post gastrectomy with gastrojejunostomy. The abdomen is relatively gasless.  IMPRESSION: 1. Feeding tube tip within the jejunum. Status post gastrectomy with gastrojejunostomy 2. NG tube tip overlies the gastric remnant region. Electronically Signed   By: Donavan Foil M.D.   On: 11/01/2020 16:55   DG Abd Portable 1V  Result Date: 11/01/2020 CLINICAL DATA:  Feeding tube placement EXAM: PORTABLE ABDOMEN - 1 VIEW COMPARISON:  November 01, 2020. FINDINGS: A gastric tube terminates in the proximal stomach, side port below the expected location of the GE junction. A feeding tube is present, tip likely within the loop jejunostomy based on course and the appearance of the previous upper GI evaluation. EKG leads project over the abdomen with pacer defibrillator pads in place as well Basilar airspace disease partially visualized at the lung bases. On limited assessment no acute regional skeletal process. IMPRESSION: 1. A gastric tube terminates in the proximal stomach, side port below the expected location of the GE junction. 2. Feeding tube tip likely within the loop jejunostomy, beyond the stomach. Electronically Signed   By: Zetta Bills M.D.   On: 11/01/2020 14:45   EEG adult  Result Date: 11/02/2020 Lora Havens, MD     11/02/2020 11:43 AM Patient Name: NANCYJO GIVHAN MRN: [id number] Epilepsy Attending: Lora Havens Referring Physician/Provider: Dr. Simonne Maffucci Date: 11/02/2020 Duration: 23.15 minutes Patient history: 75 year old female status post cardiorespiratory arrest.  Noted to have right arm twitching today.  EEG evaluate for seizures. Level of alertness: comatose AEDs during EEG study: Keppra, Versed Technical aspects: This EEG study was done with scalp electrodes positioned according to the 10-20 International system of electrode placement. Electrical activity was acquired at a sampling rate of 500Hz  and reviewed with a high frequency filter of 70Hz  and a low frequency filter of 1Hz . EEG data were recorded continuously and digitally stored. Description: EEG showed near  continuous generalized 3 to 5 Hz theta-delta slowing admixed with intermittent generalized 15 to 18 Hz beta activity.  Hyperventilation and photic stimulation were not performed.   ABNORMALITY - Continuous slow, generalized IMPRESSION: This study is suggestive of severe diffuse encephalopathy, nonspecific etiology but could be related to sedation, toxic-metabolic etiology, anoxic/hypoxic brain injury. No seizures or epileptiform discharges were seen throughout the recording. Lora Havens   ECHOCARDIOGRAM COMPLETE  Result Date: 11/02/2020    ECHOCARDIOGRAM REPORT   Patient Name:   BRYLYNN HANSSEN Date of Exam: 11/02/2020 Medical Rec #:  [id number]            Height:       59.0 in Accession #:    [id number]           Weight:       226.6 lb Date of Birth:  08-16-1945            BSA:          1.945 m Patient Age:    19 years             BP:           136/43 mmHg Patient Gender: F                    HR:  75 bpm. Exam Location:  Inpatient Procedure: 2D Echo, Cardiac Doppler, Color Doppler and Intracardiac            Opacification Agent Indications:    Acute ischemic heart disease, unspecified I24.9  History:        Patient has prior history of Echocardiogram examinations, most                 recent 08/18/2020. CAD; Risk Factors:Diabetes, Hypertension and                 Sleep Apnea.  Sonographer:    Darlina Sicilian RDCS Referring Phys: Lynn  Sonographer Comments: Echo performed with patient supine and on artificial respirator. IMPRESSIONS  1. Left ventricular ejection fraction, by estimation, is 65 to 70%. The left ventricle has normal function. The left ventricle has no regional wall motion abnormalities. Left ventricular diastolic parameters are consistent with Grade II diastolic dysfunction (pseudonormalization).  2. Right ventricular systolic function was not well visualized. The right ventricular size is not well visualized. There is moderately elevated pulmonary artery systolic  pressure. The estimated right ventricular systolic pressure is 82.4 mmHg.  3. The mitral valve is grossly normal. Trivial mitral valve regurgitation. No evidence of mitral stenosis.  4. The aortic valve is tricuspid. There is mild calcification of the aortic valve. Aortic valve regurgitation is trivial. Mild aortic valve sclerosis is present, with no evidence of aortic valve stenosis. Comparison(s): No significant change from prior study. FINDINGS  Left Ventricle: Left ventricular ejection fraction, by estimation, is 65 to 70%. The left ventricle has normal function. The left ventricle has no regional wall motion abnormalities. Definity contrast agent was given IV to delineate the left ventricular  endocardial borders. The left ventricular internal cavity size was normal in size. There is no left ventricular hypertrophy. Left ventricular diastolic parameters are consistent with Grade II diastolic dysfunction (pseudonormalization). Right Ventricle: The right ventricular size is not well visualized. Right vetricular wall thickness was not well visualized. Right ventricular systolic function was not well visualized. There is moderately elevated pulmonary artery systolic pressure. The  tricuspid regurgitant velocity is 3.54 m/s, and with an assumed right atrial pressure of 8 mmHg, the estimated right ventricular systolic pressure is 23.5 mmHg. Left Atrium: Left atrial size was normal in size. Right Atrium: Right atrial size was not well visualized. Pericardium: There is no evidence of pericardial effusion. Presence of pericardial fat pad. Mitral Valve: The mitral valve is grossly normal. Trivial mitral valve regurgitation. No evidence of mitral valve stenosis. Tricuspid Valve: The tricuspid valve is normal in structure. Tricuspid valve regurgitation is trivial. No evidence of tricuspid stenosis. Aortic Valve: The aortic valve is tricuspid. There is mild calcification of the aortic valve. Aortic valve regurgitation is  trivial. Mild aortic valve sclerosis is present, with no evidence of aortic valve stenosis. Pulmonic Valve: The pulmonic valve was not well visualized. Pulmonic valve regurgitation is trivial. No evidence of pulmonic stenosis. Aorta: The aortic root and ascending aorta are structurally normal, with no evidence of dilitation. Venous: IVC assessment for right atrial pressure unable to be performed due to mechanical ventilation. IAS/Shunts: The interatrial septum was not well visualized.  LEFT VENTRICLE PLAX 2D LVIDd:         4.40 cm     Diastology LVIDs:         2.40 cm     LV e' medial:    6.65 cm/s LV PW:  0.90 cm     LV E/e' medial:  13.9 LV IVS:        1.00 cm     LV e' lateral:   4.87 cm/s LVOT diam:     1.70 cm     LV E/e' lateral: 19.0 LV SV:         37 LV SV Index:   19 LVOT Area:     2.27 cm  LV Volumes (MOD) LV vol d, MOD A2C: 72.7 ml LV vol d, MOD A4C: 81.3 ml LV vol s, MOD A2C: 29.8 ml LV vol s, MOD A4C: 15.8 ml LV SV MOD A2C:     42.9 ml LV SV MOD A4C:     81.3 ml LV SV MOD BP:      55.4 ml LEFT ATRIUM           Index LA diam:      3.30 cm 1.70 cm/m LA Vol (A4C): 54.5 ml 28.02 ml/m  AORTIC VALVE LVOT Vmax:   86.70 cm/s LVOT Vmean:  51.500 cm/s LVOT VTI:    0.163 m  AORTA Ao Root diam: 2.80 cm Ao Asc diam:  3.10 cm MITRAL VALVE               TRICUSPID VALVE MV Area (PHT): 4.04 cm    TR Peak grad:   50.1 mmHg MV Decel Time: 188 msec    TR Vmax:        354.00 cm/s MV E velocity: 92.50 cm/s MV A velocity: 93.90 cm/s  SHUNTS MV E/A ratio:  0.99        Systemic VTI:  0.16 m                            Systemic Diam: 1.70 cm Buford Dresser MD Electronically signed by Buford Dresser MD Signature Date/Time: 11/02/2020/2:20:44 PM    Final     Anti-infectives: Anti-infectives (From admission, onward)    Start     Dose/Rate Route Frequency Ordered Stop   11/02/20 1000  cefTRIAXone (ROCEPHIN) 2 g in sodium chloride 0.9 % 100 mL IVPB  Status:  Discontinued        2 g 200 mL/hr over 30  Minutes Intravenous Every 24 hours 11/01/20 1257 11/02/20 0856   11/02/20 1000  doxycycline (VIBRAMYCIN) 100 mg in sodium chloride 0.9 % 250 mL IVPB  Status:  Discontinued        100 mg 125 mL/hr over 120 Minutes Intravenous Every 12 hours 11/01/20 1257 11/02/20 0856   11/02/20 1000  ceFEPIme (MAXIPIME) 2 g in sodium chloride 0.9 % 100 mL IVPB        2 g 200 mL/hr over 30 Minutes Intravenous Every 12 hours 11/02/20 0856     11/01/20 0915  levofloxacin (LEVAQUIN) IVPB 750 mg  Status:  Discontinued        750 mg 100 mL/hr over 90 Minutes Intravenous Every 24 hours 11/01/20 0827 11/01/20 1257   11/01/20 0730  levofloxacin (LEVAQUIN) IVPB 500 mg  Status:  Discontinued        500 mg 100 mL/hr over 60 Minutes Intravenous Every 24 hours 11/01/20 0631 11/01/20 1257   10/24/2020 2100  ciprofloxacin (CIPRO) IVPB 400 mg        400 mg 200 mL/hr over 60 Minutes Intravenous Every 12 hours 09/30/2020 1258 10/20/2020 2148   09/28/2020 0600  ciprofloxacin (CIPRO) IVPB 400 mg  400 mg 200 mL/hr over 60 Minutes Intravenous On call to O.R. 09/29/2020 0547 10/15/2020 0847        Assessment/Plan s/p Procedure(s): XI ROBOTIC ASSISTED DISTAL GASTRECTOMY (N/A)   Pathology from surgery 6/28, low grade dysplasia.  No cancer and 13 negative nodes.     CT got put on hold due to aspiration event.  Aspiration was witnessed and patient immediately got CPR and drugs with ROSC.   Shock - presumed septic shock. On cefepime. Cultures pending  Seems to be secondary to aspiration, but WBCs were elevated pre aspiration event.  Vasopressin weaning, levophed coming down. VDRF - vent settings improved.  Down to 0.5 FiO2 and 10 PEEP.  Rate was also able to be decreased.   CXR yesterday showed dense infiltrates.   Neuro- question of seizures based on myoclonic jerking.  EEG done, showed generalized slowing and no seizures.  CCM working on holding sedation to get better exam today.  Echo shows preserved ejection fraction and only  grade 2 diastolic dysfunction.  Moderately elevated PA pressures.    Presumed functional gastric outlet obstruction.  Upper gi earlier was negative for leak or for obstruction.  Keep NPO for now.  On trickle feeds today.  Coretrack is out of the stomach into the small intestine.  Will try ramping up slowly.         LOS: 9 days   Milus Height, MD FACS Surgical Oncology, General Surgery, Trauma and [facility], [place] for weekday/non holidays Check [email or web address] for coverage night/weekend/holidays  Do not use SecureChat as it is not reliable for timely patient care.

## 2020-11-03 NOTE — Progress Notes (Signed)
Inpatient Diabetes Program Recommendations  AACE/ADA: New Consensus Statement on Inpatient Glycemic Control (2015)  Target Ranges:  Prepandial:   less than 140 mg/dL      Peak postprandial:   less than 180 mg/dL (1-2 hours)      Critically ill patients:  140 - 180 mg/dL   Lab Results  Component Value Date   GLUCAP 235 (H) 11/03/2020   HGBA1C 6.1 (H) 10/19/2020    Review of Glycemic Control Results for [name], [name] (MRN [id number]) as of 11/03/2020 10:34  Ref. Range 11/02/2020 16:38 11/02/2020 19:53 11/02/2020 23:39 11/03/2020 03:52 11/03/2020 07:34  Glucose-Capillary Latest Ref Range: 70 - 99 mg/dL 228 (H) 228 (H) 230 (H) 233 (H) 235 (H)    Inpatient Diabetes Program Recommendations:    Please consider-  Novolog 0-15 units Q4H while intubated.  Will continue to follow while inpatient.  Thank you, Reche Dixon, RN, BSN Diabetes Coordinator Inpatient Diabetes Program [phone] (team pager from 8a-5p)

## 2020-11-03 NOTE — Progress Notes (Addendum)
Nutrition Follow Up  DOCUMENTATION CODES:   Not applicable  INTERVENTION:   Tube feeding:  -Vital 1.5 @ 30 ml/hr via post pyloric Cortrak -Advance per surgery to goal rate of 50 ml/hr (1200 ml) -ProSource TF 45 ml BID  At goal rate TF provides: 1880 kcals, 103 grams protein, 917 ml free water.   NUTRITION DIAGNOSIS:   Increased nutrient needs related to post-op healing as evidenced by estimated needs.  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 Presents this admission for elective robotic distal gastrectomy.  6/28- s/p robotic distal gastrectomy 7/05- PEA arrest, intubated, post pyloric Cortrak placed, trickles started   Weaning both pressors. Continues with intermittent myoclonic jerking. Plan to hold all sedation tomorrow to observe neuro status. Vital 1.5 running at 30 ml/hr without complication. Per surgery plan to increase to 40 ml/hr later in the day. Monitor ltyes as patient is at risk for refeeding. Has not had BM in 3-4 days. Scheduled bowel regimen added, has PRN suppository if no result in near future.   Admission weight: 102.1 kg  Current weight: 103.2 kg   Patient remains intubated on ventilator support MV: 8.1 L/min Temp (24hrs), Avg:98.3 F (36.8 C), Min:97.7 F (36.5 C), Max:98.9 F (37.2 C)   UOP: 1015 ml x 24 hrs  OG: 1230 ml x 24 hrs   Drips: levophed, vasopressin Medications: colace, 40 mg lasix BID, SS novolog, miralax Labs: Na 133 (L) Mg 2.6 (H) CBG [id number]  Diet Order:   Diet Order             Diet NPO time specified  Diet effective now                   EDUCATION NEEDS:   Not appropriate for education at  this time  Skin:  Skin Assessment: Skin Integrity Issues: Skin Integrity Issues:: Incisions, Stage II Stage II: sacrum Incisions: abdomen  Last BM:  7/4  Height:   Ht Readings from Last 1 Encounters:  10/13/2020 4\' 11"  (1.499 m)    Weight:   Wt Readings from Last 1 Encounters:  11/03/20 103.2 kg    BMI:  Body mass index is 45.95 kg/m.  Estimated Nutritional Needs:   Kcal:  1700-1900 kcal  Protein:  95-115 grams  Fluid:  >/= 1.7 L/day  Mariana Single MS, RD, LDN, CNSC Clinical Nutrition Pager listed in [place]

## 2020-11-03 NOTE — Plan of Care (Signed)
  Problem: Clinical Measurements: Goal: Diagnostic test results will improve Outcome: Progressing Goal: Respiratory complications will improve Outcome: Progressing Goal: Cardiovascular complication will be avoided Outcome: Progressing   Problem: Nutrition: Goal: Adequate nutrition will be maintained Outcome: Progressing   Problem: Skin Integrity: Goal: Risk for impaired skin integrity will decrease Outcome: Progressing   Problem: Activity: Goal: Ability to tolerate increased activity will improve Outcome: Progressing

## 2020-11-03 NOTE — Progress Notes (Signed)
NAME:  [name], MRN:  [id number], DOB:  1945-06-05, LOS: 9 ADMISSION DATE:  10/24/2020, CONSULTATION DATE:  7/5 REFERRING MD:  Barry Dienes, CHIEF COMPLAINT:  Cardiac arrest   History of Present Illness:  [name] is a 74 y.o. 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 She had procedure done 6/28.  Pathology was negative for malignancy but showed LGD with negative nodes.   Early AM 7/5, she had wheezing so received a breathing treatment.  Shortly thereafter, she was found with emesis all over bed and floor.  She had gurgling sounds and altered LOC.  She ended up having loss of pulses with initial rhythm asystole.  She received 1mg  epinephrine before PEA for which she received a 2nd dose of epinephrine.  She then had ROSC after 6 minutes.  She was intubated during the code.   She was transferred to [facility] ICU where PCCM was asked to assist with vent management.  Pertinent  Medical History  OSA DM2 Hypertension CAD Allergic rhinitis   Significant Hospital Events: Including procedures, antibiotic start and stop dates in addition to other pertinent events   6/28 > admit, robotic distal gastrectomy,  7/5 > 6 minute code (asystole then PEA), severe hypoxemic respiratory failure and shock 7/5 levaquin x1 7/5 ceftriaxone > 7/6 7/5 doxycycline > 7/6 7/6 cefepime >  7/6 EEG > generalized slowing suggestive of severe diffuse encephalopathy non-specific in etiology  7/6 TTE > LVEF 65-70%, grade II diastolic dysfunction, RSVP elevated 58.1 mmHg, trivial MR  Interim History / Subjective:  Has intermittent myoclonic jerking when stimulated Swelling some Tolerating tube feeding Oxygenation improving Nearly off vasopressors Making urine  Objective   Blood pressure (!) 152/51, pulse (!) 56, temperature 97.9 F (36.6 C), temperature source  Oral, resp. rate (!) 32, height 4\' 11"  (1.499 m), weight 103.2 kg, SpO2 98 %. CVP:  [2 mmHg-18 mmHg] 11 mmHg  Vent Mode: PRVC FiO2 (%):  [60 %-90 %] 60 % Set Rate:  [32 bmp] 32 bmp Vt Set:  [360 mL] 360 mL PEEP:  [10 cmH20-14 cmH20] 10 cmH20 Plateau Pressure:  [22 cmH20-28 cmH20] 22 cmH20   Intake/Output Summary (Last 24 hours) at 11/03/2020 0824 Last data filed at 11/03/2020 0800 Gross per 24 hour  Intake 1685.96 ml  Output 1625 ml  Net 60.96 ml   Filed Weights   11/01/20 0600 11/02/20 0400 11/03/20 0600  Weight: 102.1 kg 102.8 kg 103.2 kg    Examination:  General:  In bed on vent HENT: NCAT ETT in place PULM: CTA B, vent supported breathing CV: RRR, no mgr GI: BS present, midline dressing c/d/i MSK: normal bulk and tone Neuro: sedated on vent   ABG    Component Value Date/Time   PHART 7.202 (L) 11/01/2020 1005   PCO2ART 56.5 (H) 11/01/2020 1005   PO2ART 56 (L) 11/01/2020 1005   HCO3 22.2 11/01/2020 1005   TCO2 24 11/01/2020 1005   ACIDBASEDEF 6.0 (H) 11/01/2020 1005   O2SAT 69.5 11/02/2020 0906   7/5 resp culture > GPC, mod Gram pos rods   Resolved Hospital Problem list     Assessment & Plan:  Septic shock from aspiration pneumonia, improved parameters today TTE OK Monitor culture result Continue cefepime Stop vasopressin Stop stress dose steroids  Anasarca due to fluid administration Stop LR Lasix today  Severe acute respiratory failure with hypoxemia> improving significantly Full mechanical  vent support > decrease RR to 24 for comfort Wean down PEEP/FiO2 for SaO2 > 88% VAP prevention Daily WUA/SBT  Lactic acidosis : improving Continue supportive care/sepssi management as above  AKI Monitor BMET and UOP Replace electrolytes as needed  At risk for anoxic brain injury after cardiac arrest Abnormal motor movement on 7/6> myoclonus? Seizure disorder, EEG doesn't show seizures on 7/6 but consistent with encephalopathy, could be sedation related  but concerned she may have brain injury Continue Keppra Decrease sedation today, see below Exam today after sedation held  Neuroprognostication plan: hold sedation fully on 7/8, if not waking up then MRI brain   Need for sedation for mechanical ventilation Severe vent dyssynchrony  Change RASS goal to -1 Change versed to prn Continue fentanyl infusion  Ileus post partial gastrectomy for malignant polyp Tube feeding Care per primary service  DM2 SSI, monitor accucheck q4h  Best Practice (right click and "Reselect all SmartList Selections" daily)   Diet/type: tubefeeds DVT prophylaxis: LMWH GI prophylaxis: PPI Lines: Central line and yes and it is still needed Foley:  N/A Code Status:  full code Last date of multidisciplinary goals of care discussion [hasn't happened]  Labs   CBC: Recent Labs  Lab 10/30/20 0048 10/31/20 0042 11/01/20 0600 11/01/20 0604 11/01/20 0801 11/01/20 1005 11/02/20 0428 11/03/20 0421  WBC 21.2* 19.2*  --  33.2*  --   --  33.8* 23.5*  NEUTROABS  --   --   --   --   --   --  31.8* 19.9*  HGB 9.6* 9.1*   < > 9.3* 9.9* 9.9* 8.7* 7.4*  HCT 30.2* 29.2*   < > 29.4* 29.0* 29.0* 27.9* 24.5*  MCV 92.4 93.3  --  93.6  --   --  93.3 95.3  PLT 233 246  --  257  --   --  218 168   < > = values in this interval not displayed.    Basic Metabolic Panel: Recent Labs  Lab 10/30/20 0048 10/31/20 0042 11/01/20 0212 11/01/20 0600 11/01/20 0604 11/01/20 0801 11/01/20 1005 11/02/20 0428 11/02/20 2000 11/03/20 0421  NA 134* 132*  --    < > 132* 133* 133* 131*  --  133*  K 4.6 4.6  --    < > 3.8 3.9 3.5 3.7  --  4.2  CL 103 105  --   --  100  --   --  99  --  104  CO2 23 20*  --   --  20*  --   --  21*  --  20*  GLUCOSE 174* 146*  --   --  212*  --   --  164*  --  259*  BUN 23 19  --   --  18  --   --  17  --  20  CREATININE 1.17* 1.05* 1.15*  --  1.28*  --   --  1.30*  --  1.27*  CALCIUM 8.5* 8.5*  --   --  8.3*  --   --  8.2*  --  8.4*  MG  --   --    --   --   --   --   --  1.2* 2.6*  --   PHOS  --   --   --   --   --   --   --  3.8  --   --    < > = values in this interval not  displayed.   GFR: Estimated Creatinine Clearance: 39.4 mL/min (A) (by C-G formula based on SCr of 1.27 mg/dL (H)). Recent Labs  Lab 10/31/20 0042 11/01/20 0604 11/01/20 0950 11/02/20 0428 11/02/20 0910 11/02/20 1952 11/03/20 0421  WBC 19.2* 33.2*  --  33.8*  --   --  23.5*  LATICACIDVEN  --  4.8* 3.7*  --  3.4* 2.1*  --     Liver Function Tests: Recent Labs  Lab 11/02/20 0428 11/03/20 0421  AST 23 19  ALT 17 13  ALKPHOS 57 80  BILITOT 7.0* 5.3*  PROT 4.9* 4.6*  ALBUMIN 1.8* 1.6*   No results for input(s): LIPASE, AMYLASE in the last 168 hours. No results for input(s): AMMONIA in the last 168 hours.  ABG    Component Value Date/Time   PHART 7.202 (L) 11/01/2020 1005   PCO2ART 56.5 (H) 11/01/2020 1005   PO2ART 56 (L) 11/01/2020 1005   HCO3 22.2 11/01/2020 1005   TCO2 24 11/01/2020 1005   ACIDBASEDEF 6.0 (H) 11/01/2020 1005   O2SAT 69.5 11/02/2020 0906     Coagulation Profile: No results for input(s): INR, PROTIME in the last 168 hours.  Cardiac Enzymes: No results for input(s): CKTOTAL, CKMB, CKMBINDEX, TROPONINI in the last 168 hours.  HbA1C: Hgb A1c MFr Bld  Date/Time Value Ref Range Status  10/19/2020 11:50 AM 6.1 (H) 4.8 - 5.6 % Final    Comment:    (NOTE) Pre diabetes:          5.7%-6.4%  Diabetes:              >6.4%  Glycemic control for   <7.0% adults with diabetes   10/21/2013 03:57 PM 6.4 (H) <5.7 % Final    Comment:    (NOTE)                                                                       According to the ADA Clinical Practice Recommendations for 2011, when HbA1c is used as a screening test:  >=6.5%   Diagnostic of Diabetes Mellitus           (if abnormal result is confirmed) 5.7-6.4%   Increased risk of developing Diabetes Mellitus References:Diagnosis and Classification of Diabetes  [place],Diabetes SAYT,0160,10(XNATF 1):S62-S69 and Standards of Medical Care in         Diabetes - 2011,Diabetes TDDU,2025,42 (Suppl 1):S11-S61.    CBG: Recent Labs  Lab 11/02/20 1638 11/02/20 1953 11/02/20 2339 11/03/20 0352 11/03/20 0734  GLUCAP 228* 228* 230* 233* 235*    Critical care time: 40 minutes    Roselie Awkward, MD [place] [facility] Pager: [phone] Cell: [phone] If no response, please call [phone] until 7pm After 7:00 pm call Elink  [phone]

## 2020-11-04 ENCOUNTER — Inpatient Hospital Stay (HOSPITAL_COMMUNITY): Payer: Medicare PPO

## 2020-11-04 DIAGNOSIS — L8990 Pressure ulcer of unspecified site, unspecified stage: Secondary | ICD-10-CM | POA: Insufficient documentation

## 2020-11-04 DIAGNOSIS — I469 Cardiac arrest, cause unspecified: Secondary | ICD-10-CM | POA: Diagnosis not present

## 2020-11-04 LAB — AMMONIA: Ammonia: 25 umol/L (ref 9–35)

## 2020-11-04 LAB — CBC WITH DIFFERENTIAL/PLATELET
Abs Immature Granulocytes: 2.06 10*3/uL — ABNORMAL HIGH (ref 0.00–0.07)
Basophils Absolute: 0.1 10*3/uL (ref 0.0–0.1)
Basophils Relative: 0 %
Eosinophils Absolute: 0.2 10*3/uL (ref 0.0–0.5)
Eosinophils Relative: 1 %
HCT: 24.8 % — ABNORMAL LOW (ref 36.0–46.0)
Hemoglobin: 7.8 g/dL — ABNORMAL LOW (ref 12.0–15.0)
Immature Granulocytes: 10 %
Lymphocytes Relative: 9 %
Lymphs Abs: 1.9 10*3/uL (ref 0.7–4.0)
MCH: 29.1 pg (ref 26.0–34.0)
MCHC: 31.5 g/dL (ref 30.0–36.0)
MCV: 92.5 fL (ref 80.0–100.0)
Monocytes Absolute: 0.7 10*3/uL (ref 0.1–1.0)
Monocytes Relative: 3 %
Neutro Abs: 16.2 10*3/uL — ABNORMAL HIGH (ref 1.7–7.7)
Neutrophils Relative %: 77 %
Platelets: 172 10*3/uL (ref 150–400)
RBC: 2.68 MIL/uL — ABNORMAL LOW (ref 3.87–5.11)
RDW: 15.9 % — ABNORMAL HIGH (ref 11.5–15.5)
WBC: 21 10*3/uL — ABNORMAL HIGH (ref 4.0–10.5)
nRBC: 0.5 % — ABNORMAL HIGH (ref 0.0–0.2)

## 2020-11-04 LAB — MAGNESIUM: Magnesium: 1.9 mg/dL (ref 1.7–2.4)

## 2020-11-04 LAB — BASIC METABOLIC PANEL
Anion gap: 7 (ref 5–15)
BUN: 22 mg/dL (ref 8–23)
CO2: 31 mmol/L (ref 22–32)
Calcium: 8.6 mg/dL — ABNORMAL LOW (ref 8.9–10.3)
Chloride: 102 mmol/L (ref 98–111)
Creatinine, Ser: 1.35 mg/dL — ABNORMAL HIGH (ref 0.44–1.00)
GFR, Estimated: 40 mL/min — ABNORMAL LOW (ref >60)
Glucose, Bld: 138 mg/dL — ABNORMAL HIGH (ref 70–99)
Potassium: 3.3 mmol/L — ABNORMAL LOW (ref 3.5–5.1)
Sodium: 140 mmol/L (ref 135–145)

## 2020-11-04 LAB — COMPREHENSIVE METABOLIC PANEL
ALT: 13 U/L (ref 0–44)
AST: 17 U/L (ref 15–41)
Albumin: 1.5 g/dL — ABNORMAL LOW (ref 3.5–5.0)
Alkaline Phosphatase: 70 U/L (ref 38–126)
Anion gap: 4 — ABNORMAL LOW (ref 5–15)
BUN: 23 mg/dL (ref 8–23)
CO2: 28 mmol/L (ref 22–32)
Calcium: 8.4 mg/dL — ABNORMAL LOW (ref 8.9–10.3)
Chloride: 106 mmol/L (ref 98–111)
Creatinine, Ser: 1.3 mg/dL — ABNORMAL HIGH (ref 0.44–1.00)
GFR, Estimated: 42 mL/min — ABNORMAL LOW (ref >60)
Glucose, Bld: 138 mg/dL — ABNORMAL HIGH (ref 70–99)
Potassium: 3.2 mmol/L — ABNORMAL LOW (ref 3.5–5.1)
Sodium: 138 mmol/L (ref 135–145)
Total Bilirubin: 3.4 mg/dL — ABNORMAL HIGH (ref 0.3–1.2)
Total Protein: 4.5 g/dL — ABNORMAL LOW (ref 6.5–8.1)

## 2020-11-04 LAB — GLUCOSE, CAPILLARY
Glucose-Capillary: 107 mg/dL — ABNORMAL HIGH (ref 70–99)
Glucose-Capillary: 113 mg/dL — ABNORMAL HIGH (ref 70–99)
Glucose-Capillary: 149 mg/dL — ABNORMAL HIGH (ref 70–99)
Glucose-Capillary: 150 mg/dL — ABNORMAL HIGH (ref 70–99)
Glucose-Capillary: 125 mg/dL — ABNORMAL HIGH (ref 70–99)

## 2020-11-04 LAB — TSH: TSH: 5.572 u[IU]/mL — ABNORMAL HIGH (ref 0.350–4.500)

## 2020-11-04 MED ORDER — FUROSEMIDE 10 MG/ML IJ SOLN
40.0000 mg | Freq: Four times a day (QID) | INTRAMUSCULAR | Status: AC
  Administered 2020-11-04 (×2): 40 mg via INTRAVENOUS
  Filled 2020-11-04 (×2): qty 4

## 2020-11-04 MED ORDER — POTASSIUM CHLORIDE CRYS ER 20 MEQ PO TBCR
40.0000 meq | EXTENDED_RELEASE_TABLET | Freq: Once | ORAL | Status: AC
  Administered 2020-11-04: 40 meq via ORAL
  Filled 2020-11-04: qty 2

## 2020-11-04 MED ORDER — SODIUM CHLORIDE 0.9 % IV SOLN
2.0000 g | INTRAVENOUS | Status: AC
  Administered 2020-11-04 – 2020-11-06 (×3): 2 g via INTRAVENOUS
  Filled 2020-11-04: qty 20
  Filled 2020-11-04: qty 2
  Filled 2020-11-04: qty 20

## 2020-11-04 MED ORDER — METOCLOPRAMIDE HCL 5 MG/ML IJ SOLN
5.0000 mg | Freq: Three times a day (TID) | INTRAMUSCULAR | Status: DC
  Administered 2020-11-04 – 2020-11-06 (×6): 5 mg via INTRAVENOUS
  Filled 2020-11-04 (×6): qty 2

## 2020-11-04 MED ORDER — VITAL 1.5 CAL PO LIQD
1000.0000 mL | ORAL | Status: DC
  Administered 2020-11-04 – 2020-11-06 (×4): 1000 mL

## 2020-11-04 MED ORDER — POTASSIUM CHLORIDE 20 MEQ PO PACK
40.0000 meq | PACK | ORAL | Status: AC
  Administered 2020-11-04 (×2): 40 meq
  Filled 2020-11-04 (×2): qty 2

## 2020-11-04 MED ORDER — POTASSIUM CHLORIDE 20 MEQ PO PACK: 40.0000 meq | PACK | Freq: Two times a day (BID) | ORAL | Status: DC

## 2020-11-04 NOTE — Progress Notes (Signed)
NAME:  [name], MRN:  [id number], DOB:  06/18/1945, LOS: 78 ADMISSION DATE:  10/02/2020, CONSULTATION DATE:  7/5 REFERRING MD:  Barry Dienes, CHIEF COMPLAINT:  Cardiac arrest   History of Present Illness:  [name] is a 75 y.o. 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 She had procedure done 6/28.  Pathology was negative for malignancy but showed LGD with negative nodes.   Early AM 7/5, she had wheezing so received a breathing treatment.  Shortly thereafter, she was found with emesis all over bed and floor.  She had gurgling sounds and altered LOC.  She ended up having loss of pulses with initial rhythm asystole.  She received 1mg  epinephrine before PEA for which she received a 2nd dose of epinephrine.  She then had ROSC after 6 minutes.  She was intubated during the code.   She was transferred to [facility] ICU where PCCM was asked to assist with vent management.  Pertinent  Medical History  OSA DM2 Hypertension CAD Allergic rhinitis   Significant Hospital Events: Including procedures, antibiotic start and stop dates in addition to other pertinent events   6/28 > admit, robotic distal gastrectomy,  7/5 > 6 minute code (asystole then PEA), severe hypoxemic respiratory failure and shock 7/5 Tracheal aspirate: abundant PMN WBC; moderate gram (+) rods; few gram (+) cocci 7/5 levaquin x1 7/6 cefepime > 7/8 7/6 EEG > generalized slowing suggestive of severe diffuse encephalopathy non-specific in etiology  7/6 TTE > LVEF 65-70%, grade II diastolic dysfunction, RSVP elevated 58.1 mmHg, trivial MR 7/8 ceftriaxone >  Interim History / Subjective:  Has intermittent myoclonic jerking when stimulated Swelling some Tolerating tube feeding Oxygenation improving Nearly off vasopressors Making urine  Objective   Blood pressure (!) 116/41, pulse  95, temperature 97.8 F (36.6 C), temperature source Oral, resp. rate (!) 25, height 4\' 11"  (1.499 m), weight 100.1 kg, SpO2 94 %. CVP:  [5 mmHg-52 mmHg] 8 mmHg  Vent Mode: PRVC FiO2 (%):  [40 %-50 %] 50 % Set Rate:  [22 bmp] 22 bmp Vt Set:  [360 mL] 360 mL PEEP:  [5 cmH20-10 cmH20] 8 cmH20 Pressure Support:  [14 cmH20] 14 cmH20 Plateau Pressure:  [19 cmH20-22 cmH20] 21 cmH20   Intake/Output Summary (Last 24 hours) at 11/04/2020 1058 Last data filed at 11/04/2020 1040 Gross per 24 hour  Intake 1707.7 ml  Output 4530 ml  Net -2822.3 ml    Filed Weights   11/02/20 0400 11/03/20 0600 11/04/20 0519  Weight: 102.8 kg 103.2 kg 100.1 kg    Examination:  General:  In bed on vent HENT: NCAT ETT in place PULM: CTA B, vent supported breathing CV: RRR, no mgr GI: BS present, midline dressing c/d/i MSK: normal bulk and tone Neuro: sedated on vent   ABG    Component Value Date/Time   PHART 7.202 (L) 11/01/2020 1005   PCO2ART 56.5 (H) 11/01/2020 1005   PO2ART 56 (L) 11/01/2020 1005   HCO3 22.2 11/01/2020 1005   TCO2 24 11/01/2020 1005   ACIDBASEDEF 6.0 (H) 11/01/2020 1005   O2SAT 69.5 11/02/2020 0906   7/5 resp culture > GPC, mod Gram pos rods   Resolved Hospital Problem list     Assessment & Plan:  Septic shock from aspiration pneumonia, improved parameters today TTE OK Monitor culture result Discontinue cefepime; start ceftriaxone 2g q24h (day 4 of 7 of antibiotics) Stop  vasopressin Stop stress dose steroids Continue titrated norepi  Anasarca due to fluid administration Stop LR Lasix today  Severe acute respiratory failure with hypoxemia> improving significantly Full mechanical vent support > decrease RR to 24 for comfort Wean down PEEP/FiO2 for SaO2 > 88% (PEEP 8) VAP prevention Daily WUA/SBT  Lactic acidosis : improving Continue supportive care/sepssi management as above  AKI Monitor BMET and UOP Monitor Mg Replace electrolytes as needed  At risk for  anoxic brain injury after cardiac arrest Abnormal motor movement on 7/6> myoclonus? Seizure disorder, EEG doesn't show seizures on 7/6 but consistent with encephalopathy, could be sedation related but concerned she may have brain injury Continue Keppra 500 mg qd Decrease sedation today, see below Exam today after sedation held  Neuroprognostication plan: hold sedation fully on 7/8, if not waking up then MRI brain Check ammonia and TSH levels  Need for sedation for mechanical ventilation Severe vent dyssynchrony  Change RASS goal to -1 Change versed to prn Continue fentanyl infusion  Ileus post partial gastrectomy for malignant polyp Tube feeding Start metoclopramide 5 mg q8h Care per primary service  DM2 SSI, monitor accucheck q4h  Best Practice (right click and "Reselect all SmartList Selections" daily)   Diet/type: tubefeeds DVT prophylaxis: LMWH GI prophylaxis: PPI Lines: Central line and yes and it is still needed Foley:  N/A Code Status:  full code Last date of multidisciplinary goals of care discussion [hasn't happened]  Labs   CBC: Recent Labs  Lab 10/31/20 0042 11/01/20 0600 11/01/20 0604 11/01/20 0801 11/01/20 1005 11/02/20 0428 11/03/20 0421 11/04/20 0553  WBC 19.2*  --  33.2*  --   --  33.8* 23.5* 21.0*  NEUTROABS  --   --   --   --   --  31.8* 19.9* 16.2*  HGB 9.1*   < > 9.3* 9.9* 9.9* 8.7* 7.4* 7.8*  HCT 29.2*   < > 29.4* 29.0* 29.0* 27.9* 24.5* 24.8*  MCV 93.3  --  93.6  --   --  93.3 95.3 92.5  PLT 246  --  257  --   --  218 168 172   < > = values in this interval not displayed.     Basic Metabolic Panel: Recent Labs  Lab 10/31/20 0042 11/01/20 0212 11/01/20 0600 11/01/20 0604 11/01/20 0801 11/01/20 1005 11/02/20 0428 11/02/20 2000 11/03/20 0421 11/04/20 0929  NA 132*  --    < > 132* 133* 133* 131*  --  133* 138  K 4.6  --    < > 3.8 3.9 3.5 3.7  --  4.2 3.2*  CL 105  --   --  100  --   --  99  --  104 106  CO2 20*  --   --  20*   --   --  21*  --  20* 28  GLUCOSE 146*  --   --  212*  --   --  164*  --  259* 138*  BUN 19  --   --  18  --   --  17  --  20 23  CREATININE 1.05* 1.15*  --  1.28*  --   --  1.30*  --  1.27* 1.30*  CALCIUM 8.5*  --   --  8.3*  --   --  8.2*  --  8.4* 8.4*  MG  --   --   --   --   --   --  1.2* 2.6*  --  1.9  PHOS  --   --   --   --   --   --  3.8  --   --   --    < > = values in this interval not displayed.    GFR: Estimated Creatinine Clearance: 37.8 mL/min (A) (by C-G formula based on SCr of 1.3 mg/dL (H)). Recent Labs  Lab 11/01/20 0604 11/01/20 0950 11/02/20 0428 11/02/20 0910 11/02/20 1952 11/03/20 0421 11/04/20 0553  WBC 33.2*  --  33.8*  --   --  23.5* 21.0*  LATICACIDVEN 4.8* 3.7*  --  3.4* 2.1*  --   --      Liver Function Tests: Recent Labs  Lab 11/02/20 0428 11/03/20 0421 11/04/20 0929  AST 23 19 17   ALT 17 13 13   ALKPHOS 57 80 70  BILITOT 7.0* 5.3* 3.4*  PROT 4.9* 4.6* 4.5*  ALBUMIN 1.8* 1.6* 1.5*    No results for input(s): LIPASE, AMYLASE in the last 168 hours. No results for input(s): AMMONIA in the last 168 hours.  ABG    Component Value Date/Time   PHART 7.202 (L) 11/01/2020 1005   PCO2ART 56.5 (H) 11/01/2020 1005   PO2ART 56 (L) 11/01/2020 1005   HCO3 22.2 11/01/2020 1005   TCO2 24 11/01/2020 1005   ACIDBASEDEF 6.0 (H) 11/01/2020 1005   O2SAT 69.5 11/02/2020 0906      Coagulation Profile: No results for input(s): INR, PROTIME in the last 168 hours.  Cardiac Enzymes: No results for input(s): CKTOTAL, CKMB, CKMBINDEX, TROPONINI in the last 168 hours.  HbA1C: Hgb A1c MFr Bld  Date/Time Value Ref Range Status  10/19/2020 11:50 AM 6.1 (H) 4.8 - 5.6 % Final    Comment:    (NOTE) Pre diabetes:          5.7%-6.4%  Diabetes:              >6.4%  Glycemic control for   <7.0% adults with diabetes   10/21/2013 03:57 PM 6.4 (H) <5.7 % Final    Comment:    (NOTE)                                                                        According to the ADA Clinical Practice Recommendations for 2011, when HbA1c is used as a screening test:  >=6.5%   Diagnostic of Diabetes Mellitus           (if abnormal result is confirmed) 5.7-6.4%   Increased risk of developing Diabetes Mellitus References:Diagnosis and Classification of Diabetes Mellitus,Diabetes JXBJ,4782,95(AOZHY 1):S62-S69 and Standards of Medical Care in         Diabetes - 2011,Diabetes QMVH,8469,62 (Suppl 1):S11-S61.    CBG: Recent Labs  Lab 11/03/20 1935 11/03/20 2321 11/04/20 0337 11/04/20 0734 11/04/20 1056  GLUCAP 133* 102* 107* 113* 149*     Critical care time: 40 minutes    Roselie Awkward, MD [place] [facility] Pager: [phone] Cell: [phone] If no response, please call [phone] until 7pm After 7:00 pm call Elink  [phone]

## 2020-11-04 NOTE — Progress Notes (Signed)
NAME:  [name], MRN:  [id number], DOB:  10-08-1945, LOS: 63 ADMISSION DATE:  10/27/2020, CONSULTATION DATE:  7/5 REFERRING MD:  Barry Dienes, CHIEF COMPLAINT:  Cardiac arrest   History of Present Illness:  [name] is a 75 y.o. 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 She had procedure done 6/28.  Pathology was negative for malignancy but showed LGD with negative nodes.   Early AM 7/5, she had wheezing so received a breathing treatment.  Shortly thereafter, she was found with emesis all over bed and floor.  She had gurgling sounds and altered LOC.  She ended up having loss of pulses with initial rhythm asystole.  She received 1mg  epinephrine before PEA for which she received a 2nd dose of epinephrine.  She then had ROSC after 6 minutes.  She was intubated during the code.   She was transferred to [facility] ICU where PCCM was asked to assist with vent management.  Pertinent  Medical History  OSA DM2 Hypertension CAD Allergic rhinitis   Significant Hospital Events: Including procedures, antibiotic start and stop dates in addition to other pertinent events   6/28 > admit, robotic distal gastrectomy,  7/5 > 6 minute code (asystole then PEA), severe hypoxemic respiratory failure and shock 7/5 levaquin x1 7/5 ceftriaxone > 7/6 7/5 doxycycline > 7/6 7/6 cefepime >  7/6 EEG > generalized slowing suggestive of severe diffuse encephalopathy non-specific in etiology  7/6 TTE > LVEF 65-70%, grade II diastolic dysfunction, RSVP elevated 58.1 mmHg, trivial MR  Interim History / Subjective:  7/8: no acute events reported overnight. Wbc stable to improved, tmax 98.7 on cpap 14/5 this am for >2 hr but back to full support with some desat. Will get MRI once able to lay flat. ? Improvement in mental status vs involuntary movements that appear  periodically purposeful  Objective   Blood pressure (!) 147/38, pulse 81, temperature 98.1 F (36.7 C), temperature source Oral, resp. rate (!) 26, height 4\' 11"  (1.499 m), weight 100.1 kg, SpO2 94 %. CVP:  [5 mmHg-52 mmHg] 6 mmHg  Vent Mode: PSV;CPAP FiO2 (%):  [40 %-50 %] 40 % Set Rate:  [22 bmp] 22 bmp Vt Set:  [360 mL] 360 mL PEEP:  [5 cmH20-10 cmH20] 5 cmH20 Pressure Support:  [14 cmH20] 14 cmH20 Plateau Pressure:  [19 cmH20-22 cmH20] 21 cmH20   Intake/Output Summary (Last 24 hours) at 11/04/2020 0852 Last data filed at 11/04/2020 0800 Gross per 24 hour  Intake 1682.15 ml  Output 4220 ml  Net -2537.85 ml   Filed Weights   11/02/20 0400 11/03/20 0600 11/04/20 0519  Weight: 102.8 kg 103.2 kg 100.1 kg    Examination:  General:  In bed on vent lightly sedated on fentanyl HENT: NCAT ETT in place, mmmp PULM: rhonchi bilaterally CV: RRR, no mgr GI: BS present, + edema MSK: normal bulk and tone Neuro: sedated on vent, spontaneous jerking movements then occasional and timely movement of feet, eyelids and tongue ? Follopwing commands or coincidental as it is not able to be repeated   ABG    Component Value Date/Time   PHART 7.202 (L) 11/01/2020 1005   PCO2ART 56.5 (H) 11/01/2020 1005   PO2ART 56 (L) 11/01/2020 1005   HCO3 22.2 11/01/2020 1005   TCO2 24 11/01/2020 1005   ACIDBASEDEF 6.0 (H) 11/01/2020 1005   O2SAT 69.5 11/02/2020 0906   7/5 resp  culture > normal flora   Resolved Hospital Problem list     Assessment & Plan:  Septic shock from aspiration pneumonia, improved parameters today TTE OK -change cefepime to ceftriaxone in light of negative cx.  -norepi held 7/8 am at this time.  -monitor for stability   Anasarca due to fluid administration Cont lasix as tolerated if remains off pressors.  -bmp in am   Severe acute respiratory failure with hypoxemia> improving significantly Full mechanical vent support Wean down PEEP/FiO2 for SaO2 > 88% VAP  prevention Daily WUA/SBT  Lactic acidosis : improving Continue supportive care/sepssi management as above  AKI Monitor BMET and UOP Replace electrolytes as needed -bmp this am is pending.  -will push forward with diuresis today as off pressors.  -recheck renal function in am, uop good   At risk for anoxic brain injury after cardiac arrest Abnormal motor movement on 7/6> myoclonus? Seizure disorder, EEG doesn't show seizures on 7/6 but consistent with encephalopathy, could be sedation related but concerned she may have brain injury Continue Keppra Decrease sedation today, see below Exam today after sedation held  Neuroprognostication plan: hold sedation fully on 7/8, if not waking up then MRI brain when able to lay flat from oxygenation standpoint -for completeness sake will check ammonia, tsh (normal in 2015)  Need for sedation for mechanical ventilation Severe vent dyssynchrony  Change RASS goal to 0 Change versed to prn Continue fentanyl infusion but at lower max rate  Ileus post partial gastrectomy for malignant polyp Tube feeding Care per primary service -will start low dose reglan at this time.  -feedings tolerated via cortrak  DM2 SSI, monitor accucheck q4h  Best Practice (right click and "Reselect all SmartList Selections" daily)   Diet/type: tubefeeds DVT prophylaxis: LMWH GI prophylaxis: PPI Lines: Central line and yes and it is still needed Foley:  N/A Code Status:  full code Last date of multidisciplinary goals of care discussion [per primary]  Labs   CBC: Recent Labs  Lab 10/31/20 0042 11/01/20 0600 11/01/20 0604 11/01/20 0801 11/01/20 1005 11/02/20 0428 11/03/20 0421 11/04/20 0553  WBC 19.2*  --  33.2*  --   --  33.8* 23.5* 21.0*  NEUTROABS  --   --   --   --   --  31.8* 19.9* 16.2*  HGB 9.1*   < > 9.3* 9.9* 9.9* 8.7* 7.4* 7.8*  HCT 29.2*   < > 29.4* 29.0* 29.0* 27.9* 24.5* 24.8*  MCV 93.3  --  93.6  --   --  93.3 95.3 92.5  PLT 246  --   257  --   --  218 168 172   < > = values in this interval not displayed.    Basic Metabolic Panel: Recent Labs  Lab 10/30/20 0048 10/31/20 0042 11/01/20 0212 11/01/20 0600 11/01/20 0604 11/01/20 0801 11/01/20 1005 11/02/20 0428 11/02/20 2000 11/03/20 0421  NA 134* 132*  --    < > 132* 133* 133* 131*  --  133*  K 4.6 4.6  --    < > 3.8 3.9 3.5 3.7  --  4.2  CL 103 105  --   --  100  --   --  99  --  104  CO2 23 20*  --   --  20*  --   --  21*  --  20*  GLUCOSE 174* 146*  --   --  212*  --   --  164*  --  259*  BUN  23 19  --   --  18  --   --  17  --  20  CREATININE 1.17* 1.05* 1.15*  --  1.28*  --   --  1.30*  --  1.27*  CALCIUM 8.5* 8.5*  --   --  8.3*  --   --  8.2*  --  8.4*  MG  --   --   --   --   --   --   --  1.2* 2.6*  --   PHOS  --   --   --   --   --   --   --  3.8  --   --    < > = values in this interval not displayed.   GFR: Estimated Creatinine Clearance: 38.7 mL/min (A) (by C-G formula based on SCr of 1.27 mg/dL (H)). Recent Labs  Lab 11/01/20 0604 11/01/20 0950 11/02/20 0428 11/02/20 0910 11/02/20 1952 11/03/20 0421 11/04/20 0553  WBC 33.2*  --  33.8*  --   --  23.5* 21.0*  LATICACIDVEN 4.8* 3.7*  --  3.4* 2.1*  --   --     Liver Function Tests: Recent Labs  Lab 11/02/20 0428 11/03/20 0421  AST 23 19  ALT 17 13  ALKPHOS 57 80  BILITOT 7.0* 5.3*  PROT 4.9* 4.6*  ALBUMIN 1.8* 1.6*   No results for input(s): LIPASE, AMYLASE in the last 168 hours. No results for input(s): AMMONIA in the last 168 hours.  ABG    Component Value Date/Time   PHART 7.202 (L) 11/01/2020 1005   PCO2ART 56.5 (H) 11/01/2020 1005   PO2ART 56 (L) 11/01/2020 1005   HCO3 22.2 11/01/2020 1005   TCO2 24 11/01/2020 1005   ACIDBASEDEF 6.0 (H) 11/01/2020 1005   O2SAT 69.5 11/02/2020 0906     Coagulation Profile: No results for input(s): INR, PROTIME in the last 168 hours.  Cardiac Enzymes: No results for input(s): CKTOTAL, CKMB, CKMBINDEX, TROPONINI in the last 168  hours.  HbA1C: Hgb A1c MFr Bld  Date/Time Value Ref Range Status  10/19/2020 11:50 AM 6.1 (H) 4.8 - 5.6 % Final    Comment:    (NOTE) Pre diabetes:          5.7%-6.4%  Diabetes:              >6.4%  Glycemic control for   <7.0% adults with diabetes   10/21/2013 03:57 PM 6.4 (H) <5.7 % Final    Comment:    (NOTE)                                                                       According to the ADA Clinical Practice Recommendations for 2011, when HbA1c is used as a screening test:  >=6.5%   Diagnostic of Diabetes Mellitus           (if abnormal result is confirmed) 5.7-6.4%   Increased risk of developing Diabetes Mellitus References:Diagnosis and Classification of Diabetes Mellitus,Diabetes IEPP,2951,88(CZYSA 1):S62-S69 and Standards of Medical Care in         Diabetes - 2011,Diabetes YTKZ,6010,93 (Suppl 1):S11-S61.    CBG: Recent Labs  Lab 11/03/20 1607 11/03/20 1935 11/03/20 2321 11/04/20 2355 11/04/20 7322  [facility]    Critical care time: The patient is critically ill with multiple organ systems failure and requires high complexity decision making for assessment and support, frequent evaluation and titration of therapies, application of advanced monitoring technologies and extensive interpretation of multiple databases.  Critical care time 40 mins. This represents my time independent of the NPs time taking care of the pt. This is excluding procedures.    Audria Nine DO [facility] 11/04/2020, 8:52 AM See Amion for pager If no response to pager, please call [phone] until 1900 After 1900 please call [facility] [phone]

## 2020-11-04 NOTE — Progress Notes (Signed)
Progress Note  10 Days Post-Op  Subjective: No acute events. Vasopressin off.  Tolerating 40 ml/hr tube feeds.    Objective: Vital signs in last 24 hours: Temp:  [97.8 F (36.6 C)-98.7 F (37.1 C)] 97.8 F (36.6 C) (07/08 1055) Pulse Rate:  [56-96] 84 (07/08 1115) Resp:  [17-34] 27 (07/08 1115) BP: (93-171)/(35-71) 93/35 (07/08 1100) SpO2:  [86 %-99 %] 97 % (07/08 1115) Arterial Line BP: (114-180)/(31-50) 142/48 (07/08 1115) FiO2 (%):  [40 %-50 %] 50 % (07/08 1025) Weight:  [100.1 kg] 100.1 kg (07/08 0519) Last BM Date: 10/31/20  Intake/Output from previous day: 07/07 0701 - 07/08 0700 In: 1789.3 [I.V.:296; HY/QM:578.4; IV Piggyback:418.8] Out: 4680 [Urine:2630; Emesis/NG output:2050] Intake/Output this shift: Total I/O In: 474.2 [I.V.:0.8; NG/GT:311.7; IV Piggyback:161.7] Out: 780 [Urine:530; Emesis/NG output:250]  PE:  General: intubated, sedated.  Heart: regular, rate, and rhythm.   Resp: on vent, no distress GI - soft, non distended, non tender.  Neuro- no myoclonus today.    Lab Results:  Recent Labs    11/03/20 0421 11/04/20 0553  WBC 23.5* 21.0*  HGB 7.4* 7.8*  HCT 24.5* 24.8*  PLT 168 172   BMET Recent Labs    11/03/20 0421 11/04/20 0929  NA 133* 138  K 4.2 3.2*  CL 104 106  CO2 20* 28  GLUCOSE 259* 138*  BUN 20 23  CREATININE 1.27* 1.30*  CALCIUM 8.4* 8.4*   PT/INR No results for input(s): LABPROT, INR in the last 72 hours. CMP     Component Value Date/Time   NA 138 11/04/2020 0929   K 3.2 (L) 11/04/2020 0929   CL 106 11/04/2020 0929   CO2 28 11/04/2020 0929   GLUCOSE 138 (H) 11/04/2020 0929   BUN 23 11/04/2020 0929   CREATININE 1.30 (H) 11/04/2020 0929   CALCIUM 8.4 (L) 11/04/2020 0929   PROT 4.5 (L) 11/04/2020 0929   ALBUMIN 1.5 (L) 11/04/2020 0929   AST 17 11/04/2020 0929   ALT 13 11/04/2020 0929   ALKPHOS 70 11/04/2020 0929   BILITOT 3.4 (H) 11/04/2020 0929   GFRNONAA 42 (L) 11/04/2020 0929   GFRAA 58 (L) 02/08/2017  1025   Lipase     Component Value Date/Time   LIPASE 29 07/17/2007 2035       Studies/Results: DG Chest Port 1 View  Result Date: 11/04/2020 CLINICAL DATA:  Acute respiratory failure with hypoxemia. EXAM: PORTABLE CHEST 1 VIEW COMPARISON:  November 02, 2020. FINDINGS: Stable cardiomegaly. Endotracheal and nasogastric tubes are unchanged in position. Left internal jugular catheter is unchanged. No pneumothorax is noted. Bilateral lung opacities are again noted concerning for pneumonia or edema. Left pleural effusion may be present. Bony thorax is unremarkable. IMPRESSION: Stable support apparatus.  Stable bilateral lung opacities. Aortic Atherosclerosis (ICD10-I70.0). Electronically Signed   By: Marijo Conception M.D.   On: 11/04/2020 09:06   EEG adult  Result Date: 11/02/2020 Lora Havens, MD     11/02/2020 11:43 AM Patient Name: [name] MRN: [id number] Epilepsy Attending: Lora Havens Referring Physician/Provider: Dr. Simonne Maffucci Date: 11/02/2020 Duration: 23.15 minutes Patient history: 75 year old female status post cardiorespiratory arrest.  Noted to have right arm twitching today.  EEG evaluate for seizures. Level of alertness: comatose AEDs during EEG study: Keppra, Versed Technical aspects: This EEG study was done with scalp electrodes positioned according to the 10-20 International system of electrode placement. Electrical activity was acquired at a sampling rate of 500Hz  and reviewed with a high frequency filter  of 70Hz  and a low frequency filter of 1Hz . EEG data were recorded continuously and digitally stored. Description: EEG showed near continuous generalized 3 to 5 Hz theta-delta slowing admixed with intermittent generalized 15 to 18 Hz beta activity.  Hyperventilation and photic stimulation were not performed.   ABNORMALITY - Continuous slow, generalized IMPRESSION: This study is suggestive of severe diffuse encephalopathy, nonspecific etiology but could be related to  sedation, toxic-metabolic etiology, anoxic/hypoxic brain injury. No seizures or epileptiform discharges were seen throughout the recording. Priyanka [name]    Anti-infectives: Anti-infectives (From admission, onward)    Start     Dose/Rate Route Frequency Ordered Stop   11/02/20 1000  cefTRIAXone (ROCEPHIN) 2 g in sodium chloride 0.9 % 100 mL IVPB  Status:  Discontinued        2 g 200 mL/hr over 30 Minutes Intravenous Every 24 hours 11/01/20 1257 11/02/20 0856   11/02/20 1000  doxycycline (VIBRAMYCIN) 100 mg in sodium chloride 0.9 % 250 mL IVPB  Status:  Discontinued        100 mg 125 mL/hr over 120 Minutes Intravenous Every 12 hours 11/01/20 1257 11/02/20 0856   11/02/20 1000  ceFEPIme (MAXIPIME) 2 g in sodium chloride 0.9 % 100 mL IVPB        2 g 200 mL/hr over 30 Minutes Intravenous Every 12 hours 11/02/20 0856     11/01/20 0915  levofloxacin (LEVAQUIN) IVPB 750 mg  Status:  Discontinued        750 mg 100 mL/hr over 90 Minutes Intravenous Every 24 hours 11/01/20 0827 11/01/20 1257   11/01/20 0730  levofloxacin (LEVAQUIN) IVPB 500 mg  Status:  Discontinued        500 mg 100 mL/hr over 60 Minutes Intravenous Every 24 hours 11/01/20 0631 11/01/20 1257   10/24/2020 2100  ciprofloxacin (CIPRO) IVPB 400 mg        400 mg 200 mL/hr over 60 Minutes Intravenous Every 12 hours 10/26/2020 1258 10/04/2020 2148   09/30/2020 0600  ciprofloxacin (CIPRO) IVPB 400 mg        400 mg 200 mL/hr over 60 Minutes Intravenous On call to O.R. 09/28/2020 0547 10/20/2020 0847        Assessment/Plan s/p Procedure(s): XI ROBOTIC ASSISTED DISTAL GASTRECTOMY (N/A)   Pathology from surgery 6/28, low grade dysplasia.  No cancer and 13 negative nodes.     CT got put on hold due to aspiration event.  Aspiration was witnessed and patient immediately got CPR and drugs with ROSC.   Shock - presumed septic shock. On cefepime. Cultures pending  Seems to be secondary to aspiration, but WBCs were elevated pre aspiration event.   Down to one pressor. VDRF - vent settings improved.  Down to 0.5 FiO2 and 10 PEEP.   Neuro- question of seizures based on myoclonic jerking.  EEG done, showed generalized slowing and no seizures.   Echo shows preserved ejection fraction and only grade 2 diastolic dysfunction.  Moderately elevated PA pressures.    Presumed functional gastric outlet obstruction.  Upper gi earlier was negative for leak or for obstruction.  Keep NPO for now.  On trickle feeds today.  Coretrack is out of the stomach into the small intestine.  Will try ramping up slowly.  Advance to goal.        LOS: 10 days   Milus Height, MD FACS Surgical Oncology, General Surgery, Trauma and [place] [facility], [place] for weekday/non holidays Check [email or web address] for coverage night/weekend/holidays  Do not use SecureChat as it is not reliable for timely patient care.

## 2020-11-04 NOTE — Plan of Care (Signed)
  Problem: Clinical Measurements: Goal: Cardiovascular complication will be avoided Outcome: Progressing   Problem: Nutrition: Goal: Adequate nutrition will be maintained Outcome: Progressing   Problem: Pain Managment: Goal: General experience of comfort will improve Outcome: Progressing   Problem: Skin Integrity: Goal: Risk for impaired skin integrity will decrease Outcome: Progressing   Problem: Skin Integrity: Goal: Demonstration of wound healing without infection will improve Outcome: Progressing

## 2020-11-05 ENCOUNTER — Inpatient Hospital Stay (HOSPITAL_COMMUNITY): Payer: Medicare PPO

## 2020-11-05 DIAGNOSIS — I469 Cardiac arrest, cause unspecified: Secondary | ICD-10-CM | POA: Diagnosis not present

## 2020-11-05 LAB — CBC
HCT: 24.8 % — ABNORMAL LOW (ref 36.0–46.0)
Hemoglobin: 7.7 g/dL — ABNORMAL LOW (ref 12.0–15.0)
MCH: 29.1 pg (ref 26.0–34.0)
MCHC: 31 g/dL (ref 30.0–36.0)
MCV: 93.6 fL (ref 80.0–100.0)
Platelets: 170 10*3/uL (ref 150–400)
RBC: 2.65 MIL/uL — ABNORMAL LOW (ref 3.87–5.11)
RDW: 16.2 % — ABNORMAL HIGH (ref 11.5–15.5)
WBC: 18.6 10*3/uL — ABNORMAL HIGH (ref 4.0–10.5)
nRBC: 0.3 % — ABNORMAL HIGH (ref 0.0–0.2)

## 2020-11-05 LAB — BASIC METABOLIC PANEL
Anion gap: 8 (ref 5–15)
Anion gap: 8 (ref 5–15)
BUN: 23 mg/dL (ref 8–23)
BUN: 22 mg/dL (ref 8–23)
CO2: 31 mmol/L (ref 22–32)
CO2: 30 mmol/L (ref 22–32)
Calcium: 8.6 mg/dL — ABNORMAL LOW (ref 8.9–10.3)
Calcium: 8.7 mg/dL — ABNORMAL LOW (ref 8.9–10.3)
Chloride: 101 mmol/L (ref 98–111)
Chloride: 104 mmol/L (ref 98–111)
Creatinine, Ser: 1.32 mg/dL — ABNORMAL HIGH (ref 0.44–1.00)
Creatinine, Ser: 1.22 mg/dL — ABNORMAL HIGH (ref 0.44–1.00)
GFR, Estimated: 42 mL/min — ABNORMAL LOW (ref >60)
GFR, Estimated: 46 mL/min — ABNORMAL LOW (ref >60)
Glucose, Bld: 161 mg/dL — ABNORMAL HIGH (ref 70–99)
Glucose, Bld: 130 mg/dL — ABNORMAL HIGH (ref 70–99)
Potassium: 3.3 mmol/L — ABNORMAL LOW (ref 3.5–5.1)
Potassium: 3.7 mmol/L (ref 3.5–5.1)
Sodium: 140 mmol/L (ref 135–145)
Sodium: 142 mmol/L (ref 135–145)

## 2020-11-05 LAB — GLUCOSE, CAPILLARY
Glucose-Capillary: 126 mg/dL — ABNORMAL HIGH (ref 70–99)
Glucose-Capillary: 132 mg/dL — ABNORMAL HIGH (ref 70–99)
Glucose-Capillary: 135 mg/dL — ABNORMAL HIGH (ref 70–99)
Glucose-Capillary: 157 mg/dL — ABNORMAL HIGH (ref 70–99)
Glucose-Capillary: 159 mg/dL — ABNORMAL HIGH (ref 70–99)
Glucose-Capillary: 165 mg/dL — ABNORMAL HIGH (ref 70–99)
Glucose-Capillary: 165 mg/dL — ABNORMAL HIGH (ref 70–99)

## 2020-11-05 LAB — MAGNESIUM: Magnesium: 1.7 mg/dL (ref 1.7–2.4)

## 2020-11-05 LAB — T4, FREE: Free T4: 0.47 ng/dL — ABNORMAL LOW (ref 0.61–1.12)

## 2020-11-05 MED ORDER — POTASSIUM CHLORIDE 10 MEQ/50ML IV SOLN
10.0000 meq | INTRAVENOUS | Status: AC
  Administered 2020-11-05 (×4): 10 meq via INTRAVENOUS
  Filled 2020-11-05 (×4): qty 50

## 2020-11-05 MED ORDER — MIDAZOLAM HCL 2 MG/2ML IJ SOLN
2.0000 mg | INTRAMUSCULAR | Status: DC | PRN
  Administered 2020-11-05 – 2020-11-07 (×15): 2 mg via INTRAVENOUS
  Filled 2020-11-05 (×12): qty 2

## 2020-11-05 MED ORDER — MIDAZOLAM HCL 2 MG/2ML IJ SOLN
2.0000 mg | INTRAMUSCULAR | Status: AC | PRN
  Administered 2020-11-05 (×3): 2 mg via INTRAVENOUS
  Filled 2020-11-05 (×6): qty 2

## 2020-11-05 MED ORDER — POTASSIUM CHLORIDE 20 MEQ PO PACK: 40.0000 meq | PACK | Freq: Once | ORAL | Status: DC

## 2020-11-05 MED ORDER — MAGNESIUM SULFATE 2 GM/50ML IV SOLN
2.0000 g | Freq: Once | INTRAVENOUS | Status: AC
  Administered 2020-11-05: 2 g via INTRAVENOUS
  Filled 2020-11-05: qty 50

## 2020-11-05 MED ORDER — POTASSIUM CHLORIDE 20 MEQ PO PACK
20.0000 meq | PACK | ORAL | Status: AC
  Administered 2020-11-05 (×2): 20 meq
  Filled 2020-11-05 (×2): qty 1

## 2020-11-05 MED FILL — Medication: Qty: 1 | Status: AC

## 2020-11-05 NOTE — Progress Notes (Signed)
NAME:  [name], MRN:  [id number], DOB:  February 19, 1946, LOS: 39 ADMISSION DATE:  10/14/2020, CONSULTATION DATE:  7/5 REFERRING MD:  Barry Dienes, CHIEF COMPLAINT:  Cardiac arrest   History of Present Illness:  [name] is a 75 y.o. 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 She had procedure done 6/28.  Pathology was negative for malignancy but showed LGD with negative nodes.   Early AM 7/5, she had wheezing so received a breathing treatment.  Shortly thereafter, she was found with emesis all over bed and floor.  She had gurgling sounds and altered LOC.  She ended up having loss of pulses with initial rhythm asystole.  She received 1mg  epinephrine before PEA for which she received a 2nd dose of epinephrine.  She then had ROSC after 6 minutes.  She was intubated during the code.   She was transferred to [facility] ICU where PCCM was asked to assist with vent management.  Pertinent  Medical History  OSA DM2 Hypertension CAD Allergic rhinitis   Significant Hospital Events: Including procedures, antibiotic start and stop dates in addition to other pertinent events   6/28 > admit, robotic distal gastrectomy,  7/5 > 6 minute code (asystole then PEA), severe hypoxemic respiratory failure and shock 7/5 levaquin x1 7/5 ceftriaxone > 7/6 7/5 doxycycline > 7/6 7/6 cefepime >  7/6 EEG > generalized slowing suggestive of severe diffuse encephalopathy non-specific in etiology  7/6 TTE > LVEF 65-70%, grade II diastolic dysfunction, RSVP elevated 58.1 mmHg, trivial MR  Interim History / Subjective:  7/9: no acute events again. Tolerating 40-32ml/hr tf. Good uop with lasix again yesterday. Now only 1.4 L positive for hospital stay. Remains on norepi infusion. Awaiting stability on laying flat for MRI for prognostication, will increase sedation in short term  to hopefully facilitate this. TSH is mildly elevated despite replacement but doubtful accounting for degree of encephalopathy. Per RN was able to follow commands this am weakly. Not for me yet.  7/8: no acute events reported overnight. Wbc stable to improved, tmax 98.7 on cpap 14/5 this am for >2 hr but back to full support with some desat. Will get MRI once able to lay flat. ? Improvement in mental status vs involuntary movements that appear periodically purposeful  Objective   Blood pressure (!) 169/64, pulse 92, temperature 99.4 F (37.4 C), temperature source Oral, resp. rate 13, height 4\' 11"  (1.499 m), weight 96.3 kg, SpO2 93 %. CVP:  [0 mmHg-11 mmHg] 2 mmHg  Vent Mode: PSV;CPAP FiO2 (%):  [40 %-50 %] 50 % Set Rate:  [22 bmp] 22 bmp Vt Set:  [360 mL] 360 mL PEEP:  [8 cmH20] 8 cmH20 Pressure Support:  [12 cmH20] 12 cmH20 Plateau Pressure:  [19 cmH20-20 cmH20] 20 cmH20   Intake/Output Summary (Last 24 hours) at 11/05/2020 0756 Last data filed at 11/05/2020 5035 Gross per 24 hour  Intake 1482.11 ml  Output 5095 ml  Net -3612.89 ml   Filed Weights   11/03/20 0600 11/04/20 0519 11/05/20 0407  Weight: 103.2 kg 100.1 kg 96.3 kg    Examination:  General:  In bed on vent arousable but not following commands HENT: NCAT ETT in place, mmmp, sclera injected  PULM: diminshed bilaterally CV: RRR, no mgr GI: BS present, + edema MSK: normal bulk and tone Neuro: reportedly weakly following commands for RN this am    ABG  Component Value Date/Time   PHART 7.202 (L) 11/01/2020 1005   PCO2ART 56.5 (H) 11/01/2020 1005   PO2ART 56 (L) 11/01/2020 1005   HCO3 22.2 11/01/2020 1005   TCO2 24 11/01/2020 1005   ACIDBASEDEF 6.0 (H) 11/01/2020 1005   O2SAT 69.5 11/02/2020 0906   7/5 resp culture > normal flora   Resolved Hospital Problem list     Assessment & Plan:  Septic shock from aspiration pneumonia, improved parameters today TTE OK -cont ceftriaxone in light of negative cx. For  at least 5 days.  -wbc is decreasing.  -norepi only off for short period 7/8 -monitor for stability   Anasarca due to fluid administration -improving,  -holding lasix with pressors back on  -bmp in am   Severe acute respiratory failure with hypoxemia> improving significantly Full mechanical vent support Wean down PEEP/FiO2 for SaO2 > 88% VAP prevention Daily WUA/SBT  Lactic acidosis : improving Continue supportive care/sepssi management as above  AKI Monitor BMET and UOP Replace electrolytes as needed -renal function remains stable  At risk for anoxic brain injury after cardiac arrest Abnormal motor movement on 7/6> myoclonus? Seizure disorder, EEG doesn't show seizures on 7/6 but consistent with encephalopathy, could be sedation related but concerned she may have brain injury Continue Keppra -cont to minimize sedation Neuroprognostication plan: increasing sedation to facilitate pt laying flat and minimize movement for MRI today.  -for completeness sake ammonia: normal, tsh (normal in 2015) is mildly elevated at 5.5: t4/t3 pending.   Need for sedation for mechanical ventilation Severe vent dyssynchrony  Change RASS goal to 0 Change versed to prn Continue fentanyl infusion as needed, liberalize sedation parameters for mri  Ileus post partial gastrectomy for malignant polyp Tube feeding Care per primary service -cont low dose reglan at this time.  -feedings tolerated via cortrak, tolerating at 40-49ml/hr  DM2 SSI, monitor accucheck q4h  Best Practice (right click and "Reselect all SmartList Selections" daily)   Diet/type: tubefeeds DVT prophylaxis: LMWH GI prophylaxis: PPI Lines: Central line and yes and it is still needed Foley:  N/A Code Status:  full code Last date of multidisciplinary goals of care discussion [per primary]  Labs   CBC: Recent Labs  Lab 11/01/20 0604 11/01/20 0801 11/01/20 1005 11/02/20 0428 11/03/20 0421 11/04/20 0553 11/05/20 0406   WBC 33.2*  --   --  33.8* 23.5* 21.0* 18.6*  NEUTROABS  --   --   --  31.8* 19.9* 16.2*  --   HGB 9.3*   < > 9.9* 8.7* 7.4* 7.8* 7.7*  HCT 29.4*   < > 29.0* 27.9* 24.5* 24.8* 24.8*  MCV 93.6  --   --  93.3 95.3 92.5 93.6  PLT 257  --   --  218 168 172 170   < > = values in this interval not displayed.    Basic Metabolic Panel: Recent Labs  Lab 11/02/20 0428 11/02/20 2000 11/03/20 0421 11/04/20 0929 11/04/20 1755 11/05/20 0406  NA 131*  --  133* 138 140 140  K 3.7  --  4.2 3.2* 3.3* 3.3*  CL 99  --  104 106 102 101  CO2 21*  --  20* 28 31 31   GLUCOSE 164*  --  259* 138* 138* 161*  BUN 17  --  20 23 22 23   CREATININE 1.30*  --  1.27* 1.30* 1.35* 1.32*  CALCIUM 8.2*  --  8.4* 8.4* 8.6* 8.6*  MG 1.2* 2.6*  --  1.9  --  1.7  PHOS 3.8  --   --   --   --   --    GFR: Estimated Creatinine Clearance: 36.3 mL/min (A) (by C-G formula based on SCr of 1.32 mg/dL (H)). Recent Labs  Lab 11/01/20 0604 11/01/20 0950 11/02/20 0428 11/02/20 0910 11/02/20 1952 11/03/20 0421 11/04/20 0553 11/05/20 0406  WBC 33.2*  --  33.8*  --   --  23.5* 21.0* 18.6*  LATICACIDVEN 4.8* 3.7*  --  3.4* 2.1*  --   --   --     Liver Function Tests: Recent Labs  Lab 11/02/20 0428 11/03/20 0421 11/04/20 0929  AST 23 19 17   ALT 17 13 13   ALKPHOS 57 80 70  BILITOT 7.0* 5.3* 3.4*  PROT 4.9* 4.6* 4.5*  ALBUMIN 1.8* 1.6* 1.5*   No results for input(s): LIPASE, AMYLASE in the last 168 hours. Recent Labs  Lab 11/04/20 1230  AMMONIA 25    ABG    Component Value Date/Time   PHART 7.202 (L) 11/01/2020 1005   PCO2ART 56.5 (H) 11/01/2020 1005   PO2ART 56 (L) 11/01/2020 1005   HCO3 22.2 11/01/2020 1005   TCO2 24 11/01/2020 1005   ACIDBASEDEF 6.0 (H) 11/01/2020 1005   O2SAT 69.5 11/02/2020 0906     Coagulation Profile: No results for input(s): INR, PROTIME in the last 168 hours.  Cardiac Enzymes: No results for input(s): CKTOTAL, CKMB, CKMBINDEX, TROPONINI in the last 168  hours.  HbA1C: Hgb A1c MFr Bld  Date/Time Value Ref Range Status  10/19/2020 11:50 AM 6.1 (H) 4.8 - 5.6 % Final    Comment:    (NOTE) Pre diabetes:          5.7%-6.4%  Diabetes:              >6.4%  Glycemic control for   <7.0% adults with diabetes   10/21/2013 03:57 PM 6.4 (H) <5.7 % Final    Comment:    (NOTE)                                                                       According to the ADA Clinical Practice Recommendations for 2011, when HbA1c is used as a screening test:  >=6.5%   Diagnostic of Diabetes Mellitus           (if abnormal result is confirmed) 5.7-6.4%   Increased risk of developing Diabetes Mellitus References:Diagnosis and Classification of Diabetes Mellitus,Diabetes MOQH,4765,46(TKPTW 1):S62-S69 and Standards of Medical Care in         Diabetes - 2011,Diabetes SFKC,1275,17 (Suppl 1):S11-S61.    CBG: Recent Labs  Lab 11/04/20 1056 11/04/20 1556 11/04/20 2005 11/04/20 2357 11/05/20 0345  GLUCAP 149* 150* 125* 132* 159*    Critical care time: The patient is critically ill with multiple organ systems failure and requires high complexity decision making for assessment and support, frequent evaluation and titration of therapies, application of advanced monitoring technologies and extensive interpretation of multiple databases.  Critical care time 37 mins. This represents my time independent of the NPs time taking care of the pt. This is excluding procedures.    Audria Nine DO [facility] 11/05/2020, 7:56 AM See Amion for pager If no response to pager, please call [phone] until 1900 After 1900 please call Warren Lacy [phone]

## 2020-11-05 NOTE — Progress Notes (Signed)
RT attempted SBT 5/8 on pt, but PS had to be increased to 12 to achieve adequate VT's. Pt currently weaning on PSV 12/8 and is tolerating well at this time. RN made aware.

## 2020-11-05 NOTE — Progress Notes (Signed)
Pt transported to MRI and back to [place] 2 on full vent support. No complications noted.

## 2020-11-05 NOTE — Progress Notes (Signed)
11 Days Post-Op   Subjective/Chief Complaint: Still requiring some Levophed support Minimal interaction to stimuli Tolerating tube feeds   Objective: Vital signs in last 24 hours: Temp:  [97.8 F (36.6 C)-99.4 F (37.4 C)] 99.4 F (37.4 C) (07/09 0300) Pulse Rate:  [53-96] 92 (07/09 0729) Resp:  [13-34] 13 (07/09 0729) BP: (93-169)/(33-116) 169/64 (07/09 0400) SpO2:  [86 %-99 %] 93 % (07/09 0729) Arterial Line BP: (89-180)/(32-52) 110/32 (07/09 0500) FiO2 (%):  [40 %-50 %] 50 % (07/09 0729) Weight:  [96.3 kg] 96.3 kg (07/09 0407) Last BM Date: 10/31/20  Intake/Output from previous day: 07/08 0701 - 07/09 0700 In: 1482.1 [I.V.:60.3; NG/GT:1021.7; IV Piggyback:400.1] Out: 1540 [Urine:3145; Emesis/NG output:1950] Intake/Output this shift: No intake/output data recorded.    General: intubated, sedated. Heart: regular, rate, and rhythm.   Resp: on vent, no distress GI - soft, non distended, non tender.  Incisions c/d/i    Lab Results:  Recent Labs    11/04/20 0553 11/05/20 0406  WBC 21.0* 18.6*  HGB 7.8* 7.7*  HCT 24.8* 24.8*  PLT 172 170   BMET Recent Labs    11/04/20 1755 11/05/20 0406  NA 140 140  K 3.3* 3.3*  CL 102 101  CO2 31 31  GLUCOSE 138* 161*  BUN 22 23  CREATININE 1.35* 1.32*  CALCIUM 8.6* 8.6*   PT/INR No results for input(s): LABPROT, INR in the last 72 hours. ABG No results for input(s): PHART, HCO3 in the last 72 hours.  Invalid input(s): PCO2, PO2  Studies/Results: DG Chest Port 1 View  Result Date: 11/04/2020 CLINICAL DATA:  Acute respiratory failure with hypoxemia. EXAM: PORTABLE CHEST 1 VIEW COMPARISON:  November 02, 2020. FINDINGS: Stable cardiomegaly. Endotracheal and nasogastric tubes are unchanged in position. Left internal jugular catheter is unchanged. No pneumothorax is noted. Bilateral lung opacities are again noted concerning for pneumonia or edema. Left pleural effusion may be present. Bony thorax is unremarkable. IMPRESSION:  Stable support apparatus.  Stable bilateral lung opacities. Aortic Atherosclerosis (ICD10-I70.0). Electronically Signed   By: Marijo Conception M.D.   On: 11/04/2020 09:06    Anti-infectives: Anti-infectives (From admission, onward)    Start     Dose/Rate Route Frequency Ordered Stop   11/04/20 1245  cefTRIAXone (ROCEPHIN) 2 g in sodium chloride 0.9 % 100 mL IVPB        2 g 200 mL/hr over 30 Minutes Intravenous Every 24 hours 11/04/20 1148 11/07/20 1244   11/02/20 1000  cefTRIAXone (ROCEPHIN) 2 g in sodium chloride 0.9 % 100 mL IVPB  Status:  Discontinued        2 g 200 mL/hr over 30 Minutes Intravenous Every 24 hours 11/01/20 1257 11/02/20 0856   11/02/20 1000  doxycycline (VIBRAMYCIN) 100 mg in sodium chloride 0.9 % 250 mL IVPB  Status:  Discontinued        100 mg 125 mL/hr over 120 Minutes Intravenous Every 12 hours 11/01/20 1257 11/02/20 0856   11/02/20 1000  ceFEPIme (MAXIPIME) 2 g in sodium chloride 0.9 % 100 mL IVPB  Status:  Discontinued        2 g 200 mL/hr over 30 Minutes Intravenous Every 12 hours 11/02/20 0856 11/04/20 1148   11/01/20 0915  levofloxacin (LEVAQUIN) IVPB 750 mg  Status:  Discontinued        750 mg 100 mL/hr over 90 Minutes Intravenous Every 24 hours 11/01/20 0827 11/01/20 1257   11/01/20 0730  levofloxacin (LEVAQUIN) IVPB 500 mg  Status:  Discontinued  500 mg 100 mL/hr over 60 Minutes Intravenous Every 24 hours 11/01/20 0631 11/01/20 1257   10/14/2020 2100  ciprofloxacin (CIPRO) IVPB 400 mg        400 mg 200 mL/hr over 60 Minutes Intravenous Every 12 hours 10/24/2020 1258 10/08/2020 2148   10/21/2020 0600  ciprofloxacin (CIPRO) IVPB 400 mg        400 mg 200 mL/hr over 60 Minutes Intravenous On call to O.R. 10/22/2020 0547 09/29/2020 0847       Assessment/Plan: s/p Procedure(s): XI ROBOTIC ASSISTED DISTAL GASTRECTOMY (N/A)    Pathology from surgery 6/28, low grade dysplasia.  No cancer and 13 negative nodes.     Aspiration was witnessed and patient  immediately received CPR and drugs with ROSC after a few minutes.   Shock - presumed septic shock. On cefepime. Cultures pending  Seems to be secondary to aspiration, but WBCs were elevated pre aspiration event.  WBC continues to improve VDRF - vent settings improved.  Down to 0.5 FiO2 and 10 PEEP.   Neuro- question of seizures based on myoclonic jerking.  EEG done, showed generalized slowing and no seizures.  Concern for anoxic injury - plan to wean sedation to see if there is any improvement in neuro status Echo shows preserved ejection fraction and only grade 2 diastolic dysfunction.  Moderately elevated PA pressures.     Presumed functional gastric outlet obstruction.  Upper gi earlier was negative for leak or for obstruction.  Keep NPO for now. Coretrack is out of the stomach into the small intestine.  At goal rate on tube feeds  No BM since surgery  Spoke with the husband at the bedside        LOS: 11 days    Maia Petties 11/05/2020

## 2020-11-05 NOTE — Progress Notes (Signed)
[facility] ADULT ICU REPLACEMENT PROTOCOL   The patient does apply for the [facility] Adult ICU Electrolyte Replacment Protocol based on the criteria listed below:   1. Is GFR >/= 30 ml/min? Yes.    Patient's GFR today is 42 2. Is SCr </= 2? Yes.   Patient's SCr is 1.32 ml/kg/hr 3. Did SCr increase >/= 0.5 in 24 hours? No. 4. Abnormal electrolyte(s):   K 3.3, Mg 1.7 5. Ordered repletion with: protocol 6. If a panic level lab has been reported, has the CCM MD in charge been notified?  Physician:  Bernadene Person R Stanton Kissoon 11/05/2020 5:24 AM

## 2020-11-05 NOTE — Progress Notes (Signed)
Pt placed back on full vent support due to increase in agitation. RN at bedside.

## 2020-11-06 DIAGNOSIS — I469 Cardiac arrest, cause unspecified: Secondary | ICD-10-CM | POA: Diagnosis not present

## 2020-11-06 LAB — GLUCOSE, CAPILLARY
Glucose-Capillary: 193 mg/dL — ABNORMAL HIGH (ref 70–99)
Glucose-Capillary: 146 mg/dL — ABNORMAL HIGH (ref 70–99)
Glucose-Capillary: 166 mg/dL — ABNORMAL HIGH (ref 70–99)
Glucose-Capillary: 148 mg/dL — ABNORMAL HIGH (ref 70–99)
Glucose-Capillary: 156 mg/dL — ABNORMAL HIGH (ref 70–99)
Glucose-Capillary: 138 mg/dL — ABNORMAL HIGH (ref 70–99)

## 2020-11-06 LAB — BASIC METABOLIC PANEL
Anion gap: 6 (ref 5–15)
BUN: 20 mg/dL (ref 8–23)
CO2: 28 mmol/L (ref 22–32)
Calcium: 8.6 mg/dL — ABNORMAL LOW (ref 8.9–10.3)
Chloride: 107 mmol/L (ref 98–111)
Creatinine, Ser: 1.18 mg/dL — ABNORMAL HIGH (ref 0.44–1.00)
GFR, Estimated: 48 mL/min — ABNORMAL LOW (ref >60)
Glucose, Bld: 196 mg/dL — ABNORMAL HIGH (ref 70–99)
Potassium: 4.6 mmol/L (ref 3.5–5.1)
Sodium: 141 mmol/L (ref 135–145)

## 2020-11-06 LAB — CBC
HCT: 26 % — ABNORMAL LOW (ref 36.0–46.0)
Hemoglobin: 7.7 g/dL — ABNORMAL LOW (ref 12.0–15.0)
MCH: 29.1 pg (ref 26.0–34.0)
MCHC: 29.6 g/dL — ABNORMAL LOW (ref 30.0–36.0)
MCV: 98.1 fL (ref 80.0–100.0)
Platelets: 177 10*3/uL (ref 150–400)
RBC: 2.65 MIL/uL — ABNORMAL LOW (ref 3.87–5.11)
RDW: 16.5 % — ABNORMAL HIGH (ref 11.5–15.5)
WBC: 20.1 10*3/uL — ABNORMAL HIGH (ref 4.0–10.5)
nRBC: 0.1 % (ref 0.0–0.2)

## 2020-11-06 LAB — MAGNESIUM: Magnesium: 2.1 mg/dL (ref 1.7–2.4)

## 2020-11-06 MED ORDER — MIDODRINE HCL 5 MG PO TABS
10.0000 mg | ORAL_TABLET | Freq: Three times a day (TID) | ORAL | Status: DC
  Administered 2020-11-06 (×2): 10 mg via ORAL
  Filled 2020-11-06 (×3): qty 2

## 2020-11-06 MED ORDER — METOCLOPRAMIDE HCL 5 MG/ML IJ SOLN
10.0000 mg | Freq: Three times a day (TID) | INTRAMUSCULAR | Status: DC
  Administered 2020-11-06 – 2020-11-21 (×44): 10 mg via INTRAVENOUS
  Filled 2020-11-06 (×44): qty 2

## 2020-11-06 MED ORDER — SENNA 8.6 MG PO TABS
2.0000 | ORAL_TABLET | Freq: Every day | ORAL | Status: DC
  Filled 2020-11-06: qty 2

## 2020-11-06 MED ORDER — SORBITOL 70 % SOLN
30.0000 mL | Freq: Once | Status: AC
  Administered 2020-11-06: 30 mL
  Filled 2020-11-06: qty 30

## 2020-11-06 MED ORDER — POLYETHYLENE GLYCOL 3350 17 G PO PACK
17.0000 g | PACK | Freq: Two times a day (BID) | ORAL | Status: DC
  Filled 2020-11-06 (×2): qty 1

## 2020-11-06 MED ORDER — LEVOTHYROXINE SODIUM 75 MCG PO TABS
150.0000 ug | ORAL_TABLET | Freq: Every day | ORAL | Status: DC
  Administered 2020-11-06 – 2020-11-09 (×2): 150 ug via ORAL
  Filled 2020-11-06 (×3): qty 2

## 2020-11-06 NOTE — Progress Notes (Signed)
12 Days Post-Op   Subjective/Chief Complaint: Not following commands Increasing vent settings MRI brain - no acute abnormalities   Objective: Vital signs in last 24 hours: Temp:  [97.6 F (36.4 C)-99 F (37.2 C)] 98.4 F (36.9 C) (07/10 0730) Pulse Rate:  [57-121] 96 (07/10 0920) Resp:  [18-30] 25 (07/10 0920) BP: (132-156)/(34-50) 132/34 (07/10 0920) SpO2:  [88 %-100 %] 96 % (07/10 0920) Arterial Line BP: (97-183)/(37-66) 138/43 (07/10 0920) FiO2 (%):  [50 %-70 %] 60 % (07/10 0728) Weight:  [97.5 kg] 97.5 kg (07/10 0433) Last BM Date: 10/31/20  Intake/Output from previous day: 07/09 0701 - 07/10 0700 In: 2338.2 [I.V.:442.9; QB/HA:1937.9; IV Piggyback:456] Out: 1890 [Urine:840; Emesis/NG output:1050] Intake/Output this shift: Total I/O In: 289.5 [I.V.:19.5; NG/GT:270] Out: 75 [Urine:75]  General: intubated, sedated. Heart: regular, rate, and rhythm.   Resp: on vent, no distress GI - soft, non distended, non tender.  Incisions c/d/i  Lab Results:  Recent Labs    11/05/20 0406 11/06/20 0432  WBC 18.6* 20.1*  HGB 7.7* 7.7*  HCT 24.8* 26.0*  PLT 170 177   BMET Recent Labs    11/05/20 1535 11/06/20 0432  NA 142 141  K 3.7 4.6  CL 104 107  CO2 30 28  GLUCOSE 130* 196*  BUN 22 20  CREATININE 1.22* 1.18*  CALCIUM 8.7* 8.6*   PT/INR No results for input(s): LABPROT, INR in the last 72 hours. ABG No results for input(s): PHART, HCO3 in the last 72 hours.  Invalid input(s): PCO2, PO2  Studies/Results: MR BRAIN WO CONTRAST  Result Date: 11/05/2020 CLINICAL DATA:  Mental status change. EXAM: MRI HEAD WITHOUT CONTRAST TECHNIQUE: Multiplanar, multiecho pulse sequences of the brain and surrounding structures were obtained without intravenous contrast. COMPARISON:  MRI October 22, 2013. FINDINGS: Brain: No acute infarction, hemorrhage, hydrocephalus, extra-axial collection or mass lesion. Mild for age T2/FLAIR hyperintensities within the white matter, most likely  related to chronic microvascular ischemic disease. Small dilated perivascular spaces in bilateral basal ganglia. Vascular: Major arterial flow voids are maintained skull base. Skull and upper cervical spine: Normal marrow signal. Hyperostosis frontalis. Sinuses/Orbits: Moderate sphenoid sinus and ethmoid air cell mucosal thickening. Fluid layering in the pharynx. Other: Moderate bilateral mastoid effusions. IMPRESSION: 1. No evidence of acute intracranial abnormality. 2. Mild for age chronic microvascular ischemic disease. 3. Moderate bilateral mastoid effusions and paranasal sinus disease, potentially related to the patient's intubated status. Electronically Signed   By: Margaretha Sheffield MD   On: 11/05/2020 15:13    Anti-infectives: Anti-infectives (From admission, onward)    Start     Dose/Rate Route Frequency Ordered Stop   11/04/20 1245  cefTRIAXone (ROCEPHIN) 2 g in sodium chloride 0.9 % 100 mL IVPB        2 g 200 mL/hr over 30 Minutes Intravenous Every 24 hours 11/04/20 1148 11/07/20 1244   11/02/20 1000  cefTRIAXone (ROCEPHIN) 2 g in sodium chloride 0.9 % 100 mL IVPB  Status:  Discontinued        2 g 200 mL/hr over 30 Minutes Intravenous Every 24 hours 11/01/20 1257 11/02/20 0856   11/02/20 1000  doxycycline (VIBRAMYCIN) 100 mg in sodium chloride 0.9 % 250 mL IVPB  Status:  Discontinued        100 mg 125 mL/hr over 120 Minutes Intravenous Every 12 hours 11/01/20 1257 11/02/20 0856   11/02/20 1000  ceFEPIme (MAXIPIME) 2 g in sodium chloride 0.9 % 100 mL IVPB  Status:  Discontinued  2 g 200 mL/hr over 30 Minutes Intravenous Every 12 hours 11/02/20 0856 11/04/20 1148   11/01/20 0915  levofloxacin (LEVAQUIN) IVPB 750 mg  Status:  Discontinued        750 mg 100 mL/hr over 90 Minutes Intravenous Every 24 hours 11/01/20 0827 11/01/20 1257   11/01/20 0730  levofloxacin (LEVAQUIN) IVPB 500 mg  Status:  Discontinued        500 mg 100 mL/hr over 60 Minutes Intravenous Every 24 hours  11/01/20 0631 11/01/20 1257   10/20/2020 2100  ciprofloxacin (CIPRO) IVPB 400 mg        400 mg 200 mL/hr over 60 Minutes Intravenous Every 12 hours 09/28/2020 1258 10/17/2020 2148   10/14/2020 0600  ciprofloxacin (CIPRO) IVPB 400 mg        400 mg 200 mL/hr over 60 Minutes Intravenous On call to O.R. 10/08/2020 0547 10/12/2020 0847       Assessment/Plan: s/p Procedure(s): XI ROBOTIC ASSISTED DISTAL GASTRECTOMY (N/A)     Pathology from surgery 6/28, low grade dysplasia.  No cancer and 13 negative nodes.     Aspiration was witnessed and patient immediately received CPR and drugs with ROSC after a few minutes.   Shock - presumed septic shock. On cefepime. Cultures pending  Seems to be secondary to aspiration, but WBCs were elevated pre aspiration event.  WBC continues to improve VDRF - vent settings improved.  Down to 0.5 FiO2 and 10 PEEP.   Neuro- question of seizures based on myoclonic jerking.  EEG done, showed generalized slowing and no seizures.  Concern for anoxic injury - plan to wean sedation to see if there is any improvement in neuro status Echo shows preserved ejection fraction and only grade 2 diastolic dysfunction.  Moderately elevated PA pressures.     Presumed functional gastric outlet obstruction.  Upper gi earlier was negative for leak or for obstruction.  Keep NPO for now. Cortrack is out of the stomach into the small intestine.  At goal rate on tube feeds   No BM since surgery    LOS: 12 days    [name] 11/06/2020

## 2020-11-06 NOTE — Progress Notes (Addendum)
Pt was agitated, this RN went to [facility] for medication, upon entering pt's room she had Core track wrapped on her arm and had pulled it out to 40 cm. Called Gen Surg on call, Ellin Saba. Tube feeds stopped at this time.

## 2020-11-06 NOTE — Progress Notes (Signed)
NAME:  [name], MRN:  [id number], DOB:  05-08-1945, LOS: 12 ADMISSION DATE:  09/28/2020, CONSULTATION DATE:  7/5 REFERRING MD:  Barry Dienes, CHIEF COMPLAINT:  Cardiac arrest   History of Present Illness:  [name] is a 75 y.o. 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 She had procedure done 6/28.  Pathology was negative for malignancy but showed LGD with negative nodes.   Early AM 7/5, she had wheezing so received a breathing treatment.  Shortly thereafter, she was found with emesis all over bed and floor.  She had gurgling sounds and altered LOC.  She ended up having loss of pulses with initial rhythm asystole.  She received 1mg  epinephrine before PEA for which she received a 2nd dose of epinephrine.  She then had ROSC after 6 minutes.  She was intubated during the code.   She was transferred to [facility] ICU where PCCM was asked to assist with vent management.  Pertinent  Medical History  OSA DM2 Hypertension CAD Allergic rhinitis   Significant Hospital Events: Including procedures, antibiotic start and stop dates in addition to other pertinent events   6/28 > admit, robotic distal gastrectomy,  7/5 > 6 minute code (asystole then PEA), severe hypoxemic respiratory failure and shock 7/5 levaquin x1 7/5 ceftriaxone > 7/6 7/5 doxycycline > 7/6 7/6 cefepime >  7/6 EEG > generalized slowing suggestive of severe diffuse encephalopathy non-specific in etiology  7/6 TTE > LVEF 65-70%, grade II diastolic dysfunction, RSVP elevated 58.1 mmHg, trivial MR  Interim History / Subjective:  7/10: mri yesterday without acute intracranial abnormality. Still with poor response. Unfortunately also increased vent settings. Increasing levothyroxine dosing today  7/9: no acute events again. Tolerating 40-41ml/hr tf. Good uop with lasix again yesterday.  Now only 1.4 L positive for hospital stay. Remains on norepi infusion. Awaiting stability on laying flat for MRI for prognostication, will increase sedation in short term to hopefully facilitate this. TSH is mildly elevated despite replacement but doubtful accounting for degree of encephalopathy. Per RN was able to follow commands this am weakly. Not for me yet.  7/8: no acute events reported overnight. Wbc stable to improved, tmax 98.7 on cpap 14/5 this am for >2 hr but back to full support with some desat. Will get MRI once able to lay flat. ? Improvement in mental status vs involuntary movements that appear periodically purposeful  Objective   Blood pressure (!) 132/34, pulse 96, temperature 98.4 F (36.9 C), temperature source Axillary, resp. rate (!) 25, height 4\' 11"  (1.499 m), weight 97.5 kg, SpO2 96 %. CVP:  [1 mmHg-10 mmHg] 3 mmHg  Vent Mode: PRVC FiO2 (%):  [50 %-70 %] 60 % Set Rate:  [22 bmp] 22 bmp Vt Set:  [360 mL] 360 mL PEEP:  [8 cmH20] 8 cmH20 Plateau Pressure:  [18 cmH20-22 cmH20] 20 cmH20   Intake/Output Summary (Last 24 hours) at 11/06/2020 0944 Last data filed at 11/06/2020 0900 Gross per 24 hour  Intake 2484.21 ml  Output 1935 ml  Net 549.21 ml   Filed Weights   11/04/20 0519 11/05/20 0407 11/06/20 0433  Weight: 100.1 kg 96.3 kg 97.5 kg    Examination:  General:  In bed on vent arousable but not following commands, purposeful in meeting HENT: NCAT ETT in place, mmmp, sclera injected  PULM: diffuse rhonchi CV: RRR, no mgr GI: BS present, + edema MSK:  normal bulk and tone Neuro: tremulous and purposeful but not alert   ABG    Component Value Date/Time   PHART 7.202 (L) 11/01/2020 1005   PCO2ART 56.5 (H) 11/01/2020 1005   PO2ART 56 (L) 11/01/2020 1005   HCO3 22.2 11/01/2020 1005   TCO2 24 11/01/2020 1005   ACIDBASEDEF 6.0 (H) 11/01/2020 1005   O2SAT 69.5 11/02/2020 0906   7/5 resp culture > normal flora   Resolved Hospital Problem list      Assessment & Plan:  Septic shock from aspiration pneumonia, improved parameters today TTE OK -cont ceftriaxone in light of negative cx. Day 5 today but as still req pressors may warrant extending.  -wbc remains elevated but relatively stable -afebrile however.  -start midodrine -norepi still on -monitor for stability   Anasarca due to fluid administration -improving,  -holding lasix with pressors back on  -bmp in am   Severe acute respiratory failure with hypoxemia> improving significantly Full mechanical vent support Wean down PEEP/FiO2 for SaO2 > 88% VAP prevention Daily WUA/SBT  Lactic acidosis : improving Continue supportive care/sepsis management as above  AKI Monitor BMET and UOP Replace electrolytes as needed -renal function remains stable  At risk for anoxic brain injury after cardiac arrest Abnormal motor movement on 7/6> myoclonus? Seizure disorder, EEG doesn't show seizures on 7/6 but consistent with encephalopathy, could be sedation related but concerned she may have brain injury Continue Keppra -cont to minimize sedation Neuroprognostication plan: increasing sedation to facilitate pt laying flat and minimize movement for MRI today.  -for completeness sake ammonia: normal, tsh (normal in 2015) is mildly elevated at 5.5: t4/t3 pending.   Need for sedation for mechanical ventilation Severe vent dyssynchrony  Change RASS goal to 0 Change versed to prn Continue fentanyl infusion as needed, liberalize sedation parameters for mri  Ileus post partial gastrectomy for malignant polyp Tube feeding Care per primary service -cont reglan, increase dose, still without bm -feedings tolerated via cortrak, tolerating at 40-10ml/hr  DM2 SSI, monitor accucheck q4h  Hypothyroidism:  -increase levothyroxine dosing  -possibly contributing to hypoactive delirium/encephalopathy  Best Practice (right click and "Reselect all SmartList Selections" daily)   Diet/type:  tubefeeds DVT prophylaxis: LMWH GI prophylaxis: PPI Lines: Central line and yes and it is still needed Foley:  N/A Code Status:  full code Last date of multidisciplinary goals of care discussion [per primary]  Labs   CBC: Recent Labs  Lab 11/02/20 0428 11/03/20 0421 11/04/20 0553 11/05/20 0406 11/06/20 0432  WBC 33.8* 23.5* 21.0* 18.6* 20.1*  NEUTROABS 31.8* 19.9* 16.2*  --   --   HGB 8.7* 7.4* 7.8* 7.7* 7.7*  HCT 27.9* 24.5* 24.8* 24.8* 26.0*  MCV 93.3 95.3 92.5 93.6 98.1  PLT 218 168 172 170 829    Basic Metabolic Panel: Recent Labs  Lab 11/02/20 0428 11/02/20 2000 11/03/20 0421 11/04/20 0929 11/04/20 1755 11/05/20 0406 11/05/20 1535 11/06/20 0432  NA 131*  --    < > 138 140 140 142 141  K 3.7  --    < > 3.2* 3.3* 3.3* 3.7 4.6  CL 99  --    < > 106 102 101 104 107  CO2 21*  --    < > 28 31 31 30 28   GLUCOSE 164*  --    < > 138* 138* 161* 130* 196*  BUN 17  --    < > 23 22 23 22 20   CREATININE 1.30*  --    < > 1.30*  1.35* 1.32* 1.22* 1.18*  CALCIUM 8.2*  --    < > 8.4* 8.6* 8.6* 8.7* 8.6*  MG 1.2* 2.6*  --  1.9  --  1.7  --  2.1  PHOS 3.8  --   --   --   --   --   --   --    < > = values in this interval not displayed.   GFR: Estimated Creatinine Clearance: 40.9 mL/min (A) (by C-G formula based on SCr of 1.18 mg/dL (H)). Recent Labs  Lab 11/01/20 0604 11/01/20 0950 11/02/20 0428 11/02/20 0910 11/02/20 1952 11/03/20 0421 11/04/20 0553 11/05/20 0406 11/06/20 0432  WBC 33.2*  --    < >  --   --  23.5* 21.0* 18.6* 20.1*  LATICACIDVEN 4.8* 3.7*  --  3.4* 2.1*  --   --   --   --    < > = values in this interval not displayed.    Liver Function Tests: Recent Labs  Lab 11/02/20 0428 11/03/20 0421 11/04/20 0929  AST 23 19 17   ALT 17 13 13   ALKPHOS 57 80 70  BILITOT 7.0* 5.3* 3.4*  PROT 4.9* 4.6* 4.5*  ALBUMIN 1.8* 1.6* 1.5*   No results for input(s): LIPASE, AMYLASE in the last 168 hours. Recent Labs  Lab 11/04/20 1230  AMMONIA 25    ABG     Component Value Date/Time   PHART 7.202 (L) 11/01/2020 1005   PCO2ART 56.5 (H) 11/01/2020 1005   PO2ART 56 (L) 11/01/2020 1005   HCO3 22.2 11/01/2020 1005   TCO2 24 11/01/2020 1005   ACIDBASEDEF 6.0 (H) 11/01/2020 1005   O2SAT 69.5 11/02/2020 0906     Coagulation Profile: No results for input(s): INR, PROTIME in the last 168 hours.  Cardiac Enzymes: No results for input(s): CKTOTAL, CKMB, CKMBINDEX, TROPONINI in the last 168 hours.  HbA1C: Hgb A1c MFr Bld  Date/Time Value Ref Range Status  10/19/2020 11:50 AM 6.1 (H) 4.8 - 5.6 % Final    Comment:    (NOTE) Pre diabetes:          5.7%-6.4%  Diabetes:              >6.4%  Glycemic control for   <7.0% adults with diabetes   10/21/2013 03:57 PM 6.4 (H) <5.7 % Final    Comment:    (NOTE)                                                                       According to the ADA Clinical Practice Recommendations for 2011, when HbA1c is used as a screening test:  >=6.5%   Diagnostic of Diabetes Mellitus           (if abnormal result is confirmed) 5.7-6.4%   Increased risk of developing Diabetes Mellitus References:Diagnosis and Classification of Diabetes Mellitus,Diabetes TKZS,0109,32(TFTDD 1):S62-S69 and Standards of Medical Care in         Diabetes - 2011,Diabetes UKGU,5427,06 (Suppl 1):S11-S61.    CBG: Recent Labs  Lab 11/05/20 1521 11/05/20 1922 11/05/20 2340 11/06/20 0334 11/06/20 0727  GLUCAP 126* 165* 165* 193* 146*    Critical care time: The patient is critically ill with multiple organ systems failure and requires high complexity  decision making for assessment and support, frequent evaluation and titration of therapies, application of advanced monitoring technologies and extensive interpretation of multiple databases.  Critical care time 35 mins. This represents my time independent of the NPs time taking care of the pt. This is excluding procedures.    Audria Nine DO [facility] 11/06/2020, 9:44 AM See Amion for pager If no response to pager, please call [phone] until 1900 After 1900 please call [facility] [phone]

## 2020-11-07 ENCOUNTER — Inpatient Hospital Stay (HOSPITAL_COMMUNITY): Payer: Medicare PPO

## 2020-11-07 DIAGNOSIS — J9601 Acute respiratory failure with hypoxia: Secondary | ICD-10-CM | POA: Diagnosis not present

## 2020-11-07 DIAGNOSIS — I469 Cardiac arrest, cause unspecified: Secondary | ICD-10-CM | POA: Diagnosis not present

## 2020-11-07 LAB — POCT I-STAT 7, (LYTES, BLD GAS, ICA,H+H)
Acid-Base Excess: 2 mmol/L (ref 0.0–2.0)
Acid-Base Excess: 2 mmol/L (ref 0.0–2.0)
Bicarbonate: 28.9 mmol/L — ABNORMAL HIGH (ref 20.0–28.0)
Bicarbonate: 28.3 mmol/L — ABNORMAL HIGH (ref 20.0–28.0)
Calcium, Ion: 1.32 mmol/L (ref 1.15–1.40)
Calcium, Ion: 1.34 mmol/L (ref 1.15–1.40)
HCT: 28 % — ABNORMAL LOW (ref 36.0–46.0)
HCT: 23 % — ABNORMAL LOW (ref 36.0–46.0)
Hemoglobin: 9.5 g/dL — ABNORMAL LOW (ref 12.0–15.0)
Hemoglobin: 7.8 g/dL — ABNORMAL LOW (ref 12.0–15.0)
O2 Saturation: 92 %
O2 Saturation: 94 %
Patient temperature: 97.7
Patient temperature: 97.8
Potassium: 4.8 mmol/L (ref 3.5–5.1)
Potassium: 5 mmol/L (ref 3.5–5.1)
Sodium: 144 mmol/L (ref 135–145)
Sodium: 145 mmol/L (ref 135–145)
TCO2: 31 mmol/L (ref 22–32)
TCO2: 30 mmol/L (ref 22–32)
pCO2 arterial: 55.1 mmHg — ABNORMAL HIGH (ref 32.0–48.0)
pCO2 arterial: 54.2 mmHg — ABNORMAL HIGH (ref 32.0–48.0)
pH, Arterial: 7.325 — ABNORMAL LOW (ref 7.350–7.450)
pH, Arterial: 7.324 — ABNORMAL LOW (ref 7.350–7.450)
pO2, Arterial: 67 mmHg — ABNORMAL LOW (ref 83.0–108.0)
pO2, Arterial: 75 mmHg — ABNORMAL LOW (ref 83.0–108.0)

## 2020-11-07 LAB — BASIC METABOLIC PANEL
Anion gap: 10 (ref 5–15)
BUN: 25 mg/dL — ABNORMAL HIGH (ref 8–23)
CO2: 28 mmol/L (ref 22–32)
Calcium: 9.4 mg/dL (ref 8.9–10.3)
Chloride: 106 mmol/L (ref 98–111)
Creatinine, Ser: 1.34 mg/dL — ABNORMAL HIGH (ref 0.44–1.00)
GFR, Estimated: 41 mL/min — ABNORMAL LOW (ref >60)
Glucose, Bld: 147 mg/dL — ABNORMAL HIGH (ref 70–99)
Potassium: 4.4 mmol/L (ref 3.5–5.1)
Sodium: 144 mmol/L (ref 135–145)

## 2020-11-07 LAB — GLUCOSE, CAPILLARY
Glucose-Capillary: 138 mg/dL — ABNORMAL HIGH (ref 70–99)
Glucose-Capillary: 126 mg/dL — ABNORMAL HIGH (ref 70–99)
Glucose-Capillary: 117 mg/dL — ABNORMAL HIGH (ref 70–99)
Glucose-Capillary: 134 mg/dL — ABNORMAL HIGH (ref 70–99)
Glucose-Capillary: 162 mg/dL — ABNORMAL HIGH (ref 70–99)
Glucose-Capillary: 137 mg/dL — ABNORMAL HIGH (ref 70–99)

## 2020-11-07 LAB — CBC
HCT: 24.8 % — ABNORMAL LOW (ref 36.0–46.0)
Hemoglobin: 7.2 g/dL — ABNORMAL LOW (ref 12.0–15.0)
MCH: 28.6 pg (ref 26.0–34.0)
MCHC: 29 g/dL — ABNORMAL LOW (ref 30.0–36.0)
MCV: 98.4 fL (ref 80.0–100.0)
Platelets: 206 10*3/uL (ref 150–400)
RBC: 2.52 MIL/uL — ABNORMAL LOW (ref 3.87–5.11)
RDW: 16.8 % — ABNORMAL HIGH (ref 11.5–15.5)
WBC: 18.3 10*3/uL — ABNORMAL HIGH (ref 4.0–10.5)
nRBC: 0.1 % (ref 0.0–0.2)

## 2020-11-07 MED ORDER — DEXAMETHASONE SODIUM PHOSPHATE 4 MG/ML IJ SOLN
4.0000 mg | Freq: Four times a day (QID) | INTRAMUSCULAR | Status: AC
  Administered 2020-11-07 – 2020-11-09 (×8): 4 mg via INTRAVENOUS
  Filled 2020-11-07 (×8): qty 1

## 2020-11-07 MED ORDER — SORBITOL 70 % SOLN
960.0000 mL | TOPICAL_OIL | Freq: Once | ORAL | Status: AC
  Administered 2020-11-07: 960 mL via RECTAL
  Filled 2020-11-07: qty 473

## 2020-11-07 MED ORDER — METHYLNALTREXONE BROMIDE 12 MG/0.6ML ~~LOC~~ SOLN
12.0000 mg | Freq: Once | SUBCUTANEOUS | Status: DC
  Filled 2020-11-07 (×2): qty 0.6

## 2020-11-07 MED ORDER — RACEPINEPHRINE HCL 2.25 % IN NEBU
INHALATION_SOLUTION | RESPIRATORY_TRACT | Status: AC
  Administered 2020-11-07: 0.5 mL
  Filled 2020-11-07: qty 0.5

## 2020-11-07 NOTE — Procedures (Signed)
Cortrak  Person Inserting Tube:  Tasneem Cormier, RD Tube Type:  Cortrak - 43 inches Tube Size:  10 Tube Location:  Right nare Initial Placement:  Postpyloric Secured by: Bridle Technique Used to Measure Tube Placement:  Marking at nare/corner of mouth Cortrak Secured At:  85 cm  Cortrak Tube Team Note:  Consult received to adjust Cortrak after accidental removal by patient. The existing Cortrak was advanced to post pyloric position.   X-ray is required, abdominal x-ray has been ordered by the Cortrak team. Please confirm tube placement before using the Cortrak tube.   If the tube becomes dislodged please keep the tube and contact the Cortrak team at [email or web address] (password [email or web address]) for replacement.  If after hours and replacement cannot be delayed, place a NG tube and confirm placement with an abdominal x-ray.    Mariana Single MS, RD, LDN, CNSC Clinical Nutrition Pager listed in [place]

## 2020-11-07 NOTE — Progress Notes (Signed)
SLP Cancellation Note  Patient Details Name: [name] MRN: [id number] DOB: 11-28-1945   Cancelled treatment:       Reason Eval/Treat Not Completed: Medical issues which prohibited therapy; pt extubated around 11 am; noted stridor; nursing deferred; ST will continue efforts when pt able.   Elvina Sidle, M.S., [place] 11/07/2020, 12:18 PM

## 2020-11-07 NOTE — Progress Notes (Signed)
13 Days Post-Op   Subjective/Chief Complaint: Extubated, but had some stridor.  Following some commands, but not conversant.     Objective: Vital signs in last 24 hours: Temp:  [98 F (36.7 C)-100 F (37.8 C)] 98.3 F (36.8 C) (07/11 0731) Pulse Rate:  [58-108] 65 (07/11 0731) Resp:  [13-29] 23 (07/11 0731) BP: (83-147)/(32-114) 132/41 (07/11 0731) SpO2:  [89 %-100 %] 93 % (07/11 0731) Arterial Line BP: (114-204)/(41-195) 204/195 (07/10 1100) FiO2 (%):  [40 %-50 %] 40 % (07/11 0731) Weight:  [93.5 kg] 93.5 kg (07/11 0300) Last BM Date: 11/06/20 (Reported to be "just mucous")  Intake/Output from previous day: 07/10 0701 - 07/11 0700 In: 1070.2 [I.V.:213; NG/GT:756.7; IV Piggyback:100.5] Out: 3574 [Urine:674; Emesis/NG output:2900] Intake/Output this shift: Total I/O In: -  Out: 85 [Urine:85]  General: alert, not oriented.   Heart: regular, rate, and rhythm.   Resp: slightly tachypneic GI - soft, non distended, non tender.  Incisions c/d/i  Lab Results:  Recent Labs    11/06/20 0432 11/07/20 0504  WBC 20.1* 18.3*  HGB 7.7* 7.2*  HCT 26.0* 24.8*  PLT 177 206   BMET Recent Labs    11/06/20 0432 11/07/20 0504  NA 141 144  K 4.6 4.4  CL 107 106  CO2 28 28  GLUCOSE 196* 147*  BUN 20 25*  CREATININE 1.18* 1.34*  CALCIUM 8.6* 9.4   PT/INR No results for input(s): LABPROT, INR in the last 72 hours. ABG No results for input(s): PHART, HCO3 in the last 72 hours.  Invalid input(s): PCO2, PO2  Studies/Results: DG Chest 1 View  Result Date: 11/07/2020 CLINICAL DATA:  Endotracheal intubation EXAM: CHEST  1 VIEW COMPARISON:  11/04/2020 FINDINGS: There has been interval retraction of enteric feeding tube, tip now positioned near the gastroesophageal junction. Support apparatus is otherwise unchanged. Unchanged diffuse interstitial heterogeneous airspace opacity throughout the lungs and retrocardiac atelectasis or consolidation. Cardiomegaly. IMPRESSION: 1. There  has been interval retraction of enteric feeding tube, tip now positioned near the gastroesophageal junction. Recommend advancement. Support apparatus is otherwise unchanged. 2.  Otherwise unchanged AP portable examination. Electronically Signed   By: Eddie Candle M.D.   On: 11/07/2020 08:28   MR BRAIN WO CONTRAST  Result Date: 11/05/2020 CLINICAL DATA:  Mental status change. EXAM: MRI HEAD WITHOUT CONTRAST TECHNIQUE: Multiplanar, multiecho pulse sequences of the brain and surrounding structures were obtained without intravenous contrast. COMPARISON:  MRI October 22, 2013. FINDINGS: Brain: No acute infarction, hemorrhage, hydrocephalus, extra-axial collection or mass lesion. Mild for age T2/FLAIR hyperintensities within the white matter, most likely related to chronic microvascular ischemic disease. Small dilated perivascular spaces in bilateral basal ganglia. Vascular: Major arterial flow voids are maintained skull base. Skull and upper cervical spine: Normal marrow signal. Hyperostosis frontalis. Sinuses/Orbits: Moderate sphenoid sinus and ethmoid air cell mucosal thickening. Fluid layering in the pharynx. Other: Moderate bilateral mastoid effusions. IMPRESSION: 1. No evidence of acute intracranial abnormality. 2. Mild for age chronic microvascular ischemic disease. 3. Moderate bilateral mastoid effusions and paranasal sinus disease, potentially related to the patient's intubated status. Electronically Signed   By: Margaretha Sheffield MD   On: 11/05/2020 15:13    Anti-infectives: Anti-infectives (From admission, onward)    Start     Dose/Rate Route Frequency Ordered Stop   11/04/20 1245  cefTRIAXone (ROCEPHIN) 2 g in sodium chloride 0.9 % 100 mL IVPB        2 g 200 mL/hr over 30 Minutes Intravenous Every 24 hours 11/04/20 1148 11/06/20  1302   11/02/20 1000  cefTRIAXone (ROCEPHIN) 2 g in sodium chloride 0.9 % 100 mL IVPB  Status:  Discontinued        2 g 200 mL/hr over 30 Minutes Intravenous Every 24 hours  11/01/20 1257 11/02/20 0856   11/02/20 1000  doxycycline (VIBRAMYCIN) 100 mg in sodium chloride 0.9 % 250 mL IVPB  Status:  Discontinued        100 mg 125 mL/hr over 120 Minutes Intravenous Every 12 hours 11/01/20 1257 11/02/20 0856   11/02/20 1000  ceFEPIme (MAXIPIME) 2 g in sodium chloride 0.9 % 100 mL IVPB  Status:  Discontinued        2 g 200 mL/hr over 30 Minutes Intravenous Every 12 hours 11/02/20 0856 11/04/20 1148   11/01/20 0915  levofloxacin (LEVAQUIN) IVPB 750 mg  Status:  Discontinued        750 mg 100 mL/hr over 90 Minutes Intravenous Every 24 hours 11/01/20 0827 11/01/20 1257   11/01/20 0730  levofloxacin (LEVAQUIN) IVPB 500 mg  Status:  Discontinued        500 mg 100 mL/hr over 60 Minutes Intravenous Every 24 hours 11/01/20 0631 11/01/20 1257   10/13/2020 2100  ciprofloxacin (CIPRO) IVPB 400 mg        400 mg 200 mL/hr over 60 Minutes Intravenous Every 12 hours 10/19/2020 1258 10/21/2020 2148   10/23/2020 0600  ciprofloxacin (CIPRO) IVPB 400 mg        400 mg 200 mL/hr over 60 Minutes Intravenous On call to O.R. 10/23/2020 0547 10/20/2020 0847       Assessment/Plan: s/p Procedure(s): XI ROBOTIC ASSISTED DISTAL GASTRECTOMY (N/A)     Pathology from surgery 6/28, low grade dysplasia.  No cancer and 13 negative nodes.     Aspiration with loss of vital signs was witnessed and patient immediately received CPR and drugs with ROSC after a few minutes.  10/31/20 Shock - resolved VDRF - resolved, but patient still a bit tachypneic.   Neuro- Pt still not interactive, but following simple commands and alert which is an improvement.  Echo shows preserved ejection fraction and only grade 2 diastolic dysfunction.  Moderately elevated PA pressures.     Ct today to evaluate abdominal anatomy.   Presumed functional gastric outlet obstruction.  Upper gi earlier was negative for leak or for obstruction.  Keep NPO for now. Cortrack is out of the stomach into the small intestine.  At goal rate on  tube feeds   SMOG enema today.    LOS: 13 days    [name] 11/07/2020

## 2020-11-07 NOTE — Progress Notes (Signed)
LTM EEG running - no initial skin breakdown - push button tested - neuro notified.  

## 2020-11-07 NOTE — Progress Notes (Signed)
LTM maint complete - [facility] monitored, Event button test confirmed by [facility].   

## 2020-11-07 NOTE — Progress Notes (Signed)
Enema not successful, no stool noted. Dr. Tamala Julian made aware and at bedside. Pt with stridor at rest, orders placed for BIpap and ABG.

## 2020-11-07 NOTE — Procedures (Signed)
Extubation Procedure Note  Patient Details:   Name: SHERMIKA BALTHASER DOB: 1945/06/24 MRN: [id number]   Airway Documentation:    Vent end date: 11/07/20 Vent end time: 1140   Evaluation  O2 sats: stable throughout Complications: Complications of Stridor Patient did tolerate procedure well. Bilateral Breath Sounds: Clear, Diminished   No  Pt extubated to 5 L [place] with RN at bedside. No cuff leak noted, Dr. Tamala Julian made aware. Dr. Tamala Julian said to still extubate. Pt developed stridor, MD notified. Racemic epi given.   Cathie Olden 11/07/2020, 11:53 AM

## 2020-11-07 NOTE — Procedures (Signed)
Cortrak  Person Inserting Tube:  [name], RD Tube Type:  Cortrak - 43 inches Tube Size:  10 Tube Location:  Right nare Initial Placement:  Stomach Secured by: Bridle Technique Used to Measure Tube Placement:  Marking at nare/corner of mouth Cortrak Secured At:  68 cm  Cortrak Tube Team Note:  Cortrak pulled out upon extubation. Consult received to re-place Cortrak feeding tube.   X-ray is required, abdominal x-ray has been ordered by the Cortrak team. Please confirm tube placement before using the Cortrak tube.   If the tube becomes dislodged please keep the tube and contact the Cortrak team at [email or web address] (password [email or web address]) for replacement.  If after hours and replacement cannot be delayed, place a NG tube and confirm placement with an abdominal x-ray.    Leah Single MS, RD, LDN, CNSC Clinical Nutrition Pager listed in [place]

## 2020-11-07 NOTE — Consult Note (Addendum)
Neurology Consultation Reason for Consult: Seizure-like activities postcardiac arrest Referring Physician: Dr. Ina Homes  CC: Seizure-like activity postcardiac arrest  History is obtained from: Chart review as patient is altered  HPI: [name] is a 75 y.o. female who was admitted on 10/15/2020 for elective robotic distal gastrectomy for suspected adenocarcinoma.  Gastrectomy Showed Low-Grade Dysplasia. On 11/01/2020, she suffered cardiac arrest. ROSC was achieved in about 6 minutes.  On 11/02/2020 she was noted of right arm twitching.  Routine EEG at that time showed continuous generalized 3 to 5 Hz theta-delta slowing admixed with intermittent generalized 15 to 18 Hz beta activity suggestive of severe diffuse encephalopathy.  No seizures or epileptiform discharges were seen.  MRI brain was performed on 11/06/2018 which did not show any acute abnormality.  Per review of critical care note, patient was also reportedly following commands per RN on 11/05/2020.  She is continue to have involuntary movements.  Therefore neurology was consulted for further management.  ROS: Unable to obtain due to intubation   Past Medical History:  Diagnosis Date   Anxiety    CAD (coronary artery disease)     (Nonobstructive coronary artery disease 40% LAD stenosis)  2011   Cancer (HCC)    gastric   CHF (congestive heart failure) (HCC)    Chronic kidney disease    STAGE 4 KIDNEY DISEASE   Depression    DJD (degenerative joint disease)    DM (diabetes mellitus) (HCC)    GERD (gastroesophageal reflux disease)    H/O hiatal hernia    HTN (hypertension)    Hypothyroidism    OSA (obstructive sleep apnea)    doesn't wear CPAP   Stroke ([place])     Family History  Problem Relation Age of Onset   Breast cancer Mother    Breast cancer Sister    Social History:  reports that she has never smoked. She has never used smokeless tobacco. She reports that she does not drink alcohol and does not use  drugs.   Medications Prior to Admission  Medication Sig Dispense Refill Last Dose   ALPRAZolam (XANAX) 0.5 MG tablet Take 0.25-0.5 mg by mouth 3 (three) times daily as needed for anxiety.   Past Month   amLODipine (NORVASC) 5 MG tablet Take 5 mg by mouth daily.   10/07/2020 at 0330   aspirin 81 MG tablet Take 81 mg by mouth daily.   10/21/2020   atorvastatin (LIPITOR) 20 MG tablet Take 20 mg by mouth daily at 6 PM.    10/24/2020   buPROPion (WELLBUTRIN XL) 150 MG 24 hr tablet Take 150 mg by mouth daily.   2 10/24/2020   Cholecalciferol (VITAMIN D) 50 MCG (2000 UT) tablet Take 2,000 Units by mouth daily.   Past Week   doxepin (SINEQUAN) 25 MG capsule Take 25 mg by mouth at bedtime.   10/24/2020   FLUoxetine (PROZAC) 40 MG capsule Take 40 mg by mouth every morning.   10/23/2020 at 0330   fluticasone (FLONASE) 50 MCG/ACT nasal spray Place 2 sprays into both nostrils daily as needed for allergies.    Past Week   furosemide (LASIX) 40 MG tablet Take 40 mg by mouth daily.   Past Week   gabapentin (NEURONTIN) 300 MG capsule Take 600 mg by mouth daily.  2 09/28/2020 at 0330   glipiZIDE (GLUCOTROL) 5 MG tablet Take 5 mg by mouth 2 (two) times daily.   10/24/2020   levETIRAcetam (KEPPRA) 500 MG tablet Take 1 tablet (500  mg total) by mouth 2 (two) times daily. (Patient taking differently: Take 500 mg by mouth every 12 (twelve) hours.) 180 tablet 3 10/24/2020 at 0300   levothyroxine (SYNTHROID, LEVOTHROID) 137 MCG tablet Take 137 mcg by mouth daily before breakfast.   10/14/2020 at 0330   metoprolol (LOPRESSOR) 50 MG tablet Take 100 mg by mouth at bedtime.   10/24/2020   mirtazapine (REMERON) 45 MG tablet Take 45 mg by mouth at bedtime.   10/24/2020   pantoprazole (PROTONIX) 40 MG tablet Take 40 mg by mouth every morning.   10/24/2020 at 0330   potassium chloride SA (KLOR-CON) 20 MEQ tablet Take 20 mEq by mouth every morning.   Past Week   rOPINIRole (REQUIP) 0.25 MG tablet Take 0.25 mg by mouth at bedtime.    10/24/2020   RYBELSUS 7 MG TABS Take 7 mg by mouth daily.   10/24/2020   traMADol (ULTRAM) 50 MG tablet Take 100 mg by mouth at bedtime.   10/24/2020   traZODone (DESYREL) 50 MG tablet Take 50-100 mg by mouth at bedtime.   10/24/2020   valsartan (DIOVAN) 320 MG tablet Take 320 mg by mouth every morning.   10/24/2020      Exam: Current vital signs: BP (!) 132/42   Pulse 61   Temp 98.3 F (36.8 C) (Axillary)   Resp 17   Ht 4\' 11"  (1.499 m)   Wt 93.5 kg   SpO2 98%   BMI 41.63 kg/m  Vital signs in last 24 hours: Temp:  [98 F (36.7 C)-100 F (37.8 C)] 98.3 F (36.8 C) (07/11 0731) Pulse Rate:  [58-108] 61 (07/11 0900) Resp:  [13-29] 17 (07/11 0900) BP: (83-147)/(32-114) 132/42 (07/11 0900) SpO2:  [89 %-100 %] 98 % (07/11 0900) Arterial Line BP: (114-204)/(41-195) 204/195 (07/10 1100) FiO2 (%):  [40 %-50 %] 40 % (07/11 0731) Weight:  [93.5 kg] 93.5 kg (07/11 0300)   Physical Exam  Constitutional: Appears well-developed and well-nourished.  Psych: Unable to assess due to intubation Eyes: No scleral injection HENT: No OP obstrucion Head: Normocephalic.  Cardiovascular: Normal rate and regular rhythm.  Respiratory: Intubated, CTAB  GI: Soft.  No distension. There is no tenderness.  Skin: Warm, mild edema Neuro: Awake, alert, follows simple one-step commands like closing eyes, raising arms and legs, opens eyes spontaneously, PERRLA, corneal reflex intact, EOMI, no apparent facial asymmetry although difficult to assess due to intubation, antigravity strength in all 4 extremities  I have reviewed labs in epic and the results pertinent to this consultation are: CBC:  Recent Labs  Lab 11/03/20 0421 11/04/20 0553 11/05/20 0406 11/06/20 0432 11/07/20 0504  WBC 23.5* 21.0*   < > 20.1* 18.3*  NEUTROABS 19.9* 16.2*  --   --   --   HGB 7.4* 7.8*   < > 7.7* 7.2*  HCT 24.5* 24.8*   < > 26.0* 24.8*  MCV 95.3 92.5   < > 98.1 98.4  PLT 168 172   < > 177 206   < > = values in this  interval not displayed.    Basic Metabolic Panel:  Lab Results  Component Value Date   NA 144 11/07/2020   K 4.4 11/07/2020   CO2 28 11/07/2020   GLUCOSE 147 (H) 11/07/2020   BUN 25 (H) 11/07/2020   CREATININE 1.34 (H) 11/07/2020   CALCIUM 9.4 11/07/2020   GFRNONAA 41 (L) 11/07/2020   GFRAA 58 (L) 02/08/2017   Lipid Panel:  Lab Results  Component Value Date  [place] 50 10/22/2013   HgbA1c:  Lab Results  Component Value Date   HGBA1C 6.1 (H) 10/19/2020   Urine Drug Screen: No results found for: LABOPIA, COCAINSCRNUR, LABBENZ, AMPHETMU, THCU, LABBARB  Alcohol Level No results found for: ETH   I have reviewed the images obtained:  MRI brain without contrast 11/05/2020: No acute abnormality  ASSESSMENT/PLAN: 75 year old female status post in-hospital cardiac arrest on 7/6/202, ROSC achieved in about 6 minutes.  Now noted to have intermittent involuntary movements  Status post cardiac arrest Intermittent twitching -Routine EEG did not show any evidence of epileptogenicity.  Per RN, patient intermittently tries to raise both arms, happens every few minutes.  Recommendations: -We will obtain video EEG for characterization of spells - Will not chnage antiseizure medications unless there is any evidence of ictal-interictal abnormality on EEG -Patient is already awake and following commands.  MRI brain did not show any evidence of anoxic/hypoxic brain injury.  Even with it is possible the patient had mild hypoxic injury which is not visible on MRI, I anticipate patient will have favorable neurological outcome as she is already awake and following commands -Management of rest of comorbidities per primary team  I have spent a total of  125 minutes with the patient reviewing hospital notes,  test results, labs and examining the patient as well as establishing an assessment and plan that was discussed personally with the patient's husband at bedside, RN at bedside and Dr. Tamala Julian.  > 50%  of time was spent in direct patient care.    Thank you for allowing [place] to participate in the care of this patient. If you have any further questions, please contact  me or neurohospitalist.   Zeb Comfort Epile[facility] neuro[facility]ist

## 2020-11-07 NOTE — Progress Notes (Addendum)
Discussed case with [place]: check CT A/P with limited oral contrast to evaluate patency of gastric outlet.  Having some stridor post extubation but does wake up, some seems volitional.  Will give some steroids, racemic epi and monitor closely.  Still fairly high risk of reintubation, husband at bedside updated.  Erskine Emery MD PCCM  CT A/P reviewed, contrast reaches small bowel, should be fine to do trickle feeds Bibasilar infiltrates noted sequelae of prior aspiration

## 2020-11-07 NOTE — Progress Notes (Addendum)
Nutrition Follow Up  DOCUMENTATION CODES:   Not applicable  INTERVENTION:   Multiple attempts to advance Cortrak to post pyloric position were unsuccessful. Once CTA resulted, plan Cortrak tube advancement per diagnostic radiology.   Once able restart:  -Vital 1.5 @ 20 ml/hr via Cortrak -Advance per surgery to goal rate of 55 ml/hr (1320 ml) -ProSource TF 45 ml BID  At goal rate TF provides: 2060 kcals, 111 grams protein, 1008 ml free water.   NUTRITION DIAGNOSIS:   Increased nutrient needs related to post-op healing as evidenced by estimated needs.  Ongoing  GOAL:   Patient will meet greater than or equal to 90% of their needs  Not meeting   MONITOR:   Weight trends, Labs, Vent status, I & O's, Skin, TF tolerance  REASON FOR ASSESSMENT:   Ventilator    ASSESSMENT:   Patient with PMH significant for OSA, HTN, CAD, seizures, TIA, DM, CKD, HLD, and recently found HGD polyp of gastric antrum with focal intramucosal adenocarcinoma. Presents this admission for elective robotic distal gastrectomy.  6/28- s/p robotic distal gastrectomy 7/05- PEA arrest, intubated, post pyloric Cortrak placed, trickles started  7/07- Vital 1.5 at goal rate 7/10- Cortrak pulled per patient   Remains on levophed. Extubated this am. Following commands intermittently. Multiple attempts made to pass Cortrak tube through pylorus into the duodenum were unsuccessful. Given copious OG output, suspect patient will not tolerate gastric feedings. Plan for CTA to evaluate possible GOO and reason for no BM since surgery (has ongoing strong bowel regimen and suppositories). Once resulted, plan advancement of Cortrak per diagnostic radiology tomorrow. If unable to restart TF in the next 24-48 hrs, consider TPN.   Admission weight: 102.1 kg  Current weight: 93.5 kg   UOP: 674 ml x 24 hrs  OG: 2900 ml x 24 hrs   Drips: levophed  Medications: decadron, colace, SS novolog, 10 mg reglan TID, miralax,  senokot Labs: CBG 117-138  Diet Order:   Diet Order             Diet NPO time specified  Diet effective now                   EDUCATION NEEDS:   Not appropriate for education at this time  Skin:  Skin Assessment: Skin Integrity Issues: Skin Integrity Issues:: Incisions, Stage II Stage II: sacrum Incisions: abdomen  Last BM:  7/10-small and mucous like    Height:   Ht Readings from Last 1 Encounters:  10/15/2020 4\' 11"  (1.499 m)    Weight:   Wt Readings from Last 1 Encounters:  11/07/20 93.5 kg    BMI:  Body mass index is 41.63 kg/m.  Estimated Nutritional Needs:   Kcal:  1800-2000 kcal  Protein:  100-120 grams  Fluid:  >/= 1.8 L/day  Mariana Single MS, RD, LDN, CNSC Clinical Nutrition Pager listed in [place]

## 2020-11-07 NOTE — Progress Notes (Signed)
NAME:  [name], MRN:  [id number], DOB:  May 13, 1945, LOS: 24 ADMISSION DATE:  10/02/2020, CONSULTATION DATE:  7/5 REFERRING MD:  Barry Dienes, CHIEF COMPLAINT:  Cardiac arrest   History of Present Illness:  [name] is a 75 y.o. 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 She had procedure done 6/28.  Pathology was negative for malignancy but showed LGD with negative nodes.   Early AM 7/5, she had wheezing so received a breathing treatment.  Shortly thereafter, she was found with emesis all over bed and floor.  She had gurgling sounds and altered LOC.  She ended up having loss of pulses with initial rhythm asystole.  She received 1mg  epinephrine before PEA for which she received a 2nd dose of epinephrine.  She then had ROSC after 6 minutes.  She was intubated during the code.   She was transferred to [facility] ICU where PCCM was asked to assist with vent management.  Pertinent  Medical History  OSA DM2 Hypertension CAD Allergic rhinitis   Significant Hospital Events: Including procedures, antibiotic start and stop dates in addition to other pertinent events   6/28 > admit, robotic distal gastrectomy,  7/5 > 6 minute code (asystole then PEA), severe hypoxemic respiratory failure and shock 7/5 levaquin x1 7/5 ceftriaxone > 7/6 7/5 doxycycline > 7/6 7/6 cefepime >  7/6 EEG > generalized slowing suggestive of severe diffuse encephalopathy non-specific in etiology  7/6 TTE > LVEF 65-70%, grade II diastolic dysfunction, RSVP elevated 58.1 mmHg, trivial MR  Interim History / Subjective:  7/10: mri yesterday without acute intracranial abnormality. Still with poor response. Unfortunately also increased vent settings. Increasing levothyroxine dosing today  7/9: no acute events again. Tolerating 40-13ml/hr tf. Good uop with lasix again yesterday.  Now only 1.4 L positive for hospital stay. Remains on norepi infusion. Awaiting stability on laying flat for MRI for prognostication, will increase sedation in short term to hopefully facilitate this. TSH is mildly elevated despite replacement but doubtful accounting for degree of encephalopathy. Per RN was able to follow commands this am weakly. Not for me yet.  7/8: no acute events reported overnight. Wbc stable to improved, tmax 98.7 on cpap 14/5 this am for >2 hr but back to full support with some desat. Will get MRI once able to lay flat. ? Improvement in mental status vs involuntary movements that appear periodically purposeful  Objective   Blood pressure (!) 119/41, pulse 64, temperature 98.5 F (36.9 C), temperature source Axillary, resp. rate 16, height 4\' 11"  (1.499 m), weight 93.5 kg, SpO2 96 %.    Vent Mode: PRVC FiO2 (%):  [40 %-60 %] 40 % Set Rate:  [22 bmp] 22 bmp Vt Set:  [360 mL] 360 mL PEEP:  [5 cmH20-8 cmH20] 5 cmH20 Plateau Pressure:  [16 cmH20-20 cmH20] 17 cmH20   Intake/Output Summary (Last 24 hours) at 11/07/2020 0701 Last data filed at 11/07/2020 0626 Gross per 24 hour  Intake 1070.17 ml  Output 3574 ml  Net -2503.83 ml    Filed Weights   11/05/20 0407 11/06/20 0433 11/07/20 0300  Weight: 96.3 kg 97.5 kg 93.5 kg    Examination: Constitutional: no distress in bed shaking intermittently  Eyes: pupils equal, reactive Ears, nose, mouth, and throat: ETT minimal secretions Cardiovascular: RRR, ext warm Respiratory: diminished bases, triggering vent Gastrointestinal: soft, hypoactive BS, incisions look CDI Skin: No rashes, normal turgor  Neurologic: withdraws x 4, opens eyes to voice, not following commands Psychiatric: RASS -1  BUN/Cr up WBC improved H/h down slightly On minimal levophed  Resolved Hospital Problem list     Assessment & Plan:  Septic shock from aspiration pneumonia, lingering vasoplegia from sedation and third spacing suspected- echo  benign Acute hypoxemic respiratory failure secondary to aspiration pneumonia- culture neg, completed course of rocephin Cardiac arrest presumed secondary to aspiration event Ongoing encephalopathy question anoxic vs. ICU delirium- complicated by shaking movements, initial EEG neg.  Ammonia, TSH benign (well TSH high but synthroid increased during stay).  Also getting benzos which cloud diagnostic picture. Post op gastrectomy with lingering ileus- ongoing issue, abdomen soft  - Continue midodrine - Change fentanyl to PRN - DC versed PRN - Check video EEG long term to make sure these myoclonic jerks are not seizure in origin - Continue vent support, ongoing ileus and mental status preclude extubation - May need to bring neurology on board - PS or SIMV may help vent synchrony - Post op care per CCS   Patient critically ill due to resp failure, shock, encephalopathy Interventions to address this today pressor, sedation/vent titration Risk of deterioration without these interventions is high  I personally spent 45 minutes providing critical care not including any separately billable procedures  Erskine Emery MD [facility]  Prefer epic messenger for cross cover needs If after hours, please call E-link

## 2020-11-08 ENCOUNTER — Inpatient Hospital Stay (HOSPITAL_COMMUNITY): Payer: Medicare PPO

## 2020-11-08 DIAGNOSIS — I469 Cardiac arrest, cause unspecified: Secondary | ICD-10-CM | POA: Diagnosis not present

## 2020-11-08 LAB — BASIC METABOLIC PANEL
Anion gap: 5 (ref 5–15)
BUN: 28 mg/dL — ABNORMAL HIGH (ref 8–23)
CO2: 28 mmol/L (ref 22–32)
Calcium: 9 mg/dL (ref 8.9–10.3)
Chloride: 110 mmol/L (ref 98–111)
Creatinine, Ser: 1.2 mg/dL — ABNORMAL HIGH (ref 0.44–1.00)
GFR, Estimated: 47 mL/min — ABNORMAL LOW (ref >60)
Glucose, Bld: 152 mg/dL — ABNORMAL HIGH (ref 70–99)
Potassium: 5.4 mmol/L — ABNORMAL HIGH (ref 3.5–5.1)
Sodium: 143 mmol/L (ref 135–145)

## 2020-11-08 LAB — GLUCOSE, CAPILLARY
Glucose-Capillary: 124 mg/dL — ABNORMAL HIGH (ref 70–99)
Glucose-Capillary: 138 mg/dL — ABNORMAL HIGH (ref 70–99)
Glucose-Capillary: 145 mg/dL — ABNORMAL HIGH (ref 70–99)
Glucose-Capillary: 146 mg/dL — ABNORMAL HIGH (ref 70–99)
Glucose-Capillary: 146 mg/dL — ABNORMAL HIGH (ref 70–99)
Glucose-Capillary: 180 mg/dL — ABNORMAL HIGH (ref 70–99)

## 2020-11-08 LAB — CBC
HCT: 26 % — ABNORMAL LOW (ref 36.0–46.0)
Hemoglobin: 7.7 g/dL — ABNORMAL LOW (ref 12.0–15.0)
MCH: 29.7 pg (ref 26.0–34.0)
MCHC: 29.6 g/dL — ABNORMAL LOW (ref 30.0–36.0)
MCV: 100.4 fL — ABNORMAL HIGH (ref 80.0–100.0)
Platelets: 232 10*3/uL (ref 150–400)
RBC: 2.59 MIL/uL — ABNORMAL LOW (ref 3.87–5.11)
RDW: 17.2 % — ABNORMAL HIGH (ref 11.5–15.5)
WBC: 10.1 10*3/uL (ref 4.0–10.5)
nRBC: 0 % (ref 0.0–0.2)

## 2020-11-08 LAB — T3, FREE: T3, Free: 0.6 pg/mL — ABNORMAL LOW (ref 2.0–4.4)

## 2020-11-08 LAB — POTASSIUM: Potassium: 4.5 mmol/L (ref 3.5–5.1)

## 2020-11-08 LAB — PHOSPHORUS: Phosphorus: 4.8 mg/dL — ABNORMAL HIGH (ref 2.5–4.6)

## 2020-11-08 LAB — MAGNESIUM: Magnesium: 2.3 mg/dL (ref 1.7–2.4)

## 2020-11-08 MED ORDER — POLYETHYLENE GLYCOL 3350 17 G PO PACK
17.0000 g | PACK | Freq: Two times a day (BID) | ORAL | Status: DC
  Administered 2020-11-08 – 2020-11-10 (×4): 17 g
  Filled 2020-11-08 (×4): qty 1

## 2020-11-08 MED ORDER — SODIUM POLYSTYRENE SULFONATE 15 GM/60ML PO SUSP
15.0000 g | Freq: Once | ORAL | Status: AC
  Administered 2020-11-08: 15 g via RECTAL
  Filled 2020-11-08: qty 60

## 2020-11-08 MED ORDER — SODIUM CHLORIDE 0.9 % IV SOLN
250.0000 mg | Freq: Two times a day (BID) | INTRAVENOUS | Status: DC
  Administered 2020-11-08 – 2020-11-21 (×27): 250 mg via INTRAVENOUS
  Filled 2020-11-08 (×30): qty 2.5

## 2020-11-08 MED ORDER — PROSOURCE TF PO LIQD
45.0000 mL | Freq: Two times a day (BID) | ORAL | Status: DC
  Administered 2020-11-08 – 2020-11-10 (×5): 45 mL
  Filled 2020-11-08 (×5): qty 45

## 2020-11-08 MED ORDER — SODIUM ZIRCONIUM CYCLOSILICATE 5 G PO PACK
5.0000 g | PACK | Freq: Once | ORAL | Status: DC
  Filled 2020-11-08: qty 1

## 2020-11-08 MED ORDER — LIDOCAINE VISCOUS HCL 2 % MT SOLN
15.0000 mL | Freq: Once | OROMUCOSAL | Status: AC
  Administered 2020-11-08: 2 mL via OROMUCOSAL
  Filled 2020-11-08: qty 15

## 2020-11-08 MED ORDER — IOHEXOL 300 MG/ML  SOLN
50.0000 mL | Freq: Once | INTRAMUSCULAR | Status: AC | PRN
  Administered 2020-11-08: 10 mL

## 2020-11-08 MED ORDER — DOCUSATE SODIUM 50 MG/5ML PO LIQD
100.0000 mg | Freq: Two times a day (BID) | ORAL | Status: DC
  Administered 2020-11-08 – 2020-11-10 (×4): 100 mg
  Filled 2020-11-08 (×4): qty 10

## 2020-11-08 MED ORDER — VITAL 1.5 CAL PO LIQD
1000.0000 mL | ORAL | Status: DC
  Administered 2020-11-08: 1000 mL
  Filled 2020-11-08: qty 1000

## 2020-11-08 NOTE — Progress Notes (Addendum)
Subjective: No clinical seizure-like episodes overnight.  Patient was extubated.  Able to answer questions today.  No concerns per RN.  ROS: negative except above  Examination  Vital signs in last 24 hours: Temp:  [96.7 F (35.9 C)-97.8 F (36.6 C)] 97.8 F (36.6 C) (07/12 1111) Pulse Rate:  [44-125] 62 (07/12 1200) Resp:  [14-29] 19 (07/12 1200) BP: (116-187)/(28-85) 116/62 (07/12 1200) SpO2:  [90 %-100 %] 96 % (07/12 1200) FiO2 (%):  [40 %] 40 % (07/12 0741) Weight:  [93.2 kg] 93.2 kg (07/12 0359)  General: lying in bed, NAD CVS: pulse-normal rate and rhythm RS: breathing comfortably, symmetric chest expansion bilaterally Extremities: normal, warm Neuro: AOx3, follows commands, able to name all objects, no evidence of aphasia, cranial nerves II to XII grossly intact, antigravity strength in all 4 extremities  Basic Metabolic Panel: Recent Labs  Lab 11/02/20 0428 11/02/20 2000 11/03/20 0421 11/04/20 0929 11/04/20 1755 11/05/20 0406 11/05/20 1535 11/06/20 0432 11/07/20 0504 11/07/20 1841 11/07/20 1957 11/08/20 0217  NA 131*  --    < > 138   < > 140 142 141 144 144 145 143  K 3.7  --    < > 3.2*   < > 3.3* 3.7 4.6 4.4 4.8 5.0 5.4*  CL 99  --    < > 106   < > 101 104 107 106  --   --  110  CO2 21*  --    < > 28   < > 31 30 28 28   --   --  28  GLUCOSE 164*  --    < > 138*   < > 161* 130* 196* 147*  --   --  152*  BUN 17  --    < > 23   < > 23 22 20  25*  --   --  28*  CREATININE 1.30*  --    < > 1.30*   < > 1.32* 1.22* 1.18* 1.34*  --   --  1.20*  CALCIUM 8.2*  --    < > 8.4*   < > 8.6* 8.7* 8.6* 9.4  --   --  9.0  MG 1.2* 2.6*  --  1.9  --  1.7  --  2.1  --   --   --  2.3  PHOS 3.8  --   --   --   --   --   --   --   --   --   --  4.8*   < > = values in this interval not displayed.    CBC: Recent Labs  Lab 11/02/20 0428 11/03/20 0421 11/04/20 0553 11/05/20 0406 11/06/20 0432 11/07/20 0504 11/07/20 1841 11/07/20 1957 11/08/20 0217  WBC 33.8* 23.5* 21.0*  18.6* 20.1* 18.3*  --   --  10.1  NEUTROABS 31.8* 19.9* 16.2*  --   --   --   --   --   --   HGB 8.7* 7.4* 7.8* 7.7* 7.7* 7.2* 9.5* 7.8* 7.7*  HCT 27.9* 24.5* 24.8* 24.8* 26.0* 24.8* 28.0* 23.0* 26.0*  MCV 93.3 95.3 92.5 93.6 98.1 98.4  --   --  100.4*  PLT 218 168 172 170 177 206  --   --  232     Coagulation Studies: No results for input(s): LABPROT, INR in the last 72 hours.  Imaging No new brain imaging overnight  ASSESSMENT AND PLAN: 75 year old female status post in-hospital cardiac arrest on 7/6/202, ROSC achieved  in about 6 minutes.  She was noted to have intermittent involuntary movements which improved after sedation was weaned off   Status post cardiac arrest Intermittent twitching -Routine EEG did not show any evidence of epileptogenicity.  Per RN, patient intermittently tries to raise both arms, happens every few minutes.  No events were captured on long-term EEG.  But, because these movements only happened while patient was intubated/sedated, it is possible that these were voluntary movements while sedation was being titrated down   Recommendations: -Discontinue video EEG as no evidence of ictal-interictal abnormality -As EEG did not show any interictal and patient did not have any definitive seizures, will reduce Keppra to 250 mg twice daily.   -Follow-up with neurology in 3 months.  If patient remains seizure-free, Keppra can likely be discontinued at that time -Patient has been following commands and does not have any focal neurological deficits at this poin.  Therefore I do not think patient suffered any significant anoxic/hypoxic brain injury. -Management of rest of comorbidities per primary team   I have spent a total of  25 minutes with the patient reviewing hospital notes,  test results, labs and examining the patient as well as establishing an assessment and plan that was discussed personally with the patient and RN at bedside.  > 50% of time was spent in direct  patient care.    Thank you for allowing [place] to participate in the care of this patient. If you have any further questions, please contact  me or neurohospitalist.   Zeb Comfort Epilepsy [facility] neurohospitalist

## 2020-11-08 NOTE — Progress Notes (Addendum)
OT Cancellation Note  Patient Details Name: [name] MRN: [id number] DOB: April 30, 1946   Cancelled Treatment:    Reason Eval/Treat Not Completed: Patient at procedure or test/ unavailable, RN reports taking pt to radiology at this time. Will follow and see as able.   Jolaine Artist, OT Acute Rehabilitation Services Pager [phone] Office [phone]   Delight Stare 11/08/2020, 9:05 AM

## 2020-11-08 NOTE — Progress Notes (Signed)
Nutrition Follow Up  DOCUMENTATION CODES:   Not applicable  INTERVENTION:   Tube feeding:  -Vital 1.5 @ 20 ml/hr via Cortrak -Advance by 10 ml Q6 hours to goal rate of 55 ml/hr (1320 ml) -ProSource TF 45 ml BID  At goal rate TF provides: 2060 kcals, 111 grams protein, 1008 ml free water.   NUTRITION DIAGNOSIS:   Increased nutrient needs related to post-op healing as evidenced by estimated needs.  Ongoing  GOAL:   Patient will meet greater than or equal to 90% of their needs  Progressing   MONITOR:   Weight trends, Labs, Vent status, I & O's, Skin, TF tolerance  REASON FOR ASSESSMENT:   Ventilator    ASSESSMENT:   Patient with PMH significant for OSA, HTN, CAD, seizures, TIA, DM, CKD, HLD, and recently found HGD polyp of gastric antrum with focal intramucosal adenocarcinoma. Presents this admission for elective robotic distal gastrectomy.  6/28- s/p robotic distal gastrectomy 7/05- PEA arrest, intubated, post pyloric Cortrak placed, trickles started  7/07- Vital 1.5 at goal rate 7/10- Cortrak pulled per patient  7/11- Extubated, Cortrak replaced gastric, CTA negative for gastric distention/leak  Required BiPAP for short amount of time this am. Able to answer some RD questions. Denies abdominal pain or nausea. Cortrak advanced per diagnostic radiology to "proximal portion of small bowel." Okay to advance tube feeding back to goal rate per surgery.   Potassium high this am, kayexalate given. Has not had significant BM since surgery. Has been on bowel regimen, had suppositories, and had enema yesterday. Continue aggressive treatment.   Admission weight: 102.1 kg  Current weight: 93.2 kg   UOP: 800 ml x 24 hrs   Medications: decadron, SS novolog, 10 mg reglan TID Labs: Phosphorus 4.8 (H)   Diet Order:   Diet Order             Diet NPO time specified  Diet effective now                   EDUCATION NEEDS:   Not appropriate for education at this  time  Skin:  Skin Assessment: Skin Integrity Issues: Skin Integrity Issues:: Incisions, Stage II Stage II: sacrum Incisions: abdomen  Last BM:  7/10-small and mucous like    Height:   Ht Readings from Last 1 Encounters:  10/15/2020 4\' 11"  (1.499 m)    Weight:   Wt Readings from Last 1 Encounters:  11/08/20 93.2 kg    BMI:  Body mass index is 41.5 kg/m.  Estimated Nutritional Needs:   Kcal:  1800-2000 kcal  Protein:  100-120 grams  Fluid:  >/= 1.8 L/day  Mariana Single MS, RD, LDN, CNSC Clinical Nutrition Pager listed in [place]

## 2020-11-08 NOTE — Progress Notes (Signed)
Placed patient on BIPAP for HS use. °

## 2020-11-08 NOTE — Progress Notes (Signed)
[facility] Progress Note Patient Name: [name] DOB: [birth year] MRN: [id number]   Date of Service  11/08/2020  HPI/Events of Note  K+ 5.4  eICU Interventions  Lokelma 5 gm enterally x 1 ordered,        Frederik Pear 11/08/2020, 3:55 AM

## 2020-11-08 NOTE — Progress Notes (Addendum)
SLP Cancellation Note  Patient Details Name: [name] MRN: [id number] DOB: May 31, 1945   Cancelled treatment:       Reason Eval/Treat Not Completed: Patient at procedure or test/unavailable - going to radiology. Per discussion with nursing, pt will likely benefit from additional time before swallow evaluation so will f/u on subsequent date.     Osie Bond., M.A. [facility] Pager [phone] Office [phone]  11/08/2020, 9:11 AM

## 2020-11-08 NOTE — Progress Notes (Signed)
NAME:  [name], MRN:  [id number], DOB:  1945/08/12, LOS: 59 ADMISSION DATE:  10/12/2020, CONSULTATION DATE:  7/5 REFERRING MD:  Barry Dienes, CHIEF COMPLAINT:  Cardiac arrest   History of Present Illness:  [name] is a 75 y.o. 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 She had procedure done 6/28.  Pathology was negative for malignancy but showed LGD with negative nodes.   Early AM 7/5, she had wheezing so received a breathing treatment.  Shortly thereafter, she was found with emesis all over bed and floor.  She had gurgling sounds and altered LOC.  She ended up having loss of pulses with initial rhythm asystole.  She received 1mg  epinephrine before PEA for which she received a 2nd dose of epinephrine.  She then had ROSC after 6 minutes.  She was intubated during the code.   She was transferred to [facility] ICU where PCCM was asked to assist with vent management.  Pertinent  Medical History  OSA DM2 Hypertension CAD Allergic rhinitis   Significant Hospital Events: Including procedures, antibiotic start and stop dates in addition to other pertinent events   6/28 > admit, robotic distal gastrectomy,  7/5 > 6 minute code (asystole then PEA), severe hypoxemic respiratory failure and shock 7/5 levaquin x1 7/5 ceftriaxone > 7/6 7/5 doxycycline > 7/6 7/6 cefepime >  7/6 EEG > generalized slowing suggestive of severe diffuse encephalopathy non-specific in etiology  7/6 TTE > LVEF 65-70%, grade II diastolic dysfunction, RSVP elevated 58.1 mmHg, trivial MR 7/12 extubated  Interim History / Subjective:  No events. On BIPAP Following commands Some coughing-induced bradycardia  Objective   Blood pressure (!) 148/55, pulse (!) 44, temperature 97.8 F (36.6 C), temperature source Axillary, resp. rate 16, height 4\' 11"  (1.499 m), weight 93.2  kg, SpO2 100 %.    Vent Mode: BIPAP FiO2 (%):  [40 %] 40 % Set Rate:  [15 bmp-22 bmp] 15 bmp Vt Set:  [360 mL] 360 mL PEEP:  [5 cmH20] 5 cmH20 Plateau Pressure:  [14 cmH20] 14 cmH20   Intake/Output Summary (Last 24 hours) at 11/08/2020 0730 Last data filed at 11/08/2020 0600 Gross per 24 hour  Intake 127.05 ml  Output 962 ml  Net -834.95 ml    Filed Weights   11/06/20 0433 11/07/20 0300 11/08/20 0359  Weight: 97.5 kg 93.5 kg 93.2 kg    Examination: Constitutional: no distress  Eyes: EOMI, pupils equal Ears, nose, mouth, and throat: BIPAP in place with good seal, cortrak in place Cardiovascular: slightly brady, systolic murmur, ext warm Respiratory: rhonci at bases, no accessory muscle use Gastrointestinal: soft, hypoactive bS Skin: No rashes, normal turgor Neurologic: moves all 4 ext to command, profoundly weak Psychiatric: RASS 0  BMP stable WBC improved H/H stable  Resolved Hospital Problem list     Assessment & Plan:  Septic shock from aspiration pneumonia, lingering vasoplegia from sedation and third spacing suspected- echo benign Acute hypoxemic respiratory failure secondary to aspiration pneumonia- culture neg, completed course of rocephin, now extubated, lingering tenuous respiratory status due to weakness and mild stridor Cardiac arrest presumed secondary to aspiration event Ongoing encephalopathy- improved after extubation; suspect related to sedation, critical illness; could have some element of anoxic injury, exact extent will take time to tell Post op gastrectomy- no signs of gastric outlet obstruction on CT, maybe mild gastroparesis, not much stool in colon either nor is  there much air so would not really call this an ileus AKI- stable/improved related to fluid shifts Hyperkalemia- mild, limited in treatment options until cortrak repositioned but honestly should be fine to give lokelma to NG; recheck around noon, give insulin PRN  - Can probably DC LTVEEG  monitoring, appreciate neuro help - Hold sedating meds - IR to hopefully help get her cortrak postpyloric for tube feed advancement - Short course steroids for stridor - Wean off BIPAP - Up to chair, PT/OT/SLP consults appreciated - Keep in ICU at least one more day due to tenuous respiratory status  Patient critically ill due to resp failure Interventions to address this today BIPAP wean Risk of deterioration without these interventions is high  I personally spent 32 minutes providing critical care not including any separately billable procedures  Erskine Emery MD [facility]  Prefer epic messenger for cross cover needs If after hours, please call E-link

## 2020-11-08 NOTE — Evaluation (Signed)
Physical Therapy Evaluation Patient Details Name: [name] MRN: [id number] DOB: 02-20-1946 Today's Date: 11/08/2020   History of Present Illness  Pt is a 75 y/o female admitted 6/28 for elective robotic distal gastrectomy, complicated by 6 minute code (asystole then PEA) on 7/5.  EEG 7/6 with severe diffuse encephalopathy. 7/10 MRI negative.  7/12 extubated. PMH: obesity, hiatal hernia, CHF, CAD, h/o CVA.  Clinical Impression  Pt admitted with above diagnosis and presents to PT with functional limitations due to deficits listed below (See PT problem list). Pt needs skilled PT to maximize independence and safety to allow discharge to CIR for further rehab before eventual return home. Expect pt will make steady progress if she remains medically stable.     Follow Up Recommendations CIR    Equipment Recommendations  Wheelchair (measurements PT)    Recommendations for Other Services Rehab consult     Precautions / Restrictions Precautions Precautions: Fall Precaution Comments: cortrak Restrictions Weight Bearing Restrictions: No      Mobility  Bed Mobility Overal bed mobility: Needs Assistance Bed Mobility: Rolling;Sidelying to Sit Rolling: Min assist Sidelying to sit: Mod assist;+2 for safety/equipment       General bed mobility comments: Assist to elevate trunk into sitting, bring legs off EOB, and bring hips to EOB    Transfers Overall transfer level: Needs assistance Equipment used: Ambulation equipment used Transfers: Sit to/from Stand Sit to Stand: Min assist;+2 physical assistance         General transfer comment: Assist to bring hips up and for balance. Used Stedy for bed to chair.  Ambulation/Gait                Stairs            Wheelchair Mobility    Modified Rankin (Stroke Patients Only)       Balance Overall balance assessment: Needs assistance Sitting-balance support: No upper extremity supported;Feet supported Sitting  balance-[name]: Fair Sitting balance - Comments: .   Standing balance support: Bilateral upper extremity supported;During functional activity Standing balance-[name]: Poor Standing balance comment: Stedy and min assist for static standing                             Pertinent Vitals/Pain Pain Assessment: Faces Faces Pain Scale: No hurt    Home Living Family/patient expects to be discharged to:: Private residence Living Arrangements: Spouse/significant other Available Help at Discharge: Family;Available 24 hours/day Type of Home: House Home Access: Stairs to enter Entrance Stairs-Rails: Psychiatric nurse of Steps: 1 Home Layout: One level Home Equipment: Walker - 2 wheels;Cane - single point;Shower seat - built in;Hand held shower head;Grab bars - tub/shower      Prior Function Level of Independence: Independent with assistive device(s)         Comments: uses cane for mobility, independent ADLs     Hand Dominance   Dominant Hand: Right    Extremity/Trunk Assessment   Upper Extremity Assessment Upper Extremity Assessment: Defer to OT evaluation (.)    Lower Extremity Assessment Lower Extremity Assessment: Generalized weakness       Communication   Communication: No difficulties  Cognition Arousal/Alertness: Awake/alert Behavior During Therapy: WFL for tasks assessed/performed Overall Cognitive Status: Impaired/Different from baseline Area of Impairment: Orientation;Memory;Following commands;Awareness;Safety/judgement;Problem solving;Attention                 Orientation Level: Disoriented to;Time Current Attention Level: Sustained Memory: Decreased short-term memory;Decreased recall  of precautions Following Commands: Follows one step commands consistently;Follows one step commands with increased time;Follows multi-step commands inconsistently Safety/Judgement: Decreased awareness of safety;Decreased awareness of  deficits Awareness: Emergent Problem Solving: Slow processing;Decreased initiation;Difficulty sequencing;Requires verbal cues General Comments: pt disoriented to time, follows simple commands but slow processing and decreased problem sovling noted      General Comments General comments (skin integrity, edema, etc.): VSS on 4L    Exercises     Assessment/Plan    PT Assessment Patient needs continued PT services  PT Problem List Decreased strength;Decreased activity tolerance;Decreased balance;Decreased mobility;Decreased cognition;Obesity       PT Treatment Interventions DME instruction;Gait training;Functional mobility training;Therapeutic activities;Therapeutic exercise;Balance training;Cognitive remediation;Patient/family education    PT Goals (Current goals can be found in the Care Plan section)  Acute Rehab PT Goals Patient Stated Goal: get home PT Goal Formulation: With patient Time For Goal Achievement: 11/22/20 Potential to Achieve Goals: Good    Frequency Min 3X/week   Barriers to discharge        Co-evaluation PT/OT/SLP Co-Evaluation/Treatment: Yes Reason for Co-Treatment: For patient/therapist safety;Complexity of the patient's impairments (multi-system involvement) PT goals addressed during session: Mobility/safety with mobility;Balance OT goals addressed during session: ADL's and self-care       AM-PAC PT "6 Clicks" Mobility  Outcome Measure Help needed turning from your back to your side while in a flat bed without using bedrails?: A Little Help needed moving from lying on your back to sitting on the side of a flat bed without using bedrails?: A Lot Help needed moving to and from a bed to a chair (including a wheelchair)?: Total Help needed standing up from a chair using your arms (e.g., wheelchair or bedside chair)?: Total Help needed to walk in hospital room?: Total Help needed climbing 3-5 steps with a railing? : Total 6 Click Score: 9    End of  Session Equipment Utilized During Treatment: Gait belt;Oxygen Activity Tolerance: Patient limited by fatigue Patient left: in chair;with call bell/phone within reach;Other (comment) (with OT present) Nurse Communication: Mobility status;Need for lift equipment PT Visit Diagnosis: Other abnormalities of gait and mobility (R26.89);Muscle weakness (generalized) (M62.81)    Time: 2330-0762 PT Time Calculation (min) (ACUTE ONLY): 21 min   Charges:   PT Evaluation $PT Eval Moderate Complexity: [place] Pager [phone] Office [place] 11/08/2020, 2:58 PM

## 2020-11-08 NOTE — Progress Notes (Signed)
PT Cancellation Note  Patient Details Name: [name] MRN: [id number] DOB: 1945/10/30   Cancelled Treatment:    Reason Eval/Treat Not Completed: Patient at procedure or test/unavailable. Pt going to radiology. Will check back later.   [place] 11/08/2020, [place] Pager [phone] Office [phone]

## 2020-11-08 NOTE — Progress Notes (Signed)
NO skin breakdown. LTM D/C

## 2020-11-08 NOTE — Progress Notes (Signed)
[place] Progress Note Patient Name: [name] DOB: Dec 31, 1945 MRN: [id number]   Date of Service  11/08/2020  HPI/Events of Note  Patient does not have enteral access for [facility].  eICU Interventions  Kayexalate 15 gm rectally x 1.        Kerry Kass Syncere Eble 11/08/2020, 4:12 AM

## 2020-11-08 NOTE — Evaluation (Signed)
Occupational Therapy Evaluation Patient Details Name: [name] MRN: [id number] DOB: [birth year] Today's Date: 11/08/2020    History of Present Illness Pt is a 75 y/o female admitted 6/28 for elective robotic distal gastrectomy, complicated by 6 minute code (asystole then PEA) on 7/5.  EEG 7/6 with severe diffuse encephalopathy. 7/10 MRI negative.  7/12 extubated. PMH: obesity, hiatal hernia, CHF, CAD, h/o CVA.   Clinical Impression   Patient admitted for above and presenting with problem list below, including impaired balance, generalized weakness, decreased activity tolerance, R UE edema and BUE tremors, and impaired cognition.  Pt disoriented to year, able to follow simple commands with increased time.  She fatigues easily but VSS on 4L [place].  She requires up to total assist for ADLs, min assist +2 for sit to stand transfers (using stedy to transfer to recliner).  Believe she will benefit from continued OT services while admitted and after dc at CIR level to optimize independence and return to PLOF with ADLs, mobility.     Follow Up Recommendations  CIR;Supervision/Assistance - 24 hour    Equipment Recommendations  3 in 1 bedside commode    Recommendations for Other Services       Precautions / Restrictions Precautions Precautions: Fall Precaution Comments: cortrak Restrictions Weight Bearing Restrictions: No      Mobility Bed Mobility Overal bed mobility: Needs Assistance Bed Mobility: Rolling;Sidelying to Sit Rolling: Min assist Sidelying to sit: Mod assist;+2 for safety/equipment       General bed mobility comments: mod assist for trunk and LB support to EOB, increased time and cueing for technique    Transfers Overall transfer level: Needs assistance Equipment used: Ambulation equipment used Transfers: Sit to/from Stand Sit to Stand: Min assist;+2 physical assistance         General transfer comment: min assist +2 to power up using stedy pulling with  BUEs, assist to place R UE due to weakness    Balance Overall balance assessment: Needs assistance Sitting-balance support: No upper extremity supported;Feet supported Sitting balance-[name]: Fair Sitting balance - Comments: min guard to min assist for safety   Standing balance support: Bilateral upper extremity supported;During functional activity Standing balance-[name]: Poor Standing balance comment: relies on BUE and external support                           ADL either performed or assessed with clinical judgement   ADL Overall ADL's : Needs assistance/impaired     Grooming: Minimal assistance;Sitting           Upper Body Dressing : Moderate assistance;Sitting   Lower Body Dressing: Total assistance;+2 for physical assistance;+2 for safety/equipment;Sit to/from stand   Toilet Transfer: Minimal assistance;+2 for safety/equipment;+2 for physical assistance Toilet Transfer Details (indicate cue type and reason): simulated to recliner, using stedy Toileting- Clothing Manipulation and Hygiene: Total assistance;+2 for physical assistance;+2 for safety/equipment;Sit to/from stand       Functional mobility during ADLs: Moderate assistance;+2 for physical assistance;+2 for safety/equipment General ADL Comments: pt limited by weakness, balance, decreased activity tolearnce     Vision         Perception     Praxis      Pertinent Vitals/Pain Pain Assessment: Faces Faces Pain Scale: No hurt     Hand Dominance Right   Extremity/Trunk Assessment Upper Extremity Assessment Upper Extremity Assessment: Generalized weakness (R UE edema)   Lower Extremity Assessment Lower Extremity Assessment: Defer to PT evaluation  Communication Communication Communication: No difficulties   Cognition Arousal/Alertness: Awake/alert Behavior During Therapy: WFL for tasks assessed/performed Overall Cognitive Status: Impaired/Different from baseline Area  of Impairment: Orientation;Memory;Following commands;Awareness;Safety/judgement;Problem solving;Attention                 Orientation Level: Disoriented to;Time Current Attention Level: Sustained Memory: Decreased short-term memory;Decreased recall of precautions Following Commands: Follows one step commands consistently;Follows one step commands with increased time;Follows multi-step commands inconsistently Safety/Judgement: Decreased awareness of safety;Decreased awareness of deficits Awareness: Emergent Problem Solving: Slow processing;Decreased initiation;Difficulty sequencing;Requires verbal cues General Comments: pt disoriented to time, follows simple commands but slow processing and decreased problem sovling noted   General Comments  VSS on 4L [place]    Exercises     Shoulder Instructions      Home Living Family/patient expects to be discharged to:: Private residence Living Arrangements: Spouse/significant other Available Help at Discharge: Family;Available 24 hours/day Type of Home: House Home Access: Stairs to enter [employer] of Steps: 1 Entrance Stairs-Rails: Right;Left Home Layout: One level     Bathroom Shower/Tub: Occupational psychologist: Standard     Home Equipment: Environmental consultant - 2 wheels;Cane - single point;Shower seat - built in;Hand held shower head;Grab bars - tub/shower          Prior Functioning/Environment Level of Independence: Independent with assistive device(s)        Comments: uses cane for mobility, independent ADLs        OT Problem List: Decreased strength;Decreased activity tolerance;Impaired balance (sitting and/or standing)      OT Treatment/Interventions: Self-care/ADL training;Therapeutic exercise;Energy conservation;DME and/or AE instruction;Therapeutic activities;Cognitive remediation/compensation;Patient/family education;Balance training    OT Goals(Current goals can be found in the care plan section) Acute  Rehab OT Goals Patient Stated Goal: get home OT Goal Formulation: With patient Time For Goal Achievement: 11/22/20 Potential to Achieve Goals: Good  OT Frequency: Min 2X/week   Barriers to D/C:            Co-evaluation PT/OT/SLP Co-Evaluation/Treatment: Yes Reason for Co-Treatment: For patient/therapist safety;To address functional/ADL transfers;Complexity of the patient's impairments (multi-system involvement)   OT goals addressed during session: ADL's and self-care      AM-PAC OT "6 Clicks" Daily Activity     Outcome Measure Help from another person eating meals?: Total Help from another person taking care of personal grooming?: A Little Help from another person toileting, which includes using toliet, bedpan, or urinal?: Total Help from another person bathing (including washing, rinsing, drying)?: A Lot Help from another person to put on and taking off regular upper body clothing?: A Lot Help from another person to put on and taking off regular lower body clothing?: Total 6 Click Score: 10   End of Session Equipment Utilized During Treatment: Gait belt;Oxygen;Other (comment) (stedy) Nurse Communication: Mobility status  Activity Tolerance: Patient tolerated treatment well Patient left: in chair;with call bell/phone within reach;with nursing/sitter in room  OT Visit Diagnosis: Other abnormalities of gait and mobility (R26.89);Muscle weakness (generalized) (M62.81);Other symptoms and signs involving cognitive function                Time: 5284-1324 OT Time Calculation (min): 25 min Charges:  OT General Charges $OT Visit: 1 Visit OT Evaluation $OT Eval Moderate Complexity: 1 Mod  Jolaine Artist, OT Acute Rehabilitation Services Pager [phone] Office [phone]   Delight Stare 11/08/2020, 1:27 PM

## 2020-11-08 NOTE — Progress Notes (Signed)
Rehab Admissions Coordinator Note:  Patient was screened by Cleatrice Burke for appropriateness for an Inpatient Acute Rehab Consult per therapy recs. .  At this time, we are recommending Inpatient Rehab consult. Please place order for rehab consult if you would like [place] to assess for possible admit. Please advise.  Cleatrice Burke RN MSN 11/08/2020, 3:21 PM  I can be reached at [phone].

## 2020-11-08 NOTE — Progress Notes (Signed)
14 Days Post-Op   Subjective/Chief Complaint: Remains extubated and answering some questions. Did require a period of BiPAP.  CT reviewed.  No evidence of gastric distention or leak.     Objective: Vital signs in last 24 hours: Temp:  [96.7 F (35.9 C)-97.8 F (36.6 C)] 97.8 F (36.6 C) (07/12 1111) Pulse Rate:  [44-125] 62 (07/12 1200) Resp:  [14-29] 19 (07/12 1200) BP: (116-187)/(28-85) 116/62 (07/12 1200) SpO2:  [90 %-100 %] 96 % (07/12 1200) FiO2 (%):  [40 %] 40 % (07/12 0741) Weight:  [93.2 kg] 93.2 kg (07/12 0359) Last BM Date: 11/06/20  Intake/Output from previous day: 07/11 0701 - 07/12 0700 In: 127.1 [I.V.:27.1; IV Piggyback:100] Out: 962 [Urine:812] Intake/Output this shift: Total I/O In: -  Out: 140 [Urine:140]  General: alert, not oriented.  Answers questions.   Heart: regular, rate, and rhythm.   Resp: slightly tachypneic GI - soft, non distended, non tender.  Incisions c/d/i  Lab Results:  Recent Labs    11/07/20 0504 11/07/20 1841 11/07/20 1957 11/08/20 0217  WBC 18.3*  --   --  10.1  HGB 7.2*   < > 7.8* 7.7*  HCT 24.8*   < > 23.0* 26.0*  PLT 206  --   --  232   < > = values in this interval not displayed.   BMET Recent Labs    11/07/20 0504 11/07/20 1841 11/07/20 1957 11/08/20 0217 11/08/20 1144  NA 144   < > 145 143  --   K 4.4   < > 5.0 5.4* 4.5  CL 106  --   --  110  --   CO2 28  --   --  28  --   GLUCOSE 147*  --   --  152*  --   BUN 25*  --   --  28*  --   CREATININE 1.34*  --   --  1.20*  --   CALCIUM 9.4  --   --  9.0  --    < > = values in this interval not displayed.   PT/INR No results for input(s): LABPROT, INR in the last 72 hours. ABG Recent Labs    11/07/20 1841 11/07/20 1957  PHART 7.325* 7.324*  HCO3 28.9* 28.3*    Studies/Results: CT ABDOMEN PELVIS WO CONTRAST  Result Date: 11/07/2020 CLINICAL DATA:  Abdominal distension, assess gastric outlet EXAM: CT ABDOMEN AND PELVIS WITHOUT CONTRAST TECHNIQUE:  Multidetector CT imaging of the abdomen and pelvis was performed following the standard protocol without IV contrast. Sagittal and coronal MPR images reconstructed from axial data set. Patient drank dilute oral contrast for exam COMPARISON:  12/12/2009 FINDINGS: Lower chest: Bibasilar infiltrates, atelectasis, and small pleural effusions Hepatobiliary: Distended gallbladder with dependent density which could represent vicarious excretion of contrast or tiny dependent calculi. Liver unremarkable. Pancreas: Atrophic pancreas without mass Spleen: Spleen unremarkable.  Small adjacent splenule. Adrenals/Urinary Tract: Adrenal glands normal. Peripelvic cyst mid LEFT kidney 4.0 x 3.1 cm increased in size since previous study. No additional renal mass or hydronephrosis. No ureteral calcification or dilatation. Bladder decompressed by Foley catheter. Stomach/Bowel: Nasogastric tube in stomach. Prior distal gastrectomy with gastrojejunostomy. Bowel wall thickening at anastomosis. Oral contrast however passes into the proximal jejunum. Filling defect likely small amount of food debris in stomach. Remaining bowel loops unremarkable. Vascular/Lymphatic: Atherosclerotic calcifications aorta, iliac arteries, visceral arteries, coronary arteries. Aorta normal caliber. No adenopathy. Reproductive: Uterus surgically absent. Nonvisualization of ovaries. Other: Free fluid in pelvis, small amount.  Scattered stranding along umbilicus. No free air or hernia. Musculoskeletal: Osseous demineralization. Chronic lytic lesion L3 vertebral body unchanged since 2011 question vertebral hemangioma. IMPRESSION: Post distal gastrectomy with gastrojejunostomy anastomosis in LEFT upper quadrant. Bowel wall thickening at the gastrojejunostomy anastomosis though no evidence of outlet obstruction is identified; this could represent sequela of surgery, edema, or peri anastomotic ulcer, recommend correlation with endoscopy. Small amount of nonspecific free  pelvic fluid. Increased size of peripelvic cyst mid LEFT kidney. Bibasilar pulmonary infiltrates, atelectasis and small pleural effusions. Cannot exclude tiny dependent calculi within gallbladder. Aortic Atherosclerosis (ICD10-I70.0). Electronically Signed   By: Lavonia Dana M.D.   On: 11/07/2020 18:42   DG Chest 1 View  Result Date: 11/07/2020 CLINICAL DATA:  Endotracheal intubation EXAM: CHEST  1 VIEW COMPARISON:  11/04/2020 FINDINGS: There has been interval retraction of enteric feeding tube, tip now positioned near the gastroesophageal junction. Support apparatus is otherwise unchanged. Unchanged diffuse interstitial heterogeneous airspace opacity throughout the lungs and retrocardiac atelectasis or consolidation. Cardiomegaly. IMPRESSION: 1. There has been interval retraction of enteric feeding tube, tip now positioned near the gastroesophageal junction. Recommend advancement. Support apparatus is otherwise unchanged. 2.  Otherwise unchanged AP portable examination. Electronically Signed   By: Eddie Candle M.D.   On: 11/07/2020 08:28   DG Abd 1 View  Result Date: 11/08/2020 CLINICAL DATA:  Post gastrectomy. Cortrak advancement under fluoroscopic guidance. EXAM: DG NASO G TUBE PLC W/FL-NO RAD; ABDOMEN - 1 VIEW CONTRAST:  10 mL Omnipaque 300 injected through the tube. FLUOROSCOPY TIME:  Fluoroscopy Time:  42 seconds Radiation Exposure Index (if provided by the fluoroscopic device): Number of Acquired Spot Images: 0 COMPARISON:  None. FINDINGS: The Cortrak tube was advanced under fluoroscopic guidance through the stomach and into the proximal small bowel. Contrast was injected to verify placement. IMPRESSION: Advancement of the Cortrak tube into the proximal small bowel. Electronically Signed   By: Rolm Baptise M.D.   On: 11/08/2020 11:14   DG Abd Portable 1V  Result Date: 11/07/2020 CLINICAL DATA:  Enteric tube placement EXAM: PORTABLE ABDOMEN - 1 VIEW COMPARISON:  11/07/2020 FINDINGS: Feeding tube is  seen coursing below diaphragm with distal tip coiled within the gastric body. Previously seen NG tube is no longer evident. There are overlying cardiac leads. Patchy opacities within the visualized lung fields. Cardiomegaly. IMPRESSION: Feeding tube coiled within the gastric body. Electronically Signed   By: Davina Poke D.O.   On: 11/07/2020 14:52   DG Abd Portable 1V  Result Date: 11/07/2020 CLINICAL DATA:  NG tube placement. EXAM: PORTABLE ABDOMEN - 1 VIEW COMPARISON:  11/01/2020 FINDINGS: NG tube tip is in the mid stomach. The feeding tube is coiled in the proximal stomach. Bowel gas pattern is nonspecific with no evidence to suggest bowel obstruction. Degenerative changes noted lower lumbar spine. IMPRESSION: NG tube tip is in the mid stomach. Feeding tube is coiled in the proximal stomach. Electronically Signed   By: Misty Stanley M.D.   On: 11/07/2020 10:52   DG Addison Bailey G Tube Plc W/Fl-No Rad  Result Date: 11/08/2020 CLINICAL DATA:  Post gastrectomy. Cortrak advancement under fluoroscopic guidance. EXAM: DG NASO G TUBE PLC W/FL-NO RAD; ABDOMEN - 1 VIEW CONTRAST:  10 mL Omnipaque 300 injected through the tube. FLUOROSCOPY TIME:  Fluoroscopy Time:  42 seconds Radiation Exposure Index (if provided by the fluoroscopic device): Number of Acquired Spot Images: 0 COMPARISON:  None. FINDINGS: The Cortrak tube was advanced under fluoroscopic guidance through the stomach and into the proximal  small bowel. Contrast was injected to verify placement. IMPRESSION: Advancement of the Cortrak tube into the proximal small bowel. Electronically Signed   By: Rolm Baptise M.D.   On: 11/08/2020 11:14   Overnight EEG with video  Result Date: 11/08/2020 Lora Havens, MD     11/08/2020 12:47 PM Patient Name: [name] MRN: [id number] Epilepsy Attending: Lora Havens Referring Physician/Provider: Dr. Ina Homes Duration: 11/07/2020 0910 11/08/2020 [id number] Patient history: 75 year old female status post  in-hospital cardiac arrest on 7/6/202, ROSC achieved in about 6 minutes.  Now noted to have intermittent involuntary movements.  EEG to evaluate for seizures Level of alertness: Awake, asleep AEDs during EEG study: LEV Technical aspects: This EEG study was done with scalp electrodes positioned according to the 10-20 International system of electrode placement. Electrical activity was acquired at a sampling rate of 500Hz  and reviewed with a high frequency filter of 70Hz  and a low frequency filter of 1Hz . EEG data were recorded continuously and digitally stored. Description: During awake state, no clear posterior dominant rhythm was seen. Sleep was characterized by sleep spindles (12 to 14 Hz), maximal frontocentral region. EEG showed continuous generalized 3 to 5 Hz theta-delta slowing.  Hyperventilation and photic stimulation were not performed.   ABNORMALITY - Continuous slow, generalized IMPRESSION: This study is suggestive of moderate diffuse encephalopathy, nonspecific etiology. No seizures or epileptiform discharges were seen throughout the recording. Priyanka [name]    Anti-infectives: Anti-infectives (From admission, onward)    Start     Dose/Rate Route Frequency Ordered Stop   11/04/20 1245  cefTRIAXone (ROCEPHIN) 2 g in sodium chloride 0.9 % 100 mL IVPB        2 g 200 mL/hr over 30 Minutes Intravenous Every 24 hours 11/04/20 1148 11/06/20 1302   11/02/20 1000  cefTRIAXone (ROCEPHIN) 2 g in sodium chloride 0.9 % 100 mL IVPB  Status:  Discontinued        2 g 200 mL/hr over 30 Minutes Intravenous Every 24 hours 11/01/20 1257 11/02/20 0856   11/02/20 1000  doxycycline (VIBRAMYCIN) 100 mg in sodium chloride 0.9 % 250 mL IVPB  Status:  Discontinued        100 mg 125 mL/hr over 120 Minutes Intravenous Every 12 hours 11/01/20 1257 11/02/20 0856   11/02/20 1000  ceFEPIme (MAXIPIME) 2 g in sodium chloride 0.9 % 100 mL IVPB  Status:  Discontinued        2 g 200 mL/hr over 30 Minutes Intravenous Every  12 hours 11/02/20 0856 11/04/20 1148   11/01/20 0915  levofloxacin (LEVAQUIN) IVPB 750 mg  Status:  Discontinued        750 mg 100 mL/hr over 90 Minutes Intravenous Every 24 hours 11/01/20 0827 11/01/20 1257   11/01/20 0730  levofloxacin (LEVAQUIN) IVPB 500 mg  Status:  Discontinued        500 mg 100 mL/hr over 60 Minutes Intravenous Every 24 hours 11/01/20 0631 11/01/20 1257   10/11/2020 2100  ciprofloxacin (CIPRO) IVPB 400 mg        400 mg 200 mL/hr over 60 Minutes Intravenous Every 12 hours 10/10/2020 1258 10/01/2020 2148   10/10/2020 0600  ciprofloxacin (CIPRO) IVPB 400 mg        400 mg 200 mL/hr over 60 Minutes Intravenous On call to O.R. 10/18/2020 0547 10/11/2020 0847       Assessment/Plan: s/p Procedure(s): XI ROBOTIC ASSISTED DISTAL GASTRECTOMY (N/A)     Pathology from surgery 6/28, low grade dysplasia.  No  cancer and 13 negative nodes.     Aspiration with loss of vital signs was witnessed and patient immediately received CPR and drugs with ROSC after a few minutes.  10/31/20 Shock - resolved VDRF - resolved, but patient still a bit tachypneic.  BiPap as needed.  ID - aspiration pneumonia- was on cefepime then ceftriaxone.   Neuro- Pt still not interactive, but following simple commands and alert which is an improvement. Neurology weaning Keppra.  CV - Echo shows preserved ejection fraction and only grade 2 diastolic dysfunction.  Moderately elevated PA pressures.   Core track is in small intestine, so ok to advance tube feeds to goal.   Speech will need to assess swallowing prior to oral intake.     LOS: 14 days    Stark Klein 11/08/2020

## 2020-11-08 NOTE — Procedures (Addendum)
Patient Name: [name]  MRN: [id number]  Epilepsy Attending: Lora Havens  Referring Physician/Provider: Dr. Ina Homes Duration: 11/07/2020 0910 11/08/2020 [id number]  Patient history: 75 year old female status post in-hospital cardiac arrest on 7/6/202, ROSC achieved in about 6 minutes.  Now noted to have intermittent involuntary movements.  EEG to evaluate for seizures  Level of alertness: Awake, asleep  AEDs during EEG study: LEV  Technical aspects: This EEG study was done with scalp electrodes positioned according to the 10-20 International system of electrode placement. Electrical activity was acquired at a sampling rate of 500Hz  and reviewed with a high frequency filter of 70Hz  and a low frequency filter of 1Hz . EEG data were recorded continuously and digitally stored.   Description: During awake state, no clear posterior dominant rhythm was seen. Sleep was characterized by sleep spindles (12 to 14 Hz), maximal frontocentral region. EEG showed continuous generalized 3 to 5 Hz theta-delta slowing.  Hyperventilation and photic stimulation were not performed.     ABNORMALITY - Continuous slow, generalized  IMPRESSION: This study is suggestive of moderate diffuse encephalopathy, nonspecific etiology. No seizures or epileptiform discharges were seen throughout the recording.  [name] [name]

## 2020-11-09 ENCOUNTER — Inpatient Hospital Stay: Payer: Self-pay

## 2020-11-09 ENCOUNTER — Inpatient Hospital Stay (HOSPITAL_COMMUNITY): Payer: Medicare PPO

## 2020-11-09 ENCOUNTER — Other Ambulatory Visit: Payer: Medicare PPO

## 2020-11-09 DIAGNOSIS — I469 Cardiac arrest, cause unspecified: Secondary | ICD-10-CM | POA: Diagnosis not present

## 2020-11-09 LAB — HEPATIC FUNCTION PANEL
ALT: 28 U/L (ref 0–44)
AST: 38 U/L (ref 15–41)
Albumin: 2.2 g/dL — ABNORMAL LOW (ref 3.5–5.0)
Alkaline Phosphatase: 86 U/L (ref 38–126)
Bilirubin, Direct: 1.1 mg/dL — ABNORMAL HIGH (ref 0.0–0.2)
Indirect Bilirubin: 0.9 mg/dL (ref 0.3–0.9)
Total Bilirubin: 2 mg/dL — ABNORMAL HIGH (ref 0.3–1.2)
Total Protein: 6.5 g/dL (ref 6.5–8.1)

## 2020-11-09 LAB — BASIC METABOLIC PANEL
Anion gap: 9 (ref 5–15)
BUN: 38 mg/dL — ABNORMAL HIGH (ref 8–23)
CO2: 26 mmol/L (ref 22–32)
Calcium: 9 mg/dL (ref 8.9–10.3)
Chloride: 108 mmol/L (ref 98–111)
Creatinine, Ser: 1.15 mg/dL — ABNORMAL HIGH (ref 0.44–1.00)
GFR, Estimated: 49 mL/min — ABNORMAL LOW (ref >60)
Glucose, Bld: 209 mg/dL — ABNORMAL HIGH (ref 70–99)
Potassium: 4.5 mmol/L (ref 3.5–5.1)
Sodium: 143 mmol/L (ref 135–145)

## 2020-11-09 LAB — GLUCOSE, CAPILLARY
Glucose-Capillary: 117 mg/dL — ABNORMAL HIGH (ref 70–99)
Glucose-Capillary: 138 mg/dL — ABNORMAL HIGH (ref 70–99)
Glucose-Capillary: 143 mg/dL — ABNORMAL HIGH (ref 70–99)
Glucose-Capillary: 157 mg/dL — ABNORMAL HIGH (ref 70–99)
Glucose-Capillary: 183 mg/dL — ABNORMAL HIGH (ref 70–99)
Glucose-Capillary: 214 mg/dL — ABNORMAL HIGH (ref 70–99)

## 2020-11-09 LAB — CBC
HCT: 27.7 % — ABNORMAL LOW (ref 36.0–46.0)
Hemoglobin: 8.1 g/dL — ABNORMAL LOW (ref 12.0–15.0)
MCH: 29.1 pg (ref 26.0–34.0)
MCHC: 29.2 g/dL — ABNORMAL LOW (ref 30.0–36.0)
MCV: 99.6 fL (ref 80.0–100.0)
Platelets: 318 10*3/uL (ref 150–400)
RBC: 2.78 MIL/uL — ABNORMAL LOW (ref 3.87–5.11)
RDW: 17.2 % — ABNORMAL HIGH (ref 11.5–15.5)
WBC: 12.5 10*3/uL — ABNORMAL HIGH (ref 4.0–10.5)
nRBC: 0 % (ref 0.0–0.2)

## 2020-11-09 LAB — MAGNESIUM: Magnesium: 2.4 mg/dL (ref 1.7–2.4)

## 2020-11-09 LAB — PHOSPHORUS: Phosphorus: 3.7 mg/dL (ref 2.5–4.6)

## 2020-11-09 MED ORDER — AMLODIPINE BESYLATE 5 MG PO TABS
5.0000 mg | ORAL_TABLET | Freq: Every day | ORAL | Status: DC
  Administered 2020-11-09: 5 mg via ORAL
  Filled 2020-11-09: qty 1

## 2020-11-09 MED ORDER — LABETALOL HCL 5 MG/ML IV SOLN: 10.0000 mg | INTRAVENOUS | Status: DC | PRN

## 2020-11-09 MED ORDER — BISACODYL 10 MG RE SUPP
10.0000 mg | Freq: Every day | RECTAL | Status: DC
  Administered 2020-11-09 – 2020-11-20 (×12): 10 mg via RECTAL
  Filled 2020-11-09 (×12): qty 1

## 2020-11-09 MED ORDER — HYDRALAZINE HCL 10 MG PO TABS: 10.0000 mg | ORAL_TABLET | Freq: Four times a day (QID) | ORAL | Status: DC | PRN

## 2020-11-09 MED ORDER — HYDRALAZINE HCL 10 MG PO TABS
10.0000 mg | ORAL_TABLET | Freq: Four times a day (QID) | ORAL | Status: DC | PRN
  Administered 2020-11-09: 10 mg
  Filled 2020-11-09: qty 1
  Filled 2020-11-09: qty 2

## 2020-11-09 NOTE — Progress Notes (Signed)
   NAME:  [name], MRN:  [id number], DOB:  13-Apr-1946, LOS: 20 ADMISSION DATE:  10/24/2020, CONSULTATION DATE:  7/5 REFERRING MD:  Barry Dienes, CHIEF COMPLAINT:  Cardiac arrest   History of Present Illness:  [name] is a 75 y.o. 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 She had procedure done 6/28.  Pathology was negative for malignancy but showed LGD with negative nodes.   Early AM 7/5, she had wheezing so received a breathing treatment.  Shortly thereafter, she was found with emesis all over bed and floor.  She had gurgling sounds and altered LOC.  She ended up having loss of pulses with initial rhythm asystole.  She received 1mg  epinephrine before PEA for which she received a 2nd dose of epinephrine.  She then had ROSC after 6 minutes.  She was intubated during the code.   She was transferred to [facility] ICU where PCCM was asked to assist with vent management.  Pertinent  Medical History  OSA DM2 Hypertension CAD Allergic rhinitis   Significant Hospital Events: Including procedures, antibiotic start and stop dates in addition to other pertinent events   6/28 > admit, robotic distal gastrectomy,  7/5 > 6 minute code (asystole then PEA), severe hypoxemic respiratory failure and shock 7/5 levaquin x1 7/5 ceftriaxone > 7/6 7/5 doxycycline > 7/6 7/6 cefepime >  7/6 EEG > generalized slowing suggestive of severe diffuse encephalopathy non-specific in etiology  7/6 TTE > LVEF 65-70%, grade II diastolic dysfunction, RSVP elevated 58.1 mmHg, trivial MR 7/12 extubated  Interim History / Subjective:  Doing well. Wore BIPAP last night. Strength and mentation improving.  Objective   Blood pressure (!) 143/97, pulse 60, temperature 97.7 F (36.5 C), temperature source Axillary, resp. rate 20, height 4\' 11"  (1.499 m), weight 94.2 kg, SpO2  100 %. CVP:  [4 mmHg-6 mmHg] 4 mmHg  Vent Mode: BIPAP FiO2 (%):  [30 %-40 %] 40 % Set Rate:  [15 bmp] 15 bmp PEEP:  [5 cmH20] 5 cmH20   Intake/Output Summary (Last 24 hours) at 11/09/2020 0706 Last data filed at 11/09/2020 0600 Gross per 24 hour  Intake 561.83 ml  Output 730 ml  Net -168.17 ml    Filed Weights   11/07/20 0300 11/08/20 0359 11/09/20 0322  Weight: 93.5 kg 93.2 kg 94.2 kg    Examination: No distress Pupils equal Lungs diminished with rhonci at bases Remains weak Stridor mostly resolved Ext without edema Abdomen soft, cortrak in place  WBC up mildly Chemistries stable  Resolved Hospital Problem list     Assessment & Plan:  Septic shock from aspiration pneumonia, lingering vasoplegia from sedation and third spacing suspected- echo benign Acute hypoxemic respiratory failure secondary to aspiration pneumonia- culture neg, completed course of rocephin, now extubated, improving respiratory status as strength improves Cardiac arrest presumed secondary to aspiration event Ongoing encephalopathy- continues to improve Post op gastrectomy- cortrak now in duodenum, tolerating increased feeds AKI- stable/improved related to fluid shifts Hyperkalemia- resolved Muscular deconditoning- IPR consult recommended  - IPR consult - Appreciate continued PT/OT/SLP input - Encourage IS - Given clinical stability off vent, PCCM will be available PRN  Erskine Emery MD [facility]  Prefer epic messenger for cross cover needs If after hours, please call E-link

## 2020-11-09 NOTE — Progress Notes (Signed)
Inpatient Rehab Admissions Coordinator:   I met with Pt. To discuss potential CIR admission. Pt. Stated interest and that her husband can provide 24/7 support at d/c. MD states she will likely be ready to d/c in rehab in 4-5 days. I will hold off on opening a case with her insurance for now, as they typically take 24 hours to respond.   Clemens Catholic, [place], [place] Admissions Coordinator  [phone] ([place]) [phone] (office)

## 2020-11-09 NOTE — Evaluation (Signed)
Clinical/Bedside Swallow Evaluation Patient Details  Name: [name] MRN: [id number] Date of Birth: August 14, 1945  Today's Date: 11/09/2020 Time: SLP Start Time (ACUTE ONLY): 0900 SLP Stop Time (ACUTE ONLY): 2505 SLP Time Calculation (min) (ACUTE ONLY): 18 min  Past Medical History:  Past Medical History:  Diagnosis Date   Anxiety    CAD (coronary artery disease)     (Nonobstructive coronary artery disease 40% LAD stenosis)  2011   Cancer (HCC)    gastric   CHF (congestive heart failure) (HCC)    Chronic kidney disease    STAGE 4 KIDNEY DISEASE   Depression    DJD (degenerative joint disease)    DM (diabetes mellitus) ([place])    GERD (gastroesophageal reflux disease)    H/O hiatal hernia    HTN (hypertension)    Hypothyroidism    OSA (obstructive sleep apnea)    doesn't wear CPAP   Stroke [facility])    Past Surgical History:  Past Surgical History:  Procedure Laterality Date   ABDOMINAL HYSTERECTOMY     BIOPSY  06/17/2020   Procedure: BIOPSY;  Surgeon: Carol Ada, MD;  Location: [facility] ENDOSCOPY;  Service: Endoscopy;;   BIOPSY  07/01/2020   Procedure: BIOPSY;  Surgeon: Carol Ada, MD;  Location: [facility];  Service: Endoscopy;;   CARPAL TUNNEL RELEASE     CERVICAL [place]  06/17/2020   Procedure: ESOPHAGEAL DILATION;  Surgeon: Carol Ada, MD;  Location: [facility];  Service: Endoscopy;;   ESOPHAGOGASTRODUODENOSCOPY (EGD) WITH PROPOFOL N/A 06/17/2020   Procedure: ESOPHAGOGASTRODUODENOSCOPY (EGD) WITH PROPOFOL;  Surgeon: Carol Ada, MD;  Location: [facility];  Service: Endoscopy;  Laterality: N/A;   ESOPHAGOGASTRODUODENOSCOPY (EGD) WITH PROPOFOL N/A 07/01/2020   Procedure: ESOPHAGOGASTRODUODENOSCOPY (EGD) WITH PROPOFOL;  Surgeon: Carol Ada, MD;  Location: [facility];  Service: Endoscopy;  Laterality: N/A;   HEMOSTASIS CLIP PLACEMENT  07/01/2020   Procedure: HEMOSTASIS CLIP PLACEMENT;  Surgeon: Carol Ada, MD;  Location:  [facility];  Service: Endoscopy;;   JOINT REPLACEMENT     POLYPECTOMY  07/01/2020   Procedure: POLYPECTOMY;  Surgeon: Carol Ada, MD;  Location: [facility];  Service: Endoscopy;;   REPLACEMENT TOTAL KNEE Bilateral    SALPINGOOPHORECTOMY     SUBMUCOSAL INJECTION  07/01/2020   Procedure: SUBMUCOSAL INJECTION;  Surgeon: Carol Ada, MD;  Location: [facility];  Service: Endoscopy;;   TONSILLECTOMY     UVULOPALATOPHARYNGOPLASTY     HPI:  Pt is a 75 y/o female admitted 6/28 for elective robotic distal gastrectomy, complicated by 6 minute code (asystole then PEA) on 7/5 after episodes of emesis on CLD with concern for aspiration.  EEG 7/6 with severe diffuse encephalopathy. 7/10 MRI negative.  7/11 extubated. PMH: GERD, obesity, hiatal hernia, CHF, CAD, h/o CVA. Pt was evaluated by SLP after CVA in 2015 with no signs of oropharyngeal dysphagia but observed to have more chronic esophageal issues (s/p esophageal stretching in the past) including belching and globus.   Assessment / Plan / Recommendation Clinical Impression  Pt presents with wet vocal quality and strong coughing episodes with administratio of single ice chips, concerning for reduced airway protection. She has a h/o esophageal issues (GERD, HH, stretching), with swallowing likely further impacted by current deconditioning and prolonged intubation with resultant dysphonia. Recommend that she remain NPO for now with continued use of temporary, alternative means of nutrition. SLP will continue to follow. SLP Visit Diagnosis: Dysphagia, unspecified (R13.10)    Aspiration Risk  Severe aspiration risk  Diet Recommendation NPO;Alternative means - temporary   Medication Administration: Via alternative means    Other  Recommendations Oral Care Recommendations: Oral care QID   Follow up Recommendations Inpatient Rehab      Frequency and Duration min 2x/week  2 weeks       Prognosis Prognosis for Safe Diet Advancement: Good       Swallow Study   General HPI: Pt is a 75 y/o female admitted 6/28 for elective robotic distal gastrectomy, complicated by 6 minute code (asystole then PEA) on 7/5 after episodes of emesis on CLD with concern for aspiration.  EEG 7/6 with severe diffuse encephalopathy. 7/10 MRI negative.  7/11 extubated. PMH: GERD, obesity, hiatal hernia, CHF, CAD, h/o CVA. Pt was evaluated by SLP after CVA in 2015 with no signs of oropharyngeal dysphagia but observed to have more chronic esophageal issues (s/p esophageal stretching in the past) including belching and globus. Type of Study: Bedside Swallow Evaluation Previous Swallow Assessment: see HPI Diet Prior to this Study: NPO;NG Tube Temperature Spikes Noted: No Respiratory Status: Nasal cannula History of Recent Intubation: Yes Length of Intubations (days): 6 days Date extubated: 11/07/20 Behavior/Cognition: Cooperative Oral Cavity Assessment: Within Functional Limits Oral Care Completed by SLP: No Oral Cavity - Dentition: Edentulous Self-Feeding Abilities: Needs assist Patient Positioning: Upright in bed Baseline Vocal Quality: Low vocal intensity;Hoarse Volitional Cough: Strong Volitional Swallow: Able to elicit    Oral/Motor/Sensory Function Overall Oral Motor/Sensory Function: Within functional limits   Ice Chips Ice chips: Impaired Presentation: Spoon Pharyngeal Phase Impairments: Cough - Immediate;Wet Vocal Quality;Cough - Delayed   Thin Liquid Thin Liquid: Not tested    Nectar Thick Nectar Thick Liquid: Not tested   Honey Thick Honey Thick Liquid: Not tested   Puree Puree: Not tested   Solid     Solid: Not tested      Osie Bond., M.A. [place] Pager [phone] Office [phone]  11/09/2020,9:43 AM

## 2020-11-09 NOTE — Progress Notes (Signed)
15 Days Post-Op   Subjective/Chief Complaint: Continued improvement.  Stable off vent.  Used a bit of BiPAP again last night.  BP going up.     Objective: Vital signs in last 24 hours: Temp:  [97.6 F (36.4 C)-98.2 F (36.8 C)] 98.2 F (36.8 C) (07/13 0730) Pulse Rate:  [54-96] 66 (07/13 0909) Resp:  [16-28] 25 (07/13 0909) BP: (104-187)/(28-97) 140/65 (07/13 0909) SpO2:  [88 %-100 %] 97 % (07/13 0909) FiO2 (%):  [30 %-40 %] 40 % (07/13 0400) Weight:  [94.2 kg] 94.2 kg (07/13 0322) Last BM Date: 11/06/20  Intake/Output from previous day: 07/12 0701 - 07/13 0700 In: 561.8 [NG/GT:561.8] Out: 730 [Urine:730] Intake/Output this shift: Total I/O In: -  Out: 125 [Urine:125]  General: alert, oriented.  Heart: regular, rate, and rhythm.   Resp: slightly tachypneic GI - soft, non distended, non tender.  Incisions c/d/i  Lab Results:  Recent Labs    11/08/20 0217 11/09/20 0308  WBC 10.1 12.5*  HGB 7.7* 8.1*  HCT 26.0* 27.7*  PLT 232 318   BMET Recent Labs    11/08/20 0217 11/08/20 1144 11/09/20 0308  NA 143  --  143  K 5.4* 4.5 4.5  CL 110  --  108  CO2 28  --  26  GLUCOSE 152*  --  209*  BUN 28*  --  38*  CREATININE 1.20*  --  1.15*  CALCIUM 9.0  --  9.0   PT/INR No results for input(s): LABPROT, INR in the last 72 hours. ABG Recent Labs    11/07/20 1841 11/07/20 1957  PHART 7.325* 7.324*  HCO3 28.9* 28.3*    Studies/Results: CT ABDOMEN PELVIS WO CONTRAST  Result Date: 11/07/2020 CLINICAL DATA:  Abdominal distension, assess gastric outlet EXAM: CT ABDOMEN AND PELVIS WITHOUT CONTRAST TECHNIQUE: Multidetector CT imaging of the abdomen and pelvis was performed following the standard protocol without IV contrast. Sagittal and coronal MPR images reconstructed from axial data set. Patient drank dilute oral contrast for exam COMPARISON:  12/12/2009 FINDINGS: Lower chest: Bibasilar infiltrates, atelectasis, and small pleural effusions Hepatobiliary: Distended  gallbladder with dependent density which could represent vicarious excretion of contrast or tiny dependent calculi. Liver unremarkable. Pancreas: Atrophic pancreas without mass Spleen: Spleen unremarkable.  Small adjacent splenule. Adrenals/Urinary Tract: Adrenal glands normal. Peripelvic cyst mid LEFT kidney 4.0 x 3.1 cm increased in size since previous study. No additional renal mass or hydronephrosis. No ureteral calcification or dilatation. Bladder decompressed by Foley catheter. Stomach/Bowel: Nasogastric tube in stomach. Prior distal gastrectomy with gastrojejunostomy. Bowel wall thickening at anastomosis. Oral contrast however passes into the proximal jejunum. Filling defect likely small amount of food debris in stomach. Remaining bowel loops unremarkable. Vascular/Lymphatic: Atherosclerotic calcifications aorta, iliac arteries, visceral arteries, coronary arteries. Aorta normal caliber. No adenopathy. Reproductive: Uterus surgically absent. Nonvisualization of ovaries. Other: Free fluid in pelvis, small amount. Scattered stranding along umbilicus. No free air or hernia. Musculoskeletal: Osseous demineralization. Chronic lytic lesion L3 vertebral body unchanged since 2011 question vertebral hemangioma. IMPRESSION: Post distal gastrectomy with gastrojejunostomy anastomosis in LEFT upper quadrant. Bowel wall thickening at the gastrojejunostomy anastomosis though no evidence of outlet obstruction is identified; this could represent sequela of surgery, edema, or peri anastomotic ulcer, recommend correlation with endoscopy. Small amount of nonspecific free pelvic fluid. Increased size of peripelvic cyst mid LEFT kidney. Bibasilar pulmonary infiltrates, atelectasis and small pleural effusions. Cannot exclude tiny dependent calculi within gallbladder. Aortic Atherosclerosis (ICD10-I70.0). Electronically Signed   By: Crist Infante.D.  On: 11/07/2020 18:42   DG Abd 1 View  Result Date: 11/08/2020 CLINICAL DATA:   Post gastrectomy. Cortrak advancement under fluoroscopic guidance. EXAM: DG NASO G TUBE PLC W/FL-NO RAD; ABDOMEN - 1 VIEW CONTRAST:  10 mL Omnipaque 300 injected through the tube. FLUOROSCOPY TIME:  Fluoroscopy Time:  42 seconds Radiation Exposure Index (if provided by the fluoroscopic device): Number of Acquired Spot Images: 0 COMPARISON:  None. FINDINGS: The Cortrak tube was advanced under fluoroscopic guidance through the stomach and into the proximal small bowel. Contrast was injected to verify placement. IMPRESSION: Advancement of the Cortrak tube into the proximal small bowel. Electronically Signed   By: Rolm Baptise M.D.   On: 11/08/2020 11:14   DG Abd Portable 1V  Result Date: 11/07/2020 CLINICAL DATA:  Enteric tube placement EXAM: PORTABLE ABDOMEN - 1 VIEW COMPARISON:  11/07/2020 FINDINGS: Feeding tube is seen coursing below diaphragm with distal tip coiled within the gastric body. Previously seen NG tube is no longer evident. There are overlying cardiac leads. Patchy opacities within the visualized lung fields. Cardiomegaly. IMPRESSION: Feeding tube coiled within the gastric body. Electronically Signed   By: Davina Poke D.O.   On: 11/07/2020 14:52   DG Abd Portable 1V  Result Date: 11/07/2020 CLINICAL DATA:  NG tube placement. EXAM: PORTABLE ABDOMEN - 1 VIEW COMPARISON:  11/01/2020 FINDINGS: NG tube tip is in the mid stomach. The feeding tube is coiled in the proximal stomach. Bowel gas pattern is nonspecific with no evidence to suggest bowel obstruction. Degenerative changes noted lower lumbar spine. IMPRESSION: NG tube tip is in the mid stomach. Feeding tube is coiled in the proximal stomach. Electronically Signed   By: Misty Stanley M.D.   On: 11/07/2020 10:52   DG [place] Bailey G Tube Plc W/Fl-No Rad  Result Date: 11/08/2020 CLINICAL DATA:  Post gastrectomy. Cortrak advancement under fluoroscopic guidance. EXAM: DG NASO G TUBE PLC W/FL-NO RAD; ABDOMEN - 1 VIEW CONTRAST:  10 mL Omnipaque 300  injected through the tube. FLUOROSCOPY TIME:  Fluoroscopy Time:  42 seconds Radiation Exposure Index (if provided by the fluoroscopic device): Number of Acquired Spot Images: 0 COMPARISON:  None. FINDINGS: The Cortrak tube was advanced under fluoroscopic guidance through the stomach and into the proximal small bowel. Contrast was injected to verify placement. IMPRESSION: Advancement of the Cortrak tube into the proximal small bowel. Electronically Signed   By: Rolm Baptise M.D.   On: 11/08/2020 11:14   Overnight EEG with video  Result Date: 11/08/2020 Lora Havens, MD     11/08/2020 12:47 PM Patient Name: [name] MRN: [id number] Epilepsy Attending: Lora Havens Referring Physician/Provider: Dr. Ina Homes Duration: 11/07/2020 0910 11/08/2020 [id number] Patient history: 75 year old female status post in-hospital cardiac arrest on 7/6/202, ROSC achieved in about 6 minutes.  Now noted to have intermittent involuntary movements.  EEG to evaluate for seizures Level of alertness: Awake, asleep AEDs during EEG study: LEV Technical aspects: This EEG study was done with scalp electrodes positioned according to the 10-20 International system of electrode placement. Electrical activity was acquired at a sampling rate of 500Hz  and reviewed with a high frequency filter of 70Hz  and a low frequency filter of 1Hz . EEG data were recorded continuously and digitally stored. Description: During awake state, no clear posterior dominant rhythm was seen. Sleep was characterized by sleep spindles (12 to 14 Hz), maximal frontocentral region. EEG showed continuous generalized 3 to 5 Hz theta-delta slowing.  Hyperventilation and photic stimulation were not performed.  ABNORMALITY - Continuous slow, generalized IMPRESSION: This study is suggestive of moderate diffuse encephalopathy, nonspecific etiology. No seizures or epileptiform discharges were seen throughout the recording. Priyanka [name]     Anti-infectives: Anti-infectives (From admission, onward)    Start     Dose/Rate Route Frequency Ordered Stop   11/04/20 1245  cefTRIAXone (ROCEPHIN) 2 g in sodium chloride 0.9 % 100 mL IVPB        2 g 200 mL/hr over 30 Minutes Intravenous Every 24 hours 11/04/20 1148 11/06/20 1302   11/02/20 1000  cefTRIAXone (ROCEPHIN) 2 g in sodium chloride 0.9 % 100 mL IVPB  Status:  Discontinued        2 g 200 mL/hr over 30 Minutes Intravenous Every 24 hours 11/01/20 1257 11/02/20 0856   11/02/20 1000  doxycycline (VIBRAMYCIN) 100 mg in sodium chloride 0.9 % 250 mL IVPB  Status:  Discontinued        100 mg 125 mL/hr over 120 Minutes Intravenous Every 12 hours 11/01/20 1257 11/02/20 0856   11/02/20 1000  ceFEPIme (MAXIPIME) 2 g in sodium chloride 0.9 % 100 mL IVPB  Status:  Discontinued        2 g 200 mL/hr over 30 Minutes Intravenous Every 12 hours 11/02/20 0856 11/04/20 1148   11/01/20 0915  levofloxacin (LEVAQUIN) IVPB 750 mg  Status:  Discontinued        750 mg 100 mL/hr over 90 Minutes Intravenous Every 24 hours 11/01/20 0827 11/01/20 1257   11/01/20 0730  levofloxacin (LEVAQUIN) IVPB 500 mg  Status:  Discontinued        500 mg 100 mL/hr over 60 Minutes Intravenous Every 24 hours 11/01/20 0631 11/01/20 1257   10/02/2020 2100  ciprofloxacin (CIPRO) IVPB 400 mg        400 mg 200 mL/hr over 60 Minutes Intravenous Every 12 hours 10/09/2020 1258 10/27/2020 2148   10/17/2020 0600  ciprofloxacin (CIPRO) IVPB 400 mg        400 mg 200 mL/hr over 60 Minutes Intravenous On call to O.R. 10/01/2020 0547 10/27/2020 0847       Assessment/Plan: s/p Procedure(s): XI ROBOTIC ASSISTED DISTAL GASTRECTOMY (N/A)     Pathology from surgery 6/28, low grade dysplasia.  No cancer and 13 negative nodes.     Aspiration with loss of vital signs was witnessed and patient immediately received CPR and drugs with ROSC after a few minutes.  10/31/20 Shock - resolved VDRF - resolved.  ID - aspiration pneumonia- was on cefepime  then ceftriaxone.  Now off.  Afebrile and leukocytosis resolving.   Neuro- Pt mental status continues to improve. Still very weak.  Neurology weaning Keppra.  CV - Echo shows preserved ejection fraction and only grade 2 diastolic dysfunction.  Moderately elevated PA pressures.  HTN - restarting home meds.     Core track is in small intestine, so ok to advance tube feeds to goal (55 mL) Speech notes poor clearance with intake, so NPO still advised.    Will get CIR consult.    Transfer to progressive care.    LOS: 15 days   Milus Height, MD FACS Surgical Oncology, General Surgery, Trauma and [place] [facility], [place] for weekday/non holidays Check [email or web address] for coverage night/weekend/holidays  Do not use SecureChat as it is not reliable for timely patient care.

## 2020-11-10 ENCOUNTER — Inpatient Hospital Stay (HOSPITAL_COMMUNITY): Payer: Medicare PPO

## 2020-11-10 DIAGNOSIS — R1111 Vomiting without nausea: Secondary | ICD-10-CM | POA: Diagnosis not present

## 2020-11-10 DIAGNOSIS — Z903 Acquired absence of stomach [part of]: Secondary | ICD-10-CM | POA: Diagnosis not present

## 2020-11-10 DIAGNOSIS — J9601 Acute respiratory failure with hypoxia: Secondary | ICD-10-CM | POA: Diagnosis not present

## 2020-11-10 DIAGNOSIS — I469 Cardiac arrest, cause unspecified: Secondary | ICD-10-CM | POA: Diagnosis not present

## 2020-11-10 LAB — CBC
HCT: 28 % — ABNORMAL LOW (ref 36.0–46.0)
Hemoglobin: 8.1 g/dL — ABNORMAL LOW (ref 12.0–15.0)
MCH: 28.7 pg (ref 26.0–34.0)
MCHC: 28.9 g/dL — ABNORMAL LOW (ref 30.0–36.0)
MCV: 99.3 fL (ref 80.0–100.0)
Platelets: 343 10*3/uL (ref 150–400)
RBC: 2.82 MIL/uL — ABNORMAL LOW (ref 3.87–5.11)
RDW: 17.2 % — ABNORMAL HIGH (ref 11.5–15.5)
WBC: 11.5 10*3/uL — ABNORMAL HIGH (ref 4.0–10.5)
nRBC: 0 % (ref 0.0–0.2)

## 2020-11-10 LAB — BASIC METABOLIC PANEL
Anion gap: 6 (ref 5–15)
BUN: 37 mg/dL — ABNORMAL HIGH (ref 8–23)
CO2: 27 mmol/L (ref 22–32)
Calcium: 9.1 mg/dL (ref 8.9–10.3)
Chloride: 111 mmol/L (ref 98–111)
Creatinine, Ser: 1.08 mg/dL — ABNORMAL HIGH (ref 0.44–1.00)
GFR, Estimated: 53 mL/min — ABNORMAL LOW (ref >60)
Glucose, Bld: 132 mg/dL — ABNORMAL HIGH (ref 70–99)
Potassium: 4 mmol/L (ref 3.5–5.1)
Sodium: 144 mmol/L (ref 135–145)

## 2020-11-10 LAB — GLUCOSE, CAPILLARY
Glucose-Capillary: 122 mg/dL — ABNORMAL HIGH (ref 70–99)
Glucose-Capillary: 125 mg/dL — ABNORMAL HIGH (ref 70–99)
Glucose-Capillary: 129 mg/dL — ABNORMAL HIGH (ref 70–99)
Glucose-Capillary: 150 mg/dL — ABNORMAL HIGH (ref 70–99)
Glucose-Capillary: 180 mg/dL — ABNORMAL HIGH (ref 70–99)
Glucose-Capillary: 191 mg/dL — ABNORMAL HIGH (ref 70–99)

## 2020-11-10 LAB — PHOSPHORUS: Phosphorus: 3.3 mg/dL (ref 2.5–4.6)

## 2020-11-10 LAB — MAGNESIUM: Magnesium: 2.1 mg/dL (ref 1.7–2.4)

## 2020-11-10 MED ORDER — TRAVASOL 10 % IV SOLN
INTRAVENOUS | Status: AC
  Filled 2020-11-10: qty 672

## 2020-11-10 MED ORDER — SODIUM CHLORIDE 0.9% FLUSH
10.0000 mL | Freq: Two times a day (BID) | INTRAVENOUS | Status: DC
Start: 2020-11-10 — End: 2020-11-21
  Administered 2020-11-10 – 2020-11-19 (×19): 10 mL
  Administered 2020-11-20: 30 mL
  Administered 2020-11-20: 10 mL

## 2020-11-10 MED ORDER — LEVOTHYROXINE SODIUM 75 MCG PO TABS
150.0000 ug | ORAL_TABLET | Freq: Every day | ORAL | Status: DC
  Administered 2020-11-11 – 2020-11-14 (×4): 150 ug
  Filled 2020-11-10 (×4): qty 2

## 2020-11-10 MED ORDER — AMLODIPINE BESYLATE 5 MG PO TABS
5.0000 mg | ORAL_TABLET | Freq: Every day | ORAL | Status: DC
  Administered 2020-11-10: 5 mg
  Filled 2020-11-10: qty 1

## 2020-11-10 MED ORDER — SODIUM CHLORIDE 0.9% FLUSH
10.0000 mL | INTRAVENOUS | Status: DC | PRN
  Administered 2020-11-11: 10 mL

## 2020-11-10 NOTE — TOC Progression Note (Signed)
Transition of Care [facility]) - Progression Note    Patient Details  Name: [name] MRN: [id number] Date of Birth: 01-24-1946  [facility]) CM/SW [place], RN Phone Number: 11/10/2020, 7:47 AM  Clinical Narrative:      Patient is progressing and has orders to move to progressive care. The plan is for CIR. CIR already evaluated and concur, she will be projected to be inpatient 3 more days. Will make sure Alvis Lemmings is aware of CIR potential.   Expected Discharge Plan: [place]    Expected Discharge Plan and Services Expected Discharge Plan: [place]   Discharge Planning Services: CM Consult Post Acute Care Choice: [place] arrangements for the past 2 months: Single Family Home                 DME Arranged: N/A DME Agency: NA       HH Arranged: PT HH Agency: [place] Date [place]: 10/27/20 Time HH Agency Contacted: [phone] Representative spoke with at [place]: [place] ([place]) Interventions    Readmission Risk Interventions No flowsheet data found.

## 2020-11-10 NOTE — Progress Notes (Signed)
RT note. Patient not requiring bipap at this time. Patient on [place] sat 97%, bipap is in patients room when ready for bed. RT will continue to monitor

## 2020-11-10 NOTE — Progress Notes (Addendum)
Nutrition Follow Up  DOCUMENTATION CODES:   Not applicable  INTERVENTION:   Recommend J-tube placement given delayed gastric emptying and persistent nausea/vomiting  Continue TPN to meet 100% of needs until EN can be restarted and is tolerated at goal  Once enteral access is in appropriate position  -Vital 1.5 @ 55 ml/hr (1320 ml) -ProSource TF 45 ml BID  Provides: 2060 kcals, 111 grams protein, 1008 ml free water.   NUTRITION DIAGNOSIS:   Increased nutrient needs related to post-op healing as evidenced by estimated needs.  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 Presents this admission for elective robotic distal gastrectomy.  6/28- s/p robotic distal gastrectomy 7/05- PEA arrest, intubated, post pyloric Cortrak placed, trickles started  7/07- Vital 1.5 at goal rate 7/10- Cortrak pulled per patient  7/11- Extubated, Cortrak replaced gastric, CTA negative for gastric distention/leak 7/12- Cortrak advanced per diagnostic radiology into small bowel, TF advanced to goal  Patient had bilious vomiting episode upon turning in the bed yesterday and another episode this am (greenish color, did not reflect tube feeding). NG placed for gastric decompression. KUB this am shows both NG and Cortrak tips located in the stomach. Plan for EGD tomorrow at 1:30 to assess bowel wall thickening at the anastomosis. Tube feeding stopped for now. TPN to start in the meantime.    Cortrak has required multiple manipulations into small bowel which have been difficult given bowel wall thickening. Once patient has undergone EGD, consider advancement  of Cortrak back into the small bowel per diagnostic radiology or surgical J-tube placement.   Admission weight: 102.1 kg  Current weight: 94.6 kg   UOP: 450 ml x 24 hrs  NG: 1500 ml x 24 hrs   Medications: dulcolax, colace, SS novolog, 10 mg reglan TID, miralax Labs: CBG 117-150  Diet Order:   Diet Order             Diet NPO time specified  Diet effective now                   EDUCATION NEEDS:   Not appropriate for education at this time  Skin:  Skin Assessment: Skin Integrity Issues: Skin Integrity Issues:: Incisions, Stage II Stage II: sacrum Incisions: abdomen  Last BM:  7/13- medium amount per RN  Height:   Ht Readings from Last 1 Encounters:  10/12/2020 4\' 11"  (1.499 m)    Weight:   Wt Readings from Last 1 Encounters:  11/10/20 94.6 kg    BMI:  Body mass index is 42.12 kg/m.  Estimated Nutritional Needs:   Kcal:  1800-2000 kcal  Protein:  100-120 grams  Fluid:  >/= 1.8 L/day  Mariana Single MS, RD, LDN, CNSC Clinical Nutrition Pager listed in [place]

## 2020-11-10 NOTE — Progress Notes (Signed)
Peripherally Inserted Central Catheter Placement  The IV Nurse has discussed with the patient and/or persons authorized to consent for the patient, the purpose of this procedure and the potential benefits and risks involved with this procedure.  The benefits include less needle sticks, lab draws from the catheter, and the patient may be discharged home with the catheter. Risks include, but not limited to, infection, bleeding, blood clot (thrombus formation), and puncture of an artery; nerve damage and irregular heartbeat and possibility to perform a PICC exchange if needed/ordered by physician.  Alternatives to this procedure were also discussed.  Bard Power PICC patient education guide, fact sheet on infection prevention and patient information card has been provided to patient /or left at bedside.    PICC Placement Documentation  PICC Double Lumen 37/09/64 PICC Left Basilic 42 cm 0 cm (Active)  Indication for Insertion or Continuance of Line Administration of hyperosmolar/irritating solutions (i.e. TPN, Vancomycin, etc.) 11/10/20 1659  Exposed Catheter (cm) 0 cm 11/10/20 1659  Site Assessment Clean;Dry;Intact 11/10/20 1659  Lumen #1 Status Flushed;Saline locked;Blood return noted 11/10/20 1659  Lumen #2 Status Flushed;Saline locked;Blood return noted 11/10/20 1659  Dressing Type Transparent;Securing device 11/10/20 1659  Dressing Status Clean;Dry;Intact 11/10/20 1659  Antimicrobial disc in place? Yes 11/10/20 1659  Dressing Intervention New dressing 11/10/20 3838  Dressing Change Due 11/17/20 11/10/20 1659    Consent signed by husband, Judie Bonus, via telephone consent. Verified by 2 PICC RNs.   Enos Fling 11/10/2020, 5:01 PM

## 2020-11-10 NOTE — H&P (View-Only) (Signed)
Reason for Consult: Nausea/vomiting and aspiration pneumonia Referring Physician: Stark Klein, M.D.  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  The surgery was performed without complications and postoperatively she was well until her diet was advanced.  The pathology from the resection did not show any residual adenocarcinoma or any other suspicious findings.  She suffered with a cardiac arrest on 11/01/2020 secondary to aspiration pneumonia and she was able to achieve ROSC within a few minutes.  This required ICU care and intubation.  She required pressors, but ultimately she stabilized and improved.  Since that time any attempt to advance her diet only resulted in nausea and vomiting.  Her CT scan of the abdomen/pelvis on 11/07/2020 shows bowel well thickening at the gastrojejunostomy, but there was no evidence of any obstruction.  Contrast did pass through.  Past Medical History:  Diagnosis Date   Anxiety    CAD (coronary artery disease)     (Nonobstructive coronary artery disease 40% LAD stenosis)  2011   Cancer (HCC)    gastric   CHF (congestive heart failure) (HCC)    Chronic kidney disease    STAGE 4 KIDNEY DISEASE   Depression    DJD (degenerative joint disease)    DM (diabetes mellitus) (HCC)    GERD (gastroesophageal reflux disease)    H/O hiatal hernia    HTN (hypertension)    Hypothyroidism    OSA (obstructive sleep apnea)    doesn't wear CPAP   Stroke [facility])     Past Surgical History:  Procedure Laterality Date   ABDOMINAL HYSTERECTOMY     BIOPSY  06/17/2020   Procedure: BIOPSY;  Surgeon: Carol Ada, MD;  Location: [facility];  Service: Endoscopy;;   BIOPSY  07/01/2020   Procedure: BIOPSY;  Surgeon: Carol Ada, MD;  Location: [facility];  Service: Endoscopy;;   CARPAL TUNNEL  RELEASE     CERVICAL [place]  06/17/2020   Procedure: ESOPHAGEAL DILATION;  Surgeon: Carol Ada, MD;  Location: [facility];  Service: Endoscopy;;   ESOPHAGOGASTRODUODENOSCOPY (EGD) WITH PROPOFOL N/A 06/17/2020   Procedure: ESOPHAGOGASTRODUODENOSCOPY (EGD) WITH PROPOFOL;  Surgeon: Carol Ada, MD;  Location: [facility];  Service: Endoscopy;  Laterality: N/A;   ESOPHAGOGASTRODUODENOSCOPY (EGD) WITH PROPOFOL N/A 07/01/2020   Procedure: ESOPHAGOGASTRODUODENOSCOPY (EGD) WITH PROPOFOL;  Surgeon: Carol Ada, MD;  Location: [facility];  Service: Endoscopy;  Laterality: N/A;   HEMOSTASIS CLIP PLACEMENT  07/01/2020   Procedure: HEMOSTASIS CLIP PLACEMENT;  Surgeon: Carol Ada, MD;  Location: [facility];  Service: Endoscopy;;   JOINT REPLACEMENT     POLYPECTOMY  07/01/2020   Procedure: POLYPECTOMY;  Surgeon: Carol Ada, MD;  Location: [facility];  Service: Endoscopy;;   REPLACEMENT TOTAL KNEE Bilateral    SALPINGOOPHORECTOMY     SUBMUCOSAL INJECTION  07/01/2020   Procedure: SUBMUCOSAL INJECTION;  Surgeon: Carol Ada, MD;  Location: [facility];  Service: Endoscopy;;   TONSILLECTOMY     UVULOPALATOPHARYNGOPLASTY      Family History  Problem Relation Age of Onset   Breast cancer Mother    Breast cancer Sister     Social History:  reports that she has never smoked. She has never used smokeless tobacco. She reports that she does not drink alcohol and does not use drugs.  Allergies:  Allergies  Allergen Reactions  Codeine Itching   Meperidine Hcl Itching   Morphine Itching   Nylon Other (See Comments)    Stitches cause infection   Penicillins Hives, Itching and Rash    Tolerated cefepime and ceftriaxone Has patient had a PCN reaction causing immediate rash, facial/tongue/throat swelling, SOB or lightheadedness with hypotension: Yes Has patient had a PCN reaction causing severe rash involving mucus membranes or skin necrosis: Yes Has  patient had a PCN reaction that required hospitalization: No Has patient had a PCN reaction occurring within the last 10 years: No If all of the above answers are "NO", then may proceed with Cephalosporin use.     Medications: Scheduled:  amLODipine  5 mg Per Tube Daily   bisacodyl  10 mg Rectal Daily   chlorhexidine gluconate (MEDLINE KIT)  15 mL Mouth Rinse BID   Chlorhexidine Gluconate Cloth  6 each Topical Daily   docusate  100 mg Per Tube BID   enoxaparin (LOVENOX) injection  40 mg Subcutaneous Q24H   feeding supplement (PROSource TF)  45 mL Per Tube BID   insulin aspart  0-15 Units Subcutaneous Q4H   [START ON 11/23/2020] levothyroxine  150 mcg Per Tube Q0600   metoCLOPramide (REGLAN) injection  10 mg Intravenous Q8H   pantoprazole (PROTONIX) IV  40 mg Intravenous Daily   polyethylene glycol  17 g Per Tube BID   Continuous:  sodium chloride 10 mL/hr at 11/02/20 0949   feeding supplement (VITAL 1.5 CAL) Stopped (11/09/20 1353)   levETIRAcetam Stopped (11/10/20 1023)   TPN ADULT (ION)      Results for orders placed or performed during the hospital encounter of 10/12/2020 (from the past 24 hour(s))  Glucose, capillary     Status: Abnormal   Collection Time: 11/09/20  3:46 PM  Result Value Ref Range   Glucose-Capillary 143 (H) 70 - 99 mg/dL  Glucose, capillary     Status: Abnormal   Collection Time: 11/09/20  8:33 PM  Result Value Ref Range   Glucose-Capillary 117 (H) 70 - 99 mg/dL  Glucose, capillary     Status: Abnormal   Collection Time: 11/09/20 11:37 PM  Result Value Ref Range   Glucose-Capillary 138 (H) 70 - 99 mg/dL  Basic metabolic panel     Status: Abnormal   Collection Time: 11/10/20  2:56 AM  Result Value Ref Range   Sodium 144 135 - 145 mmol/L   Potassium 4.0 3.5 - 5.1 mmol/L   Chloride 111 98 - 111 mmol/L   CO2 27 22 - 32 mmol/L   Glucose, Bld 132 (H) 70 - 99 mg/dL   BUN 37 (H) 8 - 23 mg/dL   Creatinine, Ser 1.08 (H) 0.44 - 1.00 mg/dL   Calcium 9.1 8.9 -  10.3 mg/dL   GFR, Estimated 53 (L) >60 mL/min   Anion gap 6 5 - 15  Magnesium     Status: None   Collection Time: 11/10/20  2:56 AM  Result Value Ref Range   Magnesium 2.1 1.7 - 2.4 mg/dL  Phosphorus     Status: None   Collection Time: 11/10/20  2:56 AM  Result Value Ref Range   Phosphorus 3.3 2.5 - 4.6 mg/dL  CBC     Status: Abnormal   Collection Time: 11/10/20  2:56 AM  Result Value Ref Range   WBC 11.5 (H) 4.0 - 10.5 K/uL   RBC 2.82 (L) 3.87 - 5.11 MIL/uL   Hemoglobin 8.1 (L) 12.0 - 15.0 g/dL   HCT 28.0 (L)  36.0 - 46.0 %   MCV 99.3 80.0 - 100.0 fL   MCH 28.7 26.0 - 34.0 pg   MCHC 28.9 (L) 30.0 - 36.0 g/dL   RDW 17.2 (H) 11.5 - 15.5 %   Platelets 343 150 - 400 K/uL   nRBC 0.0 0.0 - 0.2 %  Glucose, capillary     Status: Abnormal   Collection Time: 11/10/20  4:08 AM  Result Value Ref Range   Glucose-Capillary 129 (H) 70 - 99 mg/dL  Glucose, capillary     Status: Abnormal   Collection Time: 11/10/20  7:36 AM  Result Value Ref Range   Glucose-Capillary 125 (H) 70 - 99 mg/dL  Glucose, capillary     Status: Abnormal   Collection Time: 11/10/20 11:32 AM  Result Value Ref Range   Glucose-Capillary 150 (H) 70 - 99 mg/dL     DG Abd 1 View  Result Date: 11/10/2020 CLINICAL DATA:  Status post NG and feeding tube placement. EXAM: ABDOMEN - 1 VIEW COMPARISON:  Single-view of the abdomen 11/09/2020. FINDINGS: NG tube feeding tube are both within the stomach with their tips directed distally. Side port of the NG tube is also in the stomach. IMPRESSION: As above. Electronically Signed   By: Inge Rise M.D.   On: 11/10/2020 11:43   DG Abd 1 View  Result Date: 11/09/2020 CLINICAL DATA:  NG tube placement. EXAM: ABDOMEN - 1 VIEW COMPARISON:  CT November 07, 2020. FINDINGS: Enteric feeding catheter coursing below the diaphragm with tip in the mid left hemiabdomen, which given the altered anatomy from prior distal gastrectomy seen on recent CT is likely post pylorus. The bowel gas pattern  is normal. Small left pleural effusion with atelectasis. IMPRESSION: Enteric feeding catheter coursing below the diaphragm with tip in the mid left hemiabdomen, which given the altered anatomy from prior distal gastrectomy is likely post pyloric. Electronically Signed   By: Dahlia Bailiff MD   On: 11/09/2020 16:02   [place] EKG SITE RITE  Result Date: 11/09/2020 If Site Rite image not attached, placement could not be confirmed due to current cardiac rhythm.   ROS:  As stated above in the HPI otherwise negative.  Blood pressure (!) 162/77, pulse 78, temperature 98.2 F (36.8 C), temperature source Oral, resp. rate (!) 22, height _0  (1.499 m), weight 94.6 kg, SpO2 100 %.    PE: Gen: NAD, Alert and Oriented HEENT:  [place]/AT, EOMI Neck: Supple, no LAD Lungs: CTA Bilaterally CV: RRR without M/G/R ABD: Soft, NTND, +BS Ext: No C/C/E  Assessment/Plan: 1) Persistent nausea and vomiting. 2) Aspiration pneumonia. 3) S/p distal gastrectomy.   With the persistent nausea and vomiting and the findings of the bowel wall thickening at the anastomosis, an EGD will be performed.  It appears that the NG tube and feeding tube are out of position.  Plan: 1) EGD tomorrow at 1:30 PM.  Zebulin Siegel D 11/10/2020, 2:49 PM

## 2020-11-10 NOTE — Progress Notes (Signed)
16 Days Post-Op   Subjective/Chief Complaint: Threw up again yesterday.  Was brown/bilious and NOT consistent with tube feeds.  No aspiration noted.  No alteration in BP/HR/sats.  Transfer to stepdown cancelled due to large volume of emesis.  UOP also down.     Objective: Vital signs in last 24 hours: Temp:  [97.7 F (36.5 C)-98.7 F (37.1 C)] 98.1 F (36.7 C) (07/14 0739) Pulse Rate:  [53-78] 61 (07/14 0700) Resp:  [18-28] 21 (07/14 0700) BP: (95-190)/(52-130) 146/121 (07/14 0700) SpO2:  [94 %-100 %] 100 % (07/14 0700) FiO2 (%):  [30 %] 30 % (07/14 0400) Weight:  [94.6 kg] 94.6 kg (07/14 0305) Last BM Date: 11/06/20  Intake/Output from previous day: 07/13 0701 - 07/14 0700 In: 412.3 [IV Piggyback:412.3] Out: 1950 [Urine:450; Emesis/NG output:1500] Intake/Output this shift: No intake/output data recorded.  General: sleeping, arousable Heart: regular, rate, and rhythm.   Resp: shallow, but comfortable.  GI - soft, non distended, non tender.  Heel protectors in place.   Incisions c/d/i  Lab Results:  Recent Labs    11/09/20 0308 11/10/20 0256  WBC 12.5* 11.5*  HGB 8.1* 8.1*  HCT 27.7* 28.0*  PLT 318 343   BMET Recent Labs    11/09/20 0308 11/10/20 0256  NA 143 144  K 4.5 4.0  CL 108 111  CO2 26 27  GLUCOSE 209* 132*  BUN 38* 37*  CREATININE 1.15* 1.08*  CALCIUM 9.0 9.1   PT/INR No results for input(s): LABPROT, INR in the last 72 hours. ABG Recent Labs    11/07/20 1841 11/07/20 1957  PHART 7.325* 7.324*  HCO3 28.9* 28.3*    Studies/Results: DG Abd 1 View  Result Date: 11/09/2020 CLINICAL DATA:  NG tube placement. EXAM: ABDOMEN - 1 VIEW COMPARISON:  CT November 07, 2020. FINDINGS: Enteric feeding catheter coursing below the diaphragm with tip in the mid left hemiabdomen, which given the altered anatomy from prior distal gastrectomy seen on recent CT is likely post pylorus. The bowel gas pattern is normal. Small left pleural effusion with atelectasis.  IMPRESSION: Enteric feeding catheter coursing below the diaphragm with tip in the mid left hemiabdomen, which given the altered anatomy from prior distal gastrectomy is likely post pyloric. Electronically Signed   By: Dahlia Bailiff MD   On: 11/09/2020 16:02   DG Abd 1 View  Result Date: 11/08/2020 CLINICAL DATA:  Post gastrectomy. Cortrak advancement under fluoroscopic guidance. EXAM: DG NASO G TUBE PLC W/FL-NO RAD; ABDOMEN - 1 VIEW CONTRAST:  10 mL Omnipaque 300 injected through the tube. FLUOROSCOPY TIME:  Fluoroscopy Time:  42 seconds Radiation Exposure Index (if provided by the fluoroscopic device): Number of Acquired Spot Images: 0 COMPARISON:  None. FINDINGS: The Cortrak tube was advanced under fluoroscopic guidance through the stomach and into the proximal small bowel. Contrast was injected to verify placement. IMPRESSION: Advancement of the Cortrak tube into the proximal small bowel. Electronically Signed   By: Rolm Baptise M.D.   On: 11/08/2020 11:14   DG Naso G Tube Plc W/Fl-No Rad  Result Date: 11/08/2020 CLINICAL DATA:  Post gastrectomy. Cortrak advancement under fluoroscopic guidance. EXAM: DG NASO G TUBE PLC W/FL-NO RAD; ABDOMEN - 1 VIEW CONTRAST:  10 mL Omnipaque 300 injected through the tube. FLUOROSCOPY TIME:  Fluoroscopy Time:  42 seconds Radiation Exposure Index (if provided by the fluoroscopic device): Number of Acquired Spot Images: 0 COMPARISON:  None. FINDINGS: The Cortrak tube was advanced under fluoroscopic guidance through the stomach and into the  proximal small bowel. Contrast was injected to verify placement. IMPRESSION: Advancement of the Cortrak tube into the proximal small bowel. Electronically Signed   By: Rolm Baptise M.D.   On: 11/08/2020 11:14   [place] EKG SITE RITE  Result Date: 11/09/2020 If Site Rite image not attached, placement could not be confirmed due to current cardiac rhythm.   Anti-infectives: Anti-infectives (From admission, onward)    Start     Dose/Rate  Route Frequency Ordered Stop   11/04/20 1245  cefTRIAXone (ROCEPHIN) 2 g in sodium chloride 0.9 % 100 mL IVPB        2 g 200 mL/hr over 30 Minutes Intravenous Every 24 hours 11/04/20 1148 11/06/20 1302   11/02/20 1000  cefTRIAXone (ROCEPHIN) 2 g in sodium chloride 0.9 % 100 mL IVPB  Status:  Discontinued        2 g 200 mL/hr over 30 Minutes Intravenous Every 24 hours 11/01/20 1257 11/02/20 0856   11/02/20 1000  doxycycline (VIBRAMYCIN) 100 mg in sodium chloride 0.9 % 250 mL IVPB  Status:  Discontinued        100 mg 125 mL/hr over 120 Minutes Intravenous Every 12 hours 11/01/20 1257 11/02/20 0856   11/02/20 1000  ceFEPIme (MAXIPIME) 2 g in sodium chloride 0.9 % 100 mL IVPB  Status:  Discontinued        2 g 200 mL/hr over 30 Minutes Intravenous Every 12 hours 11/02/20 0856 11/04/20 1148   11/01/20 0915  levofloxacin (LEVAQUIN) IVPB 750 mg  Status:  Discontinued        750 mg 100 mL/hr over 90 Minutes Intravenous Every 24 hours 11/01/20 0827 11/01/20 1257   11/01/20 0730  levofloxacin (LEVAQUIN) IVPB 500 mg  Status:  Discontinued        500 mg 100 mL/hr over 60 Minutes Intravenous Every 24 hours 11/01/20 0631 11/01/20 1257   10/11/2020 2100  ciprofloxacin (CIPRO) IVPB 400 mg        400 mg 200 mL/hr over 60 Minutes Intravenous Every 12 hours 10/16/2020 1258 09/29/2020 2148   10/02/2020 0600  ciprofloxacin (CIPRO) IVPB 400 mg        400 mg 200 mL/hr over 60 Minutes Intravenous On call to O.R. 10/09/2020 0547 10/09/2020 0847       Assessment/Plan: s/p Procedure(s): XI ROBOTIC ASSISTED DISTAL GASTRECTOMY (N/A)     Pathology from surgery 6/28, low grade dysplasia.  No cancer and 13 negative nodes.     Aspiration with loss of vital signs was witnessed and patient immediately received CPR and drugs with ROSC after a few minutes.  10/31/20 Shock - resolved VDRF - resolved.  ID - aspiration pneumonia- was on cefepime then ceftriaxone.  Now off.  Afebrile and leukocytosis resolving.   Neuro- Pt mental  status continues to improve. Still very weak.  Neurology weaning Keppra.  CV - Echo shows preserved ejection fraction and only grade 2 diastolic dysfunction.  Moderately elevated PA pressures.  HTN - restarting home meds.    Pt with emesis again.  Will start TNA today.   I will discuss potential of endoscopy with GI.  Pt is from Dr. Ulyses Amor practice; I will see if he is in town.    Speech notes poor clearance with intake, so NPO still advised.   Will give some IV Fluids while off tube feeds and not using TNA.    CIR consult.       LOS: 16 days   Milus Height, MD [facility] Surgical Oncology,  General Surgery, Trauma and [facility], [place] [phone] for weekday/non holidays Check [email or web address] for coverage night/weekend/holidays  Do not use SecureChat as it is not reliable for timely patient care.

## 2020-11-10 NOTE — Progress Notes (Signed)
Physical Therapy Treatment Patient Details Name: [name] MRN: [id number] DOB: Aug 01, 1945 Today's Date: 11/10/2020    History of Present Illness Pt is a 75 y/o female admitted 6/28 for elective robotic distal gastrectomy, complicated by 6 minute code (asystole then PEA) on 7/5.  EEG 7/6 with severe diffuse encephalopathy. 7/10 MRI negative.  7/12 extubated. PMH: obesity, hiatal hernia, CHF, CAD, h/o CVA.    PT Comments    Pt limited by anxiety, resulting in tachycardia and tachypnea. Pt does respond well to verbal cues to slow RR and increase inspiratory volume. Pt continues to require 2 person assist for functional mobility to maintain safety and due to weakness. Pt will continue to benefit from aggressive mobilization and acute PT services to improve activity tolerance and reduce falls risk. PT continues to recommend CIR placement at this time.   Follow Up Recommendations  CIR     Equipment Recommendations  Wheelchair (measurements PT)    Recommendations for Other Services       Precautions / Restrictions Precautions Precautions: Fall Precaution Comments: cortrak, NG Restrictions Weight Bearing Restrictions: No    Mobility  Bed Mobility Overal bed mobility: Needs Assistance Bed Mobility: Supine to Sit;Sit to Supine;Rolling Rolling: Min assist   Supine to sit: Mod assist;+2 for physical assistance Sit to supine: Mod assist;+2 for physical assistance   General bed mobility comments: pt performs supine to sit and 3 times during session    Transfers Overall transfer level: Needs assistance Equipment used: 2 person hand held assist Transfers: Sit to/from Stand;Stand Pivot Transfers Sit to Stand: Min assist;+2 physical assistance Stand pivot transfers: Min assist;+2 physical assistance       General transfer comment: bilateral hand hold and knee block for sit to stand  Ambulation/Gait                 Stairs             Wheelchair  Mobility    Modified Rankin (Stroke Patients Only)       Balance Overall balance assessment: Needs assistance Sitting-balance support: Feet supported;Single extremity supported;Bilateral upper extremity supported Sitting balance-[name]: Poor Sitting balance - Comments: reliant on UE support of bed or hand hold   Standing balance support: Bilateral upper extremity supported Standing balance-[name]: Poor Standing balance comment: reliant on BUE support and minA                            Cognition Arousal/Alertness: Awake/alert Behavior During Therapy: Anxious Overall Cognitive Status: Impaired/Different from baseline Area of Impairment: Problem solving                             Problem Solving: Slow processing        Exercises      General Comments General comments (skin integrity, edema, etc.): pt on 3L [place] during session, RR up to 45 and HR to 144 with mobility. PT provides cues for slowed, deep breathing which improves HR and RR. Pt with significant tremor during session, reporting tremors at baseline      Pertinent Vitals/Pain Pain Assessment: No/denies pain    Home Living                      Prior Function            PT Goals (current goals can now be found in the care plan  section) Acute Rehab PT Goals Patient Stated Goal: get home Progress towards PT goals: Progressing toward goals    Frequency    Min 3X/week      PT Plan Current plan remains appropriate    Co-evaluation              AM-PAC PT "6 Clicks" Mobility   Outcome Measure  Help needed turning from your back to your side while in a flat bed without using bedrails?: A Little Help needed moving from lying on your back to sitting on the side of a flat bed without using bedrails?: A Lot Help needed moving to and from a bed to a chair (including a wheelchair)?: Total Help needed standing up from a chair using your arms (e.g., wheelchair or  bedside chair)?: Total Help needed to walk in hospital room?: Total Help needed climbing 3-5 steps with a railing? : Total 6 Click Score: 9    End of Session Equipment Utilized During Treatment: Oxygen;Gait belt Activity Tolerance: Other (comment);Treatment limited secondary to medical complications (Comment) (anxiety, tachycardia) Patient left: in chair;with call bell/phone within reach;with chair alarm set Nurse Communication: Mobility status PT Visit Diagnosis: Other abnormalities of gait and mobility (R26.89);Muscle weakness (generalized) (M62.81)     Time: 6815-9470 PT Time Calculation (min) (ACUTE ONLY): 39 min  Charges:  $Therapeutic Activity: 38-52 mins                     Zenaida Niece, PT, DPT Acute Rehabilitation Pager: [phone]    Zenaida Niece 11/10/2020, 12:50 PM

## 2020-11-10 NOTE — Progress Notes (Signed)
PHARMACY - TOTAL PARENTERAL NUTRITION CONSULT NOTE   Indication: partial gastrectomy and TF intolerance   Patient Measurements: Height: 4\' 11"  (149.9 cm) Weight: 94.6 kg (208 lb 8.9 oz) IBW/kg (Calculated) : 43.2 TPN AdjBW (KG): 56.1 Body mass index is 42.12 kg/m. Usual Weight: 95 kg  Assessment:  75 yo W admitted for 6/28 elective distal gastrectomy for malignant polyp. Patient had a 6 minute code on 7/5 with severe respiratory failure and shock. Patient had an ileus and presumed functional gastric outlet obstruction post-op.  Patient tolerated TF 7/5-7/7 at 20 ml/hr, 7/7- 7/8 at 30 ml/hr, 7/9- 7/10 at 40 ml/hr (goal 55 ml/hr). TF were held due to access issues then restarted 7/12 at 20-30 ml/hr. TF were stopped 7/13 for large volume bilious emesis that does not look like TF. Weight unchanged from usual but had anasarca earlier this admit, now resolved.   Glucose / Insulin: BG < 150, received 6 units insulin  Electrolytes: K 4.0 (SPS x1 7/12) CoCa 10.54, other lytes wnl Renal: Scr 1.08, BUN 37 GI/Hepatic: LFT wnl, Tbili 2.0, albumin 2.2; on metoclopramide IV Q8 hr Intake / Output; MIVF:  UOP incompletely documented. LBM x2 7/13. Net -5.4L GI Imaging: 7/13 Abd xray NG tube placement 7/12 Cortrak advanced to small bowel 7/11 CT abd Bowel wall thickening at the  gastrojejunostomy anastomosis though no evidence of outlet obstruction is identified GI Surgeries / Procedures:  6/28 elective distal gastrectomy  Central access: 7/5 Triple IJ TPN start date: 7/14   Nutritional Goals (per RD recommendation on 7/11): kCal: 1800-2000, Protein: 100-120, Fluid: >/= 1.8 L/d Goal TPN rate is 75 mL/hr (provides 100 g of protein, and 1800 kcals per day)  Current Nutrition:  NPO and TPN  Plan:  Start TPN at 20mL/hr at 1800 (provides 67g of protein, 1200 Kcal, meeting about 67% of estimated needs) Electrolytes in TPN: Na 145mEq/L, K 73mEq/L, Ca 45mEq/L, Mg 70mEq/L, and Phos 19mmol/L. Cl:Ac  1:1 Add standard MVI and trace elements to TPN Continue Moderate q4h SSI and adjust as needed  Monitor TPN labs daily until goal then on Mon/Thurs   Benetta Spar, PharmD, BCPS, [facility] Clinical Pharmacist  Please check AMION for all [place] phone numbers After 10:00 PM, call [place]

## 2020-11-10 NOTE — Progress Notes (Signed)
PROGRESS NOTE    [name]  TGP:[id number] DOB: [birth year] DOA: 10/18/2020 PCP: Harlan Stains, MD   Brief Narrative:  The patient is a morbidly obese Caucasian female with a past medical history significant for but not limited to OSA, diabetes mellitus type 2, hypertension, CAD, history of allergic rhinitis and as well as other comorbidities who was admitted 10/06/2020 for elective robotic distal gastrectomy as surgical intervention for recently found EGD polyp of the gastric antrum with focal intramucosal adenocarcinoma.  She had the procedure done on 10/26/2019.  Pathology was negative malignancy but did show EGD with negative nodes.  Subsequently early a.m. she had wheezing and received a breathing treatment.  Shortly thereafter she was found with emesis all over the bed and sore.  She had gurgling sounds and altered Loss of consciousness.  She ended up having a loss of pulse with initial rhythm of asystole.  She received 1 mg epinephrine before PEA which she received a second dose of epinephrine.  She then had ROSC after 6 minutes and she was intubated during the code.  She was transferred to [facility] was asked to manage and assist with the vent.  On 7 5 she was initially started on Levaquin but then changed to ceftriaxone and then doxycycline and subsequently escalated to IV cefepime.  Given her code she underwent a EEG which showed generalized slowing of severe diffuse diffuse encephalopathy nonspecific etiology.  Transesophageal echocardiogram done which showed a LVEF of 65 to 70% and grade 2 diastolic dysfunction.  She subsequently extubated on 11/08/2020 and critical care signed off on 11/09/2020 however TRH was asked to pick up for medical management.  Assessment & Plan:   Active Problems:   S/P partial gastrectomy   Cardiac arrest (HCC)   Emesis   Respiratory failure (HCC)   SOB (shortness of breath)   Acute respiratory failure with hypoxemia (HCC)   Encounter for central  line placement   Pressure injury of skin  Septic shock from aspiration pneumonia and tingling vaso-occlusion from sedation and third spacing, improved and resolved -She is status post antibiotics and sepsis physiology has improved significantly -Her echocardiogram showed an EF of 65 to 70% but did show grade 2 diastolic dysfunction -WBC has gone from 10.1 -> 12.5 -> 11.5  -Continue with acetaminophen for mild pain or fever -Fluid hydration now discontinued but Surgery will try some IVF while off of TF and not getting TPN -Has Cortrak but still had Nausea and Vomiting  Acute respiratory failure with hypoxia in the setting of aspiration pneumonia -Cultures have been negative and she is completed her dose of IV antibiotics with ceftriaxone -She is now extubated and respiratory status improves -SLP still evaluating as she still has dysphagia -SpO2: 100 % O2 Flow Rate (L/min): 3 L/min FiO2 (%): 30 % -Continue with DuoNeb 3 mL every 4 hours as needed for wheezing or shortness of breath  Cardiac arrest secondary to likely aspiration -Achieved ROSC in 6 minutes -Echocardiogram done and showed an EF of 65 to 70% and grade 2 diastolic dysfunction -Continue monitor  Postop gastrectomy  -She did core track in the duodenum and had been tolerating tube feeds until she started vomiting.  Because of her vomiting his tube feedings have been discontinued and GIs been consulted for EGD this will be done in the morning -Per primary team  Dysphagia -In the setting of deconditioning -SLP working and she is now n.p.o. and will get TPN f through PICC line -Currently going to  be switched over to TPN but 1 tube feedings are resumed they are recommending vital 1.5 Cal at 55 MLS per hour and Prosource tube feedings at 45 mL per 2 twice daily  CKD stage IIIa -Patient's BUN/Creatinine Is Now 37/1.08 -We Will Continue Monitor and Trend Avoid Nephrotoxic Medications, Contrast Dyes, Hypotension Renally Dose  Medications -Repeat CMP in A.M.  Hypothyroidism -Patient's TSH was 5.572 with a free T4 of 0.47 and a free T3 of 0.6 -Currently getting levothyroxine 50 mcg per tube daily  Intermittent twitching -She had intermittent twitching after her cardiac arrest and no events were captured on long-term EEG -EEG did not show any interictal and patient did not have any definitive seizures so neurology reduced her Keppra to 250 mg twice daily and recommended the patient remain seizure-free Keppra can be discontinued in 3 months  Hypertension -Continue with amlodipine and as needed hydralazine -Continue monitor blood pressures per protocol -Last blood pressure reading was 109/69  Nausea and Vomiting -TF stopping and GI consulted and plan is for EGD in the AM  -New with antiemetics with ondansetron 4 mg p.o./IV every 6 as needed nausea as well as metoclopramide 10 mg every 8 hours scheduled  Morbid Obesity -Complicates overall prognosis and care -Estimated body mass index is 42.12 kg/m as calculated from the following:   Height as of this encounter: 4' 11"  (1.499 m).   Weight as of this encounter: 94.6 kg. -Weight Loss and Dietary Counseling given  -Nutritionist consulted and recommending J-tube placement given the delayed gastric emptying and persistent nausea vomiting and recommending continue TPN to meet 100% of the needs until enteral nutrition can be restarted.  Once enteral access is appropriate they are recommending vital 1.5 at 55 MLS per hour with Prosource tube feedings 45 mL twice daily  DVT prophylaxis: Enoxaparin 40 mg sq q24h Code Status: FULL CODE  Family Communication: Discussed with Husband at bedside  Disposition Plan: Pending further work-up and clearance by general surgery.  Disposition per primary but likely can be CIR  Status is: Inpatient  Remains inpatient appropriate because:Ongoing diagnostic testing needed not appropriate for outpatient work up, Unsafe d/c plan, IV  treatments appropriate due to intensity of illness or inability to take PO, and Inpatient level of care appropriate due to severity of illness  Dispo:  Patient From: Other  Planned Disposition: Inpatient Rehab  Medically stable for discharge: No          Consultants:  General Surgery PCCM Neurology  Gastroenterology   Procedures: ECHOCARDIOGRAM 11/02/20 IMPRESSIONS     1. Left ventricular ejection fraction, by estimation, is 65 to 70%. The  left ventricle has normal function. The left ventricle has no regional  wall motion abnormalities. Left ventricular diastolic parameters are  consistent with Grade II diastolic  dysfunction (pseudonormalization).   2. Right ventricular systolic function was not well visualized. The right  ventricular size is not well visualized. There is moderately elevated  pulmonary artery systolic pressure. The estimated right ventricular  systolic pressure is 02.4 mmHg.   3. The mitral valve is grossly normal. Trivial mitral valve  regurgitation. No evidence of mitral stenosis.   4. The aortic valve is tricuspid. There is mild calcification of the  aortic valve. Aortic valve regurgitation is trivial. Mild aortic valve  sclerosis is present, with no evidence of aortic valve stenosis.   Comparison(s): No significant change from prior study.   FINDINGS   Left Ventricle: Left ventricular ejection fraction, by estimation, is 65  to  70%. The left ventricle has normal function. The left ventricle has no  regional wall motion abnormalities. Definity contrast agent was given IV  to delineate the left ventricular   endocardial borders. The left ventricular internal cavity size was normal  in size. There is no left ventricular hypertrophy. Left ventricular  diastolic parameters are consistent with Grade II diastolic dysfunction  (pseudonormalization).   Right Ventricle: The right ventricular size is not well visualized. Right  vetricular wall thickness  was not well visualized. Right ventricular  systolic function was not well visualized. There is moderately elevated  pulmonary artery systolic pressure. The   tricuspid regurgitant velocity is 3.54 m/s, and with an assumed right  atrial pressure of 8 mmHg, the estimated right ventricular systolic  pressure is 29.4 mmHg.   Left Atrium: Left atrial size was normal in size.   Right Atrium: Right atrial size was not well visualized.   Pericardium: There is no evidence of pericardial effusion. Presence of  pericardial fat pad.   Mitral Valve: The mitral valve is grossly normal. Trivial mitral valve  regurgitation. No evidence of mitral valve stenosis.   Tricuspid Valve: The tricuspid valve is normal in structure. Tricuspid  valve regurgitation is trivial. No evidence of tricuspid stenosis.   Aortic Valve: The aortic valve is tricuspid. There is mild calcification  of the aortic valve. Aortic valve regurgitation is trivial. Mild aortic  valve sclerosis is present, with no evidence of aortic valve stenosis.   Pulmonic Valve: The pulmonic valve was not well visualized. Pulmonic valve  regurgitation is trivial. No evidence of pulmonic stenosis.   Aorta: The aortic root and ascending aorta are structurally normal, with  no evidence of dilitation.   Venous: IVC assessment for right atrial pressure unable to be performed  due to mechanical ventilation.   IAS/Shunts: The interatrial septum was not well visualized.      LEFT VENTRICLE  PLAX 2D  LVIDd:         4.40 cm     Diastology  LVIDs:         2.40 cm     LV e' medial:    6.65 cm/s  LV PW:         0.90 cm     LV E/e' medial:  13.9  LV IVS:        1.00 cm     LV e' lateral:   4.87 cm/s  LVOT diam:     1.70 cm     LV E/e' lateral: 19.0  LV SV:         37  LV SV Index:   19  LVOT Area:     2.27 cm     LV Volumes (MOD)  LV vol d, MOD A2C: 72.7 ml  LV vol d, MOD A4C: 81.3 ml  LV vol s, MOD A2C: 29.8 ml  LV vol s, MOD A4C: 15.8 ml   LV SV MOD A2C:     42.9 ml  LV SV MOD A4C:     81.3 ml  LV SV MOD BP:      55.4 ml   LEFT ATRIUM           Index  LA diam:      3.30 cm 1.70 cm/m  LA Vol (A4C): 54.5 ml 28.02 ml/m   AORTIC VALVE  LVOT Vmax:   86.70 cm/s  LVOT Vmean:  51.500 cm/s  LVOT VTI:    0.163 m     AORTA  Ao Root diam: 2.80 cm  Ao Asc diam:  3.10 cm   MITRAL VALVE               TRICUSPID VALVE  MV Area (PHT): 4.04 cm    TR Peak grad:   50.1 mmHg  MV Decel Time: 188 msec    TR Vmax:        354.00 cm/s  MV E velocity: 92.50 cm/s  MV A velocity: 93.90 cm/s  SHUNTS  MV E/A ratio:  0.99        Systemic VTI:  0.16 m                             Systemic Diam: 1.70 cm   EEG 11/08/20 ABNORMALITY - Continuous slow, generalized   IMPRESSION: This study is suggestive of moderate diffuse encephalopathy, nonspecific etiology. No seizures or epileptiform discharges were seen throughout the recording.   Including procedures, antibiotic start and stop dates in addition to other pertinent events  6/28 > admit, robotic distal gastrectomy, 7/5 > 6 minute code (asystole then PEA), severe hypoxemic respiratory failure and shock 7/5 levaquin x1 7/5 ceftriaxone > 7/6 7/5 doxycycline > 7/6 7/6 cefepime >  7/6 EEG > generalized slowing suggestive of severe diffuse encephalopathy non-specific in etiology 7/6 TTE > LVEF 65-70%, grade II diastolic dysfunction, RSVP elevated 58.1 mmHg, trivial MR 7/12 extubated    Antimicrobials:  Anti-infectives (From admission, onward)    Start     Dose/Rate Route Frequency Ordered Stop   11/04/20 1245  cefTRIAXone (ROCEPHIN) 2 g in sodium chloride 0.9 % 100 mL IVPB        2 g 200 mL/hr over 30 Minutes Intravenous Every 24 hours 11/04/20 1148 11/06/20 1302   11/02/20 1000  cefTRIAXone (ROCEPHIN) 2 g in sodium chloride 0.9 % 100 mL IVPB  Status:  Discontinued        2 g 200 mL/hr over 30 Minutes Intravenous Every 24 hours 11/01/20 1257 11/02/20 0856   11/02/20 1000  doxycycline  (VIBRAMYCIN) 100 mg in sodium chloride 0.9 % 250 mL IVPB  Status:  Discontinued        100 mg 125 mL/hr over 120 Minutes Intravenous Every 12 hours 11/01/20 1257 11/02/20 0856   11/02/20 1000  ceFEPIme (MAXIPIME) 2 g in sodium chloride 0.9 % 100 mL IVPB  Status:  Discontinued        2 g 200 mL/hr over 30 Minutes Intravenous Every 12 hours 11/02/20 0856 11/04/20 1148   11/01/20 0915  levofloxacin (LEVAQUIN) IVPB 750 mg  Status:  Discontinued        750 mg 100 mL/hr over 90 Minutes Intravenous Every 24 hours 11/01/20 0827 11/01/20 1257   11/01/20 0730  levofloxacin (LEVAQUIN) IVPB 500 mg  Status:  Discontinued        500 mg 100 mL/hr over 60 Minutes Intravenous Every 24 hours 11/01/20 0631 11/01/20 1257   10/03/2020 2100  ciprofloxacin (CIPRO) IVPB 400 mg        400 mg 200 mL/hr over 60 Minutes Intravenous Every 12 hours 09/28/2020 1258 10/15/2020 2148   10/21/2020 0600  ciprofloxacin (CIPRO) IVPB 400 mg        400 mg 200 mL/hr over 60 Minutes Intravenous On call to O.R. 10/11/2020 0547 10/04/2020 0847        Subjective: Seen and examined at bedside and she is doing okay.  Surgery planning on getting GI involved given her  emesis.  She feels okay.  No chest pain or shortness of breath.  Denies any specific complaints.  No other concerns or complaints at this time.  Objective: Vitals:   11/10/20 1400 11/10/20 1500 11/10/20 1600 11/10/20 1611  BP: (!) 153/62 (!) 149/50 109/69   Pulse: 74 84 (!) 59   Resp: (!) 24 (!) 21 (!) 22   Temp:    97.9 F (36.6 C)  TempSrc:    Oral  SpO2: 99% 98% 100%   Weight:      Height:        Intake/Output Summary (Last 24 hours) at 11/10/2020 1654 Last data filed at 11/10/2020 1100 Gross per 24 hour  Intake 715.33 ml  Output 750 ml  Net -34.67 ml   Filed Weights   11/08/20 0359 11/09/20 0322 11/10/20 0305  Weight: 93.2 kg 94.2 kg 94.6 kg   Examination: Physical Exam:  Constitutional: WN/WD morbidly obese Caucasian female in NAD Eyes: Lids and  conjunctivae normal, sclerae anicteric  ENMT: External Ears, Nose appear normal. Grossly normal hearing.  Has a core track in place currently Neck: Appears normal, supple, no cervical masses, normal ROM, no appreciable thyromegaly; no JVD Respiratory: Diminished to auscultation bilaterally, no wheezing, rales, rhonchi or crackles. Normal respiratory effort and patient is not tachypenic. No accessory muscle use.  Cardiovascular: RRR, no murmurs / rubs / gallops. S1 and S2 auscultated.  1+ extremity edema Abdomen: Soft, a little tender, distended secondary to body habitus. Bowel sounds positive.  GU: Deferred. Musculoskeletal: No clubbing / cyanosis of digits/nails. No joint deformity upper and lower extremities.  Skin: No rashes, lesions, ulcers on limited skin evaluation. No induration; Warm and dry.  Neurologic: CN 2-12 grossly intact with no focal deficits. Romberg sign and cerebellar reflexes not assessed.  Psychiatric: Normal judgment and insight. Alert and oriented x 3. Normal mood and appropriate affect.   Data Reviewed: I have personally reviewed following labs and imaging studies  CBC: Recent Labs  Lab 11/04/20 0553 11/05/20 0406 11/06/20 0432 11/07/20 0504 11/07/20 1841 11/07/20 1957 11/08/20 0217 11/09/20 0308 11/10/20 0256  WBC 21.0*   < > 20.1* 18.3*  --   --  10.1 12.5* 11.5*  NEUTROABS 16.2*  --   --   --   --   --   --   --   --   HGB 7.8*   < > 7.7* 7.2* 9.5* 7.8* 7.7* 8.1* 8.1*  HCT 24.8*   < > 26.0* 24.8* 28.0* 23.0* 26.0* 27.7* 28.0*  MCV 92.5   < > 98.1 98.4  --   --  100.4* 99.6 99.3  PLT 172   < > 177 206  --   --  232 318 343   < > = values in this interval not displayed.   Basic Metabolic Panel: Recent Labs  Lab 11/05/20 0406 11/05/20 1535 11/06/20 0432 11/07/20 0504 11/07/20 1841 11/07/20 1957 11/08/20 0217 11/08/20 1144 11/09/20 0308 11/10/20 0256  NA 140   < > 141 144 144 145 143  --  143 144  K 3.3*   < > 4.6 4.4 4.8 5.0 5.4* 4.5 4.5 4.0   CL 101   < > 107 106  --   --  110  --  108 111  CO2 31   < > 28 28  --   --  28  --  26 27  GLUCOSE 161*   < > 196* 147*  --   --  152*  --  209* 132*  BUN 23   < > 20 25*  --   --  28*  --  38* 37*  CREATININE 1.32*   < > 1.18* 1.34*  --   --  1.20*  --  1.15* 1.08*  CALCIUM 8.6*   < > 8.6* 9.4  --   --  9.0  --  9.0 9.1  MG 1.7  --  2.1  --   --   --  2.3  --  2.4 2.1  PHOS  --   --   --   --   --   --  4.8*  --  3.7 3.3   < > = values in this interval not displayed.   GFR: Estimated Creatinine Clearance: 43.9 mL/min (A) (by C-G formula based on SCr of 1.08 mg/dL (H)). Liver Function Tests: Recent Labs  Lab 11/04/20 0929 11/09/20 1435  AST 17 38  ALT 13 28  ALKPHOS 70 86  BILITOT 3.4* 2.0*  PROT 4.5* 6.5  ALBUMIN 1.5* 2.2*   No results for input(s): LIPASE, AMYLASE in the last 168 hours. Recent Labs  Lab 11/04/20 1230  AMMONIA 25   Coagulation Profile: No results for input(s): INR, PROTIME in the last 168 hours. Cardiac Enzymes: No results for input(s): CKTOTAL, CKMB, CKMBINDEX, TROPONINI in the last 168 hours. BNP (last 3 results) No results for input(s): PROBNP in the last 8760 hours. HbA1C: No results for input(s): HGBA1C in the last 72 hours. CBG: Recent Labs  Lab 11/09/20 2337 11/10/20 0408 11/10/20 0736 11/10/20 1132 11/10/20 1612  GLUCAP 138* 129* 125* 150* 122*   Lipid Profile: No results for input(s): CHOL, HDL, LDLCALC, TRIG, CHOLHDL, LDLDIRECT in the last 72 hours. Thyroid Function Tests: No results for input(s): TSH, T4TOTAL, FREET4, T3FREE, THYROIDAB in the last 72 hours. Anemia Panel: No results for input(s): VITAMINB12, FOLATE, FERRITIN, TIBC, IRON, RETICCTPCT in the last 72 hours. Sepsis Labs: No results for input(s): PROCALCITON, LATICACIDVEN in the last 168 hours.  Recent Results (from the past 240 hour(s))  MRSA Next Gen by PCR, Nasal     Status: None   Collection Time: 11/01/20  5:48 AM   Specimen: Nasal Mucosa; Nasal Swab  Result  Value Ref Range Status   MRSA by PCR Next Gen NOT DETECTED NOT DETECTED Final    Comment: (NOTE) The GeneXpert MRSA Assay (FDA approved for NASAL specimens only), is one component of a comprehensive MRSA colonization surveillance program. It is not intended to diagnose MRSA infection nor to guide or monitor treatment for MRSA infections. Test performance is not FDA approved in patients less than 9 years old. Performed at [facility], [place] [address], [place], [place] [address]   Culture, Respiratory w Gram Stain     Status: None   Collection Time: 11/01/20  6:34 AM   Specimen: Tracheal Aspirate; Respiratory  Result Value Ref Range Status   Specimen Description TRACHEAL ASPIRATE  Final   Special Requests NONE  Final   Gram Stain   Final    ABUNDANT WBC PRESENT, PREDOMINANTLY PMN MODERATE GRAM POSITIVE RODS FEW GRAM POSITIVE COCCI    Culture   Final    ABUNDANT Normal respiratory flora-no Staph aureus or Pseudomonas seen Performed at [facility], [address], [place], [place] [address]    Report Status 11/03/2020 FINAL  Final    RN Pressure Injury Documentation: Pressure Injury 11/03/20 Sacrum Mid Stage 2 -  Partial thickness loss of dermis presenting  as a shallow open injury with a red, pink wound bed without slough. (Active)  11/03/20 1000  Location: Sacrum  Location Orientation: Mid  Staging: Stage 2 -  Partial thickness loss of dermis presenting as a shallow open injury with a red, pink wound bed without slough.  Wound Description (Comments):   Present on Admission:     Estimated body mass index is 42.12 kg/m as calculated from the following:   Height as of this encounter: 4' 11"  (1.499 m).   Weight as of this encounter: 94.6 kg.  Malnutrition Type:  Nutrition Problem: Increased nutrient needs Etiology: post-op healing  Malnutrition Characteristics:  Signs/Symptoms: estimated needs  Nutrition Interventions:  Interventions: Tube feeding    Radiology Studies: DG Abd 1 View  Result Date: 11/10/2020 CLINICAL DATA:  Status post NG and feeding tube placement. EXAM: ABDOMEN - 1 VIEW COMPARISON:  Single-view of the abdomen 11/09/2020. FINDINGS: NG tube feeding tube are both within the stomach with their tips directed distally. Side port of the NG tube is also in the stomach. IMPRESSION: As above. Electronically Signed   By: Inge Rise M.D.   On: 11/10/2020 11:43   DG Abd 1 View  Result Date: 11/09/2020 CLINICAL DATA:  NG tube placement. EXAM: ABDOMEN - 1 VIEW COMPARISON:  CT November 07, 2020. FINDINGS: Enteric feeding catheter coursing below the diaphragm with tip in the mid left hemiabdomen, which given the altered anatomy from prior distal gastrectomy seen on recent CT is likely post pylorus. The bowel gas pattern is normal. Small left pleural effusion with atelectasis. IMPRESSION: Enteric feeding catheter coursing below the diaphragm with tip in the mid left hemiabdomen, which given the altered anatomy from prior distal gastrectomy is likely post pyloric. Electronically Signed   By: Dahlia Bailiff MD   On: 11/09/2020 16:02   [place] EKG SITE RITE  Result Date: 11/09/2020 If Site Rite image not attached, placement could not be confirmed due to current cardiac rhythm.   Scheduled Meds:  amLODipine  5 mg Per Tube Daily   bisacodyl  10 mg Rectal Daily   chlorhexidine gluconate (MEDLINE KIT)  15 mL Mouth Rinse BID   Chlorhexidine Gluconate Cloth  6 each Topical Daily   docusate  100 mg Per Tube BID   enoxaparin (LOVENOX) injection  40 mg Subcutaneous Q24H   feeding supplement (PROSource TF)  45 mL Per Tube BID   insulin aspart  0-15 Units Subcutaneous Q4H   [START ON 11/04/2020] levothyroxine  150 mcg Per Tube Q0600   metoCLOPramide (REGLAN) injection  10 mg Intravenous Q8H   pantoprazole (PROTONIX) IV  40 mg Intravenous Daily   polyethylene glycol  17 g Per Tube BID   Continuous Infusions:  sodium chloride 10 mL/hr at 11/02/20 0949    feeding supplement (VITAL 1.5 CAL) Stopped (11/09/20 1353)   levETIRAcetam Stopped (11/10/20 1023)   TPN ADULT (ION)      LOS: 16 days   Kerney Elbe, DO [facility] PAGER is on AMION  If 7PM-7AM, please contact night-coverage [email or web address]

## 2020-11-10 NOTE — Consult Note (Signed)
Reason for Consult: Nausea/vomiting and aspiration pneumonia Referring Physician: Faera Byerly, M.D.  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  The surgery was performed without complications and postoperatively she was well until her diet was advanced.  The pathology from the resection did not show any residual adenocarcinoma or any other suspicious findings.  She suffered with a cardiac arrest on 11/01/2020 secondary to aspiration pneumonia and she was able to achieve ROSC within a few minutes.  This required ICU care and intubation.  She required pressors, but ultimately she stabilized and improved.  Since that time any attempt to advance her diet only resulted in nausea and vomiting.  Her CT scan of the abdomen/pelvis on 11/07/2020 shows bowel well thickening at the gastrojejunostomy, but there was no evidence of any obstruction.  Contrast did pass through.  Past Medical History:  Diagnosis Date   Anxiety    CAD (coronary artery disease)     (Nonobstructive coronary artery disease 40% LAD stenosis)  2011   Cancer (HCC)    gastric   CHF (congestive heart failure) (HCC)    Chronic kidney disease    STAGE 4 KIDNEY DISEASE   Depression    DJD (degenerative joint disease)    DM (diabetes mellitus) (HCC)    GERD (gastroesophageal reflux disease)    H/O hiatal hernia    HTN (hypertension)    Hypothyroidism    OSA (obstructive sleep apnea)    doesn't wear CPAP   Stroke (HCC)     Past Surgical History:  Procedure Laterality Date   ABDOMINAL HYSTERECTOMY     BIOPSY  06/17/2020   Procedure: BIOPSY;  Surgeon: Shaquandra Galano, MD;  Location: [facility];  Service: Endoscopy;;   BIOPSY  07/01/2020   Procedure: BIOPSY;  Surgeon: Anjanette Gilkey, MD;  Location: [facility];  Service: Endoscopy;;   CARPAL TUNNEL  RELEASE     CERVICAL DISC SURGERY     ESOPHAGEAL DILATION  06/17/2020   Procedure: ESOPHAGEAL DILATION;  Surgeon: Becket Wecker, MD;  Location: [facility];  Service: Endoscopy;;   ESOPHAGOGASTRODUODENOSCOPY (EGD) WITH PROPOFOL N/A 06/17/2020   Procedure: ESOPHAGOGASTRODUODENOSCOPY (EGD) WITH PROPOFOL;  Surgeon: Copeland Lapier, MD;  Location: [facility];  Service: Endoscopy;  Laterality: N/A;   ESOPHAGOGASTRODUODENOSCOPY (EGD) WITH PROPOFOL N/A 07/01/2020   Procedure: ESOPHAGOGASTRODUODENOSCOPY (EGD) WITH PROPOFOL;  Surgeon: Myleen Brailsford, MD;  Location: [facility];  Service: Endoscopy;  Laterality: N/A;   HEMOSTASIS CLIP PLACEMENT  07/01/2020   Procedure: HEMOSTASIS CLIP PLACEMENT;  Surgeon: Janah Mcculloh, MD;  Location: [facility];  Service: Endoscopy;;   JOINT REPLACEMENT     POLYPECTOMY  07/01/2020   Procedure: POLYPECTOMY;  Surgeon: Taylor Levick, MD;  Location: [facility];  Service: Endoscopy;;   REPLACEMENT TOTAL KNEE Bilateral    SALPINGOOPHORECTOMY     SUBMUCOSAL INJECTION  07/01/2020   Procedure: SUBMUCOSAL INJECTION;  Surgeon: Heavenly , MD;  Location: [facility];  Service: Endoscopy;;   TONSILLECTOMY     UVULOPALATOPHARYNGOPLASTY      Family History  Problem Relation Age of Onset   Breast cancer Mother    Breast cancer Sister     Social History:  reports that she has never smoked. She has never used smokeless tobacco. She reports that she does not drink alcohol and does not use drugs.  Allergies:  Allergies  Allergen Reactions     Codeine Itching   Meperidine Hcl Itching   Morphine Itching   Nylon Other (See Comments)    Stitches cause infection   Penicillins Hives, Itching and Rash    Tolerated cefepime and ceftriaxone Has patient had a PCN reaction causing immediate rash, facial/tongue/throat swelling, SOB or lightheadedness with hypotension: Yes Has patient had a PCN reaction causing severe rash involving mucus membranes or skin necrosis: Yes Has  patient had a PCN reaction that required hospitalization: No Has patient had a PCN reaction occurring within the last 10 years: No If all of the above answers are "NO", then may proceed with Cephalosporin use.     Medications: Scheduled:  amLODipine  5 mg Per Tube Daily   bisacodyl  10 mg Rectal Daily   chlorhexidine gluconate (MEDLINE KIT)  15 mL Mouth Rinse BID   Chlorhexidine Gluconate Cloth  6 each Topical Daily   docusate  100 mg Per Tube BID   enoxaparin (LOVENOX) injection  40 mg Subcutaneous Q24H   feeding supplement (PROSource TF)  45 mL Per Tube BID   insulin aspart  0-15 Units Subcutaneous Q4H   [START ON 11/20/2020] levothyroxine  150 mcg Per Tube Q0600   metoCLOPramide (REGLAN) injection  10 mg Intravenous Q8H   pantoprazole (PROTONIX) IV  40 mg Intravenous Daily   polyethylene glycol  17 g Per Tube BID   Continuous:  sodium chloride 10 mL/hr at 11/02/20 0949   feeding supplement (VITAL 1.5 CAL) Stopped (11/09/20 1353)   levETIRAcetam Stopped (11/10/20 1023)   TPN ADULT (ION)      Results for orders placed or performed during the hospital encounter of 10/07/2020 (from the past 24 hour(s))  Glucose, capillary     Status: Abnormal   Collection Time: 11/09/20  3:46 PM  Result Value Ref Range   Glucose-Capillary 143 (H) 70 - 99 mg/dL  Glucose, capillary     Status: Abnormal   Collection Time: 11/09/20  8:33 PM  Result Value Ref Range   Glucose-Capillary 117 (H) 70 - 99 mg/dL  Glucose, capillary     Status: Abnormal   Collection Time: 11/09/20 11:37 PM  Result Value Ref Range   Glucose-Capillary 138 (H) 70 - 99 mg/dL  Basic metabolic panel     Status: Abnormal   Collection Time: 11/10/20  2:56 AM  Result Value Ref Range   Sodium 144 135 - 145 mmol/L   Potassium 4.0 3.5 - 5.1 mmol/L   Chloride 111 98 - 111 mmol/L   CO2 27 22 - 32 mmol/L   Glucose, Bld 132 (H) 70 - 99 mg/dL   BUN 37 (H) 8 - 23 mg/dL   Creatinine, Ser 1.08 (H) 0.44 - 1.00 mg/dL   Calcium 9.1 8.9 -  10.3 mg/dL   GFR, Estimated 53 (L) >60 mL/min   Anion gap 6 5 - 15  Magnesium     Status: None   Collection Time: 11/10/20  2:56 AM  Result Value Ref Range   Magnesium 2.1 1.7 - 2.4 mg/dL  Phosphorus     Status: None   Collection Time: 11/10/20  2:56 AM  Result Value Ref Range   Phosphorus 3.3 2.5 - 4.6 mg/dL  CBC     Status: Abnormal   Collection Time: 11/10/20  2:56 AM  Result Value Ref Range   WBC 11.5 (H) 4.0 - 10.5 K/uL   RBC 2.82 (L) 3.87 - 5.11 MIL/uL   Hemoglobin 8.1 (L) 12.0 - 15.0 g/dL   HCT 28.0 (L)   36.0 - 46.0 %   MCV 99.3 80.0 - 100.0 fL   MCH 28.7 26.0 - 34.0 pg   MCHC 28.9 (L) 30.0 - 36.0 g/dL   RDW 17.2 (H) 11.5 - 15.5 %   Platelets 343 150 - 400 K/uL   nRBC 0.0 0.0 - 0.2 %  Glucose, capillary     Status: Abnormal   Collection Time: 11/10/20  4:08 AM  Result Value Ref Range   Glucose-Capillary 129 (H) 70 - 99 mg/dL  Glucose, capillary     Status: Abnormal   Collection Time: 11/10/20  7:36 AM  Result Value Ref Range   Glucose-Capillary 125 (H) 70 - 99 mg/dL  Glucose, capillary     Status: Abnormal   Collection Time: 11/10/20 11:32 AM  Result Value Ref Range   Glucose-Capillary 150 (H) 70 - 99 mg/dL     DG Abd 1 View  Result Date: 11/10/2020 CLINICAL DATA:  Status post NG and feeding tube placement. EXAM: ABDOMEN - 1 VIEW COMPARISON:  Single-view of the abdomen 11/09/2020. FINDINGS: NG tube feeding tube are both within the stomach with their tips directed distally. Side port of the NG tube is also in the stomach. IMPRESSION: As above. Electronically Signed   By: Thomas  Dalessio M.D.   On: 11/10/2020 11:43   DG Abd 1 View  Result Date: 11/09/2020 CLINICAL DATA:  NG tube placement. EXAM: ABDOMEN - 1 VIEW COMPARISON:  CT November 07, 2020. FINDINGS: Enteric feeding catheter coursing below the diaphragm with tip in the mid left hemiabdomen, which given the altered anatomy from prior distal gastrectomy seen on recent CT is likely post pylorus. The bowel gas pattern  is normal. Small left pleural effusion with atelectasis. IMPRESSION: Enteric feeding catheter coursing below the diaphragm with tip in the mid left hemiabdomen, which given the altered anatomy from prior distal gastrectomy is likely post pyloric. Electronically Signed   By: Jeffrey  Waltz MD   On: 11/09/2020 16:02   US EKG SITE RITE  Result Date: 11/09/2020 If Site Rite image not attached, placement could not be confirmed due to current cardiac rhythm.   ROS:  As stated above in the HPI otherwise negative.  Blood pressure (!) 162/77, pulse 78, temperature 98.2 F (36.8 C), temperature source Oral, resp. rate (!) 22, height 4' 11" (1.499 m), weight 94.6 kg, SpO2 100 %.    PE: Gen: NAD, Alert and Oriented HEENT:  [place]/AT, EOMI Neck: Supple, no LAD Lungs: CTA Bilaterally CV: RRR without M/G/R ABD: Soft, NTND, +BS Ext: No C/C/E  Assessment/Plan: 1) Persistent nausea and vomiting. 2) Aspiration pneumonia. 3) S/p distal gastrectomy.   With the persistent nausea and vomiting and the findings of the bowel wall thickening at the anastomosis, an EGD will be performed.  It appears that the NG tube and feeding tube are out of position.  Plan: 1) EGD tomorrow at 1:30 PM.  Nelia Rogoff D 11/10/2020, 2:49 PM      

## 2020-11-11 ENCOUNTER — Encounter (HOSPITAL_COMMUNITY): Payer: Self-pay | Admitting: General Surgery

## 2020-11-11 ENCOUNTER — Inpatient Hospital Stay (HOSPITAL_COMMUNITY): Payer: Medicare PPO

## 2020-11-11 ENCOUNTER — Inpatient Hospital Stay (HOSPITAL_COMMUNITY): Payer: Medicare PPO | Admitting: Anesthesiology

## 2020-11-11 ENCOUNTER — Encounter (HOSPITAL_COMMUNITY): Admission: RE | Disposition: E | Payer: Self-pay | Source: Home / Self Care | Attending: General Surgery

## 2020-11-11 DIAGNOSIS — Z903 Acquired absence of stomach [part of]: Secondary | ICD-10-CM | POA: Diagnosis not present

## 2020-11-11 DIAGNOSIS — I469 Cardiac arrest, cause unspecified: Secondary | ICD-10-CM | POA: Diagnosis not present

## 2020-11-11 DIAGNOSIS — J9601 Acute respiratory failure with hypoxia: Secondary | ICD-10-CM | POA: Diagnosis not present

## 2020-11-11 DIAGNOSIS — R1111 Vomiting without nausea: Secondary | ICD-10-CM | POA: Diagnosis not present

## 2020-11-11 HISTORY — PX: ESOPHAGOGASTRODUODENOSCOPY (EGD) WITH PROPOFOL: SHX5813

## 2020-11-11 LAB — CBC WITH DIFFERENTIAL/PLATELET
Abs Immature Granulocytes: 0.27 10*3/uL — ABNORMAL HIGH (ref 0.00–0.07)
Basophils Absolute: 0 10*3/uL (ref 0.0–0.1)
Basophils Relative: 0 %
Eosinophils Absolute: 0 10*3/uL (ref 0.0–0.5)
Eosinophils Relative: 0 %
HCT: 29.3 % — ABNORMAL LOW (ref 36.0–46.0)
Hemoglobin: 8.5 g/dL — ABNORMAL LOW (ref 12.0–15.0)
Immature Granulocytes: 2 %
Lymphocytes Relative: 7 %
Lymphs Abs: 0.9 10*3/uL (ref 0.7–4.0)
MCH: 29 pg (ref 26.0–34.0)
MCHC: 29 g/dL — ABNORMAL LOW (ref 30.0–36.0)
MCV: 100 fL (ref 80.0–100.0)
Monocytes Absolute: 0.7 10*3/uL (ref 0.1–1.0)
Monocytes Relative: 6 %
Neutro Abs: 10.6 10*3/uL — ABNORMAL HIGH (ref 1.7–7.7)
Neutrophils Relative %: 85 %
Platelets: 368 10*3/uL (ref 150–400)
RBC: 2.93 MIL/uL — ABNORMAL LOW (ref 3.87–5.11)
RDW: 17 % — ABNORMAL HIGH (ref 11.5–15.5)
WBC: 12.5 10*3/uL — ABNORMAL HIGH (ref 4.0–10.5)
nRBC: 0 % (ref 0.0–0.2)

## 2020-11-11 LAB — GLUCOSE, CAPILLARY
Glucose-Capillary: 161 mg/dL — ABNORMAL HIGH (ref 70–99)
Glucose-Capillary: 191 mg/dL — ABNORMAL HIGH (ref 70–99)
Glucose-Capillary: 192 mg/dL — ABNORMAL HIGH (ref 70–99)
Glucose-Capillary: 198 mg/dL — ABNORMAL HIGH (ref 70–99)
Glucose-Capillary: 205 mg/dL — ABNORMAL HIGH (ref 70–99)
Glucose-Capillary: 230 mg/dL — ABNORMAL HIGH (ref 70–99)
Glucose-Capillary: 243 mg/dL — ABNORMAL HIGH (ref 70–99)

## 2020-11-11 LAB — COMPREHENSIVE METABOLIC PANEL
ALT: 25 U/L (ref 0–44)
AST: 31 U/L (ref 15–41)
Albumin: 2.1 g/dL — ABNORMAL LOW (ref 3.5–5.0)
Alkaline Phosphatase: 75 U/L (ref 38–126)
Anion gap: 5 (ref 5–15)
BUN: 34 mg/dL — ABNORMAL HIGH (ref 8–23)
CO2: 28 mmol/L (ref 22–32)
Calcium: 8.7 mg/dL — ABNORMAL LOW (ref 8.9–10.3)
Chloride: 114 mmol/L — ABNORMAL HIGH (ref 98–111)
Creatinine, Ser: 1.06 mg/dL — ABNORMAL HIGH (ref 0.44–1.00)
GFR, Estimated: 54 mL/min — ABNORMAL LOW (ref >60)
Glucose, Bld: 205 mg/dL — ABNORMAL HIGH (ref 70–99)
Potassium: 4.4 mmol/L (ref 3.5–5.1)
Sodium: 147 mmol/L — ABNORMAL HIGH (ref 135–145)
Total Bilirubin: 2.1 mg/dL — ABNORMAL HIGH (ref 0.3–1.2)
Total Protein: 6.3 g/dL — ABNORMAL LOW (ref 6.5–8.1)

## 2020-11-11 LAB — MAGNESIUM: Magnesium: 2.2 mg/dL (ref 1.7–2.4)

## 2020-11-11 LAB — PHOSPHORUS: Phosphorus: 3 mg/dL (ref 2.5–4.6)

## 2020-11-11 LAB — TRIGLYCERIDES: Triglycerides: 182 mg/dL — ABNORMAL HIGH (ref <150)

## 2020-11-11 LAB — PREALBUMIN: Prealbumin: 12.7 mg/dL — ABNORMAL LOW (ref 18–38)

## 2020-11-11 SURGERY — ESOPHAGOGASTRODUODENOSCOPY (EGD) WITH PROPOFOL
Anesthesia: Monitor Anesthesia Care

## 2020-11-11 MED ORDER — PROPOFOL 10 MG/ML IV BOLUS
INTRAVENOUS | Status: DC | PRN
  Administered 2020-11-11 (×6): 20 mg via INTRAVENOUS

## 2020-11-11 MED ORDER — TRAVASOL 10 % IV SOLN: INTRAVENOUS | Status: DC

## 2020-11-11 MED ORDER — LABETALOL HCL 5 MG/ML IV SOLN
10.0000 mg | INTRAVENOUS | Status: DC | PRN
  Administered 2020-11-13 (×2): 10 mg via INTRAVENOUS
  Filled 2020-11-11 (×2): qty 4

## 2020-11-11 MED ORDER — KETAMINE HCL-SODIUM CHLORIDE 100-0.9 MG/10ML-% IV SOSY
PREFILLED_SYRINGE | INTRAVENOUS | Status: DC | PRN
  Administered 2020-11-11: 30 mg via INTRAVENOUS
  Administered 2020-11-11: 20 mg via INTRAVENOUS

## 2020-11-11 MED ORDER — IPRATROPIUM-ALBUTEROL 0.5-2.5 (3) MG/3ML IN SOLN
RESPIRATORY_TRACT | Status: AC
  Administered 2020-11-11: 3 mL
  Filled 2020-11-11: qty 3

## 2020-11-11 MED ORDER — ORAL CARE MOUTH RINSE
15.0000 mL | Freq: Two times a day (BID) | OROMUCOSAL | Status: DC
  Administered 2020-11-12 – 2020-11-16 (×7): 15 mL via OROMUCOSAL

## 2020-11-11 MED ORDER — TRAVASOL 10 % IV SOLN
INTRAVENOUS | Status: AC
  Filled 2020-11-11 (×5): qty 1080

## 2020-11-11 MED ORDER — HYDRALAZINE HCL 10 MG PO TABS: 10.0000 mg | ORAL_TABLET | Freq: Four times a day (QID) | ORAL | Status: DC | PRN

## 2020-11-11 MED ORDER — HYDRALAZINE HCL 20 MG/ML IJ SOLN: 10.0000 mg | Freq: Four times a day (QID) | INTRAMUSCULAR | Status: DC | PRN

## 2020-11-11 MED ORDER — KETAMINE HCL 50 MG/5ML IJ SOSY
PREFILLED_SYRINGE | INTRAMUSCULAR | Status: AC
  Filled 2020-11-11: qty 5

## 2020-11-11 MED ORDER — INSULIN ASPART 100 UNIT/ML IJ SOLN
0.0000 [IU] | INTRAMUSCULAR | Status: DC
  Administered 2020-11-11: 4 [IU] via SUBCUTANEOUS
  Administered 2020-11-11 (×2): 7 [IU] via SUBCUTANEOUS
  Administered 2020-11-12: 4 [IU] via SUBCUTANEOUS
  Administered 2020-11-12: 7 [IU] via SUBCUTANEOUS
  Administered 2020-11-12 (×2): 4 [IU] via SUBCUTANEOUS
  Administered 2020-11-12 (×2): 7 [IU] via SUBCUTANEOUS
  Administered 2020-11-13 (×4): 4 [IU] via SUBCUTANEOUS
  Administered 2020-11-13: 7 [IU] via SUBCUTANEOUS
  Administered 2020-11-13 (×2): 4 [IU] via SUBCUTANEOUS
  Administered 2020-11-14: 7 [IU] via SUBCUTANEOUS
  Administered 2020-11-14 (×3): 4 [IU] via SUBCUTANEOUS
  Administered 2020-11-14: 7 [IU] via SUBCUTANEOUS
  Administered 2020-11-14 – 2020-11-15 (×2): 4 [IU] via SUBCUTANEOUS
  Administered 2020-11-15: 7 [IU] via SUBCUTANEOUS
  Administered 2020-11-15 (×2): 4 [IU] via SUBCUTANEOUS
  Administered 2020-11-15: 7 [IU] via SUBCUTANEOUS
  Administered 2020-11-16: 11 [IU] via SUBCUTANEOUS
  Administered 2020-11-16: 7 [IU] via SUBCUTANEOUS
  Administered 2020-11-16 (×3): 4 [IU] via SUBCUTANEOUS
  Administered 2020-11-16: 7 [IU] via SUBCUTANEOUS
  Administered 2020-11-17: 11 [IU] via SUBCUTANEOUS
  Administered 2020-11-17: 15 [IU] via SUBCUTANEOUS
  Administered 2020-11-17: 11 [IU] via SUBCUTANEOUS

## 2020-11-11 MED ORDER — ALBUTEROL SULFATE (2.5 MG/3ML) 0.083% IN NEBU: 2.5000 mg | INHALATION_SOLUTION | Freq: Once | RESPIRATORY_TRACT | Status: DC

## 2020-11-11 MED ORDER — SODIUM CHLORIDE 0.9 % IV SOLN: INTRAVENOUS | Status: DC

## 2020-11-11 MED ORDER — MIDAZOLAM HCL 5 MG/5ML IJ SOLN
INTRAMUSCULAR | Status: DC | PRN
  Administered 2020-11-11 (×2): 1 mg via INTRAVENOUS

## 2020-11-11 MED ORDER — AMLODIPINE BESYLATE 10 MG PO TABS
10.0000 mg | ORAL_TABLET | Freq: Every day | ORAL | Status: DC
  Administered 2020-11-11: 10 mg
  Filled 2020-11-11: qty 1

## 2020-11-11 SURGICAL SUPPLY — 15 items

## 2020-11-11 NOTE — Anesthesia Postprocedure Evaluation (Signed)
Anesthesia Post Note  Patient: [name]  Procedure(s) Performed: ESOPHAGOGASTRODUODENOSCOPY (EGD) WITH PROPOFOL     Patient location during evaluation: Endoscopy Anesthesia Type: MAC Level of consciousness: awake and alert Pain management: pain level controlled Vital Signs Assessment: post-procedure vital signs reviewed and stable Respiratory status: spontaneous breathing, nonlabored ventilation, respiratory function stable and patient connected to nasal cannula oxygen Cardiovascular status: blood pressure returned to baseline and stable Postop Assessment: no apparent nausea or vomiting Anesthetic complications: no   No notable events documented.  Last Vitals:  Vitals:   10/29/2020 1510 11/18/2020 1555  BP: (!) 173/62 (!) 152/79  Pulse: (!) 101 (!) 107  Resp: (!) 24 20  Temp:  (!) 36.4 C  SpO2: 91% 96%    Last Pain:  Vitals:   11/14/2020 1555  TempSrc: Oral  PainSc:                  Styles Fambro L Daeron Carreno

## 2020-11-11 NOTE — Op Note (Signed)
[facility] Patient Name: [name] Procedure Date : 11/12/2020 MRN: [id number] Attending MD: Carol Ada , MD Date of Birth: Jun 03, 1945 CSN: [id number] Age: 75 Admit Type: Inpatient Procedure:                Upper GI endoscopy Indications:              Nausea with vomiting Providers:                Carol Ada, MD, Doristine Johns, RN, Tyna Jaksch Technician Referring MD:              Medicines:                Propofol per Anesthesia Complications:            No immediate complications. Estimated Blood Loss:     Estimated blood loss: none. Procedure:                Pre-Anesthesia Assessment:                           - Prior to the procedure, a History and Physical                            was performed, and patient medications and                            allergies were reviewed. The patient's tolerance of                            previous anesthesia was also reviewed. The risks                            and benefits of the procedure and the sedation                            options and risks were discussed with the patient.                            All questions were answered, and informed consent                            was obtained. Prior Anticoagulants: The patient has                            taken no previous anticoagulant or antiplatelet                            agents. ASA Grade Assessment: III - A patient with                            severe systemic disease. After reviewing the risks  and benefits, the patient was deemed in                            satisfactory condition to undergo the procedure.                           - Sedation was administered by an anesthesia                            professional. Deep sedation was attained.                           After obtaining informed consent, the endoscope was                            passed under direct vision.  Throughout the                            procedure, the patient's blood pressure, pulse, and                            oxygen saturations were monitored continuously. The                            GIF-H190 ([id number] Olympus gastroscope was                            introduced through the mouth, and advanced to the                            afferent jejunal loop. The upper GI endoscopy was                            technically difficult and complex. The patient                            tolerated the procedure well. Scope In: Scope Out: Findings:      The esophagus was normal.      Evidence of an antrectomy was found in the gastric body. This was       characterized by edema.      A severe benign-appearing, intrinsic stenosis that was non-traversed was       found in the jejunum.      There was significant amount of bilious fluid in the remnant gastric       body and the fundus of the stomach. The fluid was evacuated and the NG       tube and feeding tube were noted to be coiled in the gastric lumen. Both       tips were not beyond the anastomosis. The gastrojejunostomy was       edematous, but it was patent and it was passed through with ease.       Preferentially the endoscope advanced into the afferent limb and this       was confirmed with finding the ampulla. The endoscope was retracted and       careful examination for  the efferent lumen was performed. A few       centimeters distal to the gastrojejunostomy some thick mucus was       identified. This was suctioned and the endoscope was advanced to this       area. Immediately an obstruction was encountered. Several small pinpoint       orifices were identified and air bubbles were noted to be coming out       (Image 8). The findings were relayed to Dr. Zenia Resides. Using a rat-toothed       forcep the NG tube was grasped in the gastric lumen and it was advanced       distally into the afferent limb. The procedure was then  concluded. Impression:               - Normal esophagus.                           - An antrectomy was found, characterized by edema.                           - Jejunal stenosis.                           - No specimens collected. Recommendation:           - Return patient to hospital ward for ongoing care.                           - NPO.                           - Continue present medications.                           - ? Surgical revision. Procedure Code(s):        --- Professional ---                           [phone], Esophagogastroduodenoscopy, flexible,                            transoral; diagnostic, including collection of                            specimen(s) by brushing or washing, when performed                            (separate procedure) Diagnosis Code(s):        --- Professional ---                           V37.106, Other intestinal obstruction unspecified                            as to partial versus complete obstruction                           Z90.3, Acquired absence of stomach [part of]  R11.2, Nausea with vomiting, unspecified CPT copyright 2019 [employer]. All rights reserved. The codes documented in this report are preliminary and upon coder review may  be revised to meet current compliance requirements. Carol Ada, MD Carol Ada, MD 11/09/2020 2:44:36 PM This report has been signed electronically. Number of Addenda: 0

## 2020-11-11 NOTE — Anesthesia Procedure Notes (Signed)
Procedure Name: MAC Date/Time: 11/17/2020 1:45 PM Performed by: Babs Bertin, CRNA Pre-anesthesia Checklist: Patient identified, Emergency Drugs available, Suction available, Patient being monitored and Timeout performed Patient Re-evaluated:Patient Re-evaluated prior to induction Oxygen Delivery Method: Nasal cannula

## 2020-11-11 NOTE — Progress Notes (Signed)
PROGRESS NOTE    [name]  AGT:[id number] DOB: October 20, 1945 DOA: 10/12/2020 PCP: Harlan Stains, MD   Brief Narrative:  The patient is a morbidly obese Caucasian female with a past medical history significant for but not limited to OSA, diabetes mellitus type 2, hypertension, CAD, history of allergic rhinitis and as well as other comorbidities who was admitted 10/04/2020 for elective robotic distal gastrectomy as surgical intervention for recently found EGD polyp of the gastric antrum with focal intramucosal adenocarcinoma.  She had the procedure done on 10/26/2019.  Pathology was negative malignancy but did show EGD with negative nodes.  Subsequently early a.m. she had wheezing and received a breathing treatment.  Shortly thereafter she was found with emesis all over the bed and sore.  She had gurgling sounds and altered Loss of consciousness.  She ended up having a loss of pulse with initial rhythm of asystole.  She received 1 mg epinephrine before PEA which she received a second dose of epinephrine.  She then had ROSC after 6 minutes and she was intubated during the code.  She was transferred to [facility] was asked to manage and assist with the vent.  On 7 5 she was initially started on Levaquin but then changed to ceftriaxone and then doxycycline and subsequently escalated to IV cefepime.  Given her code she underwent a EEG which showed generalized slowing of severe diffuse diffuse encephalopathy nonspecific etiology.  Transesophageal echocardiogram done which showed a LVEF of 65 to 70% and grade 2 diastolic dysfunction.  She subsequently extubated on 11/08/2020 and critical care signed off on 11/09/2020 however TRH was asked to pick up for medical management. BP was still elevated but will make further adjustments.   Assessment & Plan:   Active Problems:   S/P partial gastrectomy   Cardiac arrest (HCC)   Emesis   Respiratory failure (HCC)   SOB (shortness of breath)   Acute respiratory  failure with hypoxemia (HCC)   Encounter for central line placement   Pressure injury of skin  Septic shock from aspiration pneumonia and tingling vaso-occlusion from sedation and third spacing, improved and resolved -She is status post antibiotics and sepsis physiology has improved significantly -Her echocardiogram showed an EF of 65 to 70% but did show grade 2 diastolic dysfunction -WBC has gone from 10.1 -> 12.5 -> 11.5 -> 12.5 -Continue with Acetaminophen for mild pain or fever -Fluid hydration now discontinued but Surgery will try some IVF while off of TF and not getting TPN -Has Cortrak but still had Nausea and Vomiting  Acute respiratory failure with hypoxia in the setting of aspiration pneumonia -Cultures have been negative and she is completed her dose of IV antibiotics with ceftriaxone -She is now extubated and respiratory status improves -SLP still evaluating as she still has dysphagia -SpO2: 100 % O2 Flow Rate (L/min): 2 L/min FiO2 (%): 30 % -Continue with DuoNeb 3 mL every 4 hours as needed for wheezing or shortness of breath  Cardiac arrest secondary to likely aspiration -Achieved ROSC in 6 minutes -Echocardiogram done and showed an EF of 65 to 70% and grade 2 diastolic dysfunction -Continue to Monitor  Postop gastrectomy  -She did core track in the duodenum and had been tolerating tube feeds until she started vomiting.  -Because of her vomiting his tube feedings have been discontinued and GIs been consulted for EGD this will be done this AM -KUB done and showed Cortrak was in the Stomach so General Surgery recommending NOT to give Tube  Feedings -Per primary team  Dysphagia -In the setting of deconditioning -SLP working and she is now n.p.o. and will get TPN f through PICC line -Currently going to be switched over to TPN but 1 tube feedings are resumed they are recommending vital 1.5 Cal at 55 MLS per hour and Prosource tube feedings at 45 mL per 2 twice  daily -Patient's PreAlbumin was 12.7 -Patient getting an EGD today to evaluate GJ Anastamosis   CKD stage IIIa -Patient's BUN/Creatinine Is Now improving and 37/1.08 -> 34/1.06 -We Will Continue Monitor and Trend Avoid Nephrotoxic Medications, Contrast Dyes, Hypotension Renally Dose Medications -Repeat CMP in A.M.  Hypothyroidism -Patient's TSH was 5.572 with a free T4 of 0.47 and a free T3 of 0.6 -Currently getting levothyroxine 50 mcg per tube daily  Intermittent Twitching -She had intermittent twitching after her cardiac arrest and no events were captured on long-term EEG -EEG did not show any interictal and patient did not have any definitive seizures so neurology reduced her Keppra to 250 mg twice daily and recommended the patient remain seizure-free Keppra can be discontinued in 3 months  Hypertension -Continue with Amlodipine 10 mg and as needed Hydralazine changed to 10 mg IV q6hprn SBP >180 or DBP >100 -Continue monitor blood pressures per protocol -Last blood pressure reading was significantly elevated at 195/65 -If still remains elevated and will add Labetalol   Hyponatremia -Patient's Na+ is now 147 -Continue to Monitor and Trend and repeat CMP in the AM   Nausea and Vomiting -TF stopping and GI consulted and plan is for EGD this AM to evaluate GJ Anostomosis  -New with antiemetics with ondansetron 4 mg p.o./IV every 6 as needed nausea as well as metoclopramide 10 mg every 8 hours scheduled  Morbid Obesity -Complicates overall prognosis and care -Estimated body mass index is 42.08 kg/m as calculated from the following:   Height as of this encounter: _0  (1.499 m).   Weight as of this encounter: 94.5 kg. -Weight Loss and Dietary Counseling given  -Nutritionist consulted and recommending J-tube placement given the delayed gastric emptying and persistent nausea vomiting and recommending continue TPN to meet 100% of the needs until enteral nutrition can be restarted.   Once enteral access is appropriate they are recommending vital 1.5 at 55 MLS per hour with Prosource tube feedings 45 mL twice daily -PreAlbumin Level is 12.7  DVT prophylaxis: Enoxaparin 40 mg sq q24h Code Status: FULL CODE  Family Communication: Discussed with Husband at bedside  Disposition Plan: Pending further work-up and clearance by general surgery.  Disposition per primary but likely can be CIR  Status is: Inpatient  Remains inpatient appropriate because:Ongoing diagnostic testing needed not appropriate for outpatient work up, Unsafe d/c plan, IV treatments appropriate due to intensity of illness or inability to take PO, and Inpatient level of care appropriate due to severity of illness  Dispo:  Patient From: Other  Planned Disposition: Inpatient Rehab  Medically stable for discharge: No    Consultants:  General Surgery PCCM Neurology  Gastroenterology   Procedures: ECHOCARDIOGRAM 11/02/20 IMPRESSIONS     1. Left ventricular ejection fraction, by estimation, is 65 to 70%. The  left ventricle has normal function. The left ventricle has no regional  wall motion abnormalities. Left ventricular diastolic parameters are  consistent with Grade II diastolic  dysfunction (pseudonormalization).   2. Right ventricular systolic function was not well visualized. The right  ventricular size is not well visualized. There is moderately elevated  pulmonary artery systolic  pressure. The estimated right ventricular  systolic pressure is 65.4 mmHg.   3. The mitral valve is grossly normal. Trivial mitral valve  regurgitation. No evidence of mitral stenosis.   4. The aortic valve is tricuspid. There is mild calcification of the  aortic valve. Aortic valve regurgitation is trivial. Mild aortic valve  sclerosis is present, with no evidence of aortic valve stenosis.   Comparison(s): No significant change from prior study.   FINDINGS   Left Ventricle: Left ventricular ejection fraction, by  estimation, is 65  to 70%. The left ventricle has normal function. The left ventricle has no  regional wall motion abnormalities. Definity contrast agent was given IV  to delineate the left ventricular   endocardial borders. The left ventricular internal cavity size was normal  in size. There is no left ventricular hypertrophy. Left ventricular  diastolic parameters are consistent with Grade II diastolic dysfunction  (pseudonormalization).   Right Ventricle: The right ventricular size is not well visualized. Right  vetricular wall thickness was not well visualized. Right ventricular  systolic function was not well visualized. There is moderately elevated  pulmonary artery systolic pressure. The   tricuspid regurgitant velocity is 3.54 m/s, and with an assumed right  atrial pressure of 8 mmHg, the estimated right ventricular systolic  pressure is 65.0 mmHg.   Left Atrium: Left atrial size was normal in size.   Right Atrium: Right atrial size was not well visualized.   Pericardium: There is no evidence of pericardial effusion. Presence of  pericardial fat pad.   Mitral Valve: The mitral valve is grossly normal. Trivial mitral valve  regurgitation. No evidence of mitral valve stenosis.   Tricuspid Valve: The tricuspid valve is normal in structure. Tricuspid  valve regurgitation is trivial. No evidence of tricuspid stenosis.   Aortic Valve: The aortic valve is tricuspid. There is mild calcification  of the aortic valve. Aortic valve regurgitation is trivial. Mild aortic  valve sclerosis is present, with no evidence of aortic valve stenosis.   Pulmonic Valve: The pulmonic valve was not well visualized. Pulmonic valve  regurgitation is trivial. No evidence of pulmonic stenosis.   Aorta: The aortic root and ascending aorta are structurally normal, with  no evidence of dilitation.   Venous: IVC assessment for right atrial pressure unable to be performed  due to mechanical  ventilation.   IAS/Shunts: The interatrial septum was not well visualized.      LEFT VENTRICLE  PLAX 2D  LVIDd:         4.40 cm     Diastology  LVIDs:         2.40 cm     LV e' medial:    6.65 cm/s  LV PW:         0.90 cm     LV E/e' medial:  13.9  LV IVS:        1.00 cm     LV e' lateral:   4.87 cm/s  LVOT diam:     1.70 cm     LV E/e' lateral: 19.0  LV SV:         37  LV SV Index:   19  LVOT Area:     2.27 cm     LV Volumes (MOD)  LV vol d, MOD A2C: 72.7 ml  LV vol d, MOD A4C: 81.3 ml  LV vol s, MOD A2C: 29.8 ml  LV vol s, MOD A4C: 15.8 ml  LV SV MOD A2C:  42.9 ml  LV SV MOD A4C:     81.3 ml  LV SV MOD BP:      55.4 ml   LEFT ATRIUM           Index  LA diam:      3.30 cm 1.70 cm/m  LA Vol (A4C): 54.5 ml 28.02 ml/m   AORTIC VALVE  LVOT Vmax:   86.70 cm/s  LVOT Vmean:  51.500 cm/s  LVOT VTI:    0.163 m     AORTA  Ao Root diam: 2.80 cm  Ao Asc diam:  3.10 cm   MITRAL VALVE               TRICUSPID VALVE  MV Area (PHT): 4.04 cm    TR Peak grad:   50.1 mmHg  MV Decel Time: 188 msec    TR Vmax:        354.00 cm/s  MV E velocity: 92.50 cm/s  MV A velocity: 93.90 cm/s  SHUNTS  MV E/A ratio:  0.99        Systemic VTI:  0.16 m                             Systemic Diam: 1.70 cm   EEG 11/08/20 ABNORMALITY - Continuous slow, generalized   IMPRESSION: This study is suggestive of moderate diffuse encephalopathy, nonspecific etiology. No seizures or epileptiform discharges were seen throughout the recording.   Including procedures, antibiotic start and stop dates in addition to other pertinent events  6/28 > admit, robotic distal gastrectomy, 7/5 > 6 minute code (asystole then PEA), severe hypoxemic respiratory failure and shock 7/5 levaquin x1 7/5 ceftriaxone > 7/6 7/5 doxycycline > 7/6 7/6 cefepime >  7/6 EEG > generalized slowing suggestive of severe diffuse encephalopathy non-specific in etiology 7/6 TTE > LVEF 65-70%, grade II diastolic dysfunction, RSVP  elevated 58.1 mmHg, trivial MR 7/12 extubated    Antimicrobials:  Anti-infectives (From admission, onward)    Start     Dose/Rate Route Frequency Ordered Stop   11/04/20 1245  cefTRIAXone (ROCEPHIN) 2 g in sodium chloride 0.9 % 100 mL IVPB        2 g 200 mL/hr over 30 Minutes Intravenous Every 24 hours 11/04/20 1148 11/06/20 1302   11/02/20 1000  cefTRIAXone (ROCEPHIN) 2 g in sodium chloride 0.9 % 100 mL IVPB  Status:  Discontinued        2 g 200 mL/hr over 30 Minutes Intravenous Every 24 hours 11/01/20 1257 11/02/20 0856   11/02/20 1000  doxycycline (VIBRAMYCIN) 100 mg in sodium chloride 0.9 % 250 mL IVPB  Status:  Discontinued        100 mg 125 mL/hr over 120 Minutes Intravenous Every 12 hours 11/01/20 1257 11/02/20 0856   11/02/20 1000  ceFEPIme (MAXIPIME) 2 g in sodium chloride 0.9 % 100 mL IVPB  Status:  Discontinued        2 g 200 mL/hr over 30 Minutes Intravenous Every 12 hours 11/02/20 0856 11/04/20 1148   11/01/20 0915  levofloxacin (LEVAQUIN) IVPB 750 mg  Status:  Discontinued        750 mg 100 mL/hr over 90 Minutes Intravenous Every 24 hours 11/01/20 0827 11/01/20 1257   11/01/20 0730  levofloxacin (LEVAQUIN) IVPB 500 mg  Status:  Discontinued        500 mg 100 mL/hr over 60 Minutes Intravenous Every 24 hours 11/01/20 0631 11/01/20 1257  09/28/2020 2100  ciprofloxacin (CIPRO) IVPB 400 mg        400 mg 200 mL/hr over 60 Minutes Intravenous Every 12 hours 10/17/2020 1258 10/17/2020 2148   10/20/2020 0600  ciprofloxacin (CIPRO) IVPB 400 mg        400 mg 200 mL/hr over 60 Minutes Intravenous On call to O.R. 10/24/2020 0547 10/26/2020 0847        Subjective: Seen and examined at bedside and complaints.  Felt okay but blood pressure still little elevated.  No chest pain or shortness of breath.  Patient to undergo EGD today.  No other concerns or complaints at this time and surgery is transferring the patient to 4 N. progressive  Objective: Vitals:   11/10/2020 1200 11/03/2020 1300  10/30/2020 1309 11/19/2020 1313  BP: (!) 126/99 (!) 149/79  (!) 195/65  Pulse: 82 74 93   Resp: (!) 24 (!) 31 (!) 32   Temp:   98.8 F (37.1 C)   TempSrc:   Oral   SpO2: 95% 99% 100%   Weight:      Height:        Intake/Output Summary (Last 24 hours) at 11/15/2020 1417 Last data filed at 11/25/2020 1300 Gross per 24 hour  Intake 3805.47 ml  Output 2050 ml  Net 1755.47 ml    Filed Weights   11/09/20 0322 11/10/20 0305 10/29/2020 0500  Weight: 94.2 kg 94.6 kg 94.5 kg   Examination: Physical Exam:  Constitutional: WN/WD morbidly obese Caucasian female in NAD and appears calm and comfortable Eyes: Lids and conjunctivae normal, sclerae anicteric  ENMT: External Ears, Nose appear normal. Grossly normal hearing. Has Cortrak and NGT Neck: Appears normal, supple, no cervical masses, normal ROM, no appreciable thyromegaly; no JVD Respiratory: Diminished to auscultation bilaterally, no wheezing, rales, rhonchi or crackles. Normal respiratory effort and patient is not tachypenic. No accessory muscle use. No accessory muscles Cardiovascular: RRR, no murmurs / rubs / gallops. S1 and S2 auscultated. 1+ LE edema  Abdomen: Soft, non-tender, Distended 2/2 body habitus. Bowel sounds positive.  GU: Deferred. Musculoskeletal: No clubbing / cyanosis of digits/nails. No joint deformity upper and lower extremities.  Skin: No rashes, lesions, ulcers on a limited skin evaluation. No induration; Warm and dry.  Neurologic: CN 2-12 grossly intact with no focal deficits. Romberg sign and cerebellar reflexes not assessed.  Psychiatric: Normal judgment and insight. Alert and oriented x 3. Normal mood and appropriate affect.   Data Reviewed: I have personally reviewed following labs and imaging studies  CBC: Recent Labs  Lab 11/07/20 0504 11/07/20 1841 11/07/20 1957 11/08/20 0217 11/09/20 0308 11/10/20 0256 11/02/2020 0022  WBC 18.3*  --   --  10.1 12.5* 11.5* 12.5*  NEUTROABS  --   --   --   --   --   --   10.6*  HGB 7.2*   < > 7.8* 7.7* 8.1* 8.1* 8.5*  HCT 24.8*   < > 23.0* 26.0* 27.7* 28.0* 29.3*  MCV 98.4  --   --  100.4* 99.6 99.3 100.0  PLT 206  --   --  232 318 343 368   < > = values in this interval not displayed.    Basic Metabolic Panel: Recent Labs  Lab 11/06/20 0432 11/07/20 0504 11/07/20 1841 11/07/20 1957 11/08/20 0217 11/08/20 1144 11/09/20 0308 11/10/20 0256 11/13/2020 0022  NA 141 144   < > 145 143  --  143 144 147*  K 4.6 4.4   < > 5.0 5.4*  4.5 4.5 4.0 4.4  CL 107 106  --   --  110  --  108 111 114*  CO2 28 28  --   --  28  --  _0 GLUCOSE 196* 147*  --   --  152*  --  209* 132* 205*  BUN 20 25*  --   --  28*  --  38* 37* 34*  CREATININE 1.18* 1.34*  --   --  1.20*  --  1.15* 1.08* 1.06*  CALCIUM 8.6* 9.4  --   --  9.0  --  9.0 9.1 8.7*  MG 2.1  --   --   --  2.3  --  2.4 2.1 2.2  PHOS  --   --   --   --  4.8*  --  3.7 3.3 3.0   < > = values in this interval not displayed.    GFR: Estimated Creatinine Clearance: 44.7 mL/min (A) (by C-G formula based on SCr of 1.06 mg/dL (H)). Liver Function Tests: Recent Labs  Lab 11/09/20 1435 11/09/2020 0022  AST 38 31  ALT 28 25  ALKPHOS 86 75  BILITOT 2.0* 2.1*  PROT 6.5 6.3*  ALBUMIN 2.2* 2.1*    No results for input(s): LIPASE, AMYLASE in the last 168 hours. No results for input(s): AMMONIA in the last 168 hours.  Coagulation Profile: No results for input(s): INR, PROTIME in the last 168 hours. Cardiac Enzymes: No results for input(s): CKTOTAL, CKMB, CKMBINDEX, TROPONINI in the last 168 hours. BNP (last 3 results) No results for input(s): PROBNP in the last 8760 hours. HbA1C: No results for input(s): HGBA1C in the last 72 hours. CBG: Recent Labs  Lab 11/10/20 2334 11/02/2020 0411 11/10/2020 0740 11/12/2020 1116 11/23/2020 1314  GLUCAP 180* 192* 198* 243* 191*    Lipid Profile: Recent Labs    11/03/2020 0022  TRIG 182*   Thyroid Function Tests: No results for input(s): TSH, T4TOTAL, FREET4,  T3FREE, THYROIDAB in the last 72 hours. Anemia Panel: No results for input(s): VITAMINB12, FOLATE, FERRITIN, TIBC, IRON, RETICCTPCT in the last 72 hours. Sepsis Labs: No results for input(s): PROCALCITON, LATICACIDVEN in the last 168 hours.  No results found for this or any previous visit (from the past 240 hour(s)).   RN Pressure Injury Documentation: Pressure Injury 11/03/20 Sacrum Mid Stage 2 -  Partial thickness loss of dermis presenting as a shallow open injury with a red, pink wound bed without slough. (Active)  11/03/20 1000  Location: Sacrum  Location Orientation: Mid  Staging: Stage 2 -  Partial thickness loss of dermis presenting as a shallow open injury with a red, pink wound bed without slough.  Wound Description (Comments):   Present on Admission:     Estimated body mass index is 42.08 kg/m as calculated from the following:   Height as of this encounter: _1  (1.499 m).   Weight as of this encounter: 94.5 kg.  Malnutrition Type:  Nutrition Problem: Increased nutrient needs Etiology: post-op healing  Malnutrition Characteristics:  Signs/Symptoms: estimated needs  Nutrition Interventions:  Interventions: Tube feeding   Radiology Studies: DG Abd 1 View  Result Date: 11/10/2020 CLINICAL DATA:  Status post NG and feeding tube placement. EXAM: ABDOMEN - 1 VIEW COMPARISON:  Single-view of the abdomen 11/09/2020. FINDINGS: NG tube feeding tube are both within the stomach with their tips directed distally. Side port of the NG tube is also in the stomach. IMPRESSION: As above. Electronically Signed  By: Inge Rise M.D.   On: 11/10/2020 11:43    Scheduled Meds:  [MAR Hold] amLODipine  10 mg Per Tube Daily   [MAR Hold] bisacodyl  10 mg Rectal Daily   [MAR Hold] chlorhexidine gluconate (MEDLINE KIT)  15 mL Mouth Rinse BID   [MAR Hold] Chlorhexidine Gluconate Cloth  6 each Topical Daily   [MAR Hold] enoxaparin (LOVENOX) injection  40 mg Subcutaneous Q24H   [MAR  Hold] insulin aspart  0-20 Units Subcutaneous Q4H   [MAR Hold] levothyroxine  150 mcg Per Tube Q0600   [MAR Hold] metoCLOPramide (REGLAN) injection  10 mg Intravenous Q8H   [MAR Hold] pantoprazole (PROTONIX) IV  40 mg Intravenous Daily   [MAR Hold] sodium chloride flush  10-40 mL Intracatheter Q12H   Continuous Infusions:  sodium chloride 10 mL/hr at 11/20/2020 1343   feeding supplement (VITAL 1.5 CAL) Stopped (11/09/20 1353)   [MAR Hold] levETIRAcetam Stopped (10/28/2020 1150)   TPN ADULT (ION) 50 mL/hr at 10/30/2020 1300   TPN ADULT (ION)      LOS: 17 days   Kerney Elbe, DO [facility] PAGER is on AMION  If 7PM-7AM, please contact night-coverage [email or web address]

## 2020-11-11 NOTE — Anesthesia Preprocedure Evaluation (Addendum)
Anesthesia Evaluation  Patient identified by MRN, date of birth, ID band Patient awake    Reviewed: Allergy & Precautions, NPO status , Patient's Chart, lab work & pertinent test results  Airway Mallampati: III  TM Distance: >3 FB Neck ROM: Full    Dental  (+) Edentulous Upper, Edentulous Lower, Dental Advisory Given   Pulmonary sleep apnea (no CPAP) ,    Pulmonary exam normal breath sounds clear to auscultation       Cardiovascular hypertension, Pt. on medications + CAD and +CHF  Normal cardiovascular exam Rhythm:Regular Rate:Normal  TTE 2022 1. Left ventricular ejection fraction, by estimation, is 65 to 70%. The  left ventricle has normal function. The left ventricle has no regional  wall motion abnormalities. Left ventricular diastolic parameters are  consistent with Grade II diastolic  dysfunction (pseudonormalization).  2. Right ventricular systolic function was not well visualized. The right  ventricular size is not well visualized. There is moderately elevated  pulmonary artery systolic pressure. The estimated right ventricular  systolic pressure is 88.5 mmHg.  3. The mitral valve is grossly normal. Trivial mitral valve  regurgitation. No evidence of mitral stenosis.  4. The aortic valve is tricuspid. There is mild calcification of the  aortic valve. Aortic valve regurgitation is trivial. Mild aortic valve  sclerosis is present, with no evidence of aortic valve stenosis.   Stress Test 2022 ? The left ventricular ejection fraction is mildly decreased (45-54%). ? Nuclear stress EF: 50%. ? There was no ST segment deviation noted during stress. ? Findings consistent with prior myocardial infarction.   There is a medium size, fixed defect of moderate severity present in the basal anterior, mid anterior, apical anterior and apical (segment 17).  Mild hypokinesis in this distribution. On the rotating images there appears  to be breast shadowing in this same distribution, however cannot exclude a prior infarct. No ischemia on perfusion images.  This is an intermediate risk study concerning for anterior wall infarct.    Neuro/Psych Seizures - (on keppra),  PSYCHIATRIC DISORDERS Anxiety Depression TIACVA    GI/Hepatic Neg liver ROS, hiatal hernia, GERD  ,  Endo/Other  diabetes, Type 2, Oral Hypoglycemic AgentsHypothyroidism Morbid obesity (BMI 42)  Renal/GU Renal InsufficiencyRenal disease (Cr 1.06, K 4.4)  negative genitourinary   Musculoskeletal negative musculoskeletal ROS (+)   Abdominal   Peds  Hematology  (+) Blood dyscrasia (Hgb 8.5), anemia ,   Anesthesia Other Findings Gastric CA s/p gastrectomy. Post op course c/b cardiac arrest with ROSC after 6 minutes and aspiration pneumonia.   Reproductive/Obstetrics                           Anesthesia Physical Anesthesia Plan  ASA: 3  Anesthesia Plan: MAC   Post-op Pain Management:    Induction: Intravenous  PONV Risk Score and Plan: Propofol infusion and Treatment may vary due to age or medical condition  Airway Management Planned: Natural Airway  Additional Equipment:   Intra-op Plan:   Post-operative Plan:   Informed Consent: I have reviewed the patients History and Physical, chart, labs and discussed the procedure including the risks, benefits and alternatives for the proposed anesthesia with the patient or authorized representative who has indicated his/her understanding and acceptance.     Dental advisory given  Plan Discussed with: CRNA  Anesthesia Plan Comments:         Anesthesia Quick Evaluation

## 2020-11-11 NOTE — Progress Notes (Signed)
IP rehab admissions - I met with patient and her spouse.  Patient is not medically ready yet for inpatient rehab.  I will check back on Monday to see plans and progress.  I am not seeking authorization from insurance carrier yet because patient is not ready.  Call for questions.  [phone]

## 2020-11-11 NOTE — Progress Notes (Signed)
17 Days Post-Op   Subjective/Chief Complaint: No nausea or vomiting. NG functioning. UOP improved.   Objective: Vital signs in last 24 hours: Temp:  [97.9 F (36.6 C)-98.3 F (36.8 C)] 98.3 F (36.8 C) (07/15 0423) Pulse Rate:  [55-93] 75 (07/15 0700) Resp:  [19-29] 22 (07/15 0700) BP: (105-182)/(41-106) 150/106 (07/15 0700) SpO2:  [92 %-100 %] 100 % (07/15 0700) FiO2 (%):  [30 %] 30 % (07/15 0400) Weight:  [94.5 kg] 94.5 kg (07/15 0500) Last BM Date: 11/09/20  Intake/Output from previous day: 07/14 0701 - 07/15 0700 In: 3647.9 [I.V.:2794.9; NG/GT:650; IV Piggyback:203] Out: 1550 [Urine:1000; Emesis/NG output:550] Intake/Output this shift: No intake/output data recorded.  General: alert, NAD Heart: regular, rate, and rhythm.   Resp: nonlabored respirations GI - soft, non distended, non tender. Incisions clean and dry with no erythema or drainage. Heel protectors in place.    Lab Results:  Recent Labs    11/10/20 0256 11/22/2020 0022  WBC 11.5* 12.5*  HGB 8.1* 8.5*  HCT 28.0* 29.3*  PLT 343 368   BMET Recent Labs    11/10/20 0256 11/10/2020 0022  NA 144 147*  K 4.0 4.4  CL 111 114*  CO2 27 28  GLUCOSE 132* 205*  BUN 37* 34*  CREATININE 1.08* 1.06*  CALCIUM 9.1 8.7*   PT/INR No results for input(s): LABPROT, INR in the last 72 hours. ABG No results for input(s): PHART, HCO3 in the last 72 hours.  Invalid input(s): PCO2, PO2   Studies/Results: DG Abd 1 View  Result Date: 11/10/2020 CLINICAL DATA:  Status post NG and feeding tube placement. EXAM: ABDOMEN - 1 VIEW COMPARISON:  Single-view of the abdomen 11/09/2020. FINDINGS: NG tube feeding tube are both within the stomach with their tips directed distally. Side port of the NG tube is also in the stomach. IMPRESSION: As above. Electronically Signed   By: Inge Rise M.D.   On: 11/10/2020 11:43   DG Abd 1 View  Result Date: 11/09/2020 CLINICAL DATA:  NG tube placement. EXAM: ABDOMEN - 1 VIEW  COMPARISON:  CT November 07, 2020. FINDINGS: Enteric feeding catheter coursing below the diaphragm with tip in the mid left hemiabdomen, which given the altered anatomy from prior distal gastrectomy seen on recent CT is likely post pylorus. The bowel gas pattern is normal. Small left pleural effusion with atelectasis. IMPRESSION: Enteric feeding catheter coursing below the diaphragm with tip in the mid left hemiabdomen, which given the altered anatomy from prior distal gastrectomy is likely post pyloric. Electronically Signed   By: Dahlia Bailiff MD   On: 11/09/2020 16:02   [place] EKG SITE RITE  Result Date: 11/09/2020 If Site Rite image not attached, placement could not be confirmed due to current cardiac rhythm.   Anti-infectives: Anti-infectives (From admission, onward)    Start     Dose/Rate Route Frequency Ordered Stop   11/04/20 1245  cefTRIAXone (ROCEPHIN) 2 g in sodium chloride 0.9 % 100 mL IVPB        2 g 200 mL/hr over 30 Minutes Intravenous Every 24 hours 11/04/20 1148 11/06/20 1302   11/02/20 1000  cefTRIAXone (ROCEPHIN) 2 g in sodium chloride 0.9 % 100 mL IVPB  Status:  Discontinued        2 g 200 mL/hr over 30 Minutes Intravenous Every 24 hours 11/01/20 1257 11/02/20 0856   11/02/20 1000  doxycycline (VIBRAMYCIN) 100 mg in sodium chloride 0.9 % 250 mL IVPB  Status:  Discontinued  100 mg 125 mL/hr over 120 Minutes Intravenous Every 12 hours 11/01/20 1257 11/02/20 0856   11/02/20 1000  ceFEPIme (MAXIPIME) 2 g in sodium chloride 0.9 % 100 mL IVPB  Status:  Discontinued        2 g 200 mL/hr over 30 Minutes Intravenous Every 12 hours 11/02/20 0856 11/04/20 1148   11/01/20 0915  levofloxacin (LEVAQUIN) IVPB 750 mg  Status:  Discontinued        750 mg 100 mL/hr over 90 Minutes Intravenous Every 24 hours 11/01/20 0827 11/01/20 1257   11/01/20 0730  levofloxacin (LEVAQUIN) IVPB 500 mg  Status:  Discontinued        500 mg 100 mL/hr over 60 Minutes Intravenous Every 24 hours 11/01/20 0631  11/01/20 1257   10/12/2020 2100  ciprofloxacin (CIPRO) IVPB 400 mg        400 mg 200 mL/hr over 60 Minutes Intravenous Every 12 hours 10/04/2020 1258 09/28/2020 2148   10/12/2020 0600  ciprofloxacin (CIPRO) IVPB 400 mg        400 mg 200 mL/hr over 60 Minutes Intravenous On call to O.R. 10/02/2020 0547 10/09/2020 0847       Assessment/Plan: s/p Procedure(s): XI ROBOTIC ASSISTED DISTAL GASTRECTOMY (N/A)     Pathology from surgery 6/28, low grade dysplasia.  No cancer and 13 negative nodes.     Aspiration with loss of vital signs was witnessed and patient immediately received CPR and drugs with ROSC after a few minutes.  10/31/20 Shock - resolved VDRF - resolved.  ID - aspiration pneumonia- was on cefepime then ceftriaxone.  Now off.  Afebrile, WBC stable at 12.   Neuro- Pt mental status continues to improve. Still very weak.  Neurology weaning Keppra.  CV - Echo shows preserved ejection fraction and only grade 2 diastolic dysfunction.  Moderately elevated PA pressures.  HTN - restarting home meds.    GI - Continue TPN. KUB yesterday shows Dobhoff feeding tube is in the stomach - do NOT give tube feeds. Patient will have EGD today by Dr. Benson [place] to assess GJ anastomosis.  CIR consult.    Transfer to progressive care (4NP).    LOS: 17 days   Michaelle Birks, MD [facility] Surgery General, Hepatobiliary and Pancreatic Surgery 11/09/2020 7:32 AM

## 2020-11-11 NOTE — Progress Notes (Signed)
PHARMACY - TOTAL PARENTERAL NUTRITION CONSULT NOTE  Indication: Partial gastrectomy and TF intolerance   Patient Measurements: Height: 4\' 11"  (149.9 cm) Weight: 94.5 kg (208 lb 5.4 oz) IBW/kg (Calculated) : 43.2 TPN AdjBW (KG): 56.1 Body mass index is 42.08 kg/m. Usual Weight: 95 kg  Assessment:  75 yo W admitted for 6/28 elective distal gastrectomy for malignant polyp. Patient had a 6 minute code on 7/5 with severe respiratory failure and shock. Patient had an ileus and presumed functional gastric outlet obstruction post-op.  Patient tolerated TF 7/5-7/7 at 20 ml/hr, 7/7- 7/8 at 30 ml/hr, 7/9-7/10 at 40 ml/hr (goal 55 ml/hr). TF were held due to access issues then restarted 7/12 at 20-30 ml/hr. TF were stopped 7/13 for large volume bilious emesis that does not look like TF. Weight unchanged from usual but had anasarca earlier this admit, now resolved.   Glucose / Insulin: hx DM on glipizide PTA - CBGs elevated post TPN initiation.  Received 16 units insulin  Electrolytes: Na/CL up to 147/114, K 4.4 (SPS x1 7/12), CoCa high normal at 10.22 (none in TPN), others WNL Renal: SCr 1.06, BUN 34 GI/Hepatic: LFTs WNL, tbili 2.1 (sclera yellow per RN on 7/15), albumin 2.1, prealbumin low at 12.7, TG mildly elevated at 182 Intake / Output; MIVF:  UOP incompletely documented, NG 576mL, LBM x2 7/13 GI Imaging: 7/13 Abd xray NG tube placement 7/12 Cortrak advanced to small bowel 7/11 CT - wall thickening at [place] anastomosis, no evidence of outlet obstruction GI Surgeries / Procedures:  6/28 elective distal gastrectomy  Central access: triple IJ placed 11/01/20, PICC placed 11/10/20 TPN start date: 11/10/20  Nutritional Goals (per RD rec on 7/14): kCal: 1800-2000, Protein: 100-120, Fluid: >/= 1.8 L/d  Current Nutrition:  TPN Off TF 7/13  Plan:  Increase TPN to goal rate 75 ml/hr at 1800 TPN will provide 108g AA, 252g CHO and 54g ILE for a total of 1829 kCal, meeting 100% of patient  needs Electrolytes in TPN: decrease Na 74mEq/L, K 42mEq/L, Ca 29mEq/L, Mg 14mEq/L, Phos 7mmol/L, Cl:Ac 1:2 - lytes adjusted with increased TPN rate Add standard MVI and 1/2 trace elements to TPN Increase SSI to resistant Q4H and add 20 units regular insulin to TPN Monitor TPN labs daily until goal then on Mon/Thurs, CBGs, tbili/jaundice  F/u EGD and ability to restart TF  Narciso Stoutenburg D. Mina Marble, PharmD, BCPS, [place] 11/10/2020, 8:29 AM

## 2020-11-11 NOTE — Progress Notes (Signed)
SLP Cancellation Note  Patient Details Name: [name] MRN: [id number] DOB: [birth year]   Cancelled treatment:       Reason Eval/Treat Not Completed: Medical issues which prohibited therapy. Pt with N/V and now planned for EGD this afternoon. Discussed with RN - will plan to f/u on subsequent date depending on findings and recommendations from GI.     Osie Bond., M.A. [facility] Pager [phone] Office [phone]  11/10/2020, 8:13 AM

## 2020-11-11 NOTE — Transfer of Care (Signed)
Immediate Anesthesia Transfer of Care Note  Patient: [name]  Procedure(s) Performed: ESOPHAGOGASTRODUODENOSCOPY (EGD) WITH PROPOFOL  Patient Location: Endoscopy Unit  Anesthesia Type:MAC  Level of Consciousness: responds to stimulation  Airway & Oxygen Therapy: Patient Spontanous Breathing and Patient connected to nasal cannula oxygen  Post-op Assessment: Report given to RN and Post -op Vital signs reviewed and stable  Post vital signs: Reviewed and stable  Last Vitals:  Vitals Value Taken Time  BP    Temp    Pulse 110 11/15/2020 1424  Resp 21 11/24/2020 1424  SpO2 100 % 11/25/2020 1424  Vitals shown include unvalidated device data.  Last Pain:  Vitals:   11/20/2020 1309  TempSrc: Oral  PainSc: 0-No pain      Patients Stated Pain Goal: 0 (93/79/02 4097)  Complications: No notable events documented.

## 2020-11-11 NOTE — Progress Notes (Addendum)
Occupational Therapy Treatment Patient Details Name: [name] MRN: [id number] DOB: 01/04/1946 Today's Date: 11/06/2020    History of present illness Pt is a 75 y/o female admitted 6/28 for elective robotic distal gastrectomy, complicated by 6 minute code (asystole then PEA) on 7/5.  EEG 7/6 with severe diffuse encephalopathy. 7/10 MRI negative.  7/12 extubated. PMH: obesity, hiatal hernia, CHF, CAD, h/o CVA.   OT comments  Patient in chair position upon entry, RN cleared for bed level session due to procedure today.  Patient completing grooming tasks with min assist, cueing for problem solving, sequencing and task attention.  She has significant tremors with functional tasks, inconsistent report if these were present at baseline (spouse reports they are new this session, pt reports they were present last session).  She was re-oriented to time during session, able to recall month/year at end of session.  Fatigues easily and will need intensive rehab, continue to recommend CIR.    Follow Up Recommendations  CIR;Supervision/Assistance - 24 hour    Equipment Recommendations  3 in 1 bedside commode    Recommendations for Other Services      Precautions / Restrictions Precautions Precautions: Fall Precaution Comments: cortrak, NG Restrictions Weight Bearing Restrictions: No       Mobility Bed Mobility               General bed mobility comments: upright in chair position    Transfers                      Balance                                           ADL either performed or assessed with clinical judgement   ADL Overall ADL's : Needs assistance/impaired     Grooming: Wash/dry hands;Oral care;Minimal assistance;Bed level Grooming Details (indicate cue type and reason): sitting in chair position in bed, min assist for oral care and washing hands/applying lotion givne increased time due to B UE tremors during functional tasks                                General ADL Comments: focused on bed level ADLs today, requires increased time for problem solving and sequencing     Vision       Perception     Praxis      Cognition Arousal/Alertness: Awake/alert Behavior During Therapy: Anxious Overall Cognitive Status: Impaired/Different from baseline Area of Impairment: Orientation;Following commands;Memory;Safety/judgement;Awareness;Problem solving;Attention                 Orientation Level: Disoriented to;Time Current Attention Level: Sustained Memory: Decreased short-term memory;Decreased recall of precautions Following Commands: Follows one step commands consistently;Follows one step commands with increased time;Follows multi-step commands inconsistently Safety/Judgement: Decreased awareness of safety;Decreased awareness of deficits Awareness: Emergent Problem Solving: Slow processing;Requires verbal cues General Comments: pt remains disoriented to month and year (reports feb 2002), but reoriented and able to recall at 2 minute and 10 minutes post reorientation.  She requires increased time to sequence and problem solve.        Exercises     Shoulder Instructions       General Comments VSS on 2L during session    Pertinent Vitals/ Pain       Pain Assessment: No/denies pain  Home Living                                          Prior Functioning/Environment              Frequency  Min 2X/week        Progress Toward Goals  OT Goals(current goals can now be found in the care plan section)  Progress towards OT goals: Progressing toward goals  Acute Rehab OT Goals Patient Stated Goal: get home OT Goal Formulation: With patient  Plan Discharge plan remains appropriate;Frequency remains appropriate    Co-evaluation                 AM-PAC OT "6 Clicks" Daily Activity     Outcome Measure   Help from another person eating meals?: Total Help  from another person taking care of personal grooming?: A Little Help from another person toileting, which includes using toliet, bedpan, or urinal?: Total Help from another person bathing (including washing, rinsing, drying)?: A Lot Help from another person to put on and taking off regular upper body clothing?: A Lot Help from another person to put on and taking off regular lower body clothing?: Total 6 Click Score: 10    End of Session Equipment Utilized During Treatment: Oxygen  OT Visit Diagnosis: Other abnormalities of gait and mobility (R26.89);Muscle weakness (generalized) (M62.81);Other symptoms and signs involving cognitive function   Activity Tolerance Patient tolerated treatment well   Patient Left in bed;with call bell/phone within reach;with bed alarm set;with family/visitor present   Nurse Communication Mobility status        Time: 0569-7948 OT Time Calculation (min): 17 min  Charges: OT General Charges $OT Visit: 1 Visit OT Treatments $Self Care/Home Management : 8-22 mins  Jolaine Artist, OT [place] Pager [phone] Office [phone]    Delight Stare 11/02/2020, 10:28 AM

## 2020-11-11 NOTE — Progress Notes (Signed)
Patient has NG and feeding tube in nose. Will hold BIPAP for tonight and monitor. Patient agrees with course. In no distress and O2 sats are 95% with 2 lpm nasal cannula.

## 2020-11-11 NOTE — Interval H&P Note (Signed)
History and Physical Interval Note:  11/04/2020 1:27 PM  [name]  has presented today for surgery, with the diagnosis of Nausea and vomiting.  The various methods of treatment have been discussed with the patient and family. After consideration of risks, benefits and other options for treatment, the patient has consented to  Procedure(s): ESOPHAGOGASTRODUODENOSCOPY (EGD) WITH PROPOFOL (N/A) as a surgical intervention.  The patient's history has been reviewed, patient examined, no change in status, stable for surgery.  I have reviewed the patient's chart and labs.  Questions were answered to the patient's satisfaction.     Lavaughn Haberle D

## 2020-11-12 DIAGNOSIS — I469 Cardiac arrest, cause unspecified: Secondary | ICD-10-CM | POA: Diagnosis not present

## 2020-11-12 DIAGNOSIS — J9601 Acute respiratory failure with hypoxia: Secondary | ICD-10-CM | POA: Diagnosis not present

## 2020-11-12 DIAGNOSIS — Z903 Acquired absence of stomach [part of]: Secondary | ICD-10-CM | POA: Diagnosis not present

## 2020-11-12 DIAGNOSIS — R1111 Vomiting without nausea: Secondary | ICD-10-CM | POA: Diagnosis not present

## 2020-11-12 LAB — CBC WITH DIFFERENTIAL/PLATELET
Abs Immature Granulocytes: 0.34 10*3/uL — ABNORMAL HIGH (ref 0.00–0.07)
Basophils Absolute: 0 10*3/uL (ref 0.0–0.1)
Basophils Relative: 0 %
Eosinophils Absolute: 0 10*3/uL (ref 0.0–0.5)
Eosinophils Relative: 0 %
HCT: 30 % — ABNORMAL LOW (ref 36.0–46.0)
Hemoglobin: 8.7 g/dL — ABNORMAL LOW (ref 12.0–15.0)
Immature Granulocytes: 2 %
Lymphocytes Relative: 3 %
Lymphs Abs: 0.7 10*3/uL (ref 0.7–4.0)
MCH: 28.8 pg (ref 26.0–34.0)
MCHC: 29 g/dL — ABNORMAL LOW (ref 30.0–36.0)
MCV: 99.3 fL (ref 80.0–100.0)
Monocytes Absolute: 0.8 10*3/uL (ref 0.1–1.0)
Monocytes Relative: 4 %
Neutro Abs: 17.7 10*3/uL — ABNORMAL HIGH (ref 1.7–7.7)
Neutrophils Relative %: 91 %
Platelets: 320 10*3/uL (ref 150–400)
RBC: 3.02 MIL/uL — ABNORMAL LOW (ref 3.87–5.11)
RDW: 17.2 % — ABNORMAL HIGH (ref 11.5–15.5)
WBC: 19.6 10*3/uL — ABNORMAL HIGH (ref 4.0–10.5)
nRBC: 0 % (ref 0.0–0.2)

## 2020-11-12 LAB — GLUCOSE, CAPILLARY
Glucose-Capillary: 161 mg/dL — ABNORMAL HIGH (ref 70–99)
Glucose-Capillary: 181 mg/dL — ABNORMAL HIGH (ref 70–99)
Glucose-Capillary: 184 mg/dL — ABNORMAL HIGH (ref 70–99)
Glucose-Capillary: 199 mg/dL — ABNORMAL HIGH (ref 70–99)
Glucose-Capillary: 205 mg/dL — ABNORMAL HIGH (ref 70–99)
Glucose-Capillary: 226 mg/dL — ABNORMAL HIGH (ref 70–99)

## 2020-11-12 LAB — COMPREHENSIVE METABOLIC PANEL
ALT: 23 U/L (ref 0–44)
AST: 22 U/L (ref 15–41)
Albumin: 1.9 g/dL — ABNORMAL LOW (ref 3.5–5.0)
Alkaline Phosphatase: 63 U/L (ref 38–126)
Anion gap: 7 (ref 5–15)
BUN: 37 mg/dL — ABNORMAL HIGH (ref 8–23)
CO2: 24 mmol/L (ref 22–32)
Calcium: 8.3 mg/dL — ABNORMAL LOW (ref 8.9–10.3)
Chloride: 116 mmol/L — ABNORMAL HIGH (ref 98–111)
Creatinine, Ser: 1.05 mg/dL — ABNORMAL HIGH (ref 0.44–1.00)
GFR, Estimated: 55 mL/min — ABNORMAL LOW (ref >60)
Glucose, Bld: 190 mg/dL — ABNORMAL HIGH (ref 70–99)
Potassium: 3.8 mmol/L (ref 3.5–5.1)
Sodium: 147 mmol/L — ABNORMAL HIGH (ref 135–145)
Total Bilirubin: 1.9 mg/dL — ABNORMAL HIGH (ref 0.3–1.2)
Total Protein: 5.9 g/dL — ABNORMAL LOW (ref 6.5–8.1)

## 2020-11-12 LAB — MAGNESIUM: Magnesium: 2.2 mg/dL (ref 1.7–2.4)

## 2020-11-12 LAB — PHOSPHORUS: Phosphorus: 3 mg/dL (ref 2.5–4.6)

## 2020-11-12 MED ORDER — TRAVASOL 10 % IV SOLN
INTRAVENOUS | Status: AC
  Filled 2020-11-12: qty 1080

## 2020-11-12 MED ORDER — CHLORHEXIDINE GLUCONATE 0.12% ORAL RINSE (MEDLINE KIT)
15.0000 mL | Freq: Two times a day (BID) | OROMUCOSAL | Status: DC
  Administered 2020-11-12 – 2020-11-14 (×5): 15 mL via OROMUCOSAL

## 2020-11-12 MED ORDER — ACETAMINOPHEN 160 MG/5ML PO SOLN
650.0000 mg | Freq: Four times a day (QID) | ORAL | Status: DC | PRN
  Administered 2020-11-13: 650 mg
  Filled 2020-11-12: qty 20.3

## 2020-11-12 MED ORDER — ORAL CARE MOUTH RINSE
15.0000 mL | OROMUCOSAL | Status: DC
  Administered 2020-11-12 – 2020-11-15 (×16): 15 mL via OROMUCOSAL

## 2020-11-12 NOTE — Progress Notes (Signed)
RT NOTES: Pt found off of bipap and on nasal cannula. Tolerating well at this time. Sats 95%. Will continue to monitor.

## 2020-11-12 NOTE — Progress Notes (Signed)
Pt placed on bipap for the night. °

## 2020-11-12 NOTE — Progress Notes (Signed)
SLP Cancellation Note  Patient Details Name: [name] MRN: [id number] DOB: 01/06/1946   Cancelled treatment:        Results of EGD reviewed. 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 Surgical revision, NPO.  Surgery MD's note today states continue TNA for now.   ST will follow up for plan.  Houston Siren 11/12/2020, 10:08 AM

## 2020-11-12 NOTE — Progress Notes (Signed)
Subjective: No acute events.  No nausea/vomiting.  Objective: Vital signs in last 24 hours: Temp:  [97.2 F (36.2 C)-99 F (37.2 C)] 99 F (37.2 C) (07/16 1123) Pulse Rate:  [89-123] 101 (07/16 1123) Resp:  [17-38] 28 (07/16 1123) BP: (119-195)/(46-135) 151/73 (07/16 1123) SpO2:  [90 %-100 %] 94 % (07/16 1123) Weight:  [95.2 kg] 95.2 kg (07/16 0458) Last BM Date: 10/28/2020  Intake/Output from previous day: 07/15 0701 - 07/16 0700 In: 1428.1 [I.V.:1325.1; IV Piggyback:103] Out: 575 [Urine:575] Intake/Output this shift: No intake/output data recorded.  General appearance: alert and weak GI: soft, non-tender; bowel sounds normal; no masses,  no organomegaly  Lab Results: Recent Labs    11/10/20 0256 11/14/2020 0022 11/12/20 0242  WBC 11.5* 12.5* 19.6*  HGB 8.1* 8.5* 8.7*  HCT 28.0* 29.3* 30.0*  PLT 343 368 320   BMET Recent Labs    11/10/20 0256 11/13/2020 0022 11/12/20 0242  NA 144 147* 147*  K 4.0 4.4 3.8  CL 111 114* 116*  CO2 27 28 24   GLUCOSE 132* 205* 190*  BUN 37* 34* 37*  CREATININE 1.08* 1.06* 1.05*  CALCIUM 9.1 8.7* 8.3*   LFT Recent Labs    11/09/20 1435 11/08/2020 0022 11/12/20 0242  PROT 6.5   < > 5.9*  ALBUMIN 2.2*   < > 1.9*  AST 38   < > 22  ALT 28   < > 23  ALKPHOS 86   < > 63  BILITOT 2.0*   < > 1.9*  BILIDIR 1.1*  --   --   IBILI 0.9  --   --    < > = values in this interval not displayed.   PT/INR No results for input(s): LABPROT, INR in the last 72 hours. Hepatitis Panel No results for input(s): HEPBSAG, HCVAB, HEPAIGM, HEPBIGM in the last 72 hours. C-Diff No results for input(s): CDIFFTOX in the last 72 hours. Fecal Lactopherrin No results for input(s): FECLLACTOFRN in the last 72 hours.  Studies/Results: DG Chest Port 1 View  Result Date: 11/18/2020 CLINICAL DATA:  Post EGD EXAM: PORTABLE CHEST 1 VIEW COMPARISON:  11/07/2020 FINDINGS: NG tube in the stomach. Feeding tube enters the stomach with the tip not visualized.  Endotracheal tube removed since the prior study. Improvement in right lower lobe airspace disease. No change in left lower lobe airspace disease. Small left effusion is present. Cardiac enlargement without heart failure or edema. IMPRESSION: Improvement in right lower lobe airspace disease. No change in left lower lobe airspace disease and small left effusion Interval removal of endotracheal tube. Electronically Signed   By: Franchot Gallo M.D.   On: 11/13/2020 15:13    Medications: Scheduled:  albuterol  2.5 mg Nebulization Once   bisacodyl  10 mg Rectal Daily   Chlorhexidine Gluconate Cloth  6 each Topical Daily   enoxaparin (LOVENOX) injection  40 mg Subcutaneous Q24H   insulin aspart  0-20 Units Subcutaneous Q4H   levothyroxine  150 mcg Per Tube Q0600   mouth rinse  15 mL Mouth Rinse BID   metoCLOPramide (REGLAN) injection  10 mg Intravenous Q8H   pantoprazole (PROTONIX) IV  40 mg Intravenous Daily   sodium chloride flush  10-40 mL Intracatheter Q12H   Continuous:  sodium chloride 400 mL/hr at 11/03/2020 1419   sodium chloride 20 mL/hr at 11/18/2020 2200   levETIRAcetam 250 mg (11/12/20 0913)   TPN ADULT (ION) 75 mL/hr at 11/10/2020 1713   TPN ADULT (ION)  Assessment/Plan: 1) Obstructed jejunal efferent limb. 2) Nausea/vomiting secondary to #1.   The patient is stable.  No respiratory issues overnight.  She had some desaturation after the EGD, but this appears to have resolved.  Plan: 1) Agree with TPN. 2) Management per surgery.  LOS: 18 days   Isai Gottlieb D 11/12/2020, 1:11 PM

## 2020-11-12 NOTE — Progress Notes (Signed)
PHARMACY - TOTAL PARENTERAL NUTRITION CONSULT NOTE  Indication: Partial gastrectomy and TF intolerance   Patient Measurements: Height: 4\' 11"  (149.9 cm) Weight: 95.2 kg (209 lb 14.1 oz) IBW/kg (Calculated) : 43.2 TPN AdjBW (KG): 56.1 Body mass index is 42.39 kg/m. Usual Weight: 95 kg  Assessment:  75 yo W admitted for 6/28 elective distal gastrectomy for malignant polyp. Patient had a 6 minute code on 7/5 with severe respiratory failure and shock. Patient had an ileus and presumed functional gastric outlet obstruction post-op.  Patient tolerated TF 7/5-7/7 at 20 ml/hr, 7/7- 7/8 at 30 ml/hr, 7/9-7/10 at 40 ml/hr (goal 55 ml/hr). TF were held due to access issues then restarted 7/12 at 20-30 ml/hr. TF were stopped 7/13 for large volume bilious emesis that does not look like TF. Weight unchanged from usual but had anasarca earlier this admit, now resolved.   Glucose / Insulin: hx DM on glipizide PTA - CBGs elevated post TPN initiation and now improving with insulin added to TPN.  Received 27 units insulin in past 24 hrs + 20 units in TPN Electrolytes: Na/CL 147/116, K 3.8 (SPS x1 7/12), CoCa down to 9.98 (none in TPN), others WNL Renal: SCr 1.05, BUN 37 GI/Hepatic: LFTs WNL, tbili 2.1 (sclera yellow per RN on 7/15), albumin 1.9, prealbumin low at 12.7, TG mildly elevated at 182 Intake / Output; MIVF:  UOP incompletely documented, NG 554mL, LBM x2 7/13 GI Imaging: 7/13 Abd xray NG tube placement 7/12 Cortrak advanced to small bowel 7/11 CT - wall thickening at [place] anastomosis, no evidence of outlet obstruction GI Surgeries / Procedures:  6/28 elective distal gastrectomy 7/15 EGD - normal esophagus, jejunal stenosis, antrectomy found  Central access: triple IJ placed 11/01/20, PICC placed 11/10/20 TPN start date: 11/10/20  Nutritional Goals (per RD rec on 7/14): kCal: 1800-2000, Protein: 100-120, Fluid: >/= 1.8 L/d  Current Nutrition:  TPN Off TF 7/13  Plan:  Continue TPN at goal rate  75 ml/hr to provide 108g AA, 252g CHO and 54g ILE for a total of 1829 kCal, meeting 100% of patient needs Electrolytes in TPN: Na 50mEq/L, increase K slightly to 64mEq/L, Ca 9mEq/L for now (?add back 7/18), Mg 85mEq/L, Phos 2mmol/L, max acetate Add standard MVI and 1/2 trace elements to TPN Continue resistant SSI Q4H and increase regular insulin in TPN to 40 units Standard TPN labs on Mon/Thurs Monitor CBGs, tbili/jaundice F/u with ability to restart TF  Joliana Claflin D. Mina Marble, PharmD, BCPS, [place] 11/12/2020, 8:59 AM

## 2020-11-12 NOTE — Progress Notes (Signed)
PT placed on bipap, and given nebulizer for increased WOB, and SOB. PT RR still elevated but tolerating well at this time.

## 2020-11-12 NOTE — Progress Notes (Signed)
Patient ID: [name], female   DOB: 01-21-1946, 75 y.o.   MRN: [id number] [facility] Progress Note:   1 Day Post-Op  Subjective: Mental status is responsive.  Complaints none. Objective: Vital signs in last 24 hours: Temp:  [97.2 F (36.2 C)-98.8 F (37.1 C)] 98 F (36.7 C) (07/16 0850) Pulse Rate:  [70-123] 97 (07/16 0850) Resp:  [17-38] 20 (07/16 0850) BP: (119-195)/(46-135) 162/70 (07/16 0850) SpO2:  [90 %-100 %] 93 % (07/16 0850) Weight:  [95.2 kg] 95.2 kg (07/16 0458)  Intake/Output from previous day: 07/15 0701 - 07/16 0700 In: 1428.1 [I.V.:1325.1; IV Piggyback:103] Out: 575 [Urine:575] Intake/Output this shift: No intake/output data recorded.  Physical Exam: Patient is in 4 NP.  Her husband is at the beside;  gastric decompression.    Lab Results:  Results for orders placed or performed during the hospital encounter of 09/30/2020 (from the past 48 hour(s))  Glucose, capillary     Status: Abnormal   Collection Time: 11/10/20 11:32 AM  Result Value Ref Range   Glucose-Capillary 150 (H) 70 - 99 mg/dL    Comment: Glucose reference range applies only to samples taken after fasting for at least 8 hours.  Glucose, capillary     Status: Abnormal   Collection Time: 11/10/20  4:12 PM  Result Value Ref Range   Glucose-Capillary 122 (H) 70 - 99 mg/dL    Comment: Glucose reference range applies only to samples taken after fasting for at least 8 hours.  Glucose, capillary     Status: Abnormal   Collection Time: 11/10/20  7:23 PM  Result Value Ref Range   Glucose-Capillary 191 (H) 70 - 99 mg/dL    Comment: Glucose reference range applies only to samples taken after fasting for at least 8 hours.  Glucose, capillary     Status: Abnormal   Collection Time: 11/10/20 11:34 PM  Result Value Ref Range   Glucose-Capillary 180 (H) 70 - 99 mg/dL    Comment: Glucose reference range applies only to samples taken after fasting for at least 8 hours.  Magnesium      Status: None   Collection Time: 11/23/2020 12:22 AM  Result Value Ref Range   Magnesium 2.2 1.7 - 2.4 mg/dL    Comment: Performed at [facility], [place] [address] [place], [place] [address]  Phosphorus     Status: None   Collection Time: 11/07/2020 12:22 AM  Result Value Ref Range   Phosphorus 3.0 2.5 - 4.6 mg/dL    Comment: Performed at [facility] [address] [place], [place] [address]  Comprehensive metabolic panel     Status: Abnormal   Collection Time: 10/31/2020 12:22 AM  Result Value Ref Range   Sodium 147 (H) 135 - 145 mmol/L   Potassium 4.4 3.5 - 5.1 mmol/L   Chloride 114 (H) 98 - 111 mmol/L   CO2 28 22 - 32 mmol/L   Glucose, Bld 205 (H) 70 - 99 mg/dL    Comment: Glucose reference range applies only to samples taken after fasting for at least 8 hours.   BUN 34 (H) 8 - 23 mg/dL   Creatinine, Ser 1.06 (H) 0.44 - 1.00 mg/dL   Calcium 8.7 (L) 8.9 - 10.3 mg/dL   Total Protein 6.3 (L) 6.5 - 8.1 g/dL   Albumin 2.1 (L) 3.5 - 5.0 g/dL   AST 31 15 - 41 U/L   ALT 25 0 - 44 U/L   Alkaline Phosphatase 75  38 - 126 U/L   Total Bilirubin 2.1 (H) 0.3 - 1.2 mg/dL   GFR, Estimated 54 (L) >60 mL/min    Comment: (NOTE) Calculated using the CKD-EPI Creatinine Equation (2021)    Anion gap 5 5 - 15    Comment: Performed at [facility] [address], [place], [place] [address]  Prealbumin     Status: Abnormal   Collection Time: 11/02/2020 12:22 AM  Result Value Ref Range   Prealbumin 12.7 (L) 18 - 38 mg/dL    Comment: Performed at [facility] [address], [place], [place] [address]  Triglycerides     Status: Abnormal   Collection Time: 10/29/2020 12:22 AM  Result Value Ref Range   Triglycerides 182 (H) <150 mg/dL    Comment: Performed at [facility] [address] [place], [place] [address]  CBC with Differential/Platelet     Status: Abnormal   Collection Time: 11/23/2020 12:22 AM  Result Value Ref Range   WBC 12.5 (H) 4.0 - 10.5 K/uL   RBC 2.93 (L)  3.87 - 5.11 MIL/uL   Hemoglobin 8.5 (L) 12.0 - 15.0 g/dL   HCT 29.3 (L) 36.0 - 46.0 %   MCV 100.0 80.0 - 100.0 fL   MCH 29.0 26.0 - 34.0 pg   MCHC 29.0 (L) 30.0 - 36.0 g/dL   RDW 17.0 (H) 11.5 - 15.5 %   Platelets 368 150 - 400 K/uL   nRBC 0.0 0.0 - 0.2 %   Neutrophils Relative % 85 %   Neutro Abs 10.6 (H) 1.7 - 7.7 K/uL   Lymphocytes Relative 7 %   Lymphs Abs 0.9 0.7 - 4.0 K/uL   Monocytes Relative 6 %   Monocytes Absolute 0.7 0.1 - 1.0 K/uL   Eosinophils Relative 0 %   Eosinophils Absolute 0.0 0.0 - 0.5 K/uL   Basophils Relative 0 %   Basophils Absolute 0.0 0.0 - 0.1 K/uL   Immature Granulocytes 2 %   Abs Immature Granulocytes 0.27 (H) 0.00 - 0.07 K/uL    Comment: Performed at [facility] [address] [place], [place] [address]  Glucose, capillary     Status: Abnormal   Collection Time: 11/07/2020  4:11 AM  Result Value Ref Range   Glucose-Capillary 192 (H) 70 - 99 mg/dL    Comment: Glucose reference range applies only to samples taken after fasting for at least 8 hours.  Glucose, capillary     Status: Abnormal   Collection Time: 11/06/2020  7:40 AM  Result Value Ref Range   Glucose-Capillary 198 (H) 70 - 99 mg/dL    Comment: Glucose reference range applies only to samples taken after fasting for at least 8 hours.  Glucose, capillary     Status: Abnormal   Collection Time: 11/05/2020 11:16 AM  Result Value Ref Range   Glucose-Capillary 243 (H) 70 - 99 mg/dL    Comment: Glucose reference range applies only to samples taken after fasting for at least 8 hours.  Glucose, capillary     Status: Abnormal   Collection Time: 11/13/2020  1:14 PM  Result Value Ref Range   Glucose-Capillary 191 (H) 70 - 99 mg/dL    Comment: Glucose reference range applies only to samples taken after fasting for at least 8 hours.  Glucose, capillary     Status: Abnormal   Collection Time: 11/15/2020  4:01 PM  Result Value Ref Range   Glucose-Capillary 161 (H) 70 - 99 mg/dL  Comment: Glucose  reference range applies only to samples taken after fasting for at least 8 hours.  Glucose, capillary     Status: Abnormal   Collection Time: 11/06/2020  8:27 PM  Result Value Ref Range   Glucose-Capillary 230 (H) 70 - 99 mg/dL    Comment: Glucose reference range applies only to samples taken after fasting for at least 8 hours.   Comment 1 Notify RN    Comment 2 Document in Chart   Glucose, capillary     Status: Abnormal   Collection Time: 10/30/2020 11:53 PM  Result Value Ref Range   Glucose-Capillary 205 (H) 70 - 99 mg/dL    Comment: Glucose reference range applies only to samples taken after fasting for at least 8 hours.   Comment 1 Notify RN    Comment 2 Document in Chart   Comprehensive metabolic panel     Status: Abnormal   Collection Time: 11/12/20  2:42 AM  Result Value Ref Range   Sodium 147 (H) 135 - 145 mmol/L   Potassium 3.8 3.5 - 5.1 mmol/L   Chloride 116 (H) 98 - 111 mmol/L   CO2 24 22 - 32 mmol/L   Glucose, Bld 190 (H) 70 - 99 mg/dL    Comment: Glucose reference range applies only to samples taken after fasting for at least 8 hours.   BUN 37 (H) 8 - 23 mg/dL   Creatinine, Ser 1.05 (H) 0.44 - 1.00 mg/dL   Calcium 8.3 (L) 8.9 - 10.3 mg/dL   Total Protein 5.9 (L) 6.5 - 8.1 g/dL   Albumin 1.9 (L) 3.5 - 5.0 g/dL   AST 22 15 - 41 U/L   ALT 23 0 - 44 U/L   Alkaline Phosphatase 63 38 - 126 U/L   Total Bilirubin 1.9 (H) 0.3 - 1.2 mg/dL   GFR, Estimated 55 (L) >60 mL/min    Comment: (NOTE) Calculated using the CKD-EPI Creatinine Equation (2021)    Anion gap 7 5 - 15    Comment: Performed at [facility], [place] [address], [place], [place] [address]  Magnesium     Status: None   Collection Time: 11/12/20  2:42 AM  Result Value Ref Range   Magnesium 2.2 1.7 - 2.4 mg/dL    Comment: Performed at [facility] [address], [place], [place] [address]  Phosphorus     Status: None   Collection Time: 11/12/20  2:42 AM  Result Value Ref Range   Phosphorus 3.0 2.5 -  4.6 mg/dL    Comment: Performed at [facility] [address] [place], [place] [address]  CBC with Differential/Platelet     Status: Abnormal   Collection Time: 11/12/20  2:42 AM  Result Value Ref Range   WBC 19.6 (H) 4.0 - 10.5 K/uL   RBC 3.02 (L) 3.87 - 5.11 MIL/uL   Hemoglobin 8.7 (L) 12.0 - 15.0 g/dL   HCT 30.0 (L) 36.0 - 46.0 %   MCV 99.3 80.0 - 100.0 fL   MCH 28.8 26.0 - 34.0 pg   MCHC 29.0 (L) 30.0 - 36.0 g/dL   RDW 17.2 (H) 11.5 - 15.5 %   Platelets 320 150 - 400 K/uL    Comment: REPEATED TO VERIFY   nRBC 0.0 0.0 - 0.2 %   Neutrophils Relative % 91 %   Neutro Abs 17.7 (H) 1.7 - 7.7 K/uL   Lymphocytes Relative 3 %   Lymphs Abs 0.7 0.7 - 4.0  K/uL   Monocytes Relative 4 %   Monocytes Absolute 0.8 0.1 - 1.0 K/uL   Eosinophils Relative 0 %   Eosinophils Absolute 0.0 0.0 - 0.5 K/uL   Basophils Relative 0 %   Basophils Absolute 0.0 0.0 - 0.1 K/uL   Immature Granulocytes 2 %   Abs Immature Granulocytes 0.34 (H) 0.00 - 0.07 K/uL    Comment: Performed at [facility] [address] [place], [place] [address]  Glucose, capillary     Status: Abnormal   Collection Time: 11/12/20  4:09 AM  Result Value Ref Range   Glucose-Capillary 181 (H) 70 - 99 mg/dL    Comment: Glucose reference range applies only to samples taken after fasting for at least 8 hours.   Comment 1 Notify RN    Comment 2 Document in Chart   Glucose, capillary     Status: Abnormal   Collection Time: 11/12/20  8:52 AM  Result Value Ref Range   Glucose-Capillary 226 (H) 70 - 99 mg/dL    Comment: Glucose reference range applies only to samples taken after fasting for at least 8 hours.   Comment 1 Notify RN    Comment 2 Document in Chart     Radiology/Results: DG Abd 1 View  Result Date: 11/10/2020 CLINICAL DATA:  Status post NG and feeding tube placement. EXAM: ABDOMEN - 1 VIEW COMPARISON:  Single-view of the abdomen 11/09/2020. FINDINGS: NG tube feeding tube are both within the stomach with  their tips directed distally. Side port of the NG tube is also in the stomach. IMPRESSION: As above. Electronically Signed   By: Inge Rise M.D.   On: 11/10/2020 11:43   DG Chest Port 1 View  Result Date: 11/05/2020 CLINICAL DATA:  Post EGD EXAM: PORTABLE CHEST 1 VIEW COMPARISON:  11/07/2020 FINDINGS: NG tube in the stomach. Feeding tube enters the stomach with the tip not visualized. Endotracheal tube removed since the prior study. Improvement in right lower lobe airspace disease. No change in left lower lobe airspace disease. Small left effusion is present. Cardiac enlargement without heart failure or edema. IMPRESSION: Improvement in right lower lobe airspace disease. No change in left lower lobe airspace disease and small left effusion Interval removal of endotracheal tube. Electronically Signed   By: Franchot Gallo M.D.   On: 11/10/2020 15:13    Anti-infectives: Anti-infectives (From admission, onward)    Start     Dose/Rate Route Frequency Ordered Stop   11/04/20 1245  cefTRIAXone (ROCEPHIN) 2 g in sodium chloride 0.9 % 100 mL IVPB        2 g 200 mL/hr over 30 Minutes Intravenous Every 24 hours 11/04/20 1148 11/06/20 1302   11/02/20 1000  cefTRIAXone (ROCEPHIN) 2 g in sodium chloride 0.9 % 100 mL IVPB  Status:  Discontinued        2 g 200 mL/hr over 30 Minutes Intravenous Every 24 hours 11/01/20 1257 11/02/20 0856   11/02/20 1000  doxycycline (VIBRAMYCIN) 100 mg in sodium chloride 0.9 % 250 mL IVPB  Status:  Discontinued        100 mg 125 mL/hr over 120 Minutes Intravenous Every 12 hours 11/01/20 1257 11/02/20 0856   11/02/20 1000  ceFEPIme (MAXIPIME) 2 g in sodium chloride 0.9 % 100 mL IVPB  Status:  Discontinued        2 g 200 mL/hr over 30 Minutes Intravenous Every 12 hours 11/02/20 0856 11/04/20 1148   11/01/20 0915  levofloxacin (LEVAQUIN) IVPB 750 mg  Status:  Discontinued        750 mg 100 mL/hr over 90 Minutes Intravenous Every 24 hours 11/01/20 0827 11/01/20 1257    11/01/20 0730  levofloxacin (LEVAQUIN) IVPB 500 mg  Status:  Discontinued        500 mg 100 mL/hr over 60 Minutes Intravenous Every 24 hours 11/01/20 0631 11/01/20 1257   10/27/2020 2100  ciprofloxacin (CIPRO) IVPB 400 mg        400 mg 200 mL/hr over 60 Minutes Intravenous Every 12 hours 10/22/2020 1258 10/06/2020 2148   10/06/2020 0600  ciprofloxacin (CIPRO) IVPB 400 mg        400 mg 200 mL/hr over 60 Minutes Intravenous On call to O.R. 10/18/2020 0547 10/18/2020 0847       Assessment/Plan: Problem List: Patient Active Problem List   Diagnosis Date Noted   Pressure injury of skin 11/04/2020   Cardiac arrest ([place])    Emesis    Respiratory failure ([facility])    SOB (shortness of breath)    Acute respiratory failure with hypoxemia ([place])    Encounter for central line placement    S/P partial gastrectomy 10/15/2020   Type 2 diabetes mellitus without complication ([place]) 30/86/5784   Hyperlipidemia 03/01/2014   Focal seizures ([place]) 10/21/2013   TIA (transient ischemic attack) 10/21/2013   CHEST PAIN 01/24/2010   BRADYCARDIA 12/23/2009   Diabetes ([place]) 08/04/2008   HYPOTHYROIDISM 03/06/2007   OBESITY 03/06/2007   OBSTRUCTIVE SLEEP APNEA 03/06/2007   HYPERTENSION, BENIGN 03/06/2007   CORONARY ARTERY DISEASE 03/06/2007   ALLERGIC RHINITIS 03/06/2007   DEGENERATIVE JOINT DISEASE, GENERALIZED 03/06/2007   BACK PAIN, CHRONIC 03/06/2007    Efferent limb obstruction per Dr. Benson Norway yesterday.  Continue TNA for now.   1 Day Post-Op    LOS: 18 days   Matt B. Hassell Done, MD, [facility] [phone] to reach the surgeon on call.    11/12/2020 9:47 AM

## 2020-11-12 NOTE — Progress Notes (Signed)
PROGRESS NOTE    [name]  FTD:[id number] DOB: 08/10/1945 DOA: 10/19/2020 PCP: Harlan Stains, MD   Brief Narrative:  The patient is a morbidly obese Caucasian female with a past medical history significant for but not limited to OSA, diabetes mellitus type 2, hypertension, CAD, history of allergic rhinitis and as well as other comorbidities who was admitted 10/17/2020 for elective robotic distal gastrectomy as surgical intervention for recently found EGD polyp of the gastric antrum with focal intramucosal adenocarcinoma.  She had the procedure done on 10/26/2019.  Pathology was negative malignancy but did show EGD with negative nodes.  Subsequently early a.m. she had wheezing and received a breathing treatment.  Shortly thereafter she was found with emesis all over the bed and sore.  She had gurgling sounds and altered Loss of consciousness.  She ended up having a loss of pulse with initial rhythm of asystole.  She received 1 mg epinephrine before PEA which she received a second dose of epinephrine.  She then had ROSC after 6 minutes and she was intubated during the code.  She was transferred to [facility] was asked to manage and assist with the vent.  On 7 5 she was initially started on Levaquin but then changed to ceftriaxone and then doxycycline and subsequently escalated to IV cefepime.  Given her code she underwent a EEG which showed generalized slowing of severe diffuse diffuse encephalopathy nonspecific etiology.  Transesophageal echocardiogram done which showed a LVEF of 65 to 70% and grade 2 diastolic dysfunction.  She subsequently extubated on 11/08/2020 and critical care signed off on 11/09/2020 however TRH was asked to pick up for medical management. BP was still elevated but will make further adjustments.   Patient underwent an EGD yesterday by Dr. Benson Norway and she was found to have evidence of an antrectomy in the gastric body that was characterized by edema as well as a severe  benign-appearing intrinsic stenosis that was not traversed the jejunum.  She is noted to have a significant amount of bilious fluid in the remnant gastric body and fundus of the stomach and the fluid was evacuated by NG tube and the feeding tube was noted to be coiled in the gastric lumen.  Both tips were not beyond the anastomosis and the gastrojejunostomy was edematous.  She was found to have an obstruction and in the efferent lumen and General surgery and GI both recommending continuing TPN.  Assessment & Plan:   Active Problems:   S/P partial gastrectomy   Cardiac arrest (HCC)   Emesis   Respiratory failure (HCC)   SOB (shortness of breath)   Acute respiratory failure with hypoxemia (HCC)   Encounter for central line placement   Pressure injury of skin  Septic shock from aspiration pneumonia and tingling vaso-occlusion from sedation and third spacing, improved and resolved -She is status post antibiotics and sepsis physiology has improved significantly -Her echocardiogram showed an EF of 65 to 70% but did show grade 2 diastolic dysfunction -WBC has gone from 10.1 -> 12.5 -> 11.5 -> 12.5 and is worsening and is now 19.6 -Continue with Acetaminophen for mild pain or fever -Fluid hydration now discontinued but Surgery will try some IVF while off of TF and not getting TPN -Has Cortrak but still had Nausea and Vomiting  Acute respiratory failure with hypoxia in the setting of aspiration pneumonia -Cultures have been negative and she is completed her dose of IV antibiotics with ceftriaxone -She is now extubated and respiratory status improves -SLP  still evaluating as she still has dysphagia -SpO2: 95 % O2 Flow Rate (L/min): 6 L/min FiO2 (%): 30 % -Continue with DuoNeb 3 mL every 4 hours as needed for wheezing or shortness of breath  Cardiac arrest secondary to likely aspiration -Achieved ROSC in 6 minutes -Echocardiogram done and showed an EF of 65 to 70% and grade 2 diastolic  dysfunction -Continue to Monitor  Postop gastrectomy  -She did core track in the duodenum and had been tolerating tube feeds until she started vomiting.  -Because of her vomiting his tube feedings have been discontinued and GIs been consulted for EGD this will be done this AM -KUB done and showed Cortrak was in the Stomach so General Surgery recommending NOT to give Tube Feedings -Per primary team and they are recommending continuing TPN and TNI  Dysphagia -In the setting of deconditioning -SLP working and she is now n.p.o. and will get TPN f through PICC line -Currently going to be switched over to TPN but 1 tube feedings are resumed they are recommending vital 1.5 Cal at 55 MLS per hour and Prosource tube feedings at 45 mL per 2 twice daily -Patient's PreAlbumin was 12.7 -Patient getting an EGD today to evaluate GJ Anastamosis and she had an obstruction in the Efferent Limb of the anastomosis.  General surgery and GI both recommending TPN for now  CKD stage IIIa -Patient's BUN/Creatinine Is Now improving and 37/1.08 -> 34/1.06 and is now 37/1.05 -We Will Continue Monitor and Trend Avoid Nephrotoxic Medications, Contrast Dyes, Hypotension Renally Dose Medications -Repeat CMP in A.M.  Hypothyroidism -Patient's TSH was 5.572 with a free T4 of 0.47 and a free T3 of 0.6 -Currently getting levothyroxine 50 mcg per tube daily  Intermittent Twitching -She had intermittent twitching after her cardiac arrest and no events were captured on long-term EEG -EEG did not show any interictal and patient did not have any definitive seizures so neurology reduced her Keppra to 250 mg twice daily and recommended the patient remain seizure-free Keppra can be discontinued in 3 months  Hypertension -Continue with Amlodipine 10 mg and as needed Hydralazine changed to 10 mg IV q6hprn SBP >180 or DBP >100 -Continue monitor blood pressures per protocol -Last blood pressure reading was significantly elevated at  195/65 yesterday but is improved today and last blood pressure reading was 135/75 -If still remains elevated and will add Labetalol   Hyponatremia -Patient's Na+ is now 147 again -Continue to Monitor and Trend and repeat CMP in the AM as she is on TPN  Nausea and Vomiting, improved with -TF stopping and GI consulted and evaluated her GJ anastomosis and she is found to have an efferent limb obstruction -New with antiemetics with ondansetron 4 mg p.o./IV every 6 as needed nausea as well as metoclopramide 10 mg every 8 hours scheduled  Morbid Obesity -Complicates overall prognosis and care -Estimated body mass index is 42.39 kg/m as calculated from the following:   Height as of this encounter: 4\' 11"  (1.499 m).   Weight as of this encounter: 95.2 kg. -Weight Loss and Dietary Counseling given  -Nutritionist consulted and recommending J-tube placement given the delayed gastric emptying and persistent nausea vomiting and recommending continue TPN to meet 100% of the needs until enteral nutrition can be restarted.  Once enteral access is appropriate they are recommending vital 1.5 at 55 MLS per hour with Prosource tube feedings 45 mL twice daily -PreAlbumin Level is 12.7  DVT prophylaxis: Enoxaparin 40 mg sq q24h Code Status: FULL  CODE  Family Communication: Discussed with Husband at bedside  Disposition Plan: Pending further work-up and clearance by general surgery.  Disposition per primary but likely can be CIR  Status is: Inpatient  Remains inpatient appropriate because:Ongoing diagnostic testing needed not appropriate for outpatient work up, Unsafe d/c plan, IV treatments appropriate due to intensity of illness or inability to take PO, and Inpatient level of care appropriate due to severity of illness  Dispo:  Patient From: Other  Planned Disposition: Inpatient Rehab  Medically stable for discharge: No    Consultants:  General Surgery PCCM Neurology  Gastroenterology    Procedures: ECHOCARDIOGRAM 11/02/20 IMPRESSIONS     1. Left ventricular ejection fraction, by estimation, is 65 to 70%. The  left ventricle has normal function. The left ventricle has no regional  wall motion abnormalities. Left ventricular diastolic parameters are  consistent with Grade II diastolic  dysfunction (pseudonormalization).   2. Right ventricular systolic function was not well visualized. The right  ventricular size is not well visualized. There is moderately elevated  pulmonary artery systolic pressure. The estimated right ventricular  systolic pressure is 85.6 mmHg.   3. The mitral valve is grossly normal. Trivial mitral valve  regurgitation. No evidence of mitral stenosis.   4. The aortic valve is tricuspid. There is mild calcification of the  aortic valve. Aortic valve regurgitation is trivial. Mild aortic valve  sclerosis is present, with no evidence of aortic valve stenosis.   Comparison(s): No significant change from prior study.   FINDINGS   Left Ventricle: Left ventricular ejection fraction, by estimation, is 65  to 70%. The left ventricle has normal function. The left ventricle has no  regional wall motion abnormalities. Definity contrast agent was given IV  to delineate the left ventricular   endocardial borders. The left ventricular internal cavity size was normal  in size. There is no left ventricular hypertrophy. Left ventricular  diastolic parameters are consistent with Grade II diastolic dysfunction  (pseudonormalization).   Right Ventricle: The right ventricular size is not well visualized. Right  vetricular wall thickness was not well visualized. Right ventricular  systolic function was not well visualized. There is moderately elevated  pulmonary artery systolic pressure. The   tricuspid regurgitant velocity is 3.54 m/s, and with an assumed right  atrial pressure of 8 mmHg, the estimated right ventricular systolic  pressure is 31.4 mmHg.   Left  Atrium: Left atrial size was normal in size.   Right Atrium: Right atrial size was not well visualized.   Pericardium: There is no evidence of pericardial effusion. Presence of  pericardial fat pad.   Mitral Valve: The mitral valve is grossly normal. Trivial mitral valve  regurgitation. No evidence of mitral valve stenosis.   Tricuspid Valve: The tricuspid valve is normal in structure. Tricuspid  valve regurgitation is trivial. No evidence of tricuspid stenosis.   Aortic Valve: The aortic valve is tricuspid. There is mild calcification  of the aortic valve. Aortic valve regurgitation is trivial. Mild aortic  valve sclerosis is present, with no evidence of aortic valve stenosis.   Pulmonic Valve: The pulmonic valve was not well visualized. Pulmonic valve  regurgitation is trivial. No evidence of pulmonic stenosis.   Aorta: The aortic root and ascending aorta are structurally normal, with  no evidence of dilitation.   Venous: IVC assessment for right atrial pressure unable to be performed  due to mechanical ventilation.   IAS/Shunts: The interatrial septum was not well visualized.  LEFT VENTRICLE  PLAX 2D  LVIDd:         4.40 cm     Diastology  LVIDs:         2.40 cm     LV e' medial:    6.65 cm/s  LV PW:         0.90 cm     LV E/e' medial:  13.9  LV IVS:        1.00 cm     LV e' lateral:   4.87 cm/s  LVOT diam:     1.70 cm     LV E/e' lateral: 19.0  LV SV:         37  LV SV Index:   19  LVOT Area:     2.27 cm     LV Volumes (MOD)  LV vol d, MOD A2C: 72.7 ml  LV vol d, MOD A4C: 81.3 ml  LV vol s, MOD A2C: 29.8 ml  LV vol s, MOD A4C: 15.8 ml  LV SV MOD A2C:     42.9 ml  LV SV MOD A4C:     81.3 ml  LV SV MOD BP:      55.4 ml   LEFT ATRIUM           Index  LA diam:      3.30 cm 1.70 cm/m  LA Vol (A4C): 54.5 ml 28.02 ml/m   AORTIC VALVE  LVOT Vmax:   86.70 cm/s  LVOT Vmean:  51.500 cm/s  LVOT VTI:    0.163 m     AORTA  Ao Root diam: 2.80 cm  Ao Asc diam:   3.10 cm   MITRAL VALVE               TRICUSPID VALVE  MV Area (PHT): 4.04 cm    TR Peak grad:   50.1 mmHg  MV Decel Time: 188 msec    TR Vmax:        354.00 cm/s  MV E velocity: 92.50 cm/s  MV A velocity: 93.90 cm/s  SHUNTS  MV E/A ratio:  0.99        Systemic VTI:  0.16 m                             Systemic Diam: 1.70 cm   EEG 11/08/20 ABNORMALITY - Continuous slow, generalized   IMPRESSION: This study is suggestive of moderate diffuse encephalopathy, nonspecific etiology. No seizures or epileptiform discharges were seen throughout the recording.   EGD Findings:      The esophagus was normal.      Evidence of an antrectomy was found in the gastric body. This was      characterized by edema.      A severe benign-appearing, intrinsic stenosis that was non-traversed was      found in the jejunum.      There was significant amount of bilious fluid in the remnant gastric      body and the fundus of the stomach. The fluid was evacuated and the NG      tube and feeding tube were noted to be coiled in the gastric lumen. Both      tips were not beyond the anastomosis. The gastrojejunostomy was      edematous, but it was patent and it was passed through with ease.      Preferentially the endoscope advanced into the afferent limb and  this      was confirmed with finding the ampulla. The endoscope was retracted and      careful examination for the efferent lumen was performed. A few      centimeters distal to the gastrojejunostomy some thick mucus was      identified. This was suctioned and the endoscope was advanced to this      area. Immediately an obstruction was encountered. Several small pinpoint      orifices were identified and air bubbles were noted to be coming out      (Image 8). The findings were relayed to Dr. Zenia Resides. Using a rat-toothed      forcep the NG tube was grasped in the gastric lumen and it was advanced      distally into the afferent limb. The procedure was then  concluded. Impression:               - Normal esophagus.                           - An antrectomy was found, characterized by edema.                           - Jejunal stenosis.                           - No specimens collected. Recommendation:           - Return patient to hospital ward for ongoing care.                           - NPO.                           - Continue present medications.                           - ? Surgical revision.  Including procedures, antibiotic start and stop dates in addition to other pertinent events  6/28 > admit, robotic distal gastrectomy, 7/5 > 6 minute code (asystole then PEA), severe hypoxemic respiratory failure and shock 7/5 levaquin x1 7/5 ceftriaxone > 7/6 7/5 doxycycline > 7/6 7/6 cefepime >  7/6 EEG > generalized slowing suggestive of severe diffuse encephalopathy non-specific in etiology 7/6 TTE > LVEF 65-70%, grade II diastolic dysfunction, RSVP elevated 58.1 mmHg, trivial MR 7/12 extubated    Antimicrobials:  Anti-infectives (From admission, onward)    Start     Dose/Rate Route Frequency Ordered Stop   11/04/20 1245  cefTRIAXone (ROCEPHIN) 2 g in sodium chloride 0.9 % 100 mL IVPB        2 g 200 mL/hr over 30 Minutes Intravenous Every 24 hours 11/04/20 1148 11/06/20 1302   11/02/20 1000  cefTRIAXone (ROCEPHIN) 2 g in sodium chloride 0.9 % 100 mL IVPB  Status:  Discontinued        2 g 200 mL/hr over 30 Minutes Intravenous Every 24 hours 11/01/20 1257 11/02/20 0856   11/02/20 1000  doxycycline (VIBRAMYCIN) 100 mg in sodium chloride 0.9 % 250 mL IVPB  Status:  Discontinued        100 mg 125 mL/hr over 120 Minutes Intravenous Every 12 hours 11/01/20 1257 11/02/20 0856   11/02/20 1000  ceFEPIme (MAXIPIME) 2 g  in sodium chloride 0.9 % 100 mL IVPB  Status:  Discontinued        2 g 200 mL/hr over 30 Minutes Intravenous Every 12 hours 11/02/20 0856 11/04/20 1148   11/01/20 0915  levofloxacin (LEVAQUIN) IVPB 750 mg  Status:  Discontinued         750 mg 100 mL/hr over 90 Minutes Intravenous Every 24 hours 11/01/20 0827 11/01/20 1257   11/01/20 0730  levofloxacin (LEVAQUIN) IVPB 500 mg  Status:  Discontinued        500 mg 100 mL/hr over 60 Minutes Intravenous Every 24 hours 11/01/20 0631 11/01/20 1257   10/27/2020 2100  ciprofloxacin (CIPRO) IVPB 400 mg        400 mg 200 mL/hr over 60 Minutes Intravenous Every 12 hours 10/09/2020 1258 10/21/2020 2148   10/15/2020 0600  ciprofloxacin (CIPRO) IVPB 400 mg        400 mg 200 mL/hr over 60 Minutes Intravenous On call to O.R. 10/09/2020 0547 10/17/2020 0847        Subjective: Seen and examined at bedside and was doing okay and had no complaints.  Blood pressure was elevated but not as much today.  GI and general surgery both recommended n.p.o. currently recommending TPN.  No other concerns or complaints at this time.  Objective: Vitals:   11/12/20 0458 11/12/20 0850 11/12/20 1123 11/12/20 1325  BP:  (!) 162/70 (!) 151/73 (!) 155/75  Pulse:  97 (!) 101 95  Resp:  20 (!) 28 (!) 31  Temp:  98 F (36.7 C) 99 F (37.2 C)   TempSrc:  Axillary Oral   SpO2:  93% 94% 95%  Weight: 95.2 kg     Height:        Intake/Output Summary (Last 24 hours) at 11/12/2020 1442 Last data filed at 11/12/2020 0600 Gross per 24 hour  Intake 967.49 ml  Output 575 ml  Net 392.49 ml    Filed Weights   11/10/20 0305 11/17/2020 0500 11/12/20 0458  Weight: 94.6 kg 94.5 kg 95.2 kg   Examination: Physical Exam:  Constitutional: WN/WD morbidly obese Caucasian female currently in NAD and appears calm and slightly uncomfortable Eyes: Lids and conjunctivae normal, sclerae anicteric  ENMT: External Ears, Nose appear normal.  She has an NG tube in her left nare and a small bore Cortrak tube in her Right Nare Neck: Appears normal, supple, no cervical masses, normal ROM, no appreciable thyromegaly; no appreciable JVD Respiratory: Diminished to auscultation bilaterally with coarse breath sounds, no wheezing, rales,  rhonchi or crackles. Normal respiratory effort and patient is not tachypenic. No accessory muscle use.  Unlabored breathing Cardiovascular: RRR, no murmurs / rubs / gallops. S1 and S2 auscultated. Mild to 1+ lower extremity Abdomen: Soft, non-tender, non-distended. Bowel sounds positive.  GU: Deferred. Musculoskeletal: No clubbing / cyanosis of digits/nails. No joint deformity upper and lower extremities.  Skin: No rashes, lesions, ulcers on limited skin evaluation. No induration; Warm and dry.  Neurologic: CN 2-12 grossly intact with no focal deficits. Romberg sign and cerebellar reflexes not assessed.  Psychiatric: Normal judgment and insight. Alert but is little sleepy and drowsy. Normal mood and appropriate affect.    Data Reviewed: I have personally reviewed following labs and imaging studies  CBC: Recent Labs  Lab 11/08/20 0217 11/09/20 0308 11/10/20 0256 11/08/2020 0022 11/12/20 0242  WBC 10.1 12.5* 11.5* 12.5* 19.6*  NEUTROABS  --   --   --  10.6* 17.7*  HGB 7.7* 8.1* 8.1* 8.5* 8.7*  HCT 26.0* 27.7* 28.0* 29.3* 30.0*  MCV 100.4* 99.6 99.3 100.0 99.3  PLT 232 318 343 368 867    Basic Metabolic Panel: Recent Labs  Lab 11/08/20 0217 11/08/20 1144 11/09/20 0308 11/10/20 0256 10/31/2020 0022 11/12/20 0242  NA 143  --  143 144 147* 147*  K 5.4* 4.5 4.5 4.0 4.4 3.8  CL 110  --  108 111 114* 116*  CO2 28  --  26 27 28 24   GLUCOSE 152*  --  209* 132* 205* 190*  BUN 28*  --  38* 37* 34* 37*  CREATININE 1.20*  --  1.15* 1.08* 1.06* 1.05*  CALCIUM 9.0  --  9.0 9.1 8.7* 8.3*  MG 2.3  --  2.4 2.1 2.2 2.2  PHOS 4.8*  --  3.7 3.3 3.0 3.0    GFR: Estimated Creatinine Clearance: 45.3 mL/min (A) (by C-G formula based on SCr of 1.05 mg/dL (H)). Liver Function Tests: Recent Labs  Lab 11/09/20 1435 11/12/2020 0022 11/12/20 0242  AST 38 31 22  ALT 28 25 23   ALKPHOS 86 75 63  BILITOT 2.0* 2.1* 1.9*  PROT 6.5 6.3* 5.9*  ALBUMIN 2.2* 2.1* 1.9*    No results for input(s):  LIPASE, AMYLASE in the last 168 hours. No results for input(s): AMMONIA in the last 168 hours.  Coagulation Profile: No results for input(s): INR, PROTIME in the last 168 hours. Cardiac Enzymes: No results for input(s): CKTOTAL, CKMB, CKMBINDEX, TROPONINI in the last 168 hours. BNP (last 3 results) No results for input(s): PROBNP in the last 8760 hours. HbA1C: No results for input(s): HGBA1C in the last 72 hours. CBG: Recent Labs  Lab 10/28/2020 2027 11/05/2020 2353 11/12/20 0409 11/12/20 0852 11/12/20 1202  GLUCAP 230* 205* 181* 226* 184*    Lipid Profile: Recent Labs    11/02/2020 0022  TRIG 182*    Thyroid Function Tests: No results for input(s): TSH, T4TOTAL, FREET4, T3FREE, THYROIDAB in the last 72 hours. Anemia Panel: No results for input(s): VITAMINB12, FOLATE, FERRITIN, TIBC, IRON, RETICCTPCT in the last 72 hours. Sepsis Labs: No results for input(s): PROCALCITON, LATICACIDVEN in the last 168 hours.  No results found for this or any previous visit (from the past 240 hour(s)).   RN Pressure Injury Documentation: Pressure Injury 11/03/20 Sacrum Mid Stage 2 -  Partial thickness loss of dermis presenting as a shallow open injury with a red, pink wound bed without slough. (Active)  11/03/20 1000  Location: Sacrum  Location Orientation: Mid  Staging: Stage 2 -  Partial thickness loss of dermis presenting as a shallow open injury with a red, pink wound bed without slough.  Wound Description (Comments):   Present on Admission:     Estimated body mass index is 42.39 kg/m as calculated from the following:   Height as of this encounter: 4\' 11"  (1.499 m).   Weight as of this encounter: 95.2 kg.  Malnutrition Type:  Nutrition Problem: Increased nutrient needs Etiology: post-op healing  Malnutrition Characteristics:  Signs/Symptoms: estimated needs  Nutrition Interventions:  Interventions: Tube feeding   Radiology Studies: DG Chest Port 1 View  Result Date:  10/29/2020 CLINICAL DATA:  Post EGD EXAM: PORTABLE CHEST 1 VIEW COMPARISON:  11/07/2020 FINDINGS: NG tube in the stomach. Feeding tube enters the stomach with the tip not visualized. Endotracheal tube removed since the prior study. Improvement in right lower lobe airspace disease. No change in left lower lobe airspace disease. Small left effusion is present. Cardiac enlargement without heart failure  or edema. IMPRESSION: Improvement in right lower lobe airspace disease. No change in left lower lobe airspace disease and small left effusion Interval removal of endotracheal tube. Electronically Signed   By: Franchot Gallo M.D.   On: 11/04/2020 15:13    Scheduled Meds:  albuterol  2.5 mg Nebulization Once   bisacodyl  10 mg Rectal Daily   Chlorhexidine Gluconate Cloth  6 each Topical Daily   enoxaparin (LOVENOX) injection  40 mg Subcutaneous Q24H   insulin aspart  0-20 Units Subcutaneous Q4H   levothyroxine  150 mcg Per Tube Q0600   mouth rinse  15 mL Mouth Rinse BID   metoCLOPramide (REGLAN) injection  10 mg Intravenous Q8H   pantoprazole (PROTONIX) IV  40 mg Intravenous Daily   sodium chloride flush  10-40 mL Intracatheter Q12H   Continuous Infusions:  sodium chloride 400 mL/hr at 11/02/2020 1419   sodium chloride 20 mL/hr at 11/20/2020 2200   levETIRAcetam 250 mg (11/12/20 0913)   TPN ADULT (ION) 75 mL/hr at 11/08/2020 1713   TPN ADULT (ION)      LOS: 18 days   Kerney Elbe, DO [facility] PAGER is on AMION  If 7PM-7AM, please contact night-coverage [email or web address]

## 2020-11-13 ENCOUNTER — Inpatient Hospital Stay: Payer: Self-pay

## 2020-11-13 ENCOUNTER — Other Ambulatory Visit: Payer: Self-pay

## 2020-11-13 DIAGNOSIS — Z903 Acquired absence of stomach [part of]: Secondary | ICD-10-CM | POA: Diagnosis not present

## 2020-11-13 DIAGNOSIS — R1111 Vomiting without nausea: Secondary | ICD-10-CM | POA: Diagnosis not present

## 2020-11-13 DIAGNOSIS — I469 Cardiac arrest, cause unspecified: Secondary | ICD-10-CM | POA: Diagnosis not present

## 2020-11-13 DIAGNOSIS — J9601 Acute respiratory failure with hypoxia: Secondary | ICD-10-CM | POA: Diagnosis not present

## 2020-11-13 LAB — CBC WITH DIFFERENTIAL/PLATELET
Abs Immature Granulocytes: 0.37 10*3/uL — ABNORMAL HIGH (ref 0.00–0.07)
Basophils Absolute: 0 10*3/uL (ref 0.0–0.1)
Basophils Relative: 0 %
Eosinophils Absolute: 0 10*3/uL (ref 0.0–0.5)
Eosinophils Relative: 0 %
HCT: 28.6 % — ABNORMAL LOW (ref 36.0–46.0)
Hemoglobin: 8.2 g/dL — ABNORMAL LOW (ref 12.0–15.0)
Immature Granulocytes: 2 %
Lymphocytes Relative: 5 %
Lymphs Abs: 0.9 10*3/uL (ref 0.7–4.0)
MCH: 29 pg (ref 26.0–34.0)
MCHC: 28.7 g/dL — ABNORMAL LOW (ref 30.0–36.0)
MCV: 101.1 fL — ABNORMAL HIGH (ref 80.0–100.0)
Monocytes Absolute: 1 10*3/uL (ref 0.1–1.0)
Monocytes Relative: 5 %
Neutro Abs: 17.3 10*3/uL — ABNORMAL HIGH (ref 1.7–7.7)
Neutrophils Relative %: 88 %
Platelets: 305 10*3/uL (ref 150–400)
RBC: 2.83 MIL/uL — ABNORMAL LOW (ref 3.87–5.11)
RDW: 16.9 % — ABNORMAL HIGH (ref 11.5–15.5)
WBC: 19.6 10*3/uL — ABNORMAL HIGH (ref 4.0–10.5)
nRBC: 0 % (ref 0.0–0.2)

## 2020-11-13 LAB — COMPREHENSIVE METABOLIC PANEL
ALT: 18 U/L (ref 0–44)
AST: 19 U/L (ref 15–41)
Albumin: 1.7 g/dL — ABNORMAL LOW (ref 3.5–5.0)
Alkaline Phosphatase: 67 U/L (ref 38–126)
Anion gap: 6 (ref 5–15)
BUN: 43 mg/dL — ABNORMAL HIGH (ref 8–23)
CO2: 25 mmol/L (ref 22–32)
Calcium: 8.1 mg/dL — ABNORMAL LOW (ref 8.9–10.3)
Chloride: 117 mmol/L — ABNORMAL HIGH (ref 98–111)
Creatinine, Ser: 1.02 mg/dL — ABNORMAL HIGH (ref 0.44–1.00)
GFR, Estimated: 57 mL/min — ABNORMAL LOW (ref >60)
Glucose, Bld: 169 mg/dL — ABNORMAL HIGH (ref 70–99)
Potassium: 3.2 mmol/L — ABNORMAL LOW (ref 3.5–5.1)
Sodium: 148 mmol/L — ABNORMAL HIGH (ref 135–145)
Total Bilirubin: 2.1 mg/dL — ABNORMAL HIGH (ref 0.3–1.2)
Total Protein: 5.7 g/dL — ABNORMAL LOW (ref 6.5–8.1)

## 2020-11-13 LAB — GLUCOSE, CAPILLARY
Glucose-Capillary: 182 mg/dL — ABNORMAL HIGH (ref 70–99)
Glucose-Capillary: 184 mg/dL — ABNORMAL HIGH (ref 70–99)
Glucose-Capillary: 190 mg/dL — ABNORMAL HIGH (ref 70–99)
Glucose-Capillary: 196 mg/dL — ABNORMAL HIGH (ref 70–99)
Glucose-Capillary: 196 mg/dL — ABNORMAL HIGH (ref 70–99)
Glucose-Capillary: 219 mg/dL — ABNORMAL HIGH (ref 70–99)

## 2020-11-13 LAB — PHOSPHORUS: Phosphorus: 3.3 mg/dL (ref 2.5–4.6)

## 2020-11-13 LAB — MAGNESIUM: Magnesium: 2.3 mg/dL (ref 1.7–2.4)

## 2020-11-13 MED ORDER — TRAVASOL 10 % IV SOLN
INTRAVENOUS | Status: AC
  Filled 2020-11-13: qty 1080

## 2020-11-13 MED ORDER — POTASSIUM CHLORIDE 10 MEQ/50ML IV SOLN
10.0000 meq | INTRAVENOUS | Status: AC
  Administered 2020-11-13 (×4): 10 meq via INTRAVENOUS
  Filled 2020-11-13 (×4): qty 50

## 2020-11-13 NOTE — Progress Notes (Signed)
Patient pulled off bipap, RR 50, HR 130-140, sats 88% room air.  Immediately responded and placed patient on bipap, ECG performed, showed sinus tach 130, with a prolonged QTc 559.  Patient alert and oriented to self and place at baseline.  Benadryl administered for anxiety per MD order.  Vital signs documented before and after incident.  Notified oncall physician of incident and orders received to administer labetalol prn order.

## 2020-11-13 NOTE — Progress Notes (Signed)
PHARMACY - TOTAL PARENTERAL NUTRITION CONSULT NOTE  Indication: Partial gastrectomy and TF intolerance   Patient Measurements: Height: 4\' 11"  (149.9 cm) Weight: 95.2 kg (209 lb 14.1 oz) IBW/kg (Calculated) : 43.2 TPN AdjBW (KG): 56.1 Body mass index is 42.39 kg/m. Usual Weight: 95 kg  Assessment:  75 yo W admitted for 6/28 elective distal gastrectomy for malignant polyp. Patient had a 6 minute code on 7/5 with severe respiratory failure and shock. Patient had an ileus and presumed functional gastric outlet obstruction post-op.  Patient tolerated TF 7/5-7/7 at 20 ml/hr, 7/7- 7/8 at 30 ml/hr, 7/9-7/10 at 40 ml/hr (goal 55 ml/hr). TF were held due to access issues then restarted 7/12 at 20-30 ml/hr. TF were stopped 7/13 for large volume bilious emesis that does not look like TF. Weight unchanged from usual but had anasarca earlier this admit, now resolved.   Glucose / Insulin: hx DM on glipizide PTA - CBGs elevated post TPN initiation. Received 33 units insulin in past 24 hrs + 40 units in TPN Electrolytes: Na/CL 148/116, K 3.2 (SPS x1 7/12), CoCa down to 9.94 (none in TPN), others WNL Renal: SCr 1.05, BUN 37 7GI/Hepatic: LFTs WNL, tbili 2.1 (sclera yellow per RN on 7/15), albumin 1.7, prealbumin low at 12.7, TG mildly elevated at 182 Intake / Output; MIVF:  UOP 0.3 ml/kg/hr, NG 439mL, LBM x2 on 7/16 Pertinent GI Imaging: 7/11 CT - wall thickening at [place] anastomosis, no evidence of outlet obstruction 7/12 Cortrak advanced to small bowel GI Surgeries / Procedures:  6/28 elective distal gastrectomy 7/15 EGD - normal esophagus, jejunal stenosis, antrectomy found  Central access: triple IJ placed 11/01/20, PICC placed 11/10/20 TPN start date: 11/10/20  Nutritional Goals (per RD rec on 7/14): kCal: 1800-2000, Protein: 100-120, Fluid: >/= 1.8 L/d  Current Nutrition:  TPN Off TF 7/13  Plan:  Continue TPN at goal rate 75 ml/hr to provide 108g AA, 252g CHO and 54g ILE for a total of 1829 kCal,  meeting 100% of patient needs Electrolytes in TPN: Na 43mEq/L, increase K to 81mEq/L, Ca 63mEq/L for now (?add back 7/18), reduce Mg slightly to 74mEq/L, Phos 62mmol/L, max acetate Add standard MVI and 1/2 trace elements to TPN Continue resistant SSI Q4H and increase regular insulin in TPN to 60 units KCL x 4 runs Standard TPN labs on Mon/Thurs Monitor CBGs, tbili/jaundice, may need to increase free water in TPN  Keishawna Carranza D. Mina Marble, PharmD, BCPS, [place] 11/13/2020, 8:38 AM

## 2020-11-13 NOTE — Progress Notes (Signed)
Patient ID: [name], female   DOB: Apr 16, 1946, 75 y.o.   MRN: [id number] [facility] Surgery Progress Note:   2 Days Post-Op  Subjective: Mental status is responsive to questions.  Complaints nothing specifically. Objective: Vital signs in last 24 hours: Temp:  [97.5 F (36.4 C)-99.8 F (37.7 C)] 99.5 F (37.5 C) (07/17 1100) Pulse Rate:  [79-152] 97 (07/17 1100) Resp:  [16-38] 24 (07/17 1100) BP: (104-191)/(56-154) 131/67 (07/17 1100) SpO2:  [90 %-98 %] 93 % (07/17 1100)  Intake/Output from previous day: 07/16 0701 - 07/17 0700 In: 1246.7 [I.V.:830.7; NG/GT:110; IV Piggyback:306] Out: 1100 [Urine:700; Emesis/NG output:400] Intake/Output this shift: Total I/O In: 1380.7 [I.V.:1177.7; IV Piggyback:203] Out: -   Physical Exam: Work of breathing is CPAP at night;  breathing OK;  NG-bilious  Lab Results:  Results for orders placed or performed during the hospital encounter of 10/16/2020 (from the past 48 hour(s))  Glucose, capillary     Status: Abnormal   Collection Time: 10/28/2020  1:14 PM  Result Value Ref Range   Glucose-Capillary 191 (H) 70 - 99 mg/dL    Comment: Glucose reference range applies only to samples taken after fasting for at least 8 hours.  Glucose, capillary     Status: Abnormal   Collection Time: 11/13/2020  4:01 PM  Result Value Ref Range   Glucose-Capillary 161 (H) 70 - 99 mg/dL    Comment: Glucose reference range applies only to samples taken after fasting for at least 8 hours.  Glucose, capillary     Status: Abnormal   Collection Time: 11/22/2020  8:27 PM  Result Value Ref Range   Glucose-Capillary 230 (H) 70 - 99 mg/dL    Comment: Glucose reference range applies only to samples taken after fasting for at least 8 hours.   Comment 1 Notify RN    Comment 2 Document in Chart   Glucose, capillary     Status: Abnormal   Collection Time: 11/24/2020 11:53 PM  Result Value Ref Range   Glucose-Capillary 205 (H) 70 - 99 mg/dL    Comment: Glucose  reference range applies only to samples taken after fasting for at least 8 hours.   Comment 1 Notify RN    Comment 2 Document in Chart   Comprehensive metabolic panel     Status: Abnormal   Collection Time: 11/12/20  2:42 AM  Result Value Ref Range   Sodium 147 (H) 135 - 145 mmol/L   Potassium 3.8 3.5 - 5.1 mmol/L   Chloride 116 (H) 98 - 111 mmol/L   CO2 24 22 - 32 mmol/L   Glucose, Bld 190 (H) 70 - 99 mg/dL    Comment: Glucose reference range applies only to samples taken after fasting for at least 8 hours.   BUN 37 (H) 8 - 23 mg/dL   Creatinine, Ser 1.05 (H) 0.44 - 1.00 mg/dL   Calcium 8.3 (L) 8.9 - 10.3 mg/dL   Total Protein 5.9 (L) 6.5 - 8.1 g/dL   Albumin 1.9 (L) 3.5 - 5.0 g/dL   AST 22 15 - 41 U/L   ALT 23 0 - 44 U/L   Alkaline Phosphatase 63 38 - 126 U/L   Total Bilirubin 1.9 (H) 0.3 - 1.2 mg/dL   GFR, Estimated 55 (L) >60 mL/min    Comment: (NOTE) Calculated using the CKD-EPI Creatinine Equation (2021)    Anion gap 7 5 - 15    Comment: Performed at [facility], [place] [address], [place], [place]  [address]  Magnesium     Status: None   Collection Time: 11/12/20  2:42 AM  Result Value Ref Range   Magnesium 2.2 1.7 - 2.4 mg/dL    Comment: Performed at [facility] [address] [place], [place] [address]  Phosphorus     Status: None   Collection Time: 11/12/20  2:42 AM  Result Value Ref Range   Phosphorus 3.0 2.5 - 4.6 mg/dL    Comment: Performed at [facility] [address] [place], [place] [address]  CBC with Differential/Platelet     Status: Abnormal   Collection Time: 11/12/20  2:42 AM  Result Value Ref Range   WBC 19.6 (H) 4.0 - 10.5 K/uL   RBC 3.02 (L) 3.87 - 5.11 MIL/uL   Hemoglobin 8.7 (L) 12.0 - 15.0 g/dL   HCT 30.0 (L) 36.0 - 46.0 %   MCV 99.3 80.0 - 100.0 fL   MCH 28.8 26.0 - 34.0 pg   MCHC 29.0 (L) 30.0 - 36.0 g/dL   RDW 17.2 (H) 11.5 - 15.5 %   Platelets 320 150 - 400 K/uL    Comment: REPEATED TO VERIFY   nRBC 0.0 0.0 - 0.2 %    Neutrophils Relative % 91 %   Neutro Abs 17.7 (H) 1.7 - 7.7 K/uL   Lymphocytes Relative 3 %   Lymphs Abs 0.7 0.7 - 4.0 K/uL   Monocytes Relative 4 %   Monocytes Absolute 0.8 0.1 - 1.0 K/uL   Eosinophils Relative 0 %   Eosinophils Absolute 0.0 0.0 - 0.5 K/uL   Basophils Relative 0 %   Basophils Absolute 0.0 0.0 - 0.1 K/uL   Immature Granulocytes 2 %   Abs Immature Granulocytes 0.34 (H) 0.00 - 0.07 K/uL    Comment: Performed at [facility] [address] [place], [place] [address]  Glucose, capillary     Status: Abnormal   Collection Time: 11/12/20  4:09 AM  Result Value Ref Range   Glucose-Capillary 181 (H) 70 - 99 mg/dL    Comment: Glucose reference range applies only to samples taken after fasting for at least 8 hours.   Comment 1 Notify RN    Comment 2 Document in Chart   Glucose, capillary     Status: Abnormal   Collection Time: 11/12/20  8:52 AM  Result Value Ref Range   Glucose-Capillary 226 (H) 70 - 99 mg/dL    Comment: Glucose reference range applies only to samples taken after fasting for at least 8 hours.   Comment 1 Notify RN    Comment 2 Document in Chart   Glucose, capillary     Status: Abnormal   Collection Time: 11/12/20 12:02 PM  Result Value Ref Range   Glucose-Capillary 184 (H) 70 - 99 mg/dL    Comment: Glucose reference range applies only to samples taken after fasting for at least 8 hours.   Comment 1 Notify RN    Comment 2 Document in Chart   Glucose, capillary     Status: Abnormal   Collection Time: 11/12/20  4:22 PM  Result Value Ref Range   Glucose-Capillary 199 (H) 70 - 99 mg/dL    Comment: Glucose reference range applies only to samples taken after fasting for at least 8 hours.   Comment 1 Notify RN    Comment 2 Document in Chart   Glucose, capillary     Status: Abnormal   Collection Time: 11/12/20  8:30 PM  Result Value  Ref Range   Glucose-Capillary 205 (H) 70 - 99 mg/dL    Comment: Glucose reference range applies only to samples  taken after fasting for at least 8 hours.  Glucose, capillary     Status: Abnormal   Collection Time: 11/12/20 11:55 PM  Result Value Ref Range   Glucose-Capillary 161 (H) 70 - 99 mg/dL    Comment: Glucose reference range applies only to samples taken after fasting for at least 8 hours.  Comprehensive metabolic panel     Status: Abnormal   Collection Time: 11/13/20 12:25 AM  Result Value Ref Range   Sodium 148 (H) 135 - 145 mmol/L   Potassium 3.2 (L) 3.5 - 5.1 mmol/L   Chloride 117 (H) 98 - 111 mmol/L   CO2 25 22 - 32 mmol/L   Glucose, Bld 169 (H) 70 - 99 mg/dL    Comment: Glucose reference range applies only to samples taken after fasting for at least 8 hours.   BUN 43 (H) 8 - 23 mg/dL   Creatinine, Ser 1.02 (H) 0.44 - 1.00 mg/dL   Calcium 8.1 (L) 8.9 - 10.3 mg/dL   Total Protein 5.7 (L) 6.5 - 8.1 g/dL   Albumin 1.7 (L) 3.5 - 5.0 g/dL   AST 19 15 - 41 U/L   ALT 18 0 - 44 U/L   Alkaline Phosphatase 67 38 - 126 U/L   Total Bilirubin 2.1 (H) 0.3 - 1.2 mg/dL   GFR, Estimated 57 (L) >60 mL/min    Comment: (NOTE) Calculated using the CKD-EPI Creatinine Equation (2021)    Anion gap 6 5 - 15    Comment: Performed at [facility], [place] [address] [place], [place] [address]  Magnesium     Status: None   Collection Time: 11/13/20 12:25 AM  Result Value Ref Range   Magnesium 2.3 1.7 - 2.4 mg/dL    Comment: Performed at [facility], [place] [address] [place], [place] [address]  Phosphorus     Status: None   Collection Time: 11/13/20 12:25 AM  Result Value Ref Range   Phosphorus 3.3 2.5 - 4.6 mg/dL    Comment: Performed at [facility] [address] [place], [place] [address]  CBC with Differential/Platelet     Status: Abnormal   Collection Time: 11/13/20 12:25 AM  Result Value Ref Range   WBC 19.6 (H) 4.0 - 10.5 K/uL   RBC 2.83 (L) 3.87 - 5.11 MIL/uL   Hemoglobin 8.2 (L) 12.0 - 15.0 g/dL   HCT 28.6 (L) 36.0 - 46.0 %   MCV 101.1 (H) 80.0 - 100.0 fL   MCH 29.0  26.0 - 34.0 pg   MCHC 28.7 (L) 30.0 - 36.0 g/dL   RDW 16.9 (H) 11.5 - 15.5 %   Platelets 305 150 - 400 K/uL   nRBC 0.0 0.0 - 0.2 %   Neutrophils Relative % 88 %   Neutro Abs 17.3 (H) 1.7 - 7.7 K/uL   Lymphocytes Relative 5 %   Lymphs Abs 0.9 0.7 - 4.0 K/uL   Monocytes Relative 5 %   Monocytes Absolute 1.0 0.1 - 1.0 K/uL   Eosinophils Relative 0 %   Eosinophils Absolute 0.0 0.0 - 0.5 K/uL   Basophils Relative 0 %   Basophils Absolute 0.0 0.0 - 0.1 K/uL   Immature Granulocytes 2 %   Abs Immature Granulocytes 0.37 (H) 0.00 - 0.07 K/uL    Comment: Performed at [facility]  [address] [place], [place] [address]  Glucose, capillary     Status: Abnormal   Collection Time: 11/13/20  4:13 AM  Result Value Ref Range   Glucose-Capillary 184 (H) 70 - 99 mg/dL    Comment: Glucose reference range applies only to samples taken after fasting for at least 8 hours.  Glucose, capillary     Status: Abnormal   Collection Time: 11/13/20  8:24 AM  Result Value Ref Range   Glucose-Capillary 219 (H) 70 - 99 mg/dL    Comment: Glucose reference range applies only to samples taken after fasting for at least 8 hours.  Glucose, capillary     Status: Abnormal   Collection Time: 11/13/20 11:44 AM  Result Value Ref Range   Glucose-Capillary 190 (H) 70 - 99 mg/dL    Comment: Glucose reference range applies only to samples taken after fasting for at least 8 hours.   Comment 1 Notify RN    Comment 2 Document in Chart     Radiology/Results: DG Chest Port 1 View  Result Date: 10/30/2020 CLINICAL DATA:  Post EGD EXAM: PORTABLE CHEST 1 VIEW COMPARISON:  11/07/2020 FINDINGS: NG tube in the stomach. Feeding tube enters the stomach with the tip not visualized. Endotracheal tube removed since the prior study. Improvement in right lower lobe airspace disease. No change in left lower lobe airspace disease. Small left effusion is present. Cardiac enlargement without heart failure or edema. IMPRESSION:  Improvement in right lower lobe airspace disease. No change in left lower lobe airspace disease and small left effusion Interval removal of endotracheal tube. Electronically Signed   By: Franchot Gallo M.D.   On: 11/13/2020 15:13    Anti-infectives: Anti-infectives (From admission, onward)    Start     Dose/Rate Route Frequency Ordered Stop   11/04/20 1245  cefTRIAXone (ROCEPHIN) 2 g in sodium chloride 0.9 % 100 mL IVPB        2 g 200 mL/hr over 30 Minutes Intravenous Every 24 hours 11/04/20 1148 11/06/20 1302   11/02/20 1000  cefTRIAXone (ROCEPHIN) 2 g in sodium chloride 0.9 % 100 mL IVPB  Status:  Discontinued        2 g 200 mL/hr over 30 Minutes Intravenous Every 24 hours 11/01/20 1257 11/02/20 0856   11/02/20 1000  doxycycline (VIBRAMYCIN) 100 mg in sodium chloride 0.9 % 250 mL IVPB  Status:  Discontinued        100 mg 125 mL/hr over 120 Minutes Intravenous Every 12 hours 11/01/20 1257 11/02/20 0856   11/02/20 1000  ceFEPIme (MAXIPIME) 2 g in sodium chloride 0.9 % 100 mL IVPB  Status:  Discontinued        2 g 200 mL/hr over 30 Minutes Intravenous Every 12 hours 11/02/20 0856 11/04/20 1148   11/01/20 0915  levofloxacin (LEVAQUIN) IVPB 750 mg  Status:  Discontinued        750 mg 100 mL/hr over 90 Minutes Intravenous Every 24 hours 11/01/20 0827 11/01/20 1257   11/01/20 0730  levofloxacin (LEVAQUIN) IVPB 500 mg  Status:  Discontinued        500 mg 100 mL/hr over 60 Minutes Intravenous Every 24 hours 11/01/20 0631 11/01/20 1257   10/05/2020 2100  ciprofloxacin (CIPRO) IVPB 400 mg        400 mg 200 mL/hr over 60 Minutes Intravenous Every 12 hours 10/03/2020 1258 10/21/2020 2148   10/20/2020 0600  ciprofloxacin (CIPRO) IVPB 400 mg        400 mg 200 mL/hr over  60 Minutes Intravenous On call to O.R. 09/30/2020 0547 10/26/2020 0847       Assessment/Plan: Problem List: Patient Active Problem List   Diagnosis Date Noted   Pressure injury of skin 11/04/2020   Cardiac arrest ([place])    Emesis     Respiratory failure ([facility])    SOB (shortness of breath)    Acute respiratory failure with hypoxemia ([place])    Encounter for central line placement    S/P partial gastrectomy 10/10/2020   Type 2 diabetes mellitus without complication ([place]) 02/33/4356   Hyperlipidemia 03/01/2014   Focal seizures ([place]) 10/21/2013   TIA (transient ischemic attack) 10/21/2013   CHEST PAIN 01/24/2010   BRADYCARDIA 12/23/2009   Diabetes ([place]) 08/04/2008   HYPOTHYROIDISM 03/06/2007   OBESITY 03/06/2007   OBSTRUCTIVE SLEEP APNEA 03/06/2007   HYPERTENSION, BENIGN 03/06/2007   CORONARY ARTERY DISEASE 03/06/2007   ALLERGIC RHINITIS 03/06/2007   DEGENERATIVE JOINT DISEASE, GENERALIZED 03/06/2007   BACK PAIN, CHRONIC 03/06/2007    For PICC line change.  Efferent limb obstruction.   2 Days Post-Op    LOS: 19 days   Matt B. Hassell Done, MD, [facility] [phone] to reach the surgeon on call.    11/13/2020 12:00 PM

## 2020-11-13 NOTE — Progress Notes (Signed)
PROGRESS NOTE    [name]  LPF:[id number] DOB: 09-08-1945 DOA: 10/08/2020 PCP: Harlan Stains, MD   Brief Narrative:  The patient is a morbidly obese Caucasian female with a past medical history significant for but not limited to OSA, diabetes mellitus type 2, hypertension, CAD, history of allergic rhinitis and as well as other comorbidities who was admitted 10/19/2020 for elective robotic distal gastrectomy as surgical intervention for recently found EGD polyp of the gastric antrum with focal intramucosal adenocarcinoma.  She had the procedure done on 10/26/2019.  Pathology was negative malignancy but did show EGD with negative nodes.  Subsequently early a.m. she had wheezing and received a breathing treatment.  Shortly thereafter she was found with emesis all over the bed and sore.  She had gurgling sounds and altered Loss of consciousness.  She ended up having a loss of pulse with initial rhythm of asystole.  She received 1 mg epinephrine before PEA which she received a second dose of epinephrine.  She then had ROSC after 6 minutes and she was intubated during the code.  She was transferred to [facility] was asked to manage and assist with the vent.  On 7 5 she was initially started on Levaquin but then changed to ceftriaxone and then doxycycline and subsequently escalated to IV cefepime.  Given her code she underwent a EEG which showed generalized slowing of severe diffuse diffuse encephalopathy nonspecific etiology.  Transesophageal echocardiogram done which showed a LVEF of 65 to 70% and grade 2 diastolic dysfunction.  She subsequently extubated on 11/08/2020 and critical care signed off on 11/09/2020 however TRH was asked to pick up for medical management. BP was still elevated but will make further adjustments.   Patient underwent an EGD yesterday by Dr. Benson Norway and she was found to have evidence of an antrectomy in the gastric body that was characterized by edema as well as a severe  benign-appearing intrinsic stenosis that was not traversed the jejunum.  She is noted to have a significant amount of bilious fluid in the remnant gastric body and fundus of the stomach and the fluid was evacuated by NG tube and the feeding tube was noted to be coiled in the gastric lumen.  Both tips were not beyond the anastomosis and the gastrojejunostomy was edematous.  She was found to have an obstruction and in the efferent lumen and General surgery and GI both recommending continuing TPN.  Because of the patient was unable to return blood to report chest x-ray was done and PICC line could be advanced for optimal placement and a PICC line exchange order was placed per Dr. Hassell Done  Assessment & Plan:   Active Problems:   S/P partial gastrectomy   Cardiac arrest (HCC)   Emesis   Respiratory failure (HCC)   SOB (shortness of breath)   Acute respiratory failure with hypoxemia (HCC)   Encounter for central line placement   Pressure injury of skin  Septic Shock from aspiration pneumonia and tingling vaso-occlusion from sedation and third spacing, improved and resolved -She is status post antibiotics and sepsis physiology has improved significantly -Her echocardiogram showed an EF of 65 to 70% but did show grade 2 diastolic dysfunction -WBC has gone from 10.1 -> 12.5 -> 11.5 -> 12.5 and is worsening and is now 19.6 x2 -Continue with Acetaminophen for mild pain or fever -Fluid hydration now discontinued but Surgery will try some IVF while off of TF and not getting TPN -Has Cortrak but still had Nausea and  Vomiting  Acute respiratory failure with hypoxia in the setting of aspiration pneumonia -Cultures have been negative and she is completed her dose of IV antibiotics with ceftriaxone -She is now extubated and respiratory status improves -SLP still evaluating as she still has dysphagia -SpO2: 93 % O2 Flow Rate (L/min): 2 L/min FiO2 (%): 30 % -Continue with DuoNeb 3 mL every 4 hours as needed  for wheezing or shortness of breath  Cardiac arrest secondary to likely aspiration -Achieved ROSC in 6 minutes -Echocardiogram done and showed an EF of 65 to 70% and grade 2 diastolic dysfunction -Continue to Monitor  Postop Gastrectomy  -She did core track in the duodenum and had been tolerating tube feeds until she started vomiting.  -Because of her vomiting his tube feedings have been discontinued and GIs been consulted for EGD this will be done this AM -KUB done and showed Cortrak was in the Stomach so General Surgery recommending NOT to give Tube Feedings -Per primary team and they are recommending continuing TPN and TNA; PICC Line to be exchanged.   Dysphagia -In the setting of deconditioning -SLP working and she is now n.p.o. and will get TPN f through PICC line -Currently going to be switched over to TPN but 1 tube feedings are resumed they are recommending vital 1.5 Cal at 55 MLS per hour and Prosource tube feedings at 45 mL per 2 twice daily -Patient's PreAlbumin was 12.7 -Patient getting an EGD today to evaluate GJ Anastamosis and she had an obstruction in the Efferent Limb of the anastomosis.  General surgery and GI both recommending TPN for now  CKD stage IIIa -Patient's BUN/Creatinine Is Now improving and 37/1.08 -> 34/1.06 -> 37/1.05 -> 43/1.02 -We Will Continue Monitor and Trend Avoid Nephrotoxic Medications, Contrast Dyes, Hypotension Renally Dose Medications -Repeat CMP in A.M.  Hypothyroidism -Patient's TSH was 5.572 with a free T4 of 0.47 and a free T3 of 0.6 -Currently getting levothyroxine 50 mcg per tube daily  Intermittent Twitching -She had intermittent twitching after her cardiac arrest and no events were captured on long-term EEG -EEG did not show any interictal and patient did not have any definitive seizures so neurology reduced her Keppra to 250 mg twice daily and recommended the patient remain seizure-free Keppra can be discontinued in 3  months  Hypokalemia -Patient's K+ is now 3.2 and was 3.8 yesterday  -Replete with IV Kcl 40 mEQ  -Mag Level was 2.3 -Continue to Monitor and Replete as Necessary  -Repeat CMP in the AM   Hyperbilirubinemia -Patient's T Bili went from 2.1 -> 1.9 -> 2.1 -Continue to Monitor and Trend -Repeat CMP in the AM   Hypertension -Continue with Amlodipine 10 mg and as needed Hydralazine changed to 10 mg IV q6hprn SBP >180 or DBP >100 -Continue monitor blood pressures per protocol -Last blood pressure reading improved to 131/67 -If still remains elevated and will add Labetalol   Hyponatremia -Patient's Na+ is now 148 again -Continue to Monitor and Trend and repeat CMP in the AM as she is on TPN  Nausea and Vomiting, improved  -TF stopping and GI consulted and evaluated her GJ anastomosis and she is found to have an efferent limb obstruction -New with antiemetics with ondansetron 4 mg p.o./IV every 6 as needed nausea as well as metoclopramide 10 mg every 8 hours scheduled  Morbid Obesity -Complicates overall prognosis and care -Estimated body mass index is 42.39 kg/m as calculated from the following:   Height as of this encounter: 4'  11" (1.499 m).   Weight as of this encounter: 95.2 kg. -Weight Loss and Dietary Counseling given  -Nutritionist consulted and recommending J-tube placement given the delayed gastric emptying and persistent nausea vomiting and recommending continue TPN to meet 100% of the needs until enteral nutrition can be restarted.  Once enteral access is appropriate they are recommending vital 1.5 at 55 MLS per hour with Prosource tube feedings 45 mL twice daily -PreAlbumin Level is 12.7  DVT prophylaxis: Enoxaparin 40 mg sq q24h Code Status: FULL CODE  Family Communication: Discussed with Husband at bedside  Disposition Plan: Pending further work-up and clearance by general surgery.  Disposition per primary but likely can be CIR  Status is: Inpatient  Remains  inpatient appropriate because:Ongoing diagnostic testing needed not appropriate for outpatient work up, Unsafe d/c plan, IV treatments appropriate due to intensity of illness or inability to take PO, and Inpatient level of care appropriate due to severity of illness  Dispo:  Patient From: Other  Planned Disposition: Inpatient Rehab  Medically stable for discharge: No    Consultants:  General Surgery PCCM Neurology  Gastroenterology   Procedures: ECHOCARDIOGRAM 11/02/20 IMPRESSIONS     1. Left ventricular ejection fraction, by estimation, is 65 to 70%. The  left ventricle has normal function. The left ventricle has no regional  wall motion abnormalities. Left ventricular diastolic parameters are  consistent with Grade II diastolic  dysfunction (pseudonormalization).   2. Right ventricular systolic function was not well visualized. The right  ventricular size is not well visualized. There is moderately elevated  pulmonary artery systolic pressure. The estimated right ventricular  systolic pressure is 53.9 mmHg.   3. The mitral valve is grossly normal. Trivial mitral valve  regurgitation. No evidence of mitral stenosis.   4. The aortic valve is tricuspid. There is mild calcification of the  aortic valve. Aortic valve regurgitation is trivial. Mild aortic valve  sclerosis is present, with no evidence of aortic valve stenosis.   Comparison(s): No significant change from prior study.   FINDINGS   Left Ventricle: Left ventricular ejection fraction, by estimation, is 65  to 70%. The left ventricle has normal function. The left ventricle has no  regional wall motion abnormalities. Definity contrast agent was given IV  to delineate the left ventricular   endocardial borders. The left ventricular internal cavity size was normal  in size. There is no left ventricular hypertrophy. Left ventricular  diastolic parameters are consistent with Grade II diastolic dysfunction   (pseudonormalization).   Right Ventricle: The right ventricular size is not well visualized. Right  vetricular wall thickness was not well visualized. Right ventricular  systolic function was not well visualized. There is moderately elevated  pulmonary artery systolic pressure. The   tricuspid regurgitant velocity is 3.54 m/s, and with an assumed right  atrial pressure of 8 mmHg, the estimated right ventricular systolic  pressure is 76.7 mmHg.   Left Atrium: Left atrial size was normal in size.   Right Atrium: Right atrial size was not well visualized.   Pericardium: There is no evidence of pericardial effusion. Presence of  pericardial fat pad.   Mitral Valve: The mitral valve is grossly normal. Trivial mitral valve  regurgitation. No evidence of mitral valve stenosis.   Tricuspid Valve: The tricuspid valve is normal in structure. Tricuspid  valve regurgitation is trivial. No evidence of tricuspid stenosis.   Aortic Valve: The aortic valve is tricuspid. There is mild calcification  of the aortic valve. Aortic valve regurgitation  is trivial. Mild aortic  valve sclerosis is present, with no evidence of aortic valve stenosis.   Pulmonic Valve: The pulmonic valve was not well visualized. Pulmonic valve  regurgitation is trivial. No evidence of pulmonic stenosis.   Aorta: The aortic root and ascending aorta are structurally normal, with  no evidence of dilitation.   Venous: IVC assessment for right atrial pressure unable to be performed  due to mechanical ventilation.   IAS/Shunts: The interatrial septum was not well visualized.      LEFT VENTRICLE  PLAX 2D  LVIDd:         4.40 cm     Diastology  LVIDs:         2.40 cm     LV e' medial:    6.65 cm/s  LV PW:         0.90 cm     LV E/e' medial:  13.9  LV IVS:        1.00 cm     LV e' lateral:   4.87 cm/s  LVOT diam:     1.70 cm     LV E/e' lateral: 19.0  LV SV:         37  LV SV Index:   19  LVOT Area:     2.27 cm      LV Volumes (MOD)  LV vol d, MOD A2C: 72.7 ml  LV vol d, MOD A4C: 81.3 ml  LV vol s, MOD A2C: 29.8 ml  LV vol s, MOD A4C: 15.8 ml  LV SV MOD A2C:     42.9 ml  LV SV MOD A4C:     81.3 ml  LV SV MOD BP:      55.4 ml   LEFT ATRIUM           Index  LA diam:      3.30 cm 1.70 cm/m  LA Vol (A4C): 54.5 ml 28.02 ml/m   AORTIC VALVE  LVOT Vmax:   86.70 cm/s  LVOT Vmean:  51.500 cm/s  LVOT VTI:    0.163 m     AORTA  Ao Root diam: 2.80 cm  Ao Asc diam:  3.10 cm   MITRAL VALVE               TRICUSPID VALVE  MV Area (PHT): 4.04 cm    TR Peak grad:   50.1 mmHg  MV Decel Time: 188 msec    TR Vmax:        354.00 cm/s  MV E velocity: 92.50 cm/s  MV A velocity: 93.90 cm/s  SHUNTS  MV E/A ratio:  0.99        Systemic VTI:  0.16 m                             Systemic Diam: 1.70 cm   EEG 11/08/20 ABNORMALITY - Continuous slow, generalized   IMPRESSION: This study is suggestive of moderate diffuse encephalopathy, nonspecific etiology. No seizures or epileptiform discharges were seen throughout the recording.   EGD Findings:      The esophagus was normal.      Evidence of an antrectomy was found in the gastric body. This was      characterized by edema.      A severe benign-appearing, intrinsic stenosis that was non-traversed was      found in the jejunum.      There was significant amount of  bilious fluid in the remnant gastric      body and the fundus of the stomach. The fluid was evacuated and the NG      tube and feeding tube were noted to be coiled in the gastric lumen. Both      tips were not beyond the anastomosis. The gastrojejunostomy was      edematous, but it was patent and it was passed through with ease.      Preferentially the endoscope advanced into the afferent limb and this      was confirmed with finding the ampulla. The endoscope was retracted and      careful examination for the efferent lumen was performed. A few      centimeters distal to the gastrojejunostomy  some thick mucus was      identified. This was suctioned and the endoscope was advanced to this      area. Immediately an obstruction was encountered. Several small pinpoint      orifices were identified and air bubbles were noted to be coming out      (Image 8). The findings were relayed to Dr. Zenia Resides. Using a rat-toothed      forcep the NG tube was grasped in the gastric lumen and it was advanced      distally into the afferent limb. The procedure was then concluded. Impression:               - Normal esophagus.                           - An antrectomy was found, characterized by edema.                           - Jejunal stenosis.                           - No specimens collected. Recommendation:           - Return patient to hospital ward for ongoing care.                           - NPO.                           - Continue present medications.                           - ? Surgical revision.  Including procedures, antibiotic start and stop dates in addition to other pertinent events  6/28 > admit, robotic distal gastrectomy, 7/5 > 6 minute code (asystole then PEA), severe hypoxemic respiratory failure and shock 7/5 levaquin x1 7/5 ceftriaxone > 7/6 7/5 doxycycline > 7/6 7/6 cefepime >  7/6 EEG > generalized slowing suggestive of severe diffuse encephalopathy non-specific in etiology 7/6 TTE > LVEF 65-70%, grade II diastolic dysfunction, RSVP elevated 58.1 mmHg, trivial MR 7/12 extubated    Antimicrobials:  Anti-infectives (From admission, onward)    Start     Dose/Rate Route Frequency Ordered Stop   11/04/20 1245  cefTRIAXone (ROCEPHIN) 2 g in sodium chloride 0.9 % 100 mL IVPB        2 g 200 mL/hr over 30 Minutes Intravenous Every 24 hours 11/04/20 1148 11/06/20 1302   11/02/20 1000  cefTRIAXone (  ROCEPHIN) 2 g in sodium chloride 0.9 % 100 mL IVPB  Status:  Discontinued        2 g 200 mL/hr over 30 Minutes Intravenous Every 24 hours 11/01/20 1257 11/02/20 0856   11/02/20  1000  doxycycline (VIBRAMYCIN) 100 mg in sodium chloride 0.9 % 250 mL IVPB  Status:  Discontinued        100 mg 125 mL/hr over 120 Minutes Intravenous Every 12 hours 11/01/20 1257 11/02/20 0856   11/02/20 1000  ceFEPIme (MAXIPIME) 2 g in sodium chloride 0.9 % 100 mL IVPB  Status:  Discontinued        2 g 200 mL/hr over 30 Minutes Intravenous Every 12 hours 11/02/20 0856 11/04/20 1148   11/01/20 0915  levofloxacin (LEVAQUIN) IVPB 750 mg  Status:  Discontinued        750 mg 100 mL/hr over 90 Minutes Intravenous Every 24 hours 11/01/20 0827 11/01/20 1257   11/01/20 0730  levofloxacin (LEVAQUIN) IVPB 500 mg  Status:  Discontinued        500 mg 100 mL/hr over 60 Minutes Intravenous Every 24 hours 11/01/20 0631 11/01/20 1257   10/06/2020 2100  ciprofloxacin (CIPRO) IVPB 400 mg        400 mg 200 mL/hr over 60 Minutes Intravenous Every 12 hours 10/20/2020 1258 10/01/2020 2148   10/10/2020 0600  ciprofloxacin (CIPRO) IVPB 400 mg        400 mg 200 mL/hr over 60 Minutes Intravenous On call to O.R. 10/12/2020 0547 09/30/2020 0847        Subjective: Seen and examined at bedside and is doing okay.  Resting in bed.  Blood pressure is getting better.  General surgery recommending a PICC line exchange given that her PICC line had difficulty drawing back.  No other concerns at this time and she is going to get continued TPN once her PICC line is changed out.  Objective: Vitals:   11/13/20 0700 11/13/20 0800 11/13/20 0821 11/13/20 1100  BP: (!) 143/88 (!) 118/100 125/79 131/67  Pulse: (!) 104 80 81 97  Resp: (!) _0 (!) 24  Temp: 97.9 F (36.6 C)  99 F (37.2 C) 99.5 F (37.5 C)  TempSrc: Oral  Oral Oral  SpO2: 93% 93% 96% 93%  Weight:      Height:        Intake/Output Summary (Last 24 hours) at 11/13/2020 1437 Last data filed at 11/13/2020 1149 Gross per 24 hour  Intake 2627.42 ml  Output 1100 ml  Net 1527.42 ml    Filed Weights   11/10/20 0305 11/17/2020 0500 11/12/20 0458  Weight: 94.6 kg  94.5 kg 95.2 kg   Examination: Physical Exam:  Constitutional: WN/WD morbidly obese Caucasian female currently no acute distress appears calm but mildly uncomfortable Eyes: Lids and conjunctivae normal, sclerae anicteric  ENMT: External Ears, Nose appear normal. Grossly normal hearing.  Has an NG tube in her left nare and a small bore Cortrak tube in the right nare Neck: Appears normal, supple, no cervical masses, normal ROM, no appreciable thyromegaly; no JVD Respiratory: Diminished to auscultation bilaterally, no wheezing, rales, rhonchi or crackles. Normal respiratory effort and patient is not tachypenic. No accessory muscle use.  Unlabored breathing Cardiovascular: RRR, no murmurs / rubs / gallops. S1 and S2 auscultated.  1+ lower extremity edema Abdomen: Soft, non-tender, distended secondary body habitus. Bowel sounds positive.  GU: Deferred. Musculoskeletal: No clubbing / cyanosis of digits/nails. No joint deformity upper and lower extremities.  Skin:  No rashes, lesions, ulcers on limited skin evaluation.   Neurologic: CN 2-12 grossly intact with no focal deficits. Romberg sign and cerebellar reflexes not assessed.  Psychiatric: Normal judgment and insight. Alert and oriented x 3. Normal mood and appropriate affect.   Data Reviewed: I have personally reviewed following labs and imaging studies  CBC: Recent Labs  Lab 11/09/20 0308 11/10/20 0256 11/02/2020 0022 11/12/20 0242 11/13/20 0025  WBC 12.5* 11.5* 12.5* 19.6* 19.6*  NEUTROABS  --   --  10.6* 17.7* 17.3*  HGB 8.1* 8.1* 8.5* 8.7* 8.2*  HCT 27.7* 28.0* 29.3* 30.0* 28.6*  MCV 99.6 99.3 100.0 99.3 101.1*  PLT 318 343 368 320 099    Basic Metabolic Panel: Recent Labs  Lab 11/09/20 0308 11/10/20 0256 11/08/2020 0022 11/12/20 0242 11/13/20 0025  NA 143 144 147* 147* 148*  K 4.5 4.0 4.4 3.8 3.2*  CL 108 111 114* 116* 117*  CO2 _0 GLUCOSE 209* 132* 205* 190* 169*  BUN 38* 37* 34* 37* 43*  CREATININE 1.15*  1.08* 1.06* 1.05* 1.02*  CALCIUM 9.0 9.1 8.7* 8.3* 8.1*  MG 2.4 2.1 2.2 2.2 2.3  PHOS 3.7 3.3 3.0 3.0 3.3    GFR: Estimated Creatinine Clearance: 46.7 mL/min (A) (by C-G formula based on SCr of 1.02 mg/dL (H)). Liver Function Tests: Recent Labs  Lab 11/09/20 1435 11/04/2020 0022 11/12/20 0242 11/13/20 0025  AST 38 _1 ALT _2 ALKPHOS 86 75 63 67  BILITOT 2.0* 2.1* 1.9* 2.1*  PROT 6.5 6.3* 5.9* 5.7*  ALBUMIN 2.2* 2.1* 1.9* 1.7*    No results for input(s): LIPASE, AMYLASE in the last 168 hours. No results for input(s): AMMONIA in the last 168 hours.  Coagulation Profile: No results for input(s): INR, PROTIME in the last 168 hours. Cardiac Enzymes: No results for input(s): CKTOTAL, CKMB, CKMBINDEX, TROPONINI in the last 168 hours. BNP (last 3 results) No results for input(s): PROBNP in the last 8760 hours. HbA1C: No results for input(s): HGBA1C in the last 72 hours. CBG: Recent Labs  Lab 11/12/20 2030 11/12/20 2355 11/13/20 0413 11/13/20 0824 11/13/20 1144  GLUCAP 205* 161* 184* 219* 190*    Lipid Profile: Recent Labs    11/18/2020 0022  TRIG 182*    Thyroid Function Tests: No results for input(s): TSH, T4TOTAL, FREET4, T3FREE, THYROIDAB in the last 72 hours. Anemia Panel: No results for input(s): VITAMINB12, FOLATE, FERRITIN, TIBC, IRON, RETICCTPCT in the last 72 hours. Sepsis Labs: No results for input(s): PROCALCITON, LATICACIDVEN in the last 168 hours.  No results found for this or any previous visit (from the past 240 hour(s)).   RN Pressure Injury Documentation: Pressure Injury 11/03/20 Sacrum Mid Stage 2 -  Partial thickness loss of dermis presenting as a shallow open injury with a red, pink wound bed without slough. (Active)  11/03/20 1000  Location: Sacrum  Location Orientation: Mid  Staging: Stage 2 -  Partial thickness loss of dermis presenting as a shallow open injury with a red, pink wound bed without slough.  Wound Description  (Comments):   Present on Admission:     Estimated body mass index is 42.39 kg/m as calculated from the following:   Height as of this encounter: _3  (1.499 m).   Weight as of this encounter: 95.2 kg.  Malnutrition Type:  Nutrition Problem: Increased nutrient needs Etiology: post-op healing  Malnutrition Characteristics:  Signs/Symptoms: estimated needs  Nutrition Interventions:  Interventions: Tube  feeding   Radiology Studies: DG Chest Port 1 View  Result Date: 11/07/2020 CLINICAL DATA:  Post EGD EXAM: PORTABLE CHEST 1 VIEW COMPARISON:  11/07/2020 FINDINGS: NG tube in the stomach. Feeding tube enters the stomach with the tip not visualized. Endotracheal tube removed since the prior study. Improvement in right lower lobe airspace disease. No change in left lower lobe airspace disease. Small left effusion is present. Cardiac enlargement without heart failure or edema. IMPRESSION: Improvement in right lower lobe airspace disease. No change in left lower lobe airspace disease and small left effusion Interval removal of endotracheal tube. Electronically Signed   By: Franchot Gallo M.D.   On: 11/14/2020 15:13    Scheduled Meds:  albuterol  2.5 mg Nebulization Once   bisacodyl  10 mg Rectal Daily   chlorhexidine gluconate (MEDLINE KIT)  15 mL Mouth Rinse BID   Chlorhexidine Gluconate Cloth  6 each Topical Daily   enoxaparin (LOVENOX) injection  40 mg Subcutaneous Q24H   insulin aspart  0-20 Units Subcutaneous Q4H   levothyroxine  150 mcg Per Tube Q0600   mouth rinse  15 mL Mouth Rinse BID   mouth rinse  15 mL Mouth Rinse 10 times per day   metoCLOPramide (REGLAN) injection  10 mg Intravenous Q8H   pantoprazole (PROTONIX) IV  40 mg Intravenous Daily   sodium chloride flush  10-40 mL Intracatheter Q12H   Continuous Infusions:  sodium chloride 400 mL/hr at 11/13/20 1149   sodium chloride 20 mL/hr at 11/13/20 1149   levETIRAcetam Stopped (11/13/20 1130)   TPN ADULT (ION) 75 mL/hr  at 11/13/20 1149   TPN ADULT (ION)      LOS: 19 days   Kerney Elbe, DO [facility] PAGER is on AMION  If 7PM-7AM, please contact night-coverage [email or web address]

## 2020-11-13 NOTE — Progress Notes (Signed)
Peripherally Inserted Central Catheter Placement  The IV Nurse has discussed with the patient and/or persons authorized to consent for the patient, the purpose of this procedure and the potential benefits and risks involved with this procedure.  The benefits include less needle sticks, lab draws from the catheter, and the patient may be discharged home with the catheter. Risks include, but not limited to, infection, bleeding, blood clot (thrombus formation), and puncture of an artery; nerve damage and irregular heartbeat and possibility to perform a PICC exchange if needed/ordered by physician.  Alternatives to this procedure were also discussed.  Bard Power PICC patient education guide, fact sheet on infection prevention and patient information card has been provided to patient /or left at bedside.    PICC Placement Documentation  PICC Double Lumen 81/27/51 PICC Left Basilic 46 cm 0 cm (Active)  Indication for Insertion or Continuance of Line Administration of hyperosmolar/irritating solutions (i.e. TPN, Vancomycin, etc.) 11/13/20 1851  Exposed Catheter (cm) 0 cm 11/13/20 1851  Site Assessment Clean;Dry;Intact 11/13/20 1851  Lumen #1 Status Flushed;Saline locked;Blood return noted 11/13/20 1851  Lumen #2 Status Flushed;Saline locked;Blood return noted;Infusing 11/13/20 1851  Dressing Type Transparent 11/13/20 1851  Dressing Status Clean;Dry;Intact 11/13/20 1851  Antimicrobial disc in place? Yes 11/13/20 1851  Safety Lock Not Applicable 70/01/74 9449  Line Care Connections checked and tightened 11/13/20 1851  Line Adjustment (NICU/IV Team Only) No 11/13/20 1851  Dressing Intervention New dressing 11/13/20 1851  Dressing Change Due 11/20/20 11/13/20 1851       Rolena Infante 11/13/2020, 6:52 PM

## 2020-11-13 NOTE — Progress Notes (Signed)
Securechat with Dr Hassell Done re PICC line. Unable to obtain blood return x 1 port. Per CXR, PICC could be advanced for optimum placement.  PICC Exchange ordered per verbal order.

## 2020-11-13 NOTE — Progress Notes (Signed)
Spoke with Lannette Donath RN re exchange PICC order.  Notified plan to exchange around 1700 due to TNA.  RN to notify the pt.

## 2020-11-14 ENCOUNTER — Inpatient Hospital Stay (HOSPITAL_COMMUNITY): Payer: Medicare PPO

## 2020-11-14 DIAGNOSIS — I469 Cardiac arrest, cause unspecified: Secondary | ICD-10-CM | POA: Diagnosis not present

## 2020-11-14 DIAGNOSIS — D72829 Elevated white blood cell count, unspecified: Secondary | ICD-10-CM

## 2020-11-14 DIAGNOSIS — R1111 Vomiting without nausea: Secondary | ICD-10-CM | POA: Diagnosis not present

## 2020-11-14 DIAGNOSIS — J9601 Acute respiratory failure with hypoxia: Secondary | ICD-10-CM | POA: Diagnosis not present

## 2020-11-14 DIAGNOSIS — Z903 Acquired absence of stomach [part of]: Secondary | ICD-10-CM | POA: Diagnosis not present

## 2020-11-14 LAB — GLUCOSE, CAPILLARY
Glucose-Capillary: 175 mg/dL — ABNORMAL HIGH (ref 70–99)
Glucose-Capillary: 175 mg/dL — ABNORMAL HIGH (ref 70–99)
Glucose-Capillary: 183 mg/dL — ABNORMAL HIGH (ref 70–99)
Glucose-Capillary: 189 mg/dL — ABNORMAL HIGH (ref 70–99)
Glucose-Capillary: 195 mg/dL — ABNORMAL HIGH (ref 70–99)
Glucose-Capillary: 213 mg/dL — ABNORMAL HIGH (ref 70–99)
Glucose-Capillary: 230 mg/dL — ABNORMAL HIGH (ref 70–99)

## 2020-11-14 LAB — POCT I-STAT 7, (LYTES, BLD GAS, ICA,H+H)
Acid-base deficit: 7 mmol/L — ABNORMAL HIGH (ref 0.0–2.0)
Acid-base deficit: 7 mmol/L — ABNORMAL HIGH (ref 0.0–2.0)
Bicarbonate: 19 mmol/L — ABNORMAL LOW (ref 20.0–28.0)
Bicarbonate: 17.8 mmol/L — ABNORMAL LOW (ref 20.0–28.0)
Calcium, Ion: 1.3 mmol/L (ref 1.15–1.40)
Calcium, Ion: 1.31 mmol/L (ref 1.15–1.40)
HCT: 24 % — ABNORMAL LOW (ref 36.0–46.0)
HCT: 24 % — ABNORMAL LOW (ref 36.0–46.0)
Hemoglobin: 8.2 g/dL — ABNORMAL LOW (ref 12.0–15.0)
Hemoglobin: 8.2 g/dL — ABNORMAL LOW (ref 12.0–15.0)
O2 Saturation: 99 %
O2 Saturation: 91 %
Patient temperature: 98.7
Patient temperature: 98.7
Potassium: 4.4 mmol/L (ref 3.5–5.1)
Potassium: 4.4 mmol/L (ref 3.5–5.1)
Sodium: 148 mmol/L — ABNORMAL HIGH (ref 135–145)
Sodium: 147 mmol/L — ABNORMAL HIGH (ref 135–145)
TCO2: 20 mmol/L — ABNORMAL LOW (ref 22–32)
TCO2: 19 mmol/L — ABNORMAL LOW (ref 22–32)
pCO2 arterial: 37.2 mmHg (ref 32.0–48.0)
pCO2 arterial: 33.8 mmHg (ref 32.0–48.0)
pH, Arterial: 7.316 — ABNORMAL LOW (ref 7.350–7.450)
pH, Arterial: 7.33 — ABNORMAL LOW (ref 7.350–7.450)
pO2, Arterial: 152 mmHg — ABNORMAL HIGH (ref 83.0–108.0)
pO2, Arterial: 65 mmHg — ABNORMAL LOW (ref 83.0–108.0)

## 2020-11-14 LAB — PHOSPHORUS: Phosphorus: 3.2 mg/dL (ref 2.5–4.6)

## 2020-11-14 LAB — CBC WITH DIFFERENTIAL/PLATELET
Abs Immature Granulocytes: 1.02 10*3/uL — ABNORMAL HIGH (ref 0.00–0.07)
Basophils Absolute: 0 10*3/uL (ref 0.0–0.1)
Basophils Relative: 0 %
Eosinophils Absolute: 0 10*3/uL (ref 0.0–0.5)
Eosinophils Relative: 0 %
HCT: 30 % — ABNORMAL LOW (ref 36.0–46.0)
Hemoglobin: 8.5 g/dL — ABNORMAL LOW (ref 12.0–15.0)
Immature Granulocytes: 4 %
Lymphocytes Relative: 5 %
Lymphs Abs: 1.4 10*3/uL (ref 0.7–4.0)
MCH: 28.9 pg (ref 26.0–34.0)
MCHC: 28.3 g/dL — ABNORMAL LOW (ref 30.0–36.0)
MCV: 102 fL — ABNORMAL HIGH (ref 80.0–100.0)
Monocytes Absolute: 2 10*3/uL — ABNORMAL HIGH (ref 0.1–1.0)
Monocytes Relative: 7 %
Neutro Abs: 24.3 10*3/uL — ABNORMAL HIGH (ref 1.7–7.7)
Neutrophils Relative %: 84 %
Platelets: 271 10*3/uL (ref 150–400)
RBC: 2.94 MIL/uL — ABNORMAL LOW (ref 3.87–5.11)
RDW: 17 % — ABNORMAL HIGH (ref 11.5–15.5)
WBC: 28.7 10*3/uL — ABNORMAL HIGH (ref 4.0–10.5)
nRBC: 0 % (ref 0.0–0.2)

## 2020-11-14 LAB — COMPREHENSIVE METABOLIC PANEL
ALT: 21 U/L (ref 0–44)
AST: 26 U/L (ref 15–41)
Albumin: 1.5 g/dL — ABNORMAL LOW (ref 3.5–5.0)
Alkaline Phosphatase: 74 U/L (ref 38–126)
Anion gap: 7 (ref 5–15)
BUN: 50 mg/dL — ABNORMAL HIGH (ref 8–23)
CO2: 20 mmol/L — ABNORMAL LOW (ref 22–32)
Calcium: 8.1 mg/dL — ABNORMAL LOW (ref 8.9–10.3)
Chloride: 120 mmol/L — ABNORMAL HIGH (ref 98–111)
Creatinine, Ser: 1.12 mg/dL — ABNORMAL HIGH (ref 0.44–1.00)
GFR, Estimated: 51 mL/min — ABNORMAL LOW (ref >60)
Glucose, Bld: 201 mg/dL — ABNORMAL HIGH (ref 70–99)
Potassium: 4.1 mmol/L (ref 3.5–5.1)
Sodium: 147 mmol/L — ABNORMAL HIGH (ref 135–145)
Total Bilirubin: 2.1 mg/dL — ABNORMAL HIGH (ref 0.3–1.2)
Total Protein: 5.7 g/dL — ABNORMAL LOW (ref 6.5–8.1)

## 2020-11-14 LAB — URINALYSIS, ROUTINE W REFLEX MICROSCOPIC
Bilirubin Urine: NEGATIVE
Glucose, UA: 150 mg/dL — AB
Ketones, ur: NEGATIVE mg/dL
Nitrite: NEGATIVE
Protein, ur: 30 mg/dL — AB
Specific Gravity, Urine: 1.018 (ref 1.005–1.030)
pH: 6 (ref 5.0–8.0)

## 2020-11-14 LAB — PROCALCITONIN: Procalcitonin: 3.04 ng/mL

## 2020-11-14 LAB — TRIGLYCERIDES: Triglycerides: 133 mg/dL (ref <150)

## 2020-11-14 LAB — VITAMIN B12: Vitamin B-12: 1132 pg/mL — ABNORMAL HIGH (ref 180–914)

## 2020-11-14 LAB — MAGNESIUM: Magnesium: 2.3 mg/dL (ref 1.7–2.4)

## 2020-11-14 LAB — PREALBUMIN: Prealbumin: 7.3 mg/dL — ABNORMAL LOW (ref 18–38)

## 2020-11-14 LAB — LACTIC ACID, PLASMA: Lactic Acid, Venous: 1.3 mmol/L (ref 0.5–1.9)

## 2020-11-14 MED ORDER — INSULIN REGULAR HUMAN 100 UNIT/ML IJ SOLN
INTRAMUSCULAR | Status: AC
  Filled 2020-11-14: qty 1080

## 2020-11-14 MED ORDER — LEVALBUTEROL HCL 0.63 MG/3ML IN NEBU
0.6300 mg | INHALATION_SOLUTION | Freq: Four times a day (QID) | RESPIRATORY_TRACT | Status: DC
  Administered 2020-11-14 – 2020-11-19 (×19): 0.63 mg via RESPIRATORY_TRACT
  Filled 2020-11-14 (×21): qty 3

## 2020-11-14 MED ORDER — IPRATROPIUM BROMIDE 0.02 % IN SOLN: 0.5000 mg | Freq: Four times a day (QID) | RESPIRATORY_TRACT | Status: DC

## 2020-11-14 MED ORDER — PROPOFOL 1000 MG/100ML IV EMUL
INTRAVENOUS | Status: AC
  Filled 2020-11-14: qty 100

## 2020-11-14 MED ORDER — IOHEXOL 9 MG/ML PO SOLN
ORAL | Status: AC
  Filled 2020-11-14: qty 1000

## 2020-11-14 MED ORDER — FUROSEMIDE 10 MG/ML IJ SOLN
40.0000 mg | Freq: Once | INTRAMUSCULAR | Status: AC
  Administered 2020-11-14: 40 mg via INTRAVENOUS
  Filled 2020-11-14: qty 4

## 2020-11-14 MED ORDER — ROCURONIUM BROMIDE 10 MG/ML (PF) SYRINGE
PREFILLED_SYRINGE | INTRAVENOUS | Status: AC
  Filled 2020-11-14: qty 10

## 2020-11-14 MED ORDER — FENTANYL CITRATE (PF) 100 MCG/2ML IJ SOLN
INTRAMUSCULAR | Status: AC
  Filled 2020-11-14: qty 2

## 2020-11-14 MED ORDER — IPRATROPIUM BROMIDE 0.02 % IN SOLN
0.5000 mg | Freq: Four times a day (QID) | RESPIRATORY_TRACT | Status: DC
  Administered 2020-11-14 – 2020-11-19 (×19): 0.5 mg via RESPIRATORY_TRACT
  Filled 2020-11-14 (×19): qty 2.5

## 2020-11-14 MED ORDER — ETOMIDATE 2 MG/ML IV SOLN
INTRAVENOUS | Status: AC
  Filled 2020-11-14: qty 20

## 2020-11-14 MED ORDER — SODIUM CHLORIDE 0.9 % IV SOLN
2.0000 g | Freq: Two times a day (BID) | INTRAVENOUS | Status: DC
  Administered 2020-11-14: 2 g via INTRAVENOUS
  Filled 2020-11-14 (×2): qty 2

## 2020-11-14 MED ORDER — METRONIDAZOLE 500 MG/100ML IV SOLN
500.0000 mg | Freq: Three times a day (TID) | INTRAVENOUS | Status: DC
  Administered 2020-11-14 – 2020-11-17 (×9): 500 mg via INTRAVENOUS
  Filled 2020-11-14 (×9): qty 100

## 2020-11-14 MED ORDER — MIDAZOLAM HCL 2 MG/2ML IJ SOLN
INTRAMUSCULAR | Status: AC
  Filled 2020-11-14: qty 2

## 2020-11-14 MED ORDER — SODIUM CHLORIDE 0.9 % IV SOLN
1.0000 mg | Freq: Once | INTRAVENOUS | Status: AC
  Administered 2020-11-14: 1 mg via INTRAVENOUS
  Filled 2020-11-14: qty 0.2

## 2020-11-14 MED ORDER — LEVOTHYROXINE SODIUM 100 MCG/5ML IV SOLN
75.0000 ug | Freq: Every day | INTRAVENOUS | Status: DC
  Administered 2020-11-15 – 2020-11-21 (×7): 75 ug via INTRAVENOUS
  Filled 2020-11-14 (×7): qty 5

## 2020-11-14 MED ORDER — VANCOMYCIN HCL IN DEXTROSE 1-5 GM/200ML-% IV SOLN
1000.0000 mg | INTRAVENOUS | Status: DC
  Administered 2020-11-14: 1000 mg via INTRAVENOUS
  Filled 2020-11-14: qty 200

## 2020-11-14 MED ORDER — DEXTROSE 5 % IV SOLN: INTRAVENOUS | Status: DC

## 2020-11-14 MED ORDER — SODIUM CHLORIDE 0.9 % IV SOLN
2.0000 g | INTRAVENOUS | Status: DC
  Administered 2020-11-14: 2 g via INTRAVENOUS
  Filled 2020-11-14: qty 20

## 2020-11-14 MED ORDER — KETAMINE HCL 50 MG/5ML IJ SOSY
PREFILLED_SYRINGE | INTRAMUSCULAR | Status: AC
  Filled 2020-11-14: qty 5

## 2020-11-14 MED ORDER — LEVALBUTEROL HCL 0.63 MG/3ML IN NEBU: 0.6300 mg | INHALATION_SOLUTION | Freq: Four times a day (QID) | RESPIRATORY_TRACT | Status: DC

## 2020-11-14 NOTE — Progress Notes (Addendum)
NAME:  [name], MRN:  [id number], DOB:  Jan 17, 1946, LOS: 79 ADMISSION DATE:  10/07/2020, CONSULTATION DATE:  7/5 REFERRING MD:  Barry Dienes, CHIEF COMPLAINT:  Cardiac arrest   History of Present Illness:  [name] is a 75 y.o. 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 She had procedure done 6/28.  Pathology was negative for malignancy but showed LGD with negative nodes.   Early AM 7/5, she had wheezing so received a breathing treatment.  Shortly thereafter, she was found with emesis all over bed and floor.  She had gurgling sounds and altered LOC.  She ended up having loss of pulses with initial rhythm asystole.  She received 1mg  epinephrine before PEA for which she received a 2nd dose of epinephrine.  She then had ROSC after 6 minutes.  She was intubated during the code.   She was transferred to [facility] ICU where PCCM was asked to assist with vent management.  Pertinent  Medical History  OSA DM2 Hypertension CAD Allergic rhinitis  Significant Hospital Events: Including procedures, antibiotic start and stop dates in addition to other pertinent events   6/28 admit, robotic distal gastrectomy,  7/5 Six minute code (asystole then PEA), severe hypoxemic respiratory failure and shock 7/5 levaquin x1 7/5 ceftriaxone > 7/6 7/5 doxycycline > 7/6 7/6 cefepime >  7/6 EEG > generalized slowing suggestive of severe diffuse encephalopathy non-specific in etiology  7/6 TTE > LVEF 65-70%, grade II diastolic dysfunction, RSVP elevated 58.1 mmHg, trivial MR 7/12 extubated 7/13 To PCU, [facility] 7/18 PCCM called back for respiratory distress   Interim History / Subjective:  Afebrile  RN reports HR 130's / 140's, BP stable.  Increased respiratory effort, pt placed on BiPAP.  Cortrak removed per CCS.  NPO.  NGT to suction.   Objective   Blood pressure  108/88, pulse (!) 130, temperature 97.8 F (36.6 C), resp. rate (!) 40, height 4\' 11"  (1.499 m), weight 98.8 kg, SpO2 96 %.        Intake/Output Summary (Last 24 hours) at 11/14/2020 1637 Last data filed at 11/14/2020 1637 Gross per 24 hour  Intake 1255.18 ml  Output 1425 ml  Net -169.82 ml   Filed Weights   11/03/2020 0500 11/12/20 0458 11/14/20 0500  Weight: 94.5 kg 95.2 kg 98.8 kg    Examination: General: frail elderly female lying in bed on BiPAP HEENT: MM pink/moist, BiPAP mask in place, anicteric, pupils equal / reactive  Neuro: Awake, alert CV: s1s2 RRR, ST 130-140's on monitor, no m/r/g PULM: tachypnic but not labored, lungs bilaterally coarse anterior, diminished lateral bases bilaterally GI: soft, bsx4 active  Extremities: warm/dry, trace LE edema  Skin: no rashes or lesions   Resolved Hospital Problem list   Septic shock secondary to aspiration PNA Cardiac Arrest secondary to Aspiration  AKI  Hyperkalemia   Assessment & Plan:    Post-Op Gastrectomy  Concern for Ileus (7/18)  Cortrak removed per CCS 7/18.  CCS concerned for possible efferent limb obstruction.  -continue TPN  -NPO  -await CCS decision regarding return to surgery for possible efferent limb obstruction -pending repeat CT ABD/Pelvis   Tachypnea / Acute Respiratory Distress  Acute Hypoxemic Respiratory Failure Hx OSA / Probable OHS Acute tachypnea thought related to [place] with appropriate compensation by [facility].  She previously had an aspiration PNA and was treated with rocephin, required MV / extubated 7/12, and  now has severe deconditioning.  Likely underlying OHS given pCO2 on ABG. -PRN BiPAP for respiratory support  -correct underlying NGMA -ABG with appropriate compensation for NGMA  -lasix 40 mg IV x1, may need repeat dosing   Sepsis  No clear source, possible aspiration, UTI -assess lactic acid, PCT  -expand abx for nosocomial coverage > cefepime, vanco + flagyl -assess BCx2,  UC  NGMA secondary to Hyperchloremic Acidosis  Hypernatremia  -D5w at 50 ml/hr until am, reassess labs in am    CKD Hypokalemia  -monitor, replace as appropriate  -Trend BMP / urinary output -Replace electrolytes as indicated -Avoid nephrotoxic agents, ensure adequate renal perfusion  Macrocytic Anemia Hgb 7.5-9 baseline -trend CBC  -transfuse for Hgb <7% or active bleeding -add IV folate  -assess B12, at risk for deficiency with gastrectomy, PPI use  Severe Protein Calorie Malnutrition  Albumin 1.5 / Pre-albumin 7.3 -TPN per pharmacy   -follow labs closely   Acute Metabolic Encephalopathy  Hx Anxiety, Depression In setting of hypernatremia, azotemia, critical illness.  -supportive care  -limit all sedating medications   Hypothyroidism  -change synthroid to IV  DM with Hyperglycemia  -SSI, resistant scale  -monitor glucose trends with addition of free water via D5w    Best Practice:  DVT: lovenox  Feeding: TPN GI: protonix  Disposition: ICU  Critical Care Time: 33 minutes   Noe Gens, MSN, APRN, NP-C, AGACNP-BC [facility] 11/14/2020, 4:40 PM   Please see [email or web address] for pager details.   From 7A-7P if no response, please call [phone] After hours, please call ELink [phone]

## 2020-11-14 NOTE — Progress Notes (Signed)
Patient transported to 2H04 from 4NP01 on bipap. RN at bedside.

## 2020-11-14 NOTE — Progress Notes (Signed)
PHARMACY - TOTAL PARENTERAL NUTRITION CONSULT NOTE  Indication: Partial gastrectomy and TF intolerance   Patient Measurements: Height: 4\' 11"  (149.9 cm) Weight: 98.8 kg (217 lb 13 oz) IBW/kg (Calculated) : 43.2 TPN AdjBW (KG): 56.1 Body mass index is 43.99 kg/m. Usual Weight: 95 kg  Assessment:  75 yo W admitted for 6/28 elective distal gastrectomy for malignant polyp. Patient had a 6 minute code on 7/5 with severe respiratory failure and shock. Patient had an ileus and presumed functional gastric outlet obstruction post-op.  Patient tolerated TF 7/5-7/7 at 20 ml/hr, 7/7- 7/8 at 30 ml/hr, 7/9-7/10 at 40 ml/hr (goal 55 ml/hr). TF were held due to access issues then restarted 7/12 at 20-30 ml/hr. TF were stopped 7/13 for large volume bilious emesis that does not look like TF. Weight unchanged from usual but had anasarca earlier this admit, now resolved.   Glucose / Insulin: hx DM on glipizide PTA - CBGs elevated post TPN initiation. Received 27 units insulin in past 24 hrs + 40 units in TPN Electrolytes: Na/CL 147/120, K 4.1 (SPS x1 7/12), CoCa down to 9.7 (none in TPN), others WNL Renal: SCr 1.12, BUN 37>50, CO2 down to 20  7GI/Hepatic: LFTs WNL, tbili 2.1 (sclera yellow per RN on 7/15), albumin 1.5, prealbumin down to 7.3, TG wnl Intake / Output; MIVF:  UOP 0.5 ml/kg/hr, NG 26mL, LBM x2 on 7/16 Pertinent GI Imaging: 7/11 CT - wall thickening at [place] anastomosis, no evidence of outlet obstruction 7/12 Cortrak advanced to small bowel GI Surgeries / Procedures:  6/28 elective distal gastrectomy 7/15 EGD - normal esophagus, jejunal stenosis, antrectomy found  Central access: triple IJ placed 11/01/20, PICC placed 11/10/20 TPN start date: 11/10/20  Nutritional Goals (per RD rec on 7/14): kCal: 1800-2000, Protein: 100-120, Fluid: >/= 1.8 L/d  Current Nutrition:  TPN Off TF 7/13  Plan:  Continue TPN at goal rate 75 ml/hr to provide 108g AA, 252g CHO and 54g ILE for a total of 1829 kCal,  meeting 100% of patient needs Electrolytes in TPN: Na 91mEq/L, Continue K 89mEq/L, Add Ca 59mEq/L, continue Mg 85mEq/L, Phos 49mmol/L, max acetate Add standard MVI and 1/2 trace elements to TPN Continue resistant SSI Q4H and continue regular insulin in TPN at 60 units Standard TPN labs on Mon/Thurs Monitor CBGs, tbili/jaundice, may need to increase free water in TPN   Albertina Parr, PharmD., BCPS, BCCCP Clinical Pharmacist Please refer to [facility] for unit-specific pharmacist

## 2020-11-14 NOTE — Progress Notes (Signed)
   11/13/20 2220  Assess: MEWS Score  Temp 97.9 F (36.6 C)  BP 129/68  Pulse Rate (!) 108  ECG Heart Rate (!) 107  Resp (!) 28  Level of Consciousness Alert  SpO2 100 %  Patient Activity (if Appropriate) In bed  Assess: MEWS Score  MEWS Temp 0  MEWS Systolic 0  MEWS Pulse 1  MEWS RR 2  MEWS LOC 0  MEWS Score 3  MEWS Score Color Yellow  Assess: if the MEWS score is Yellow or Red  Were vital signs taken at a resting state? Yes  Focused Assessment No change from prior assessment  Early Detection of Sepsis Score *See Row Information* High  MEWS guidelines implemented *See Row Information* Yes  Treat  Pain Score 0  Take Vital Signs  Increase Vital Sign Frequency  Yellow: Q 2hr X 2 then Q 4hr X 2, if remains yellow, continue Q 4hrs  Escalate  MEWS: Escalate Yellow: discuss with charge nurse/RN and consider discussing with provider and RRT  Notify: Charge Nurse/RN  Name of Charge Nurse/RN Notified Trilby Drummer   Date Charge Nurse/RN Notified 11/13/20  Time Charge Nurse/RN Notified 2233  Notify: Provider  Provider Name/Title Wilson  Date Provider Notified 11/13/20  Time Provider Notified 2228  Notification Type Page  Notification Reason Critical result;Other (Comment) ("possible sepsis")  Provider response Evaluate remotely;No new orders  Date of Provider Response 11/13/20  Time of Provider Response 2230

## 2020-11-14 NOTE — Progress Notes (Signed)
Physical Therapy Treatment Patient Details Name: [name] MRN: [id number] DOB: [birth year] Today's Date: 11/14/2020    History of Present Illness Pt is a 75 y/o female admitted 6/28 for elective robotic distal gastrectomy due to intramucosal adenocarcinoma, complicated by 6 minute code (asystole then PEA) on 7/5.  EEG 7/6 with severe diffuse encephalopathy. 7/10 MRI negative.  7/12 extubated. PMH: obesity, hiatal hernia, CHF, CAD, h/o CVA.    PT Comments    The pt was seen for PT session with goal of progressing OOB mobility and tolerance. The pt was agreeable to session, but with initiation of transition to sitting EOB, the pt's RR increased to 40s, HR to 135bpm, and pt with markedly increased anxiety. The pt was able to reduce RR to 20s with max cues for PLB, but was unable to maintain for more than a few breaths at a time. The pt was given full posterior support in attempts to help reduce anxiety, but to no avail. The pt's RN was called in, and the pt was returned to supine due to sustained tachycardia in 150s. Once in supine, the pt continued to require cues for slowed breathing, but HR gradually returned to 120s. Pt left in supine with music playing for comfort and 4L O2.     Follow Up Recommendations  CIR     Equipment Recommendations  Wheelchair (measurements PT)    Recommendations for Other Services       Precautions / Restrictions Precautions Precautions: Fall Precaution Comments: cortrak, NG, pt on bipap upon arrival Restrictions Weight Bearing Restrictions: No    Mobility  Bed Mobility Overal bed mobility: Needs Assistance Bed Mobility: Supine to Sit;Sit to Supine;Rolling Rolling: Min guard   Supine to sit: Mod assist;+2 for physical assistance Sit to supine: Mod assist;+2 for physical assistance   General bed mobility comments: modA to complete movements, pt reaching frantically for assist due to anxiety, needing calm redirection for placement of hands to  assist with mobility. mod-maxA of 2 to complete transisions.    Transfers                 General transfer comment: deferred due to RR and HR elevation in sitting. RR 18-40; HR consistently 145-155bpm     Balance Overall balance assessment: Needs assistance Sitting-balance support: Feet supported;Single extremity supported;Bilateral upper extremity supported Sitting balance-[name]: Poor Sitting balance - Comments: reliant on BUE support with modA or posterior support Postural control: Posterior lean                                  Cognition Arousal/Alertness: Awake/alert Behavior During Therapy: Anxious Overall Cognitive Status: Impaired/Different from baseline Area of Impairment: Following commands;Safety/judgement;Awareness;Problem solving                   Current Attention Level: Focused Memory: Decreased short-term memory;Decreased recall of precautions Following Commands: Follows one step commands consistently;Follows one step commands with increased time;Follows multi-step commands inconsistently Safety/Judgement: Decreased awareness of safety;Decreased awareness of deficits Awareness: Emergent Problem Solving: Slow processing;Requires verbal cues General Comments: pt with significant anxiety in regards to SOB. needing max cues for slowed breathing, safety with sitting/positioning, and technique for movement. Pt needing redirection for anxiety through entire session      Exercises      General Comments General comments (skin integrity, edema, etc.): RR 39-40 upon arrival, able to slow to 20 wiht guided breathing but mostly maintained  35-40. HR increased to 155 bpm with anxiety sitting EOB, OOB mobility deferred      Pertinent Vitals/Pain Pain Assessment: No/denies pain Pain Score: 0-No pain Faces Pain Scale: No hurt Pain Intervention(s): Limited activity within patient's tolerance;Monitored during session;Repositioned     PT  Goals (current goals can now be found in the care plan section) Acute Rehab PT Goals Patient Stated Goal: get home PT Goal Formulation: With patient Time For Goal Achievement: 11/22/20 Potential to Achieve Goals: Good Progress towards PT goals: Progressing toward goals    Frequency    Min 3X/week      PT Plan Current plan remains appropriate    Co-evaluation PT/OT/SLP Co-Evaluation/Treatment: Yes Reason for Co-Treatment: Necessary to address cognition/behavior during functional activity;For patient/therapist safety;To address functional/ADL transfers PT goals addressed during session: Mobility/safety with mobility;Balance;Strengthening/ROM        AM-PAC PT "6 Clicks" Mobility   Outcome Measure  Help needed turning from your back to your side while in a flat bed without using bedrails?: A Little Help needed moving from lying on your back to sitting on the side of a flat bed without using bedrails?: A Lot Help needed moving to and from a bed to a chair (including a wheelchair)?: Total Help needed standing up from a chair using your arms (e.g., wheelchair or bedside chair)?: Total Help needed to walk in hospital room?: Total Help needed climbing 3-5 steps with a railing? : Total 6 Click Score: 9    End of Session Equipment Utilized During Treatment: Oxygen Activity Tolerance: Treatment limited secondary to medical complications (Comment) (anxiety, tachycardia) Patient left: in bed;with call bell/phone within reach;with bed alarm set (4L O2, chair position) Nurse Communication: Mobility status PT Visit Diagnosis: Other abnormalities of gait and mobility (R26.89);Muscle weakness (generalized) (M62.81)     Time: 6803-2122 PT Time Calculation (min) (ACUTE ONLY): 32 min  Charges:  $Therapeutic Activity: 8-22 mins                     Inocencio Homes, PT, DPT   Acute Rehabilitation Department Pager #: [phone]   Otho Bellows 11/14/2020, 11:32 AM

## 2020-11-14 NOTE — Progress Notes (Signed)
Patient having increased heart rate and respirations, paged MD and respiratory, respiratory came to bedside and paced pt on bipap. New order from MD to transfer pt to ICU. Report called and given to receiving RN, pt transferred to [facility] with all belongings.

## 2020-11-14 NOTE — Progress Notes (Signed)
Pharmacy Antibiotic Note  [name] is a 75 y.o. female admitted on 10/04/2020 with sepsis.  Pharmacy has been consulted for Vanc and Cefepime dosing. CrCL 43 ml/min.  Vancomycin 1000 mg IV Q 24 hrs. Goal AUC 400-550. Expected AUC: 500 SCr used: 1.12   Plan: Vanc 1000 mg IV q24hr Cefepime 2 gms IV q12hr Monitor renal function, clinical status, C&S and vanc levels as needed  Height: 4\' 11"  (149.9 cm) Weight: 98.8 kg (217 lb 13 oz) IBW/kg (Calculated) : 43.2  Temp (24hrs), Avg:98.2 F (36.8 C), Min:97.7 F (36.5 C), Max:98.8 F (37.1 C)  Recent Labs  Lab 11/10/20 0256 10/28/2020 0022 11/12/20 0242 11/13/20 0025 11/14/20 0634  WBC 11.5* 12.5* 19.6* 19.6* 28.7*  CREATININE 1.08* 1.06* 1.05* 1.02* 1.12*    Estimated Creatinine Clearance: 43.4 mL/min (A) (by C-G formula based on SCr of 1.12 mg/dL (H)).    Allergies  Allergen Reactions   Codeine Itching   Meperidine Hcl Itching   Morphine Itching   Nylon Other (See Comments)    Stitches cause infection   Penicillins Hives, Itching and Rash    Tolerated cefepime and ceftriaxone Has patient had a PCN reaction causing immediate rash, facial/tongue/throat swelling, SOB or lightheadedness with hypotension: Yes Has patient had a PCN reaction causing severe rash involving mucus membranes or skin necrosis: Yes Has patient had a PCN reaction that required hospitalization: No Has patient had a PCN reaction occurring within the last 10 years: No If all of the above answers are "NO", then may proceed with Cephalosporin use.     Antimicrobials this admission: Vanc 7/18 >>  Cefep 7/18 >>   Thank you for allowing pharmacy to be a part of this patient's care.  Alanda Slim, PharmD, [facility] Clinical Pharmacist Please see AMION for all Pharmacists' Contact Phone Numbers 11/14/2020, 6:58 PM

## 2020-11-14 NOTE — Progress Notes (Signed)
PROGRESS NOTE    [name]  MVE:[id number] DOB: 06/13/1945 DOA: 10/02/2020 PCP: Harlan Stains, MD   Brief Narrative:  The patient is a morbidly obese Caucasian female with a past medical history significant for but not limited to OSA, diabetes mellitus type 2, hypertension, CAD, history of allergic rhinitis and as well as other comorbidities who was admitted 09/28/2020 for elective robotic distal gastrectomy as surgical intervention for recently found EGD polyp of the gastric antrum with focal intramucosal adenocarcinoma.  She had the procedure done on 10/26/2019.  Pathology was negative malignancy but did show EGD with negative nodes.  Subsequently early a.m. she had wheezing and received a breathing treatment.  Shortly thereafter she was found with emesis all over the bed and sore.  She had gurgling sounds and altered Loss of consciousness.  She ended up having a loss of pulse with initial rhythm of asystole.  She received 1 mg epinephrine before PEA which she received a second dose of epinephrine.  She then had ROSC after 6 minutes and she was intubated during the code.  She was transferred to [facility] was asked to manage and assist with the vent.  On 7 5 she was initially started on Levaquin but then changed to ceftriaxone and then doxycycline and subsequently escalated to IV cefepime.  Given her code she underwent a EEG which showed generalized slowing of severe diffuse diffuse encephalopathy nonspecific etiology.  Transesophageal echocardiogram done which showed a LVEF of 65 to 70% and grade 2 diastolic dysfunction.  She subsequently extubated on 11/08/2020 and critical care signed off on 11/09/2020 however TRH was asked to pick up for medical management. BP was still elevated but will make further adjustments.   Patient underwent an EGD yesterday by Dr. Benson Norway and she was found to have evidence of an antrectomy in the gastric body that was characterized by edema as well as a severe  benign-appearing intrinsic stenosis that was not traversed the jejunum.  She is noted to have a significant amount of bilious fluid in the remnant gastric body and fundus of the stomach and the fluid was evacuated by NG tube and the feeding tube was noted to be coiled in the gastric lumen.  Both tips were not beyond the anastomosis and the gastrojejunostomy was edematous.  She was found to have an obstruction and in the efferent lumen and General surgery and GI both recommending continuing TPN.  Because of the patient was unable to return blood to report chest x-ray was done and PICC line could be advanced for optimal placement and a PICC line exchange order was placed per Dr. Hassell Done.  WBC continues to worsen and she was on BiPAP this AM. Will Pan-Cx we will repeat a CT scan of the chest abdomen pelvis.  We will also provide Lasix and breathing treatments given her tachypnea and her continued use for BiPAP.  We will need to monitor carefully  Assessment & Plan:   Active Problems:   S/P partial gastrectomy   Cardiac arrest (HCC)   Emesis   Respiratory failure (HCC)   SOB (shortness of breath)   Acute respiratory failure with hypoxemia (HCC)   Encounter for central line placement   Pressure injury of skin  Septic Shock from aspiration pneumonia and tingling vaso-occlusion from sedation and third spacing, improved and resolved -She is status post antibiotics and sepsis physiology has improved significantly -Her echocardiogram showed an EF of 65 to 70% but did show grade 2 diastolic dysfunction -WBC has  gone from 10.1 -> 12.5 -> 11.5 -> 12.5 and is worsening and is now 19.6 x2 -> 28.7 -Continue with Acetaminophen for mild pain or fever -Has Cortrak still; Nausea and vomiting improved -Pan Culture given worsening WBC and Check Blood Cx x2, CXR, and Urinalysis -Urinalysis showed a Cloudy Appearance, Amber Color urine, Small Hgb, Large Leukocytes, Rare Bacteria, 21-50 RBC/HPF, 6-10 Squamous  Epithelial Cells, and 11-20 WBC; Will check Urine Cx -Blood Cx x2 pending   -DG Chest showed "Enlargement of cardiac silhouette with pulmonary vascular congestion and minimal pulmonary edema. Subsegmental atelectasis RIGHT upper lobe unchanged. Aortic Atherosclerosis" -Will start back on IV Ceftriaxone and IV Flagyl  -Will discuss with Surgery about repeating CT Scan of the Chest as well as the Abdomen and Pelvis and have ordered this   Acute respiratory failure with hypoxia in the setting of aspiration pneumonia; On BiPAP  -Cultures have been negative and she is completed her dose of IV antibiotics with ceftriaxone -She is now extubated and respiratory status improves; Wearing BIPAP  -SLP still evaluating as she still has dysphagia -SpO2: 97 % O2 Flow Rate (L/min): 2 L/min FiO2 (%): 30 % -Will give IV Lasix 40 mg x1 -Continue with DuoNeb 3 mL every 4 hours as needed for wheezing or shortness of breath -Start Scheduled Xopenex/Atrovent  -Checking CT Chest/Abd/Pelvis with Contrast   Cardiac arrest secondary to likely aspiration -Achieved ROSC in 6 minutes -Echocardiogram done and showed an EF of 65 to 70% and grade 2 diastolic dysfunction -Continue to Monitor  Postop Gastrectomy  -She did core track in the duodenum and had been tolerating tube feeds until she started vomiting.  -Because of her vomiting his tube feedings have been discontinued and GIs been consulted for EGD this will be done this AM -KUB done and showed Cortrak was in the Stomach so General Surgery recommending NOT to give Tube Feedings -Per primary team and they are recommending continuing TPN and TNA; PICC Line to be exchanged.  -May need CT Scan of the Abdomen and Pelvis   Dysphagia -In the setting of deconditioning -SLP working and she is now n.p.o. and will get TPN f through PICC line -Currently going to be switched over to TPN but 1 tube feedings are resumed they are recommending vital 1.5 Cal at 55 MLS per hour  and Prosource tube feedings at 45 mL per 2 twice daily -Patient's PreAlbumin was 12.7 -Patient getting an EGD today to evaluate GJ Anastamosis and she had an obstruction in the Efferent Limb of the anastomosis.  General surgery and GI both recommending TPN for now  CKD stage IIIa -Patient's BUN/Creatinine Is Now improving and 37/1.08 -> 34/1.06 -> 37/1.05 -> 43/1.02 -> 50/1.12 -We Will Continue Monitor and Trend Avoid Nephrotoxic Medications, Contrast Dyes, Hypotension Renally Dose Medications -Repeat CMP in A.M.  Hypothyroidism -Patient's TSH was 5.572 with a free T4 of 0.47 and a free T3 of 0.6 -Currently getting levothyroxine 50 mcg per tube daily  Intermittent Twitching -She had intermittent twitching after her cardiac arrest and no events were captured on long-term EEG -EEG did not show any interictal and patient did not have any definitive seizures so neurology reduced her Keppra to 250 mg twice daily and recommended the patient remain seizure-free Keppra can be discontinued in 3 months  Hypokalemia -Patient's K+ is now 4.1 -Mag Level was 2.3 -Continue to Monitor and Replete as Necessary  -Repeat CMP in the AM   Hyperbilirubinemia -Patient's T Bili went from 2.1 -> 1.9 ->  2.1 -> 2.1  -Continue to Monitor and Trend -Repeat CMP in the AM   Hypertension -Continue with Amlodipine 10 mg and as needed Hydralazine changed to 10 mg IV q6hprn SBP >180 or DBP >100 -Continue monitor blood pressures per protocol -Last blood pressure reading improved to 137/65 -If still remains elevated and will add Labetalol   Hypernatremia/Hyperchloremia -Patient's Na+ is now 147 again; Chloride Level is now 120 -Continue to Monitor and Trend and repeat CMP in the AM as she is on TPN  Nausea and Vomiting, improved  -TF stopping and GI consulted and evaluated her GJ anastomosis and she is found to have an efferent limb obstruction -New with antiemetics with ondansetron 4 mg p.o./IV every 6 as needed  nausea as well as metoclopramide 10 mg every 8 hours scheduled  Morbid Obesity -Complicates overall prognosis and care -Estimated body mass index is 43.99 kg/m as calculated from the following:   Height as of this encounter: 4' 11"  (1.499 m).   Weight as of this encounter: 98.8 kg. -Weight Loss and Dietary Counseling given  -Nutritionist consulted and recommending J-tube placement given the delayed gastric emptying and persistent nausea vomiting and recommending continue TPN to meet 100% of the needs until enteral nutrition can be restarted.  Once enteral access is appropriate they are recommending vital 1.5 at 55 MLS per hour with Prosource tube feedings 45 mL twice daily -PreAlbumin Level was 12.7 and now dropped to 7.3  DVT prophylaxis: Enoxaparin 40 mg sq q24h Code Status: FULL CODE  Family Communication: No family present at bedside  Disposition Plan: Pending further work-up and clearance by general surgery.  Disposition per primary but likely can be CIR  Status is: Inpatient  Remains inpatient appropriate because:Ongoing diagnostic testing needed not appropriate for outpatient work up, Unsafe d/c plan, IV treatments appropriate due to intensity of illness or inability to take PO, and Inpatient level of care appropriate due to severity of illness  Dispo:  Patient From: Other  Planned Disposition: Inpatient Rehab  Medically stable for discharge: No    Consultants:  General Surgery (Primary) PCCM Neurology  Gastroenterology  [facility]  Procedures: ECHOCARDIOGRAM 11/02/20 IMPRESSIONS     1. Left ventricular ejection fraction, by estimation, is 65 to 70%. The  left ventricle has normal function. The left ventricle has no regional  wall motion abnormalities. Left ventricular diastolic parameters are  consistent with Grade II diastolic  dysfunction (pseudonormalization).   2. Right ventricular systolic function was not well visualized. The right  ventricular size is not well  visualized. There is moderately elevated  pulmonary artery systolic pressure. The estimated right ventricular  systolic pressure is 76.1 mmHg.   3. The mitral valve is grossly normal. Trivial mitral valve  regurgitation. No evidence of mitral stenosis.   4. The aortic valve is tricuspid. There is mild calcification of the  aortic valve. Aortic valve regurgitation is trivial. Mild aortic valve  sclerosis is present, with no evidence of aortic valve stenosis.   Comparison(s): No significant change from prior study.   FINDINGS   Left Ventricle: Left ventricular ejection fraction, by estimation, is 65  to 70%. The left ventricle has normal function. The left ventricle has no  regional wall motion abnormalities. Definity contrast agent was given IV  to delineate the left ventricular   endocardial borders. The left ventricular internal cavity size was normal  in size. There is no left ventricular hypertrophy. Left ventricular  diastolic parameters are consistent with Grade II diastolic dysfunction  (  pseudonormalization).   Right Ventricle: The right ventricular size is not well visualized. Right  vetricular wall thickness was not well visualized. Right ventricular  systolic function was not well visualized. There is moderately elevated  pulmonary artery systolic pressure. The   tricuspid regurgitant velocity is 3.54 m/s, and with an assumed right  atrial pressure of 8 mmHg, the estimated right ventricular systolic  pressure is 34.9 mmHg.   Left Atrium: Left atrial size was normal in size.   Right Atrium: Right atrial size was not well visualized.   Pericardium: There is no evidence of pericardial effusion. Presence of  pericardial fat pad.   Mitral Valve: The mitral valve is grossly normal. Trivial mitral valve  regurgitation. No evidence of mitral valve stenosis.   Tricuspid Valve: The tricuspid valve is normal in structure. Tricuspid  valve regurgitation is trivial. No evidence of  tricuspid stenosis.   Aortic Valve: The aortic valve is tricuspid. There is mild calcification  of the aortic valve. Aortic valve regurgitation is trivial. Mild aortic  valve sclerosis is present, with no evidence of aortic valve stenosis.   Pulmonic Valve: The pulmonic valve was not well visualized. Pulmonic valve  regurgitation is trivial. No evidence of pulmonic stenosis.   Aorta: The aortic root and ascending aorta are structurally normal, with  no evidence of dilitation.   Venous: IVC assessment for right atrial pressure unable to be performed  due to mechanical ventilation.   IAS/Shunts: The interatrial septum was not well visualized.      LEFT VENTRICLE  PLAX 2D  LVIDd:         4.40 cm     Diastology  LVIDs:         2.40 cm     LV e' medial:    6.65 cm/s  LV PW:         0.90 cm     LV E/e' medial:  13.9  LV IVS:        1.00 cm     LV e' lateral:   4.87 cm/s  LVOT diam:     1.70 cm     LV E/e' lateral: 19.0  LV SV:         37  LV SV Index:   19  LVOT Area:     2.27 cm     LV Volumes (MOD)  LV vol d, MOD A2C: 72.7 ml  LV vol d, MOD A4C: 81.3 ml  LV vol s, MOD A2C: 29.8 ml  LV vol s, MOD A4C: 15.8 ml  LV SV MOD A2C:     42.9 ml  LV SV MOD A4C:     81.3 ml  LV SV MOD BP:      55.4 ml   LEFT ATRIUM           Index  LA diam:      3.30 cm 1.70 cm/m  LA Vol (A4C): 54.5 ml 28.02 ml/m   AORTIC VALVE  LVOT Vmax:   86.70 cm/s  LVOT Vmean:  51.500 cm/s  LVOT VTI:    0.163 m     AORTA  Ao Root diam: 2.80 cm  Ao Asc diam:  3.10 cm   MITRAL VALVE               TRICUSPID VALVE  MV Area (PHT): 4.04 cm    TR Peak grad:   50.1 mmHg  MV Decel Time: 188 msec    TR Vmax:  354.00 cm/s  MV E velocity: 92.50 cm/s  MV A velocity: 93.90 cm/s  SHUNTS  MV E/A ratio:  0.99        Systemic VTI:  0.16 m                             Systemic Diam: 1.70 cm   EEG 11/08/20 ABNORMALITY - Continuous slow, generalized   IMPRESSION: This study is suggestive of moderate diffuse  encephalopathy, nonspecific etiology. No seizures or epileptiform discharges were seen throughout the recording.   EGD Findings:      The esophagus was normal.      Evidence of an antrectomy was found in the gastric body. This was      characterized by edema.      A severe benign-appearing, intrinsic stenosis that was non-traversed was      found in the jejunum.      There was significant amount of bilious fluid in the remnant gastric      body and the fundus of the stomach. The fluid was evacuated and the NG      tube and feeding tube were noted to be coiled in the gastric lumen. Both      tips were not beyond the anastomosis. The gastrojejunostomy was      edematous, but it was patent and it was passed through with ease.      Preferentially the endoscope advanced into the afferent limb and this      was confirmed with finding the ampulla. The endoscope was retracted and      careful examination for the efferent lumen was performed. A few      centimeters distal to the gastrojejunostomy some thick mucus was      identified. This was suctioned and the endoscope was advanced to this      area. Immediately an obstruction was encountered. Several small pinpoint      orifices were identified and air bubbles were noted to be coming out      (Image 8). The findings were relayed to Dr. Zenia Resides. Using a rat-toothed      forcep the NG tube was grasped in the gastric lumen and it was advanced      distally into the afferent limb. The procedure was then concluded. Impression:               - Normal esophagus.                           - An antrectomy was found, characterized by edema.                           - Jejunal stenosis.                           - No specimens collected. Recommendation:           - Return patient to hospital ward for ongoing care.                           - NPO.                           - Continue present medications.                           - ?  Surgical  revision.  Including procedures, antibiotic start and stop dates in addition to other pertinent events  6/28 > admit, robotic distal gastrectomy, 7/5 > 6 minute code (asystole then PEA), severe hypoxemic respiratory failure and shock 7/5 levaquin x1 7/5 ceftriaxone > 7/6 7/5 doxycycline > 7/6 7/6 cefepime >  7/6 EEG > generalized slowing suggestive of severe diffuse encephalopathy non-specific in etiology 7/6 TTE > LVEF 65-70%, grade II diastolic dysfunction, RSVP elevated 58.1 mmHg, trivial MR 7/12 extubated 7/15 EGD done and showed Efferent Limb Obstruction 7/18 WBC elevated to 28 so will pan Cx and repeat CT Chest/Abd/Pelvis and start back on IV Ceftriaxone/Flagyl    Antimicrobials:  Anti-infectives (From admission, onward)    Start     Dose/Rate Route Frequency Ordered Stop   11/04/20 1245  cefTRIAXone (ROCEPHIN) 2 g in sodium chloride 0.9 % 100 mL IVPB        2 g 200 mL/hr over 30 Minutes Intravenous Every 24 hours 11/04/20 1148 11/06/20 1302   11/02/20 1000  cefTRIAXone (ROCEPHIN) 2 g in sodium chloride 0.9 % 100 mL IVPB  Status:  Discontinued        2 g 200 mL/hr over 30 Minutes Intravenous Every 24 hours 11/01/20 1257 11/02/20 0856   11/02/20 1000  doxycycline (VIBRAMYCIN) 100 mg in sodium chloride 0.9 % 250 mL IVPB  Status:  Discontinued        100 mg 125 mL/hr over 120 Minutes Intravenous Every 12 hours 11/01/20 1257 11/02/20 0856   11/02/20 1000  ceFEPIme (MAXIPIME) 2 g in sodium chloride 0.9 % 100 mL IVPB  Status:  Discontinued        2 g 200 mL/hr over 30 Minutes Intravenous Every 12 hours 11/02/20 0856 11/04/20 1148   11/01/20 0915  levofloxacin (LEVAQUIN) IVPB 750 mg  Status:  Discontinued        750 mg 100 mL/hr over 90 Minutes Intravenous Every 24 hours 11/01/20 0827 11/01/20 1257   11/01/20 0730  levofloxacin (LEVAQUIN) IVPB 500 mg  Status:  Discontinued        500 mg 100 mL/hr over 60 Minutes Intravenous Every 24 hours 11/01/20 0631 11/01/20 1257   10/07/2020  2100  ciprofloxacin (CIPRO) IVPB 400 mg        400 mg 200 mL/hr over 60 Minutes Intravenous Every 12 hours 10/19/2020 1258 10/13/2020 2148   10/23/2020 0600  ciprofloxacin (CIPRO) IVPB 400 mg        400 mg 200 mL/hr over 60 Minutes Intravenous On call to O.R. 10/01/2020 0547 10/24/2020 0847        Subjective: Seen and examined at bedside and was more tachypneic and remained on the BiPAP this morning.  Husband is not at bedside but patient denies any complaints and any abdominal pain.  WBC is trending up so we will panculture.  She has no other concerns or complaints at this time  Objective: Vitals:   11/14/20 0405 11/14/20 0500 11/14/20 0615 11/14/20 0700  BP: (!) 142/73  101/88 137/65  Pulse: 97  86   Resp: (!) 36  (!) 28   Temp: 98.8 F (37.1 C)  98.8 F (37.1 C) 98 F (36.7 C)  TempSrc: Oral  Axillary Oral  SpO2: 97%  97%   Weight:  98.8 kg    Height:        Intake/Output Summary (Last 24 hours) at 11/14/2020 1514 Last data filed at 11/14/2020 0500 Gross per 24 hour  Intake 1255.18 ml  Output 1425 ml  Net -169.82 ml    Filed Weights   11/17/2020 0500 11/12/20 0458 11/14/20 0500  Weight: 94.5 kg 95.2 kg 98.8 kg   Examination: Physical Exam:  Constitutional: WN/WD morbidly obese Caucasian female currently in mild distress appears a bit more tachypneic and uncomfortable.  She is wearing BiPAP Eyes: Lids and conjunctivae normal, sclerae anicteric  ENMT: External Ears, Nose appear normal.  The NG tube under the left nare and a small bore core track tube in the right and wearing a BiPAP facemask on top of this Neck: Appears normal, supple, no cervical masses, normal ROM, unable to assess JVD given her body habitus Respiratory: Diminished to auscultation bilaterally with coarse breath sounds and some mild crackles.  She is tachypneic wearing BiPAP.  Has slight accessory muscle usage Cardiovascular: Slightly tachycardic rate, no murmurs / rubs / gallops. S1 and S2 auscultated.  1+  extremity edema.  Abdomen: Soft, non-tender with 4 incisions noted, distended secondary body habitus.  Bowel sounds positive.  GU: Deferred. Musculoskeletal: No clubbing / cyanosis of digits/nails. No joint deformity upper and lower extremities.  Skin: No rashes, lesions, ulcers on limited skin evaluation. No induration; Warm and dry.  Neurologic: CN 2-12 grossly intact with no focal deficits. Romberg sign and cerebellar reflexes not assessed.  Psychiatric: Normal judgment and insight. Alert and oriented x 3. Normal mood and appropriate affect.   Data Reviewed: I have personally reviewed following labs and imaging studies  CBC: Recent Labs  Lab 11/10/20 0256 11/03/2020 0022 11/12/20 0242 11/13/20 0025 11/14/20 0634  WBC 11.5* 12.5* 19.6* 19.6* 28.7*  NEUTROABS  --  10.6* 17.7* 17.3* 24.3*  HGB 8.1* 8.5* 8.7* 8.2* 8.5*  HCT 28.0* 29.3* 30.0* 28.6* 30.0*  MCV 99.3 100.0 99.3 101.1* 102.0*  PLT 343 368 320 305 375    Basic Metabolic Panel: Recent Labs  Lab 11/10/20 0256 11/10/2020 0022 11/12/20 0242 11/13/20 0025 11/14/20 0634  NA 144 147* 147* 148* 147*  K 4.0 4.4 3.8 3.2* 4.1  CL 111 114* 116* 117* 120*  CO2 27 28 24 25  20*  GLUCOSE 132* 205* 190* 169* 201*  BUN 37* 34* 37* 43* 50*  CREATININE 1.08* 1.06* 1.05* 1.02* 1.12*  CALCIUM 9.1 8.7* 8.3* 8.1* 8.1*  MG 2.1 2.2 2.2 2.3 2.3  PHOS 3.3 3.0 3.0 3.3 3.2    GFR: Estimated Creatinine Clearance: 43.4 mL/min (A) (by C-G formula based on SCr of 1.12 mg/dL (H)). Liver Function Tests: Recent Labs  Lab 11/09/20 1435 11/17/2020 0022 11/12/20 0242 11/13/20 0025 11/14/20 0634  AST 38 31 22 19 26   ALT 28 25 23 18 21   ALKPHOS 86 75 63 67 74  BILITOT 2.0* 2.1* 1.9* 2.1* 2.1*  PROT 6.5 6.3* 5.9* 5.7* 5.7*  ALBUMIN 2.2* 2.1* 1.9* 1.7* 1.5*    No results for input(s): LIPASE, AMYLASE in the last 168 hours. No results for input(s): AMMONIA in the last 168 hours.  Coagulation Profile: No results for input(s): INR, PROTIME  in the last 168 hours. Cardiac Enzymes: No results for input(s): CKTOTAL, CKMB, CKMBINDEX, TROPONINI in the last 168 hours. BNP (last 3 results) No results for input(s): PROBNP in the last 8760 hours. HbA1C: No results for input(s): HGBA1C in the last 72 hours. CBG: Recent Labs  Lab 11/13/20 1938 11/13/20 2340 11/14/20 0401 11/14/20 0717 11/14/20 1149  GLUCAP 196* 182* 175* 189* 230*    Lipid Profile: Recent Labs    11/14/20 0634  TRIG 133    Thyroid Function Tests: No  results for input(s): TSH, T4TOTAL, FREET4, T3FREE, THYROIDAB in the last 72 hours. Anemia Panel: No results for input(s): VITAMINB12, FOLATE, FERRITIN, TIBC, IRON, RETICCTPCT in the last 72 hours. Sepsis Labs: No results for input(s): PROCALCITON, LATICACIDVEN in the last 168 hours.  No results found for this or any previous visit (from the past 240 hour(s)).   RN Pressure Injury Documentation: Pressure Injury 11/03/20 Sacrum Mid Stage 2 -  Partial thickness loss of dermis presenting as a shallow open injury with a red, pink wound bed without slough. (Active)  11/03/20 1000  Location: Sacrum  Location Orientation: Mid  Staging: Stage 2 -  Partial thickness loss of dermis presenting as a shallow open injury with a red, pink wound bed without slough.  Wound Description (Comments):   Present on Admission:     Estimated body mass index is 43.99 kg/m as calculated from the following:   Height as of this encounter: 4' 11"  (1.499 m).   Weight as of this encounter: 98.8 kg.  Malnutrition Type:  Nutrition Problem: Increased nutrient needs Etiology: post-op healing  Malnutrition Characteristics:  Signs/Symptoms: estimated needs  Nutrition Interventions:  Interventions: Tube feeding   Radiology Studies: DG CHEST PORT 1 VIEW  Result Date: 11/14/2020 CLINICAL DATA:  Leukocytosis, history CHF, hypertension, stroke, on BiPAP EXAM: PORTABLE CHEST 1 VIEW COMPARISON:  Portable exam 0915 hours compared to  11/18/2020 FINDINGS: Nasogastric tube and feeding tube extends into stomach. Enlargement of cardiac silhouette with pulmonary vascular congestion. Atherosclerotic calcification aorta. Mild pulmonary infiltrates likely reflecting pulmonary edema. Subsegmental atelectasis RIGHT upper lobe. No pleural effusion or pneumothorax. Bones demineralized with evidence of prior cervical spine fusion. IMPRESSION: Enlargement of cardiac silhouette with pulmonary vascular congestion and minimal pulmonary edema. Subsegmental atelectasis RIGHT upper lobe unchanged. Aortic Atherosclerosis (ICD10-I70.0). Electronically Signed   By: Lavonia Dana M.D.   On: 11/14/2020 11:21   [place] EKG SITE RITE  Result Date: 11/13/2020 If Site Rite image not attached, placement could not be confirmed due to current cardiac rhythm.   Scheduled Meds:  albuterol  2.5 mg Nebulization Once   bisacodyl  10 mg Rectal Daily   chlorhexidine gluconate (MEDLINE KIT)  15 mL Mouth Rinse BID   Chlorhexidine Gluconate Cloth  6 each Topical Daily   enoxaparin (LOVENOX) injection  40 mg Subcutaneous Q24H   insulin aspart  0-20 Units Subcutaneous Q4H   levothyroxine  150 mcg Per Tube Q0600   mouth rinse  15 mL Mouth Rinse BID   mouth rinse  15 mL Mouth Rinse 10 times per day   metoCLOPramide (REGLAN) injection  10 mg Intravenous Q8H   pantoprazole (PROTONIX) IV  40 mg Intravenous Daily   sodium chloride flush  10-40 mL Intracatheter Q12H   Continuous Infusions:  sodium chloride 400 mL/hr at 11/13/20 1840   sodium chloride Stopped (11/13/20 1806)   levETIRAcetam 250 mg (11/14/20 0853)   TPN ADULT (ION) 75 mL/hr at 11/13/20 1840   TPN ADULT (ION)      LOS: 20 days   Kerney Elbe, DO [facility] PAGER is on AMION  If 7PM-7AM, please contact night-coverage [email or web address]

## 2020-11-14 NOTE — Progress Notes (Signed)
Pt taken off Bipap at this time after ABG, due to pt having to go to CT.  MD wants ABG 30 minutes after pt is off Bipap.  To see if pt could tolerate CT without wearing Bipap.

## 2020-11-14 NOTE — Progress Notes (Signed)
SLP Cancellation Note  Patient Details Name: [name] MRN: [id number] DOB: 18-Apr-1946   Cancelled treatment:        Per hospitalist note pt has [place] Anastamosis and she had an obstruction in the Efferent Limb of the anastomosis.  General surgery and GI both recommending TPN for now. Will continue to monitor.   Houston Siren 11/14/2020, 8:11 AM  Orbie Pyo Colvin Caroli.Ed Risk analyst [phone] Office [phone]

## 2020-11-14 NOTE — Progress Notes (Signed)
Patient placed on bipap for increased WOB. Neb treatment given through bipap. RN made aware. RT will monitor.

## 2020-11-14 NOTE — Progress Notes (Signed)
Per Dr Bobbye Morton, hold CT contrast d/t aspiration risk.

## 2020-11-14 NOTE — Progress Notes (Signed)
Inpatient Rehab Admissions Coordinator:   Pt. Currently requiring TPN and BiPap. She is not stable for transfer to CIR at this time, but  [facility] team will follow at a distance and pursue for potential admission once medically appropriate. Husband updated.   Clemens Catholic, [place], [place] Admissions Coordinator  [phone] ([place]) [phone] (office)

## 2020-11-14 NOTE — Progress Notes (Signed)
Occupational Therapy Treatment Patient Details Name: [name] MRN: [id number] DOB: [birth year] Today's Date: 11/14/2020    History of present illness Pt is a 75 y/o female admitted 6/28 for elective robotic distal gastrectomy due to intramucosal adenocarcinoma, complicated by 6 minute code (asystole then PEA) on 7/5.  EEG 7/6 with severe diffuse encephalopathy. 7/10 MRI negative.  7/12 extubated. PMH: obesity, hiatal hernia, CHF, CAD, h/o CVA.   OT comments  This 75 yo female admitted with above seen today with PT to see if we could progress pt's mobility OOB; however due to pt's high anxiety with movement (even though see agreed to get up) we were only limited to EOB (due to increased RR and HR despite Sats staying 90% or better on 2-4 liters of O2. She will continue to benefit from acute OT with follow up on CIR if her anxiety can be better controlled.  Follow Up Recommendations  CIR;Supervision/Assistance - 24 hour    Equipment Recommendations  3 in 1 bedside commode       Precautions / Restrictions Precautions Precautions: Fall Precaution Comments: cortrak, NG, pt on bipap upon arrival Restrictions Weight Bearing Restrictions: No       Mobility Bed Mobility Overal bed mobility: Needs Assistance Bed Mobility: Supine to Sit;Sit to Supine;Rolling Rolling: Min guard   Supine to sit: Mod assist;+2 for physical assistance Sit to supine: Mod assist;+2 for physical assistance   General bed mobility comments: modA to complete movements, pt reaching frantically for assist due to anxiety, needing calm redirection for placement of hands to assist with mobility. mod-maxA of 2 to complete transisions.                  Balance Overall balance assessment: Needs assistance Sitting-balance support: Feet supported;Single extremity supported;Bilateral upper extremity supported Sitting balance-[name]: Poor Sitting balance - Comments: reliant on BUE support with modA or  posterior support Postural control: Posterior lean                                 ADL either performed or assessed with clinical judgement   ADL Overall ADL's : Needs assistance/impaired       Grooming Details (indicate cue type and reason): min guard A sitting EOB to wash face, total A to comb hair                                     Vision Patient Visual Report: No change from baseline            Cognition Arousal/Alertness: Awake/alert Behavior During Therapy: Anxious Overall Cognitive Status: Impaired/Different from baseline Area of Impairment: Following commands;Safety/judgement;Awareness;Problem solving                   Current Attention Level: Focused Memory: Decreased short-term memory;Decreased recall of precautions Following Commands: Follows one step commands consistently;Follows one step commands with increased time;Follows multi-step commands inconsistently Safety/Judgement: Decreased awareness of safety;Decreased awareness of deficits Awareness: Emergent Problem Solving: Slow processing;Requires verbal cues General Comments: pt with significant anxiety in regards to SOB. needing max cues for slowed breathing, safety with sitting/positioning, and technique for movement. Pt needing redirection for anxiety through entire session                   Pertinent Vitals/ Pain       Pain Assessment:  No/denies pain         Frequency  Min 2X/week        Progress Toward Goals  OT Goals(current goals can now be found in the care plan section)  Progress towards OT goals: Not progressing toward goals - comment (due to anxiety with any movement, sitting EOB)  Acute Rehab OT Goals Patient Stated Goal: to be able to go home OT Goal Formulation: With patient Time For Goal Achievement: 11/22/20 Potential to Achieve Goals: Good  Plan Discharge plan remains appropriate;Frequency remains appropriate    Co-evaluation     PT/OT/SLP Co-Evaluation/Treatment: Yes Reason for Co-Treatment: Necessary to address cognition/behavior during functional activity;For patient/therapist safety;To address functional/ADL transfers PT goals addressed during session: Mobility/safety with mobility;Balance;Strengthening/ROM OT goals addressed during session: Strengthening/ROM;ADL's and self-care      AM-PAC OT "6 Clicks" Daily Activity     Outcome Measure   Help from another person eating meals?: A Lot Help from another person taking care of personal grooming?: A Lot Help from another person toileting, which includes using toliet, bedpan, or urinal?: Total Help from another person bathing (including washing, rinsing, drying)?: A Lot Help from another person to put on and taking off regular upper body clothing?: Total Help from another person to put on and taking off regular lower body clothing?: Total 6 Click Score: 9    End of Session Equipment Utilized During Treatment: Oxygen (2-4 liters during session due to anxiety and increased work of breathing)  OT Visit Diagnosis: Other abnormalities of gait and mobility (R26.89);Muscle weakness (generalized) (M62.81);Other symptoms and signs involving cognitive function   Activity Tolerance  (limited by anxiety causing HR and RR to be high)   Patient Left in bed;with call bell/phone within reach;with bed alarm set   Nurse Communication  (Pt's vitals)        Time: 0737-1062 OT Time Calculation (min): 31 min  Charges: OT General Charges $OT Visit: 1 Visit OT Treatments $Self Care/Home Management : 8-22 mins  [name], OTR/L Acute [employer] Pager [phone] Office [phone]     Almon Register 11/14/2020, 4:50 PM

## 2020-11-14 NOTE — Progress Notes (Signed)
Nutrition Follow-up  DOCUMENTATION CODES:   Not applicable  INTERVENTION:   - Recommend considering options for obtaining jejunal enteral access (i.e. small-bore feeding tube that extends past efferent limb obstruction vs surgical J-tube placement)  - Continue TPN dosing per Pharmacy to meet 100% of pt's estimated needs  NUTRITION DIAGNOSIS:   Increased nutrient needs related to post-op healing as evidenced by estimated needs.  Ongoing, being addressed via TPN  GOAL:   Patient will meet greater than or equal to 90% of their needs  Met via TPN  MONITOR:   Diet advancement, Labs, Weight trends, Skin, I & O's, Other (TPN, ability to restart TF)  REASON FOR ASSESSMENT:   Ventilator    ASSESSMENT:   Patient with PMH significant for OSA, HTN, CAD, seizures, TIA, DM, CKD, HLD, and recently found HGD polyp of gastric antrum with focal intramucosal adenocarcinoma. Presents this admission for elective robotic distal gastrectomy.  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  Met with pt at bedside. Pt working with therapies at time of RD visit but appeared uncomfortable as pt had both an NG tube in place to suction as well as a Cortrak tube in place (clamped). Discussed with MD. RN to remove Cortrak tube today.  Per RN, plan is for pt to return to OR later this week. Pt currently  meeting 100% of her needs via TPN and unable to feed enterally due to efferent limb obstruction. Recommend considering options for obtaining jejunal enteral access (i.e. small-bore feeding tube that extends past efferent limb obstruction vs surgical J-tube placement).  NG tube remains in place in pt's left nare, currently to low intermittent suction.  Admission weight: 102.1 kg Current weight: 98.8 kg  Pt with a total weight loss of 3.3 kg since 7/05. This is a 3.2% weight loss in 2 weeks which is significant for timeframe. Pt is at risk for acute malnutrition.  Pt with mild pitting generalized edema per nursing documentation. Pt is net positive 1.2 L since admission per I/O's.  Medications reviewed and include: dulcolax, SSI q 4 hours, IV reglan, IV protonix, TPN  Labs reviewed: sodium 147, BUN 50, creatinine 1.12, hemoglobin 8.5 CBG's: 175-230 x 24 hours  UOP: 1175 ml x 24 hours NGT output: 250 ml x 12 hours I/O's: +1.2 L since admit  Diet Order:   Diet Order             Diet NPO time specified  Diet effective now                   EDUCATION NEEDS:   No education needs have been identified at this time  Skin:  Skin Assessment:  Skin Integrity Issues: Stage II: sacrum Incisions: abdomen  Last BM:  11/13/20 medium type 6  Height:   Ht Readings from Last 1 Encounters:  09/28/2020 4' 11" (1.499 m)    Weight:   Wt Readings from Last  1 Encounters:  11/14/20 98.8 kg    BMI:  Body mass index is 43.99 kg/m.  Estimated Nutritional Needs:   Kcal:  1800-2000 kcal  Protein:  100-120 grams  Fluid:  >/= 1.8 L/day    Gustavus Bryant, MS, RD, LDN Inpatient Clinical Dietitian Please see AMiON for contact information.

## 2020-11-14 NOTE — Progress Notes (Signed)
Patient ID: [name], female   DOB: November 06, 1945, 75 y.o.   MRN: [id number] [facility] Progress Note:     Subjective: Pt still on bipap, but alert and answering questions appropriately.  Denies pain.    Objective: Vital signs in last 24 hours: Temp:  [97.7 F (36.5 C)-98.8 F (37.1 C)] 98 F (36.7 C) (07/18 0700) Pulse Rate:  [86-130] 130 (07/18 1617) Resp:  [21-40] 40 (07/18 1617) BP: (95-145)/(65-93) 137/65 (07/18 0700) SpO2:  [93 %-100 %] 96 % (07/18 1617) Weight:  [98.8 kg] 98.8 kg (07/18 0500)  Intake/Output from previous day: 07/17 0701 - 07/18 0700 In: 2974.8 [I.V.:2612.9; NG/GT:50; IV Piggyback:311.9] Out: 8502 [Urine:1175; Emesis/NG output:250] Intake/Output this shift: No intake/output data recorded.  Physical Exam: Work of breathing is not labored, though slightly tachypneic.   CV regular, but was tachycardic yesterday.   Abd soft, non tender, non distended.     Lab Results:  Results for orders placed or performed during the hospital encounter of 10/26/2020 (from the past 48 hour(s))  Glucose, capillary     Status: Abnormal   Collection Time: 11/12/20  8:30 PM  Result Value Ref Range   Glucose-Capillary 205 (H) 70 - 99 mg/dL    Comment: Glucose reference range applies only to samples taken after fasting for at least 8 hours.  Glucose, capillary     Status: Abnormal   Collection Time: 11/12/20 11:55 PM  Result Value Ref Range   Glucose-Capillary 161 (H) 70 - 99 mg/dL    Comment: Glucose reference range applies only to samples taken after fasting for at least 8 hours.  Comprehensive metabolic panel     Status: Abnormal   Collection Time: 11/13/20 12:25 AM  Result Value Ref Range   Sodium 148 (H) 135 - 145 mmol/L   Potassium 3.2 (L) 3.5 - 5.1 mmol/L   Chloride 117 (H) 98 - 111 mmol/L   CO2 25 22 - 32 mmol/L   Glucose, Bld 169 (H) 70 - 99 mg/dL    Comment: Glucose reference range applies only to samples taken after fasting for at least 8  hours.   BUN 43 (H) 8 - 23 mg/dL   Creatinine, Ser 1.02 (H) 0.44 - 1.00 mg/dL   Calcium 8.1 (L) 8.9 - 10.3 mg/dL   Total Protein 5.7 (L) 6.5 - 8.1 g/dL   Albumin 1.7 (L) 3.5 - 5.0 g/dL   AST 19 15 - 41 U/L   ALT 18 0 - 44 U/L   Alkaline Phosphatase 67 38 - 126 U/L   Total Bilirubin 2.1 (H) 0.3 - 1.2 mg/dL   GFR, Estimated 57 (L) >60 mL/min    Comment: (NOTE) Calculated using the CKD-EPI Creatinine Equation (2021)    Anion gap 6 5 - 15    Comment: Performed at [facility], [place] [address] [place], [place] [address]  Magnesium     Status: None   Collection Time: 11/13/20 12:25 AM  Result Value Ref Range   Magnesium 2.3 1.7 - 2.4 mg/dL    Comment: Performed at [facility], [place] [address], [place], [place] [address]  Phosphorus     Status: None   Collection Time: 11/13/20 12:25 AM  Result Value Ref Range   Phosphorus 3.3 2.5 - 4.6 mg/dL    Comment: Performed at [facility] [address] [place], [place] [address]  CBC with Differential/Platelet     Status: Abnormal   Collection Time: 11/13/20 12:25 AM  Result Value Ref Range   WBC 19.6 (H) 4.0 - 10.5 K/uL   RBC 2.83 (L) 3.87 - 5.11 MIL/uL   Hemoglobin 8.2 (L) 12.0 - 15.0 g/dL   HCT 28.6 (L) 36.0 - 46.0 %   MCV 101.1 (H) 80.0 - 100.0 fL   MCH 29.0 26.0 - 34.0 pg   MCHC 28.7 (L) 30.0 - 36.0 g/dL   RDW 16.9 (H) 11.5 - 15.5 %   Platelets 305 150 - 400 K/uL   nRBC 0.0 0.0 - 0.2 %   Neutrophils Relative % 88 %   Neutro Abs 17.3 (H) 1.7 - 7.7 K/uL   Lymphocytes Relative 5 %   Lymphs Abs 0.9 0.7 - 4.0 K/uL   Monocytes Relative 5 %   Monocytes Absolute 1.0 0.1 - 1.0 K/uL   Eosinophils Relative 0 %   Eosinophils Absolute 0.0 0.0 - 0.5 K/uL   Basophils Relative 0 %   Basophils Absolute 0.0 0.0 - 0.1 K/uL   Immature Granulocytes 2 %   Abs Immature Granulocytes 0.37 (H) 0.00 - 0.07 K/uL    Comment: Performed at [facility], [address], [place], [place] [address]  Glucose, capillary     Status:  Abnormal   Collection Time: 11/13/20  4:13 AM  Result Value Ref Range   Glucose-Capillary 184 (H) 70 - 99 mg/dL    Comment: Glucose reference range applies only to samples taken after fasting for at least 8 hours.  Glucose, capillary     Status: Abnormal   Collection Time: 11/13/20  8:24 AM  Result Value Ref Range   Glucose-Capillary 219 (H) 70 - 99 mg/dL    Comment: Glucose reference range applies only to samples taken after fasting for at least 8 hours.  Glucose, capillary     Status: Abnormal   Collection Time: 11/13/20 11:44 AM  Result Value Ref Range   Glucose-Capillary 190 (H) 70 - 99 mg/dL    Comment: Glucose reference range applies only to samples taken after fasting for at least 8 hours.   Comment 1 Notify RN    Comment 2 Document in Chart   Glucose, capillary     Status: Abnormal   Collection Time: 11/13/20  4:52 PM  Result Value Ref Range   Glucose-Capillary 196 (H) 70 - 99 mg/dL    Comment: Glucose reference range applies only to samples taken after fasting for at least 8 hours.   Comment 1 Notify RN    Comment 2 Document in Chart   Glucose, capillary     Status: Abnormal   Collection Time: 11/13/20  7:38 PM  Result Value Ref Range   Glucose-Capillary 196 (H) 70 - 99 mg/dL    Comment: Glucose reference range applies only to samples taken after fasting for at least 8 hours.  Glucose, capillary     Status: Abnormal   Collection Time: 11/13/20 11:40 PM  Result Value Ref Range   Glucose-Capillary 182 (H) 70 - 99 mg/dL    Comment: Glucose reference range applies only to samples taken after fasting for at least 8 hours.  Glucose, capillary     Status: Abnormal   Collection Time: 11/14/20  4:01 AM  Result Value Ref Range   Glucose-Capillary 175 (H) 70 - 99 mg/dL    Comment: Glucose reference range applies only to samples taken after fasting for at least 8 hours.  Comprehensive metabolic panel     Status: Abnormal   Collection Time: 11/14/20  6:34 AM  Result Value Ref  Range   Sodium 147 (H) 135 - 145 mmol/L   Potassium 4.1 3.5 - 5.1 mmol/L    Comment: SLIGHT HEMOLYSIS   Chloride 120 (H) 98 - 111 mmol/L   CO2 20 (L) 22 - 32 mmol/L   Glucose, Bld 201 (H) 70 - 99 mg/dL    Comment: Glucose reference range applies only to samples taken after fasting for at least 8 hours.   BUN 50 (H) 8 - 23 mg/dL   Creatinine, Ser 1.12 (H) 0.44 - 1.00 mg/dL   Calcium 8.1 (L) 8.9 - 10.3 mg/dL   Total Protein 5.7 (L) 6.5 - 8.1 g/dL   Albumin 1.5 (L) 3.5 - 5.0 g/dL   AST 26 15 - 41 U/L   ALT 21 0 - 44 U/L   Alkaline Phosphatase 74 38 - 126 U/L   Total Bilirubin 2.1 (H) 0.3 - 1.2 mg/dL   GFR, Estimated 51 (L) >60 mL/min    Comment: (NOTE) Calculated using the CKD-EPI Creatinine Equation (2021)    Anion gap 7 5 - 15    Comment: Performed at [facility], [place] [address] [place], [place] [address]  Magnesium     Status: None   Collection Time: 11/14/20  6:34 AM  Result Value Ref Range   Magnesium 2.3 1.7 - 2.4 mg/dL    Comment: Performed at [facility] [address] [place], [place] [address]  Phosphorus     Status: None   Collection Time: 11/14/20  6:34 AM  Result Value Ref Range   Phosphorus 3.2 2.5 - 4.6 mg/dL    Comment: Performed at [facility] [address], [place], [place] [address]  Triglycerides     Status: None   Collection Time: 11/14/20  6:34 AM  Result Value Ref Range   Triglycerides 133 <150 mg/dL    Comment: Performed at [facility] [address], [place], [place] [address]  Prealbumin     Status: Abnormal   Collection Time: 11/14/20  6:34 AM  Result Value Ref Range   Prealbumin 7.3 (L) 18 - 38 mg/dL    Comment: Performed at [facility] [address] [place],  [address]  CBC with Differential/Platelet     Status: Abnormal   Collection Time: 11/14/20  6:34 AM  Result Value Ref Range   WBC 28.7 (H) 4.0 - 10.5 K/uL   RBC 2.94 (L) 3.87 - 5.11 MIL/uL   Hemoglobin 8.5 (L) 12.0 - 15.0 g/dL   HCT 30.0  (L) 36.0 - 46.0 %   MCV 102.0 (H) 80.0 - 100.0 fL   MCH 28.9 26.0 - 34.0 pg   MCHC 28.3 (L) 30.0 - 36.0 g/dL   RDW 17.0 (H) 11.5 - 15.5 %   Platelets 271 150 - 400 K/uL   nRBC 0.0 0.0 - 0.2 %   Neutrophils Relative % 84 %   Neutro Abs 24.3 (H) 1.7 - 7.7 K/uL   Lymphocytes Relative 5 %   Lymphs Abs 1.4 0.7 - 4.0 K/uL   Monocytes Relative 7 %   Monocytes Absolute 2.0 (H) 0.1 - 1.0 K/uL   Eosinophils Relative 0 %   Eosinophils Absolute 0.0 0.0 - 0.5 K/uL   Basophils Relative 0 %   Basophils Absolute 0.0 0.0 - 0.1 K/uL   Immature Granulocytes 4 %   Abs Immature Granulocytes 1.02 (H) 0.00 - 0.07 K/uL    Comment: Performed at [facility], [place] [address] [place], [place] [address]  Glucose, capillary     Status: Abnormal   Collection Time: 11/14/20  7:17 AM  Result Value Ref Range   Glucose-Capillary 189 (H) 70 - 99 mg/dL    Comment: Glucose reference range applies only to samples taken after fasting for at least 8 hours.   Comment 1 Notify RN    Comment 2 Document in Chart   Urinalysis, Routine w reflex microscopic Urine, Clean Catch     Status: Abnormal   Collection Time: 11/14/20  9:06 AM  Result Value Ref Range   Color, Urine AMBER (A) YELLOW    Comment: BIOCHEMICALS MAY BE AFFECTED BY COLOR   APPearance CLOUDY (A) CLEAR   Specific Gravity, Urine 1.018 1.005 - 1.030   pH 6.0 5.0 - 8.0   Glucose, UA 150 (A) NEGATIVE mg/dL   Hgb urine dipstick SMALL (A) NEGATIVE   Bilirubin Urine NEGATIVE NEGATIVE   Ketones, ur NEGATIVE NEGATIVE mg/dL   Protein, ur 30 (A) NEGATIVE mg/dL   Nitrite NEGATIVE NEGATIVE   Leukocytes,Ua LARGE (A) NEGATIVE   RBC / HPF 21-50 0 - 5 RBC/hpf   WBC, UA 11-20 0 - 5 WBC/hpf   Bacteria, UA RARE (A) NONE SEEN   Squamous Epithelial / LPF 6-10 0 - 5   Budding Yeast PRESENT     Comment: Performed at [facility], [address], [place], [place] [address]  Glucose, capillary     Status: Abnormal   Collection Time: 11/14/20 11:49 AM  Result  Value Ref Range   Glucose-Capillary 230 (H) 70 - 99 mg/dL    Comment: Glucose reference range applies only to samples taken after fasting for at least 8 hours.  Glucose, capillary     Status: Abnormal   Collection Time: 11/14/20  4:21 PM  Result Value Ref Range   Glucose-Capillary 175 (H) 70 - 99 mg/dL    Comment: Glucose reference range applies only to samples taken after fasting for at least 8 hours.    Radiology/Results: DG CHEST PORT 1 VIEW  Result Date: 11/14/2020 CLINICAL DATA:  Leukocytosis, history CHF, hypertension, stroke, on BiPAP EXAM: PORTABLE CHEST 1 VIEW COMPARISON:  Portable exam 0915 hours compared to 11/05/2020 FINDINGS: Nasogastric tube and feeding tube extends into stomach. Enlargement of cardiac silhouette with pulmonary vascular congestion. Atherosclerotic calcification aorta. Mild pulmonary infiltrates likely reflecting pulmonary edema. Subsegmental atelectasis RIGHT upper lobe. No pleural effusion or pneumothorax. Bones demineralized with evidence of prior cervical spine fusion. IMPRESSION: Enlargement of cardiac silhouette with pulmonary vascular congestion and minimal pulmonary edema. Subsegmental atelectasis RIGHT upper lobe unchanged. Aortic Atherosclerosis (ICD10-I70.0). Electronically Signed   By: Lavonia Dana M.D.   On: 11/14/2020 11:21   [place] EKG SITE RITE  Result Date: 11/13/2020 If Site Rite image not attached, placement could not be confirmed due to current cardiac rhythm.   Anti-infectives: Anti-infectives (From admission, onward)    Start     Dose/Rate Route Frequency Ordered Stop   11/14/20 1615  cefTRIAXone (ROCEPHIN) 2 g in sodium chloride 0.9 % 100 mL IVPB        2 g 200 mL/hr over 30 Minutes Intravenous Every 24 hours 11/14/20 1521     11/14/20 1615  metroNIDAZOLE (FLAGYL) IVPB 500 mg        500 mg 100 mL/hr over 60 Minutes Intravenous Every 8 hours 11/14/20 1521     11/04/20 1245  cefTRIAXone (ROCEPHIN) 2 g in sodium chloride 0.9 % 100  mL IVPB         2 g 200 mL/hr over 30 Minutes Intravenous Every 24 hours 11/04/20 1148 11/06/20 1302   11/02/20 1000  cefTRIAXone (ROCEPHIN) 2 g in sodium chloride 0.9 % 100 mL IVPB  Status:  Discontinued        2 g 200 mL/hr over 30 Minutes Intravenous Every 24 hours 11/01/20 1257 11/02/20 0856   11/02/20 1000  doxycycline (VIBRAMYCIN) 100 mg in sodium chloride 0.9 % 250 mL IVPB  Status:  Discontinued        100 mg 125 mL/hr over 120 Minutes Intravenous Every 12 hours 11/01/20 1257 11/02/20 0856   11/02/20 1000  ceFEPIme (MAXIPIME) 2 g in sodium chloride 0.9 % 100 mL IVPB  Status:  Discontinued        2 g 200 mL/hr over 30 Minutes Intravenous Every 12 hours 11/02/20 0856 11/04/20 1148   11/01/20 0915  levofloxacin (LEVAQUIN) IVPB 750 mg  Status:  Discontinued        750 mg 100 mL/hr over 90 Minutes Intravenous Every 24 hours 11/01/20 0827 11/01/20 1257   11/01/20 0730  levofloxacin (LEVAQUIN) IVPB 500 mg  Status:  Discontinued        500 mg 100 mL/hr over 60 Minutes Intravenous Every 24 hours 11/01/20 0631 11/01/20 1257   09/28/2020 2100  ciprofloxacin (CIPRO) IVPB 400 mg        400 mg 200 mL/hr over 60 Minutes Intravenous Every 12 hours 10/08/2020 1258 10/21/2020 2148   10/21/2020 0600  ciprofloxacin (CIPRO) IVPB 400 mg        400 mg 200 mL/hr over 60 Minutes Intravenous On call to O.R. 10/05/2020 0547 10/11/2020 0847       Assessment/Plan: Problem List: Patient Active Problem List   Diagnosis Date Noted   Pressure injury of skin 11/04/2020   Cardiac arrest ([facility])    Emesis    Respiratory failure ([facility])    SOB (shortness of breath)    Acute respiratory failure with hypoxemia ([place])    Encounter for central line placement    S/P partial gastrectomy 10/18/2020   Type 2 diabetes mellitus without complication ([place]) 95/63/8756   Hyperlipidemia 03/01/2014   Focal seizures ([facility]) 10/21/2013   TIA (transient ischemic attack) 10/21/2013   CHEST PAIN 01/24/2010   BRADYCARDIA 12/23/2009   Diabetes ([place])  08/04/2008   HYPOTHYROIDISM 03/06/2007   OBESITY 03/06/2007   OBSTRUCTIVE SLEEP APNEA 03/06/2007   HYPERTENSION, BENIGN 03/06/2007   CORONARY ARTERY DISEASE 03/06/2007   ALLERGIC RHINITIS 03/06/2007   DEGENERATIVE JOINT DISEASE, GENERALIZED 03/06/2007   BACK PAIN, CHRONIC 03/06/2007    For PICC line change.  Efferent limb obstruction.   Will likely need to be revised, but patient currently too sick for this. Started TNA.    3 Days Post-Op    LOS: 20 days   Milus Height, MD FACS Surgical Oncology, General Surgery, Trauma and [place] Surgery, [place] for weekday/non holidays Check [email or web address] for coverage night/weekend/holidays  Do not use SecureChat as it is not reliable for timely patient care.     11/14/2020 4:23 PM

## 2020-11-15 ENCOUNTER — Inpatient Hospital Stay (HOSPITAL_COMMUNITY): Payer: Medicare PPO

## 2020-11-15 DIAGNOSIS — E43 Unspecified severe protein-calorie malnutrition: Secondary | ICD-10-CM | POA: Diagnosis not present

## 2020-11-15 DIAGNOSIS — J690 Pneumonitis due to inhalation of food and vomit: Secondary | ICD-10-CM

## 2020-11-15 DIAGNOSIS — J9601 Acute respiratory failure with hypoxia: Secondary | ICD-10-CM | POA: Diagnosis not present

## 2020-11-15 DIAGNOSIS — I469 Cardiac arrest, cause unspecified: Secondary | ICD-10-CM | POA: Diagnosis not present

## 2020-11-15 LAB — CBC WITH DIFFERENTIAL/PLATELET
Abs Immature Granulocytes: 0.88 10*3/uL — ABNORMAL HIGH (ref 0.00–0.07)
Basophils Absolute: 0 10*3/uL (ref 0.0–0.1)
Basophils Relative: 0 %
Eosinophils Absolute: 0 10*3/uL (ref 0.0–0.5)
Eosinophils Relative: 0 %
HCT: 26.4 % — ABNORMAL LOW (ref 36.0–46.0)
Hemoglobin: 7.8 g/dL — ABNORMAL LOW (ref 12.0–15.0)
Immature Granulocytes: 4 %
Lymphocytes Relative: 5 %
Lymphs Abs: 1.3 10*3/uL (ref 0.7–4.0)
MCH: 32.8 pg (ref 26.0–34.0)
MCHC: 29.5 g/dL — ABNORMAL LOW (ref 30.0–36.0)
MCV: 110.9 fL — ABNORMAL HIGH (ref 80.0–100.0)
Monocytes Absolute: 1.5 10*3/uL — ABNORMAL HIGH (ref 0.1–1.0)
Monocytes Relative: 6 %
Neutro Abs: 20.8 10*3/uL — ABNORMAL HIGH (ref 1.7–7.7)
Neutrophils Relative %: 85 %
Platelets: 232 10*3/uL (ref 150–400)
RBC: 2.38 MIL/uL — ABNORMAL LOW (ref 3.87–5.11)
RDW: 18.3 % — ABNORMAL HIGH (ref 11.5–15.5)
WBC: 24.6 10*3/uL — ABNORMAL HIGH (ref 4.0–10.5)
nRBC: 0 % (ref 0.0–0.2)

## 2020-11-15 LAB — GLUCOSE, CAPILLARY
Glucose-Capillary: 151 mg/dL — ABNORMAL HIGH (ref 70–99)
Glucose-Capillary: 203 mg/dL — ABNORMAL HIGH (ref 70–99)
Glucose-Capillary: 201 mg/dL — ABNORMAL HIGH (ref 70–99)
Glucose-Capillary: 184 mg/dL — ABNORMAL HIGH (ref 70–99)
Glucose-Capillary: 155 mg/dL — ABNORMAL HIGH (ref 70–99)
Glucose-Capillary: 161 mg/dL — ABNORMAL HIGH (ref 70–99)

## 2020-11-15 LAB — COMPREHENSIVE METABOLIC PANEL
ALT: 21 U/L (ref 0–44)
AST: 26 U/L (ref 15–41)
Albumin: 1.6 g/dL — ABNORMAL LOW (ref 3.5–5.0)
Alkaline Phosphatase: 70 U/L (ref 38–126)
Anion gap: 7 (ref 5–15)
BUN: 65 mg/dL — ABNORMAL HIGH (ref 8–23)
CO2: 20 mmol/L — ABNORMAL LOW (ref 22–32)
Calcium: 8.5 mg/dL — ABNORMAL LOW (ref 8.9–10.3)
Chloride: 118 mmol/L — ABNORMAL HIGH (ref 98–111)
Creatinine, Ser: 1.71 mg/dL — ABNORMAL HIGH (ref 0.44–1.00)
GFR, Estimated: 30 mL/min — ABNORMAL LOW (ref >60)
Glucose, Bld: 190 mg/dL — ABNORMAL HIGH (ref 70–99)
Potassium: 4.2 mmol/L (ref 3.5–5.1)
Sodium: 145 mmol/L (ref 135–145)
Total Bilirubin: 2 mg/dL — ABNORMAL HIGH (ref 0.3–1.2)
Total Protein: 5.9 g/dL — ABNORMAL LOW (ref 6.5–8.1)

## 2020-11-15 LAB — PROCALCITONIN: Procalcitonin: 3.39 ng/mL

## 2020-11-15 LAB — MAGNESIUM: Magnesium: 2.3 mg/dL (ref 1.7–2.4)

## 2020-11-15 LAB — MRSA NEXT GEN BY PCR, NASAL: MRSA by PCR Next Gen: NOT DETECTED

## 2020-11-15 LAB — PHOSPHORUS: Phosphorus: 4 mg/dL (ref 2.5–4.6)

## 2020-11-15 MED ORDER — INSULIN ASPART 100 UNIT/ML IJ SOLN
3.0000 [IU] | INTRAMUSCULAR | Status: DC
  Administered 2020-11-15 – 2020-11-16 (×6): 3 [IU] via SUBCUTANEOUS

## 2020-11-15 MED ORDER — ALBUMIN HUMAN 25 % IV SOLN
25.0000 g | Freq: Four times a day (QID) | INTRAVENOUS | Status: AC
Start: 2020-11-15 — End: 2020-11-17
  Administered 2020-11-15 – 2020-11-17 (×7): 25 g via INTRAVENOUS
  Filled 2020-11-15 (×7): qty 100

## 2020-11-15 MED ORDER — VANCOMYCIN HCL IN DEXTROSE 1-5 GM/200ML-% IV SOLN
1000.0000 mg | INTRAVENOUS | Status: DC
  Administered 2020-11-15: 1000 mg via INTRAVENOUS
  Filled 2020-11-15: qty 200

## 2020-11-15 MED ORDER — ENOXAPARIN SODIUM 30 MG/0.3ML IJ SOSY
30.0000 mg | PREFILLED_SYRINGE | INTRAMUSCULAR | Status: DC
  Administered 2020-11-16: 30 mg via SUBCUTANEOUS
  Filled 2020-11-15: qty 0.3

## 2020-11-15 MED ORDER — INSULIN REGULAR HUMAN 100 UNIT/ML IJ SOLN
INTRAMUSCULAR | Status: AC
  Filled 2020-11-15: qty 1080

## 2020-11-15 MED ORDER — SODIUM CHLORIDE 0.9 % IV SOLN
2.0000 g | INTRAVENOUS | Status: DC
  Administered 2020-11-15: 2 g via INTRAVENOUS

## 2020-11-15 NOTE — Progress Notes (Signed)
Inpatient Rehab Admissions Coordinator:   Pt. Currently on TPN with possible need for further surgical intervention. She is not stable enough for CIR at this time (would need to be on a soft diet at least). I will continue to follow for potential admit pending medical readiness, insurance auth, and bed availability.   Clemens Catholic, [place], [place] Admissions Coordinator  [phone] ([place]) [phone] (office)

## 2020-11-15 NOTE — Progress Notes (Signed)
SLP Cancellation Note  Patient Details Name: [name] MRN: [id number] DOB: 1945/05/15   Cancelled treatment:       Reason Eval/Treat Not Completed: Medical issues which prohibited therapy. Pt remains NPO on TPN per GI/surgical recommendations. Will f/u as medically appropriate.     Osie Bond., M.A. [facility] Pager [phone] Office [phone]  11/15/2020, 8:07 AM

## 2020-11-15 NOTE — Progress Notes (Signed)
Pharmacy Antibiotic Note  [name] is a 75 y.o. female admitted on 10/22/2020 with sepsis now post arrest and likely aspiration.  Pharmacy has been consulted for Vanc and Cefepime dosing.   Patient with AKI post brief arrest yesterday. Scr trended up to 1.7. No fevers, wbc up to 24.   Given AKI will change to traditional vancomycin dosing.    Plan: Vanc 1000 mg IV q24hr for now, low hesitancy for a random level  Cefepime 2 gms IV q24 hours Monitor renal function, clinical status, C&S and vanc levels as needed  Height: 4\' 11"  (149.9 cm) Weight: 98.8 kg (217 lb 13 oz) IBW/kg (Calculated) : 43.2  Temp (24hrs), Avg:98.2 F (36.8 C), Min:97 F (36.1 C), Max:98.7 F (37.1 C)  Recent Labs  Lab 11/05/2020 0022 11/12/20 0242 11/13/20 0025 11/14/20 0634 11/14/20 1943 11/15/20 0124 11/15/20 0545  WBC 12.5* 19.6* 19.6* 28.7*  --  24.6*  --   CREATININE 1.06* 1.05* 1.02* 1.12*  --   --  1.71*  LATICACIDVEN  --   --   --   --  1.3  --   --      Estimated Creatinine Clearance: 28.4 mL/min (A) (by C-G formula based on SCr of 1.71 mg/dL (H)).    Allergies  Allergen Reactions   Codeine Itching   Meperidine Hcl Itching   Morphine Itching   Nylon Other (See Comments)    Stitches cause infection   Penicillins Hives, Itching and Rash    Tolerated cefepime and ceftriaxone Has patient had a PCN reaction causing immediate rash, facial/tongue/throat swelling, SOB or lightheadedness with hypotension: Yes Has patient had a PCN reaction causing severe rash involving mucus membranes or skin necrosis: Yes Has patient had a PCN reaction that required hospitalization: No Has patient had a PCN reaction occurring within the last 10 years: No If all of the above answers are "NO", then may proceed with Cephalosporin use.     Antimicrobials this admission: Vanc 7/18 >>  Cefep 7/18 >>   Thank you for allowing pharmacy to be a part of this patient's care.  Erin Hearing PharmD.,  BCPS Clinical Pharmacist 11/15/2020 8:27 AM

## 2020-11-15 NOTE — Progress Notes (Signed)
CMP with persistently elevated chloride. Will con't D5w. Adding Q4h insulin with hold parameters to optimize BG.  Julian Hy, DO 11/15/20 8:18 AM [facility]

## 2020-11-15 NOTE — Progress Notes (Signed)
NAME:  [name], MRN:  [id number], DOB:  April 19, 1946, LOS: 21 ADMISSION DATE:  10/01/2020, CONSULTATION DATE:  7/5 REFERRING MD:  Barry Dienes, CHIEF COMPLAINT:  Cardiac arrest   History of Present Illness:  [name] is a 75 y.o. 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 She had procedure done 6/28.  Pathology was negative for malignancy but showed LGD with negative nodes.   Early AM 7/5, she had wheezing so received a breathing treatment.  Shortly thereafter, she was found with emesis all over bed and floor.  She had gurgling sounds and altered LOC.  She ended up having loss of pulses with initial rhythm asystole.  She received 1mg  epinephrine before PEA for which she received a 2nd dose of epinephrine.  She then had ROSC after 6 minutes.  She was intubated during the code.   She was transferred to [facility] ICU where PCCM was asked to assist with vent management.  Pertinent  Medical History  OSA DM2 Hypertension CAD Allergic rhinitis  Significant Hospital Events: Including procedures, antibiotic start and stop dates in addition to other pertinent events   6/28 admit, robotic distal gastrectomy,  7/5 Six minute code (asystole then PEA), severe hypoxemic respiratory failure and shock 7/5 levaquin x1 7/5 ceftriaxone > 7/6 7/5 doxycycline > 7/6 7/6 cefepime >  7/6 EEG > generalized slowing suggestive of severe diffuse encephalopathy non-specific in etiology  7/6 TTE > LVEF 65-70%, grade II diastolic dysfunction, RSVP elevated 58.1 mmHg, trivial MR 7/12 extubated 7/13 To PCU, [facility] 7/18 PCCM called back for respiratory distress, PEA arrest x 6 minutes, not requiring intubation after.  Interim History / Subjective:  She denies complaints. Has been off bipap tolerating well on [place] for several hours. Did not get planned chest CT overnight due  to aspiration risk.  Objective   Blood pressure (!) 141/62, pulse (!) 101, temperature 98.6 F (37 C), temperature source Axillary, resp. rate (!) 28, height 4\' 11"  (1.499 m), weight 98.8 kg, SpO2 100 %.    Vent Mode: BIPAP FiO2 (%):  [50 %] 50 % Set Rate:  [10 bmp] 10 bmp PEEP:  [5 cmH20] 5 cmH20   Intake/Output Summary (Last 24 hours) at 11/15/2020 0731 Last data filed at 11/15/2020 0701 Gross per 24 hour  Intake 3275.1 ml  Output 200 ml  Net 3075.1 ml    Filed Weights   11/18/2020 0500 11/12/20 0458 11/14/20 0500  Weight: 94.5 kg 95.2 kg 98.8 kg    Examination: General: ill appearing, frail elderly woman lying in bed in NAD HEENT: [place]/AT, eyes anicteric  Neuro: awake, alert, globally weak CV: S1S2, RRR PULM: tachypneic, no accessory muscle use, distant breath sounds no rhonchi GI: NG tube with dark output, soft, nontender Extremities: No significant peripheral edema, no clubbing or cyanosis Skin: No rashes, warm and dry.  Laparoscopy incisions covered with bandages  CMP pending WBC 24.6 H/H7.8/26.4, neutrophil predominant CXR personally reviewed-low lung volumes, left-sided retrocardiac opacity with air bronchograms  Resolved Hospital Problem list   Septic shock secondary to aspiration PNA Cardiac Arrest secondary to Aspiration  AKI  Hyperkalemia   Assessment & Plan:    Post-Op Gastrectomy  Concern for Ileus (7/18) - CCS concerned for possible efferent limb obstruction.  -continue TPN  -Remain NPO NGT to LIWS -repeat CT ABD/Pelvis ordered but not yet performed  Acute Hypoxemic Respiratory Failure, worsened on 7/18 after  aspiration episode & PEA arrest Hx OSA / Probable OHS Likely left lower lobe pneumonia versus aspiration pneumonitis Acute tachypnea thought related to [place] with appropriate compensation by Winter's.  She previously had an aspiration PNA and was treated with rocephin, required MV / extubated 7/12, and now has severe deconditioning.  Likely  underlying OHS given pCO2 on ABG. -Continue supplemental oxygen via nasal cannula.  Tolerating off BiPAP overnight -Optimize metabolics  Sepsis  No clear source, possible aspiration, UTI -continue broad antibiotic coverage with cefepime, vanc, flagyl to cover for hospital-acquired organisms as well as anaerobes  NGMA secondary to Hyperchloremic Acidosis  Hypernatremia  -D5w at 50 ml/hr > awaiting morning metabolic panel results   CKD Hypokalemia  -monitor, replace as appropriate  -Trend BMP / urinary output -Replace electrolytes as indicated -Avoid nephrotoxic agents, ensure adequate renal perfusion  Macrocytic Anemia Hgb 7.5-9 baseline -trend CBC  -transfuse for Hgb <7% or active bleeding -add IV folate  -assess B12, at risk for deficiency with gastrectomy, PPI use  Hyperbilirubinemia-stable and persistent -Continue to monitor  Severe Protein Calorie Malnutrition  Albumin 1.`5 / Pre-albumin 7.3 -con't TPN per pharmacy   -Follow electrolytes and replete as needed  Acute Metabolic Encephalopathy  Hx Anxiety, Depression In setting of hypernatremia, azotemia, critical illness.  -Delirium precautions - Continue supportive care measures  Hypothyroidism  -change synthroid to IV  DM with Hyperglycemia, uncontrolled -SSI PRN, resistant scale  -hopefully will improve if we are able to discontinue d5w -goal BG 140-180  Possible seizures; had twitching post-arrest from 7/6 -con't keppra 250mg  BID -outpatient follow up with neurology; in 3 months if no seizures they would plan to discontinue keppra  Best Practice:  DVT: lovenox  Feeding: TPN GI: protonix  Disposition: per primary    Julian Hy, DO 11/15/20 7:46 AM [facility]

## 2020-11-15 NOTE — Progress Notes (Signed)
Patient ID: [name], female   DOB: August 02, 1945, 75 y.o.   MRN: [id number] [facility] Surgery Progress Note:     Subjective: Pt transferred to ICU yesterday afternoon/early evening with tachycardia, tachypnea and lethargy. She had a brief trial of BiPAP which wasn't helpful; but she settled out without pressors or intubation.     Objective: Vital signs in last 24 hours: Temp:  [97 F (36.1 C)-98.7 F (37.1 C)] 97 F (36.1 C) (07/19 0713) Pulse Rate:  [81-150] 101 (07/19 0630) Resp:  [24-42] 28 (07/19 0630) BP: (59-169)/(38-157) 141/62 (07/19 0630) SpO2:  [86 %-100 %] 100 % (07/19 0713) FiO2 (%):  [50 %] 50 % (07/18 1933)  Intake/Output from previous day: 07/18 0701 - 07/19 0700 In: 3400.1 [I.V.:2491; IV Piggyback:909.1] Out: -  Intake/Output this shift: Total I/O In: 125 [I.V.:125] Out: 750 [Urine:550; Emesis/NG output:200]  Physical Exam: Work of breathing is not labored, though slightly tachypneic.   CV regular rate and rhythm Abd soft, non tender, non distended.     Lab Results:  Results for orders placed or performed during the hospital encounter of 10/03/2020 (from the past 48 hour(s))  Glucose, capillary     Status: Abnormal   Collection Time: 11/13/20 11:44 AM  Result Value Ref Range   Glucose-Capillary 190 (H) 70 - 99 mg/dL    Comment: Glucose reference range applies only to samples taken after fasting for at least 8 hours.   Comment 1 Notify RN    Comment 2 Document in Chart   Glucose, capillary     Status: Abnormal   Collection Time: 11/13/20  4:52 PM  Result Value Ref Range   Glucose-Capillary 196 (H) 70 - 99 mg/dL    Comment: Glucose reference range applies only to samples taken after fasting for at least 8 hours.   Comment 1 Notify RN    Comment 2 Document in Chart   Glucose, capillary     Status: Abnormal   Collection Time: 11/13/20  7:38 PM  Result Value Ref Range   Glucose-Capillary 196 (H) 70 - 99 mg/dL    Comment: Glucose reference  range applies only to samples taken after fasting for at least 8 hours.  Glucose, capillary     Status: Abnormal   Collection Time: 11/13/20 11:40 PM  Result Value Ref Range   Glucose-Capillary 182 (H) 70 - 99 mg/dL    Comment: Glucose reference range applies only to samples taken after fasting for at least 8 hours.  Glucose, capillary     Status: Abnormal   Collection Time: 11/14/20  4:01 AM  Result Value Ref Range   Glucose-Capillary 175 (H) 70 - 99 mg/dL    Comment: Glucose reference range applies only to samples taken after fasting for at least 8 hours.  Comprehensive metabolic panel     Status: Abnormal   Collection Time: 11/14/20  6:34 AM  Result Value Ref Range   Sodium 147 (H) 135 - 145 mmol/L   Potassium 4.1 3.5 - 5.1 mmol/L    Comment: SLIGHT HEMOLYSIS   Chloride 120 (H) 98 - 111 mmol/L   CO2 20 (L) 22 - 32 mmol/L   Glucose, Bld 201 (H) 70 - 99 mg/dL    Comment: Glucose reference range applies only to samples taken after fasting for at least 8 hours.   BUN 50 (H) 8 - 23 mg/dL   Creatinine, Ser 1.12 (H) 0.44 - 1.00 mg/dL   Calcium 8.1 (L) 8.9 - 10.3 mg/dL  Total Protein 5.7 (L) 6.5 - 8.1 g/dL   Albumin 1.5 (L) 3.5 - 5.0 g/dL   AST 26 15 - 41 U/L   ALT 21 0 - 44 U/L   Alkaline Phosphatase 74 38 - 126 U/L   Total Bilirubin 2.1 (H) 0.3 - 1.2 mg/dL   GFR, Estimated 51 (L) >60 mL/min    Comment: (NOTE) Calculated using the CKD-EPI Creatinine Equation (2021)    Anion gap 7 5 - 15    Comment: Performed at [facility] [address], [place], [place] [address]  Magnesium     Status: None   Collection Time: 11/14/20  6:34 AM  Result Value Ref Range   Magnesium 2.3 1.7 - 2.4 mg/dL    Comment: Performed at [facility] [address] [place], [place] [address]  Phosphorus     Status: None   Collection Time: 11/14/20  6:34 AM  Result Value Ref Range   Phosphorus 3.2 2.5 - 4.6 mg/dL    Comment: Performed at [facility] [address],  [place], [place] [address]  Triglycerides     Status: None   Collection Time: 11/14/20  6:34 AM  Result Value Ref Range   Triglycerides 133 <150 mg/dL    Comment: Performed at [facility] [address] [place], [place] [address]  Prealbumin     Status: Abnormal   Collection Time: 11/14/20  6:34 AM  Result Value Ref Range   Prealbumin 7.3 (L) 18 - 38 mg/dL    Comment: Performed at [facility] [address], [place], [place] [address]  CBC with Differential/Platelet     Status: Abnormal   Collection Time: 11/14/20  6:34 AM  Result Value Ref Range   WBC 28.7 (H) 4.0 - 10.5 K/uL   RBC 2.94 (L) 3.87 - 5.11 MIL/uL   Hemoglobin 8.5 (L) 12.0 - 15.0 g/dL   HCT 30.0 (L) 36.0 - 46.0 %   MCV 102.0 (H) 80.0 - 100.0 fL   MCH 28.9 26.0 - 34.0 pg   MCHC 28.3 (L) 30.0 - 36.0 g/dL   RDW 17.0 (H) 11.5 - 15.5 %   Platelets 271 150 - 400 K/uL   nRBC 0.0 0.0 - 0.2 %   Neutrophils Relative % 84 %   Neutro Abs 24.3 (H) 1.7 - 7.7 K/uL   Lymphocytes Relative 5 %   Lymphs Abs 1.4 0.7 - 4.0 K/uL   Monocytes Relative 7 %   Monocytes Absolute 2.0 (H) 0.1 - 1.0 K/uL   Eosinophils Relative 0 %   Eosinophils Absolute 0.0 0.0 - 0.5 K/uL   Basophils Relative 0 %   Basophils Absolute 0.0 0.0 - 0.1 K/uL   Immature Granulocytes 4 %   Abs Immature Granulocytes 1.02 (H) 0.00 - 0.07 K/uL    Comment: Performed at [facility] [address] [place], [place] [address]  Glucose, capillary     Status: Abnormal   Collection Time: 11/14/20  7:17 AM  Result Value Ref Range   Glucose-Capillary 189 (H) 70 - 99 mg/dL    Comment: Glucose reference range applies only to samples taken after fasting for at least 8 hours.   Comment 1 Notify RN    Comment 2 Document in Chart   Urinalysis, Routine w reflex microscopic Urine, Clean Catch     Status: Abnormal   Collection Time: 11/14/20  9:06 AM  Result Value Ref Range   Color,  Urine AMBER (A) YELLOW    Comment: BIOCHEMICALS MAY BE AFFECTED BY COLOR    APPearance CLOUDY (A) CLEAR   Specific Gravity, Urine 1.018 1.005 - 1.030   pH 6.0 5.0 - 8.0   Glucose, UA 150 (A) NEGATIVE mg/dL   Hgb urine dipstick SMALL (A) NEGATIVE   Bilirubin Urine NEGATIVE NEGATIVE   Ketones, ur NEGATIVE NEGATIVE mg/dL   Protein, ur 30 (A) NEGATIVE mg/dL   Nitrite NEGATIVE NEGATIVE   Leukocytes,Ua LARGE (A) NEGATIVE   RBC / HPF 21-50 0 - 5 RBC/hpf   WBC, UA 11-20 0 - 5 WBC/hpf   Bacteria, UA RARE (A) NONE SEEN   Squamous Epithelial / LPF 6-10 0 - 5   Budding Yeast PRESENT     Comment: Performed at [facility] [address], [place], [place] [address]  Culture, blood (routine x 2)     Status: None (Preliminary result)   Collection Time: 11/14/20  9:36 AM   Specimen: BLOOD  Result Value Ref Range   Specimen Description BLOOD RIGHT ANTECUBITAL    Special Requests      BOTTLES DRAWN AEROBIC AND ANAEROBIC Blood Culture results may not be optimal due to an excessive volume of blood received in culture bottles   Culture      NO GROWTH < 24 HOURS Performed at [facility] [address], [place], [place] [address]    Report Status PENDING   Culture, blood (routine x 2)     Status: None (Preliminary result)   Collection Time: 11/14/20  9:36 AM   Specimen: BLOOD RIGHT HAND  Result Value Ref Range   Specimen Description BLOOD RIGHT HAND    Special Requests      BOTTLES DRAWN AEROBIC AND ANAEROBIC Blood Culture results may not be optimal due to an excessive volume of blood received in culture bottles   Culture      NO GROWTH < 24 HOURS Performed at [facility] [address], [place], [place] [address]    Report Status PENDING   Glucose, capillary     Status: Abnormal   Collection Time: 11/14/20 11:49 AM  Result Value Ref Range   Glucose-Capillary 230 (H) 70 - 99 mg/dL    Comment: Glucose reference range applies only to samples taken after fasting for at least 8 hours.  Glucose, capillary     Status: Abnormal   Collection Time:  11/14/20  4:21 PM  Result Value Ref Range   Glucose-Capillary 175 (H) 70 - 99 mg/dL    Comment: Glucose reference range applies only to samples taken after fasting for at least 8 hours.  Glucose, capillary     Status: Abnormal   Collection Time: 11/14/20  6:24 PM  Result Value Ref Range   Glucose-Capillary 183 (H) 70 - 99 mg/dL    Comment: Glucose reference range applies only to samples taken after fasting for at least 8 hours.  Glucose, capillary     Status: Abnormal   Collection Time: 11/14/20  7:26 PM  Result Value Ref Range   Glucose-Capillary 213 (H) 70 - 99 mg/dL    Comment: Glucose reference range applies only to samples taken after fasting for at least 8 hours.  Lactic acid, plasma     Status: None   Collection Time: 11/14/20  7:43 PM  Result Value Ref Range   Lactic Acid, Venous 1.3 0.5 - 1.9 mmol/L    Comment: Performed at [facility]  [address], [place], [place] [address]  Procalcitonin - Baseline     Status: None   Collection Time: 11/14/20  7:43 PM  Result Value Ref Range   Procalcitonin 3.04 ng/mL    Comment:        Interpretation: PCT > 2 ng/mL: Systemic infection (sepsis) is likely, unless other causes are known. (NOTE)       Sepsis PCT Algorithm           Lower Respiratory Tract                                      Infection PCT Algorithm    ----------------------------     ----------------------------         PCT < 0.25 ng/mL                PCT < 0.10 ng/mL          Strongly encourage             Strongly discourage   discontinuation of antibiotics    initiation of antibiotics    ----------------------------     -----------------------------       PCT 0.25 - 0.50 ng/mL            PCT 0.10 - 0.25 ng/mL               OR       >80% decrease in PCT            Discourage initiation of                                            antibiotics      Encourage discontinuation           of antibiotics    ----------------------------      -----------------------------         PCT >= 0.50 ng/mL              PCT 0.26 - 0.50 ng/mL               AND       <80% decrease in PCT              Encourage initiation of                                             antibiotics       Encourage continuation           of antibiotics    ----------------------------     -----------------------------        PCT >= 0.50 ng/mL                  PCT > 0.50 ng/mL               AND         increase in PCT                  Strongly encourage  initiation of antibiotics    Strongly encourage escalation           of antibiotics                                     -----------------------------                                           PCT <= 0.25 ng/mL                                                 OR                                        > 80% decrease in PCT                                      Discontinue / Do not initiate                                             antibiotics  Performed at [facility], [address], [place], [place] [address]   Vitamin B12     Status: Abnormal   Collection Time: 11/14/20  7:43 PM  Result Value Ref Range   Vitamin B-12 1,132 (H) 180 - 914 pg/mL    Comment: (NOTE) This assay is not validated for testing neonatal or myeloproliferative syndrome specimens for Vitamin B12 levels. Performed at [facility], [place] [address], [place], [place] [address]   I-STAT 7, (LYTES, BLD GAS, ICA, H+H)     Status: Abnormal   Collection Time: 11/14/20  9:10 PM  Result Value Ref Range   pH, Arterial 7.316 (L) 7.350 - 7.450   pCO2 arterial 37.2 32.0 - 48.0 mmHg   pO2, Arterial 152 (H) 83.0 - 108.0 mmHg   Bicarbonate 19.0 (L) 20.0 - 28.0 mmol/L   TCO2 20 (L) 22 - 32 mmol/L   O2 Saturation 99.0 %   Acid-base deficit 7.0 (H) 0.0 - 2.0 mmol/L   Sodium 148 (H) 135 - 145 mmol/L   Potassium 4.4 3.5 - 5.1 mmol/L   Calcium, Ion 1.30 1.15 - 1.40 mmol/L   HCT 24.0 (L) 36.0 -  46.0 %   Hemoglobin 8.2 (L) 12.0 - 15.0 g/dL   Patient temperature 98.7 F    Sample type ARTERIAL   I-STAT 7, (LYTES, BLD GAS, ICA, H+H)     Status: Abnormal   Collection Time: 11/14/20  9:48 PM  Result Value Ref Range   pH, Arterial 7.330 (L) 7.350 - 7.450   pCO2 arterial 33.8 32.0 - 48.0 mmHg   pO2, Arterial 65 (L) 83.0 - 108.0 mmHg   Bicarbonate 17.8 (L) 20.0 - 28.0 mmol/L   TCO2 19 (L) 22 - 32 mmol/L   O2 Saturation 91.0 %   Acid-base deficit 7.0 (H) 0.0 -  2.0 mmol/L   Sodium 147 (H) 135 - 145 mmol/L   Potassium 4.4 3.5 - 5.1 mmol/L   Calcium, Ion 1.31 1.15 - 1.40 mmol/L   HCT 24.0 (L) 36.0 - 46.0 %   Hemoglobin 8.2 (L) 12.0 - 15.0 g/dL   Patient temperature 98.7 F    Sample type ARTERIAL   Glucose, capillary     Status: Abnormal   Collection Time: 11/14/20 11:46 PM  Result Value Ref Range   Glucose-Capillary 195 (H) 70 - 99 mg/dL    Comment: Glucose reference range applies only to samples taken after fasting for at least 8 hours.  CBC with Differential/Platelet     Status: Abnormal   Collection Time: 11/15/20  1:24 AM  Result Value Ref Range   WBC 24.6 (H) 4.0 - 10.5 K/uL   RBC 2.38 (L) 3.87 - 5.11 MIL/uL   Hemoglobin 7.8 (L) 12.0 - 15.0 g/dL   HCT 26.4 (L) 36.0 - 46.0 %   MCV 110.9 (H) 80.0 - 100.0 fL    Comment: REPEATED TO VERIFY DELTA CHECK NOTED    MCH 32.8 26.0 - 34.0 pg   MCHC 29.5 (L) 30.0 - 36.0 g/dL   RDW 18.3 (H) 11.5 - 15.5 %   Platelets 232 150 - 400 K/uL   nRBC 0.0 0.0 - 0.2 %   Neutrophils Relative % 85 %   Neutro Abs 20.8 (H) 1.7 - 7.7 K/uL   Lymphocytes Relative 5 %   Lymphs Abs 1.3 0.7 - 4.0 K/uL   Monocytes Relative 6 %   Monocytes Absolute 1.5 (H) 0.1 - 1.0 K/uL   Eosinophils Relative 0 %   Eosinophils Absolute 0.0 0.0 - 0.5 K/uL   Basophils Relative 0 %   Basophils Absolute 0.0 0.0 - 0.1 K/uL   Immature Granulocytes 4 %   Abs Immature Granulocytes 0.88 (H) 0.00 - 0.07 K/uL    Comment: Performed at [facility], [address] [place], [place] [address]  Glucose, capillary     Status: Abnormal   Collection Time: 11/15/20  3:44 AM  Result Value Ref Range   Glucose-Capillary 151 (H) 70 - 99 mg/dL    Comment: Glucose reference range applies only to samples taken after fasting for at least 8 hours.  Comprehensive metabolic panel     Status: Abnormal   Collection Time: 11/15/20  5:45 AM  Result Value Ref Range   Sodium 145 135 - 145 mmol/L   Potassium 4.2 3.5 - 5.1 mmol/L   Chloride 118 (H) 98 - 111 mmol/L   CO2 20 (L) 22 - 32 mmol/L   Glucose, Bld 190 (H) 70 - 99 mg/dL    Comment: Glucose reference range applies only to samples taken after fasting for at least 8 hours.   BUN 65 (H) 8 - 23 mg/dL   Creatinine, Ser 1.71 (H) 0.44 - 1.00 mg/dL   Calcium 8.5 (L) 8.9 - 10.3 mg/dL   Total Protein 5.9 (L) 6.5 - 8.1 g/dL   Albumin 1.6 (L) 3.5 - 5.0 g/dL   AST 26 15 - 41 U/L   ALT 21 0 - 44 U/L   Alkaline Phosphatase 70 38 - 126 U/L   Total Bilirubin 2.0 (H) 0.3 - 1.2 mg/dL   GFR, Estimated 30 (L) >60 mL/min    Comment: (NOTE) Calculated using the CKD-EPI Creatinine Equation (2021)    Anion gap 7 5 - 15    Comment: Performed at [facility], [address] [place], [place] [address]  Magnesium     Status: None   Collection Time: 11/15/20  5:45 AM  Result Value Ref Range   Magnesium 2.3 1.7 - 2.4 mg/dL    Comment: Performed at [facility], [place] [address] [place], [place] [address]  Procalcitonin     Status: None   Collection Time: 11/15/20  5:45 AM  Result Value Ref Range   Procalcitonin 3.39 ng/mL    Comment:        Interpretation: PCT > 2 ng/mL: Systemic infection (sepsis) is likely, unless other causes are known. (NOTE)       Sepsis PCT Algorithm           Lower Respiratory Tract                                      Infection PCT Algorithm    ----------------------------     ----------------------------         PCT < 0.25 ng/mL                PCT < 0.10 ng/mL          Strongly encourage              Strongly discourage   discontinuation of antibiotics    initiation of antibiotics    ----------------------------     -----------------------------       PCT 0.25 - 0.50 ng/mL            PCT 0.10 - 0.25 ng/mL               OR       >80% decrease in PCT            Discourage initiation of                                            antibiotics      Encourage discontinuation           of antibiotics    ----------------------------     -----------------------------         PCT >= 0.50 ng/mL              PCT 0.26 - 0.50 ng/mL               AND       <80% decrease in PCT              Encourage initiation of                                             antibiotics       Encourage continuation           of antibiotics    ----------------------------     -----------------------------        PCT >= 0.50 ng/mL                  PCT > 0.50 ng/mL               AND         increase in PCT  Strongly encourage                                      initiation of antibiotics    Strongly encourage escalation           of antibiotics                                     -----------------------------                                           PCT <= 0.25 ng/mL                                                 OR                                        > 80% decrease in PCT                                      Discontinue / Do not initiate                                             antibiotics  Performed at [facility], [place] [address], [place], [place] [address]   Phosphorus     Status: None   Collection Time: 11/15/20  5:45 AM  Result Value Ref Range   Phosphorus 4.0 2.5 - 4.6 mg/dL    Comment: Performed at [facility] [address] [place], [place] [address]  Glucose, capillary     Status: Abnormal   Collection Time: 11/15/20  7:53 AM  Result Value Ref Range   Glucose-Capillary 203 (H) 70 - 99 mg/dL    Comment: Glucose reference range applies only to samples  taken after fasting for at least 8 hours.    Radiology/Results: DG CHEST PORT 1 VIEW  Result Date: 11/15/2020 CLINICAL DATA:  Shortness of breath. Recent gastrostomy. History of respiratory failure. EXAM: PORTABLE CHEST 1 VIEW COMPARISON:  11/14/2020. FINDINGS: Interim removal of feeding tube. NG tube and left PICC line in stable position. Severe cardiomegaly again noted. Mild bilateral increase interstitial prominence noted suggesting interstitial edema. Pneumonitis cannot be excluded. Mild atelectatic changes right mid lung again noted. Small left pleural effusion noted on today's exam. No pneumothorax. Prior cervical spine fusion. IMPRESSION: 1. Interim removal of feeding tube. NG tube and left PICC line stable position. 2. Severe cardiomegaly again noted. Mild bilateral increase interstitial prominence noted suggesting interstitial edema. Pneumonitis cannot be excluded. Small left pleural effusion noted on today's exam Electronically Signed   By: Marcello Moores  Register   On: 11/15/2020 06:24   DG CHEST PORT 1 VIEW  Result Date: 11/14/2020 CLINICAL DATA:  Leukocytosis, history CHF, hypertension, stroke, on BiPAP EXAM: PORTABLE CHEST  1 VIEW COMPARISON:  Portable exam 0915 hours compared to 11/23/2020 FINDINGS: Nasogastric tube and feeding tube extends into stomach. Enlargement of cardiac silhouette with pulmonary vascular congestion. Atherosclerotic calcification aorta. Mild pulmonary infiltrates likely reflecting pulmonary edema. Subsegmental atelectasis RIGHT upper lobe. No pleural effusion or pneumothorax. Bones demineralized with evidence of prior cervical spine fusion. IMPRESSION: Enlargement of cardiac silhouette with pulmonary vascular congestion and minimal pulmonary edema. Subsegmental atelectasis RIGHT upper lobe unchanged. Aortic Atherosclerosis (ICD10-I70.0). Electronically Signed   By: Lavonia Dana M.D.   On: 11/14/2020 11:21   [place] EKG SITE RITE  Result Date: 11/13/2020 If Site Rite image not  attached, placement could not be confirmed due to current cardiac rhythm.   Anti-infectives: Anti-infectives (From admission, onward)    Start     Dose/Rate Route Frequency Ordered Stop   11/15/20 2000  vancomycin (VANCOCIN) IVPB 1000 mg/200 mL premix        1,000 mg 200 mL/hr over 60 Minutes Intravenous Every 24 hours 11/15/20 0827     11/15/20 1800  ceFEPIme (MAXIPIME) 2 g in sodium chloride 0.9 % 100 mL IVPB        2 g 200 mL/hr over 30 Minutes Intravenous Every 24 hours 11/15/20 0827     11/14/20 1945  ceFEPIme (MAXIPIME) 2 g in sodium chloride 0.9 % 100 mL IVPB  Status:  Discontinued        2 g 200 mL/hr over 30 Minutes Intravenous Every 12 hours 11/14/20 1853 11/15/20 0827   11/14/20 1945  vancomycin (VANCOCIN) IVPB 1000 mg/200 mL premix  Status:  Discontinued        1,000 mg 200 mL/hr over 60 Minutes Intravenous Every 24 hours 11/14/20 1855 11/15/20 0827   11/14/20 1615  cefTRIAXone (ROCEPHIN) 2 g in sodium chloride 0.9 % 100 mL IVPB  Status:  Discontinued        2 g 200 mL/hr over 30 Minutes Intravenous Every 24 hours 11/14/20 1521 11/14/20 1802   11/14/20 1615  metroNIDAZOLE (FLAGYL) IVPB 500 mg        500 mg 100 mL/hr over 60 Minutes Intravenous Every 8 hours 11/14/20 1521     11/04/20 1245  cefTRIAXone (ROCEPHIN) 2 g in sodium chloride 0.9 % 100 mL IVPB        2 g 200 mL/hr over 30 Minutes Intravenous Every 24 hours 11/04/20 1148 11/06/20 1302   11/02/20 1000  cefTRIAXone (ROCEPHIN) 2 g in sodium chloride 0.9 % 100 mL IVPB  Status:  Discontinued        2 g 200 mL/hr over 30 Minutes Intravenous Every 24 hours 11/01/20 1257 11/02/20 0856   11/02/20 1000  doxycycline (VIBRAMYCIN) 100 mg in sodium chloride 0.9 % 250 mL IVPB  Status:  Discontinued        100 mg 125 mL/hr over 120 Minutes Intravenous Every 12 hours 11/01/20 1257 11/02/20 0856   11/02/20 1000  ceFEPIme (MAXIPIME) 2 g in sodium chloride 0.9 % 100 mL IVPB  Status:  Discontinued        2 g 200 mL/hr over 30  Minutes Intravenous Every 12 hours 11/02/20 0856 11/04/20 1148   11/01/20 0915  levofloxacin (LEVAQUIN) IVPB 750 mg  Status:  Discontinued        750 mg 100 mL/hr over 90 Minutes Intravenous Every 24 hours 11/01/20 0827 11/01/20 1257   11/01/20 0730  levofloxacin (LEVAQUIN) IVPB 500 mg  Status:  Discontinued        500 mg 100 mL/hr over  60 Minutes Intravenous Every 24 hours 11/01/20 0631 11/01/20 1257   10/27/2020 2100  ciprofloxacin (CIPRO) IVPB 400 mg        400 mg 200 mL/hr over 60 Minutes Intravenous Every 12 hours 10/09/2020 1258 10/04/2020 2148   10/26/2020 0600  ciprofloxacin (CIPRO) IVPB 400 mg        400 mg 200 mL/hr over 60 Minutes Intravenous On call to O.R. 09/30/2020 0547 10/24/2020 0847       Assessment/Plan: Problem List: Patient Active Problem List   Diagnosis Date Noted   Pressure injury of skin 11/04/2020   Cardiac arrest ([facility])    Emesis    Respiratory failure ([facility])    SOB (shortness of breath)    Acute respiratory failure with hypoxemia ([place])    Encounter for central line placement    S/P partial gastrectomy 10/13/2020   Type 2 diabetes mellitus without complication ([facility] [place]) 77/41/2878   Hyperlipidemia 03/01/2014   Focal seizures ([facility]) 10/21/2013   TIA (transient ischemic attack) 10/21/2013   CHEST PAIN 01/24/2010   BRADYCARDIA 12/23/2009   Diabetes ([facility]) 08/04/2008   HYPOTHYROIDISM 03/06/2007   OBESITY 03/06/2007   OBSTRUCTIVE SLEEP APNEA 03/06/2007   HYPERTENSION, BENIGN 03/06/2007   CORONARY ARTERY DISEASE 03/06/2007   ALLERGIC RHINITIS 03/06/2007   DEGENERATIVE JOINT DISEASE, GENERALIZED 03/06/2007   BACK PAIN, CHRONIC 03/06/2007    Efferent limb obstruction.   Will likely need to be revised, but patient currently too sick for this. Will figure out target time once nutrition and respiratory status a bit improved.   TNA for severe protein calorie malnutrition and intolerance of enteric feeding. AKI again. Add 25% albumin boluses for 48 hours to minimize fluid  effect on lungs.       LOS: 21 days   Milus Height, MD FACS Surgical Oncology, General Surgery, Trauma and [facility], [place] for weekday/non holidays Check [email or web address] for coverage night/weekend/holidays  Do not use SecureChat as it is not reliable for timely patient care.     11/15/2020 9:53 AM

## 2020-11-15 NOTE — Progress Notes (Signed)
PHARMACY - TOTAL PARENTERAL NUTRITION CONSULT NOTE  Indication: Partial gastrectomy and TF intolerance   Patient Measurements: Height: 4\' 11"  (149.9 cm) Weight: 98.8 kg (217 lb 13 oz) IBW/kg (Calculated) : 43.2 TPN AdjBW (KG): 56.1 Body mass index is 43.99 kg/m. Usual Weight: 95 kg  Assessment:  75 yo W admitted for 6/28 elective distal gastrectomy for malignant polyp. Patient had a 6 minute code on 7/5 with severe respiratory failure and shock. Patient had an ileus and presumed functional gastric outlet obstruction post-op.  Patient tolerated TF 7/5-7/7 at 20 ml/hr, 7/7- 7/8 at 30 ml/hr, 7/9-7/10 at 40 ml/hr (goal 55 ml/hr). TF were held due to access issues then restarted 7/12 at 20-30 ml/hr. TF were stopped 7/13 for large volume bilious emesis that does not look like TF. Weight unchanged from usual but had anasarca earlier this admit, now resolved.   Glucose / Insulin: hx DM on glipizide PTA - CBGs elevated post TPN initiation. Cbgs 175 - 230. - Received 30 units insulin in past 24 hrs + 60 units in TPN - Started on D5 @ 50 ml/hr for hypernatremia Electrolytes: Na 145 (on D5, Na removed in TPN on 7/16), Corr Ca 10.42 (Ca resumed in TPN on 7/18), Cl 118, CO2 20, Mg 2.3. Other electrolytes WNL.  Renal:  Scr 1.71 (BL 1.0 - 1.1). BUN 65 GI/Hepatic: LFTs wnl. Tbili 2.0 (sclera jaundice on 7/15 and no jaundice present 7/19 per RN). Albumin 1.6. Prealbumin 7.3. TG wnl.  Intake / Output; MIVF:  UOP not charted yesterday - urine occurrence x1. 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 Continue Na 0 mEq/L, K 40 mEq/L. Continue max acetate.  Continue D5W and insulin 3 units q4h per MD Add standard MVI and increase trace elements to full amount since no jaundice present Continue resistant SSI Q4H and continue regular insulin in TPN at 60 units Standard TPN labs on Mon/Thurs, repeat electrolytes tomorrow   Cristela Felt, PharmD, BCPS Clinical Pharmacist 11/15/2020 7:10 AM

## 2020-11-16 ENCOUNTER — Inpatient Hospital Stay (HOSPITAL_COMMUNITY): Payer: Medicare PPO

## 2020-11-16 DIAGNOSIS — I469 Cardiac arrest, cause unspecified: Secondary | ICD-10-CM | POA: Diagnosis not present

## 2020-11-16 DIAGNOSIS — J690 Pneumonitis due to inhalation of food and vomit: Secondary | ICD-10-CM | POA: Diagnosis not present

## 2020-11-16 DIAGNOSIS — J9601 Acute respiratory failure with hypoxia: Secondary | ICD-10-CM | POA: Diagnosis not present

## 2020-11-16 DIAGNOSIS — R5381 Other malaise: Secondary | ICD-10-CM

## 2020-11-16 LAB — POCT I-STAT 7, (LYTES, BLD GAS, ICA,H+H)
Acid-base deficit: 16 mmol/L — ABNORMAL HIGH (ref 0.0–2.0)
Acid-base deficit: 10 mmol/L — ABNORMAL HIGH (ref 0.0–2.0)
Acid-base deficit: 1 mmol/L (ref 0.0–2.0)
Bicarbonate: 14 mmol/L — ABNORMAL LOW (ref 20.0–28.0)
Bicarbonate: 20 mmol/L (ref 20.0–28.0)
Bicarbonate: 24.8 mmol/L (ref 20.0–28.0)
Calcium, Ion: 1.33 mmol/L (ref 1.15–1.40)
Calcium, Ion: 1.22 mmol/L (ref 1.15–1.40)
Calcium, Ion: 1.07 mmol/L — ABNORMAL LOW (ref 1.15–1.40)
HCT: 22 % — ABNORMAL LOW (ref 36.0–46.0)
HCT: 20 % — ABNORMAL LOW (ref 36.0–46.0)
HCT: 29 % — ABNORMAL LOW (ref 36.0–46.0)
Hemoglobin: 7.5 g/dL — ABNORMAL LOW (ref 12.0–15.0)
Hemoglobin: 6.8 g/dL — CL (ref 12.0–15.0)
Hemoglobin: 9.9 g/dL — ABNORMAL LOW (ref 12.0–15.0)
O2 Saturation: 72 %
O2 Saturation: 55 %
O2 Saturation: 87 %
Patient temperature: 98.4
Patient temperature: 98.4
Potassium: 4.6 mmol/L (ref 3.5–5.1)
Potassium: 4.9 mmol/L (ref 3.5–5.1)
Potassium: 3.2 mmol/L — ABNORMAL LOW (ref 3.5–5.1)
Sodium: 147 mmol/L — ABNORMAL HIGH (ref 135–145)
Sodium: 152 mmol/L — ABNORMAL HIGH (ref 135–145)
Sodium: 150 mmol/L — ABNORMAL HIGH (ref 135–145)
TCO2: 16 mmol/L — ABNORMAL LOW (ref 22–32)
TCO2: 22 mmol/L (ref 22–32)
TCO2: 26 mmol/L (ref 22–32)
pCO2 arterial: 57.6 mmHg — ABNORMAL HIGH (ref 32.0–48.0)
pCO2 arterial: 70.8 mmHg (ref 32.0–48.0)
pCO2 arterial: 48 mmHg (ref 32.0–48.0)
pH, Arterial: 6.994 — CL (ref 7.350–7.450)
pH, Arterial: 7.057 — CL (ref 7.350–7.450)
pH, Arterial: 7.321 — ABNORMAL LOW (ref 7.350–7.450)
pO2, Arterial: 57 mmHg — ABNORMAL LOW (ref 83.0–108.0)
pO2, Arterial: 41 mmHg — ABNORMAL LOW (ref 83.0–108.0)
pO2, Arterial: 58 mmHg — ABNORMAL LOW (ref 83.0–108.0)

## 2020-11-16 LAB — CBC
HCT: 35.4 % — ABNORMAL LOW (ref 36.0–46.0)
HCT: 23.5 % — ABNORMAL LOW (ref 36.0–46.0)
Hemoglobin: 10.3 g/dL — ABNORMAL LOW (ref 12.0–15.0)
Hemoglobin: 6.6 g/dL — CL (ref 12.0–15.0)
MCH: 28.9 pg (ref 26.0–34.0)
MCH: 29.2 pg (ref 26.0–34.0)
MCHC: 29.1 g/dL — ABNORMAL LOW (ref 30.0–36.0)
MCHC: 28.1 g/dL — ABNORMAL LOW (ref 30.0–36.0)
MCV: 99.2 fL (ref 80.0–100.0)
MCV: 104 fL — ABNORMAL HIGH (ref 80.0–100.0)
Platelets: 113 10*3/uL — ABNORMAL LOW (ref 150–400)
Platelets: 234 10*3/uL (ref 150–400)
RBC: 3.57 MIL/uL — ABNORMAL LOW (ref 3.87–5.11)
RBC: 2.26 MIL/uL — ABNORMAL LOW (ref 3.87–5.11)
RDW: 16.9 % — ABNORMAL HIGH (ref 11.5–15.5)
RDW: 17.3 % — ABNORMAL HIGH (ref 11.5–15.5)
WBC: 10.7 10*3/uL — ABNORMAL HIGH (ref 4.0–10.5)
WBC: 23.3 10*3/uL — ABNORMAL HIGH (ref 4.0–10.5)
nRBC: 0.3 % — ABNORMAL HIGH (ref 0.0–0.2)
nRBC: 4.4 % — ABNORMAL HIGH (ref 0.0–0.2)

## 2020-11-16 LAB — HEMOGLOBIN AND HEMATOCRIT, BLOOD
HCT: 29.6 % — ABNORMAL LOW (ref 36.0–46.0)
Hemoglobin: 8.6 g/dL — ABNORMAL LOW (ref 12.0–15.0)

## 2020-11-16 LAB — COMPREHENSIVE METABOLIC PANEL
ALT: 30 U/L (ref 0–44)
AST: 105 U/L — ABNORMAL HIGH (ref 15–41)
Albumin: 2.5 g/dL — ABNORMAL LOW (ref 3.5–5.0)
Alkaline Phosphatase: 106 U/L (ref 38–126)
Anion gap: 17 — ABNORMAL HIGH (ref 5–15)
BUN: 64 mg/dL — ABNORMAL HIGH (ref 8–23)
CO2: 23 mmol/L (ref 22–32)
Calcium: 7.8 mg/dL — ABNORMAL LOW (ref 8.9–10.3)
Chloride: 106 mmol/L (ref 98–111)
Creatinine, Ser: 2.08 mg/dL — ABNORMAL HIGH (ref 0.44–1.00)
GFR, Estimated: 24 mL/min — ABNORMAL LOW (ref >60)
Glucose, Bld: 239 mg/dL — ABNORMAL HIGH (ref 70–99)
Potassium: 3.3 mmol/L — ABNORMAL LOW (ref 3.5–5.1)
Sodium: 146 mmol/L — ABNORMAL HIGH (ref 135–145)
Total Bilirubin: 2.5 mg/dL — ABNORMAL HIGH (ref 0.3–1.2)
Total Protein: 5.4 g/dL — ABNORMAL LOW (ref 6.5–8.1)

## 2020-11-16 LAB — BASIC METABOLIC PANEL
Anion gap: 8 (ref 5–15)
Anion gap: 5 (ref 5–15)
Anion gap: 22 — ABNORMAL HIGH (ref 5–15)
BUN: 62 mg/dL — ABNORMAL HIGH (ref 8–23)
BUN: 60 mg/dL — ABNORMAL HIGH (ref 8–23)
BUN: 61 mg/dL — ABNORMAL HIGH (ref 8–23)
CO2: 18 mmol/L — ABNORMAL LOW (ref 22–32)
CO2: 19 mmol/L — ABNORMAL LOW (ref 22–32)
CO2: 21 mmol/L — ABNORMAL LOW (ref 22–32)
Calcium: 9 mg/dL (ref 8.9–10.3)
Calcium: 9 mg/dL (ref 8.9–10.3)
Calcium: 8 mg/dL — ABNORMAL LOW (ref 8.9–10.3)
Chloride: 115 mmol/L — ABNORMAL HIGH (ref 98–111)
Chloride: 119 mmol/L — ABNORMAL HIGH (ref 98–111)
Chloride: 110 mmol/L (ref 98–111)
Creatinine, Ser: 1.51 mg/dL — ABNORMAL HIGH (ref 0.44–1.00)
Creatinine, Ser: 1.47 mg/dL — ABNORMAL HIGH (ref 0.44–1.00)
Creatinine, Ser: 1.95 mg/dL — ABNORMAL HIGH (ref 0.44–1.00)
GFR, Estimated: 35 mL/min — ABNORMAL LOW (ref >60)
GFR, Estimated: 37 mL/min — ABNORMAL LOW (ref >60)
GFR, Estimated: 26 mL/min — ABNORMAL LOW (ref >60)
Glucose, Bld: 174 mg/dL — ABNORMAL HIGH (ref 70–99)
Glucose, Bld: 194 mg/dL — ABNORMAL HIGH (ref 70–99)
Glucose, Bld: 344 mg/dL — ABNORMAL HIGH (ref 70–99)
Potassium: 3.6 mmol/L (ref 3.5–5.1)
Potassium: 3.7 mmol/L (ref 3.5–5.1)
Potassium: 3.8 mmol/L (ref 3.5–5.1)
Sodium: 141 mmol/L (ref 135–145)
Sodium: 143 mmol/L (ref 135–145)
Sodium: 153 mmol/L — ABNORMAL HIGH (ref 135–145)

## 2020-11-16 LAB — GLUCOSE, CAPILLARY
Glucose-Capillary: 160 mg/dL — ABNORMAL HIGH (ref 70–99)
Glucose-Capillary: 165 mg/dL — ABNORMAL HIGH (ref 70–99)
Glucose-Capillary: 222 mg/dL — ABNORMAL HIGH (ref 70–99)
Glucose-Capillary: 226 mg/dL — ABNORMAL HIGH (ref 70–99)
Glucose-Capillary: 266 mg/dL — ABNORMAL HIGH (ref 70–99)

## 2020-11-16 LAB — PHOSPHORUS
Phosphorus: 3.1 mg/dL (ref 2.5–4.6)
Phosphorus: 7 mg/dL — ABNORMAL HIGH (ref 2.5–4.6)

## 2020-11-16 LAB — URINE CULTURE: Culture: 20000 — AB

## 2020-11-16 LAB — PREPARE RBC (CROSSMATCH)

## 2020-11-16 LAB — MAGNESIUM
Magnesium: 2.1 mg/dL (ref 1.7–2.4)
Magnesium: 2.3 mg/dL (ref 1.7–2.4)

## 2020-11-16 LAB — LACTIC ACID, PLASMA
Lactic Acid, Venous: 1.1 mmol/L (ref 0.5–1.9)
Lactic Acid, Venous: >11 mmol/L (ref 0.5–1.9)

## 2020-11-16 LAB — PROCALCITONIN: Procalcitonin: 2.95 ng/mL

## 2020-11-16 MED ORDER — VASOPRESSIN 20 UNITS/100 ML INFUSION FOR SHOCK
0.0000 [IU]/min | INTRAVENOUS | Status: DC
  Administered 2020-11-16 – 2020-11-17 (×3): 0.04 [IU]/min via INTRAVENOUS
  Administered 2020-11-18: 0.03 [IU]/min via INTRAVENOUS
  Administered 2020-11-19: 0.02 [IU]/min via INTRAVENOUS
  Filled 2020-11-16 (×5): qty 100

## 2020-11-16 MED ORDER — ROCURONIUM BROMIDE 50 MG/5ML IV SOLN
100.0000 mg | Freq: Once | INTRAVENOUS | Status: DC
  Filled 2020-11-16: qty 10

## 2020-11-16 MED ORDER — DOCUSATE SODIUM 50 MG/5ML PO LIQD: 100.0000 mg | Freq: Two times a day (BID) | ORAL | Status: DC

## 2020-11-16 MED ORDER — MIDAZOLAM 50MG/50ML (1MG/ML) PREMIX INFUSION
0.5000 mg/h | INTRAVENOUS | Status: DC
  Administered 2020-11-16: 0.5 mg/h via INTRAVENOUS
  Administered 2020-11-17: 4.5 mg/h via INTRAVENOUS
  Administered 2020-11-17 – 2020-11-18 (×2): 4 mg/h via INTRAVENOUS
  Administered 2020-11-18: 4.5 mg/h via INTRAVENOUS
  Administered 2020-11-19: 5 mg/h via INTRAVENOUS
  Administered 2020-11-19: 2 mg/h via INTRAVENOUS
  Filled 2020-11-16 (×7): qty 50

## 2020-11-16 MED ORDER — HYDROCORTISONE NA SUCCINATE PF 100 MG IJ SOLR
50.0000 mg | Freq: Four times a day (QID) | INTRAMUSCULAR | Status: DC
  Administered 2020-11-16 – 2020-11-18 (×8): 50 mg via INTRAVENOUS
  Filled 2020-11-16 (×8): qty 2

## 2020-11-16 MED ORDER — CHLORHEXIDINE GLUCONATE 0.12% ORAL RINSE (MEDLINE KIT)
15.0000 mL | Freq: Two times a day (BID) | OROMUCOSAL | Status: DC
  Administered 2020-11-16 – 2020-11-21 (×11): 15 mL via OROMUCOSAL

## 2020-11-16 MED ORDER — ROCURONIUM BROMIDE 10 MG/ML (PF) SYRINGE
PREFILLED_SYRINGE | INTRAVENOUS | Status: AC
  Filled 2020-11-16: qty 10

## 2020-11-16 MED ORDER — SODIUM CHLORIDE 0.9 % IV SOLN
2.0000 g | Freq: Two times a day (BID) | INTRAVENOUS | Status: DC
  Administered 2020-11-16 (×2): 2 g via INTRAVENOUS
  Filled 2020-11-16 (×2): qty 2

## 2020-11-16 MED ORDER — MIDAZOLAM HCL 2 MG/2ML IJ SOLN: 1.0000 mg | INTRAMUSCULAR | Status: DC | PRN

## 2020-11-16 MED ORDER — FENTANYL BOLUS VIA INFUSION
25.0000 ug | INTRAVENOUS | Status: DC | PRN
  Administered 2020-11-16 – 2020-11-17 (×3): 25 ug via INTRAVENOUS
  Administered 2020-11-18 (×2): 50 ug via INTRAVENOUS
  Filled 2020-11-16: qty 100

## 2020-11-16 MED ORDER — SODIUM BICARBONATE 8.4 % IV SOLN
INTRAVENOUS | Status: AC
  Administered 2020-11-16: 50 meq
  Filled 2020-11-16: qty 100

## 2020-11-16 MED ORDER — MIDAZOLAM HCL 2 MG/2ML IJ SOLN
1.0000 mg | INTRAMUSCULAR | Status: AC | PRN
Start: 2020-11-16 — End: 2020-11-16
  Administered 2020-11-16 (×3): 1 mg via INTRAVENOUS
  Filled 2020-11-16: qty 2

## 2020-11-16 MED ORDER — POLYETHYLENE GLYCOL 3350 17 G PO PACK: 17.0000 g | PACK | Freq: Every day | ORAL | Status: DC

## 2020-11-16 MED ORDER — INSULIN ASPART 100 UNIT/ML IJ SOLN: 4.0000 [IU] | INTRAMUSCULAR | Status: DC

## 2020-11-16 MED ORDER — FENTANYL CITRATE (PF) 100 MCG/2ML IJ SOLN
25.0000 ug | Freq: Once | INTRAMUSCULAR | Status: AC
  Administered 2020-11-16: 25 ug via INTRAVENOUS

## 2020-11-16 MED ORDER — EPINEPHRINE HCL 5 MG/250ML IV SOLN IN NS
0.5000 ug/min | INTRAVENOUS | Status: DC
Start: 2020-11-16 — End: 2020-11-16

## 2020-11-16 MED ORDER — SODIUM CHLORIDE 0.9 % IV SOLN
0.5000 ug/min | INTRAVENOUS | Status: DC
  Administered 2020-11-16: 10 ug/min via INTRAVENOUS
  Administered 2020-11-17: 8 ug/min via INTRAVENOUS
  Administered 2020-11-18: 5 ug/min via INTRAVENOUS
  Administered 2020-11-20: 3 ug/min via INTRAVENOUS
  Filled 2020-11-16 (×5): qty 10

## 2020-11-16 MED ORDER — NOREPINEPHRINE 4 MG/250ML-% IV SOLN
INTRAVENOUS | Status: AC
  Administered 2020-11-16: 10 ug/min via INTRAVENOUS
  Filled 2020-11-16: qty 250

## 2020-11-16 MED ORDER — MIDAZOLAM HCL 2 MG/2ML IJ SOLN
1.0000 mg | INTRAMUSCULAR | Status: DC | PRN
  Administered 2020-11-18: 1 mg via INTRAVENOUS
  Filled 2020-11-16: qty 2

## 2020-11-16 MED ORDER — FENTANYL 2500MCG IN NS 250ML (10MCG/ML) PREMIX INFUSION
25.0000 ug/h | INTRAVENOUS | Status: DC
  Administered 2020-11-16: 25 ug/h via INTRAVENOUS
  Administered 2020-11-17 – 2020-11-19 (×5): 200 ug/h via INTRAVENOUS
  Administered 2020-11-20: 150 ug/h via INTRAVENOUS
  Administered 2020-11-21: 25 ug/h via INTRAVENOUS
  Filled 2020-11-16 (×8): qty 250

## 2020-11-16 MED ORDER — SODIUM BICARBONATE 8.4 % IV SOLN
INTRAVENOUS | Status: AC
  Administered 2020-11-16: 100 meq
  Filled 2020-11-16: qty 100

## 2020-11-16 MED ORDER — FENTANYL CITRATE (PF) 100 MCG/2ML IJ SOLN: 25.0000 ug | INTRAMUSCULAR | Status: DC | PRN

## 2020-11-16 MED ORDER — SODIUM CHLORIDE 0.9 % IV SOLN
2.0000 g | INTRAVENOUS | Status: DC
  Administered 2020-11-17: 2 g via INTRAVENOUS
  Filled 2020-11-16: qty 2

## 2020-11-16 MED ORDER — MIDAZOLAM HCL 2 MG/2ML IJ SOLN
1.0000 mg | INTRAMUSCULAR | Status: DC | PRN
Start: 2020-11-16 — End: 2020-11-21
  Administered 2020-11-21: 1 mg via INTRAVENOUS
  Filled 2020-11-16 (×2): qty 2

## 2020-11-16 MED ORDER — EPINEPHRINE HCL 5 MG/250ML IV SOLN IN NS
INTRAVENOUS | Status: AC
  Administered 2020-11-16: 0.5 ug/min via INTRAVENOUS
  Filled 2020-11-16: qty 250

## 2020-11-16 MED ORDER — TRAVASOL 10 % IV SOLN
INTRAVENOUS | Status: AC
  Filled 2020-11-16: qty 1080

## 2020-11-16 MED ORDER — EPINEPHRINE 1 MG/10ML IJ SOSY
0.7500 mg | PREFILLED_SYRINGE | Freq: Once | INTRAMUSCULAR | Status: AC
  Administered 2020-11-16: 0.75 mg via INTRAVENOUS

## 2020-11-16 MED ORDER — SODIUM CHLORIDE 0.9% IV SOLUTION: Freq: Once | INTRAVENOUS | Status: AC

## 2020-11-16 MED ORDER — FENTANYL CITRATE (PF) 100 MCG/2ML IJ SOLN
50.0000 ug | Freq: Once | INTRAMUSCULAR | Status: AC
  Administered 2020-11-16: 50 ug via INTRAVENOUS
  Filled 2020-11-16: qty 2

## 2020-11-16 MED ORDER — ORAL CARE MOUTH RINSE
15.0000 mL | OROMUCOSAL | Status: DC
  Administered 2020-11-16 – 2020-11-21 (×46): 15 mL via OROMUCOSAL

## 2020-11-16 MED ORDER — MIDAZOLAM HCL 2 MG/2ML IJ SOLN
INTRAMUSCULAR | Status: AC
  Administered 2020-11-16: 1 mg via INTRAVENOUS
  Filled 2020-11-16: qty 2

## 2020-11-16 MED ORDER — LACTATED RINGERS IV BOLUS: 1000.0000 mL | Freq: Once | INTRAVENOUS | Status: DC

## 2020-11-16 MED ORDER — FENTANYL CITRATE (PF) 100 MCG/2ML IJ SOLN
25.0000 ug | INTRAMUSCULAR | Status: DC | PRN
  Administered 2020-11-16 (×2): 100 ug via INTRAVENOUS
  Filled 2020-11-16 (×2): qty 2

## 2020-11-16 MED ORDER — NOREPINEPHRINE 4 MG/250ML-% IV SOLN
0.0000 ug/min | INTRAVENOUS | Status: DC
  Filled 2020-11-16: qty 250

## 2020-11-16 MED ORDER — SODIUM BICARBONATE 8.4 % IV SOLN
INTRAVENOUS | Status: AC
  Administered 2020-11-16: 100 meq
  Filled 2020-11-16: qty 50

## 2020-11-16 MED ORDER — NOREPINEPHRINE 16 MG/250ML-% IV SOLN
0.0000 ug/min | INTRAVENOUS | Status: DC
Start: 2020-11-16 — End: 2020-11-21
  Administered 2020-11-16 (×2): 50 ug/min via INTRAVENOUS
  Administered 2020-11-17: 24 ug/min via INTRAVENOUS
  Administered 2020-11-18: 2 ug/min via INTRAVENOUS
  Administered 2020-11-20: 5 ug/min via INTRAVENOUS
  Administered 2020-11-21: 22 ug/min via INTRAVENOUS
  Filled 2020-11-16 (×6): qty 250

## 2020-11-16 MED ORDER — STERILE WATER FOR INJECTION IV SOLN
INTRAVENOUS | Status: DC
  Filled 2020-11-16 (×5): qty 1000

## 2020-11-16 MED ORDER — ROCURONIUM BROMIDE 10 MG/ML (PF) SYRINGE
100.0000 mg | PREFILLED_SYRINGE | Freq: Once | INTRAVENOUS | Status: AC
  Administered 2020-11-16: 100 mg via INTRAVENOUS

## 2020-11-16 NOTE — Progress Notes (Signed)
PCCM INTERVAL PROGRESS NOTE   Patient is now post cardiac arrest and in refractory shock/ refractory acidosis. MAP in the 40s-50s despite norepinephrine at 59mcg. Requiring 100% FiO2 and 14 PEEP on the ventilator.   ABG    Component Value Date/Time   PHART 7.057 (LL) 11/16/2020 1249   PCO2ART 70.8 (HH) 11/16/2020 1249   PO2ART 41 (L) 11/16/2020 1249   HCO3 20.0 11/16/2020 1249   TCO2 22 11/16/2020 1249   ACIDBASEDEF 10.0 (H) 11/16/2020 1249   O2SAT 55.0 11/16/2020 1249   Vent Mode: PRVC FiO2 (%):  [32 %-100 %] 100 % Set Rate:  [16 bmp-34 bmp] 34 bmp Vt Set:  [350 mL] 350 mL PEEP:  [10 [id number] cmH20] 10 cmH20   General:  elderly female on vent Neuro:  Sedated.  HEENT:  [place]/AT, No JVD noted, PERRL Cardiovascular:  RRR, no MRG Lungs:  Diminished on R.  Abdomen:  Soft, non-distended Musculoskeletal:  No acute deformity Skin:  Intact, MMM  Presumably these are all sequale of cardiac arrest secondary to massive aspiration, but no doubt she is at risk for rapidly developing septic shock as well.  CXR demonstrates dense R lung opacification consistent with aspiration and hypoxemia on ABG is consistent as well.   Plan: Starting epinephrine infusion, vasopressin infusion.   Vent adjusted based on above gas: Rate increased. PEEP decreased to 10 as there is concern we are hyperinflating good lung.  Will attempt to place left side down to improve perfusion to good lung. Will consider bronchoscopy if secretions increase. May need to prone depending on response to left lateral decubitus positioning. Central line, arterial line placed.     Georgann Housekeeper, AGACNP-BC [facility]  See Amion for personal pager PCCM on call pager [phone] until 7pm. Please call Elink 7p-7a. [phone]  11/16/2020 12:53 PM

## 2020-11-16 NOTE — Procedures (Signed)
Arterial Catheter Insertion Procedure Note  [name]  [id number]  03-26-1946  Date:11/16/20  Time:11:46 AM    Provider Performing: Corey Harold    Procedure: Insertion of Arterial Line [phone]) with US guidance ([id number])   Indication(s) Blood pressure monitoring and/or need for frequent ABGs  Consent Unable to obtain consent due to emergent nature of procedure.  Anesthesia None   Time Out Verified patient identification, verified procedure, site/side was marked, verified correct patient position, special equipment/implants available, medications/allergies/relevant history reviewed, required imaging and test results available.   Sterile Technique Maximal sterile technique including full sterile barrier drape, hand hygiene, sterile gown, sterile gloves, mask, hair covering, sterile ultrasound probe cover (if used).   Procedure Description Area of catheter insertion was cleaned with chlorhexidine and draped in sterile fashion. With real-time ultrasound guidance an arterial catheter was placed into the left radial artery.  Appropriate arterial tracings confirmed on monitor.     Complications/Tolerance None; patient tolerated the procedure well.   EBL Minimal   Specimen(s) ABG sent   Georgann Housekeeper, AGACNP-BC [facility]  See Amion for personal pager PCCM on call pager [phone] until 7pm. Please call Elink 7p-7a. [phone]  11/16/2020 11:46 AM

## 2020-11-16 NOTE — Progress Notes (Signed)
[facility] Progress Note Patient Name: [name] DOB: June 17, 1945 MRN: [id number]   Date of Service  11/16/2020  HPI/Events of Note  Patient with sub-optimal sedation and ventilator dyssynchrony, however she is on three pressors. Per bedside RN patient was following commands post-arrest.  eICU Interventions  Low dose Versed gtt ordered        Frederik Pear 11/16/2020, 7:59 PM

## 2020-11-16 NOTE — Progress Notes (Signed)
RT NOTE: ETT retracted 2 cm to 22 at the lips per CCM per chest xray.

## 2020-11-16 NOTE — Progress Notes (Signed)
Patient ID: [name], female   DOB: Jul 16, 1945, 75 y.o.   MRN: [id number] [facility] Surgery Progress Note:     Subjective: Pt denies pain.  Remains a bit tachypneic.    Objective: Vital signs in last 24 hours: Temp:  [97.5 F (36.4 C)-98.9 F (37.2 C)] 98.4 F (36.9 C) (07/20 0753) Pulse Rate:  [76-117] 100 (07/20 0800) Resp:  [21-42] 28 (07/20 0726) BP: (79-168)/(20-155) 156/57 (07/20 0726) SpO2:  [73 %-100 %] 100 % (07/20 0726) FiO2 (%):  [32 %-36 %] 32 % (07/20 0726) Weight:  [97.1 kg] 97.1 kg (07/20 0300)  Intake/Output from previous day: 07/19 0701 - 07/20 0700 In: 4037.6 [I.V.:2806.6; IV Piggyback:1231] Out: 2400 [Urine:2050; Emesis/NG output:350] Intake/Output this shift: Total I/O In: 10 [I.V.:10] Out: -    Physical Exam:  Alert and answering questions/following commands. Work of breathing is not labored, though slightly tachypneic.   CV regular rate and rhythm Abd soft, non tender, non distended.  Incisions healing well without evidence of infection.     Lab Results:  Results for orders placed or performed during the hospital encounter of 10/12/2020 (from the past 48 hour(s))  Culture, blood (routine x 2)     Status: None (Preliminary result)   Collection Time: 11/14/20  9:36 AM   Specimen: BLOOD  Result Value Ref Range   Specimen Description BLOOD RIGHT ANTECUBITAL    Special Requests      BOTTLES DRAWN AEROBIC AND ANAEROBIC Blood Culture results may not be optimal due to an excessive volume of blood received in culture bottles   Culture      NO GROWTH 2 DAYS Performed at [facility] [address], [place], [place] [address]    Report Status PENDING   Culture, blood (routine x 2)     Status: None (Preliminary result)   Collection Time: 11/14/20  9:36 AM   Specimen: BLOOD RIGHT HAND  Result Value Ref Range   Specimen Description BLOOD RIGHT HAND    Special Requests      BOTTLES DRAWN AEROBIC AND ANAEROBIC Blood Culture results  may not be optimal due to an excessive volume of blood received in culture bottles   Culture      NO GROWTH 2 DAYS Performed at [facility], [place] [address] [place], [place] [address]    Report Status PENDING   Glucose, capillary     Status: Abnormal   Collection Time: 11/14/20 11:49 AM  Result Value Ref Range   Glucose-Capillary 230 (H) 70 - 99 mg/dL    Comment: Glucose reference range applies only to samples taken after fasting for at least 8 hours.  Glucose, capillary     Status: Abnormal   Collection Time: 11/14/20  4:21 PM  Result Value Ref Range   Glucose-Capillary 175 (H) 70 - 99 mg/dL    Comment: Glucose reference range applies only to samples taken after fasting for at least 8 hours.  Glucose, capillary     Status: Abnormal   Collection Time: 11/14/20  6:24 PM  Result Value Ref Range   Glucose-Capillary 183 (H) 70 - 99 mg/dL    Comment: Glucose reference range applies only to samples taken after fasting for at least 8 hours.  Urine Culture     Status: None (Preliminary result)   Collection Time: 11/14/20  6:54 PM   Specimen: Urine, Clean Catch  Result Value Ref Range   Specimen Description URINE, CLEAN CATCH    Special Requests NONE  Culture      CULTURE REINCUBATED FOR BETTER GROWTH Performed at [facility], [place] [address], [place], [place] [address]    Report Status PENDING   Glucose, capillary     Status: Abnormal   Collection Time: 11/14/20  7:26 PM  Result Value Ref Range   Glucose-Capillary 213 (H) 70 - 99 mg/dL    Comment: Glucose reference range applies only to samples taken after fasting for at least 8 hours.  Lactic acid, plasma     Status: None   Collection Time: 11/14/20  7:43 PM  Result Value Ref Range   Lactic Acid, Venous 1.3 0.5 - 1.9 mmol/L    Comment: Performed at [facility] [address] [place], [place] [address]  Procalcitonin - Baseline     Status: None   Collection Time: 11/14/20  7:43 PM  Result Value Ref Range    Procalcitonin 3.04 ng/mL    Comment:        Interpretation: PCT > 2 ng/mL: Systemic infection (sepsis) is likely, unless other causes are known. (NOTE)       Sepsis PCT Algorithm           Lower Respiratory Tract                                      Infection PCT Algorithm    ----------------------------     ----------------------------         PCT < 0.25 ng/mL                PCT < 0.10 ng/mL          Strongly encourage             Strongly discourage   discontinuation of antibiotics    initiation of antibiotics    ----------------------------     -----------------------------       PCT 0.25 - 0.50 ng/mL            PCT 0.10 - 0.25 ng/mL               OR       >80% decrease in PCT            Discourage initiation of                                            antibiotics      Encourage discontinuation           of antibiotics    ----------------------------     -----------------------------         PCT >= 0.50 ng/mL              PCT 0.26 - 0.50 ng/mL               AND       <80% decrease in PCT              Encourage initiation of                                             antibiotics       Encourage continuation  of antibiotics    ----------------------------     -----------------------------        PCT >= 0.50 ng/mL                  PCT > 0.50 ng/mL               AND         increase in PCT                  Strongly encourage                                      initiation of antibiotics    Strongly encourage escalation           of antibiotics                                     -----------------------------                                           PCT <= 0.25 ng/mL                                                 OR                                        > 80% decrease in PCT                                      Discontinue / Do not initiate                                             antibiotics  Performed at [facility], [address]  [place], [place] [address]   Vitamin B12     Status: Abnormal   Collection Time: 11/14/20  7:43 PM  Result Value Ref Range   Vitamin B-12 1,132 (H) 180 - 914 pg/mL    Comment: (NOTE) This assay is not validated for testing neonatal or myeloproliferative syndrome specimens for Vitamin B12 levels. Performed at [facility], [place] [address] [place], [place] [address]   I-STAT 7, (LYTES, BLD GAS, ICA, H+H)     Status: Abnormal   Collection Time: 11/14/20  9:10 PM  Result Value Ref Range   pH, Arterial 7.316 (L) 7.350 - 7.450   pCO2 arterial 37.2 32.0 - 48.0 mmHg   pO2, Arterial 152 (H) 83.0 - 108.0 mmHg   Bicarbonate 19.0 (L) 20.0 - 28.0 mmol/L   TCO2 20 (L) 22 - 32 mmol/L   O2 Saturation 99.0 %   Acid-base deficit 7.0 (H) 0.0 - 2.0 mmol/L   Sodium 148 (H) 135 - 145 mmol/L   Potassium 4.4 3.5 - 5.1 mmol/L  Calcium, Ion 1.30 1.15 - 1.40 mmol/L   HCT 24.0 (L) 36.0 - 46.0 %   Hemoglobin 8.2 (L) 12.0 - 15.0 g/dL   Patient temperature 98.7 F    Sample type ARTERIAL   I-STAT 7, (LYTES, BLD GAS, ICA, H+H)     Status: Abnormal   Collection Time: 11/14/20  9:48 PM  Result Value Ref Range   pH, Arterial 7.330 (L) 7.350 - 7.450   pCO2 arterial 33.8 32.0 - 48.0 mmHg   pO2, Arterial 65 (L) 83.0 - 108.0 mmHg   Bicarbonate 17.8 (L) 20.0 - 28.0 mmol/L   TCO2 19 (L) 22 - 32 mmol/L   O2 Saturation 91.0 %   Acid-base deficit 7.0 (H) 0.0 - 2.0 mmol/L   Sodium 147 (H) 135 - 145 mmol/L   Potassium 4.4 3.5 - 5.1 mmol/L   Calcium, Ion 1.31 1.15 - 1.40 mmol/L   HCT 24.0 (L) 36.0 - 46.0 %   Hemoglobin 8.2 (L) 12.0 - 15.0 g/dL   Patient temperature 98.7 F    Sample type ARTERIAL   Glucose, capillary     Status: Abnormal   Collection Time: 11/14/20 11:46 PM  Result Value Ref Range   Glucose-Capillary 195 (H) 70 - 99 mg/dL    Comment: Glucose reference range applies only to samples taken after fasting for at least 8 hours.  CBC with Differential/Platelet     Status: Abnormal   Collection Time:  11/15/20  1:24 AM  Result Value Ref Range   WBC 24.6 (H) 4.0 - 10.5 K/uL   RBC 2.38 (L) 3.87 - 5.11 MIL/uL   Hemoglobin 7.8 (L) 12.0 - 15.0 g/dL   HCT 26.4 (L) 36.0 - 46.0 %   MCV 110.9 (H) 80.0 - 100.0 fL    Comment: REPEATED TO VERIFY DELTA CHECK NOTED    MCH 32.8 26.0 - 34.0 pg   MCHC 29.5 (L) 30.0 - 36.0 g/dL   RDW 18.3 (H) 11.5 - 15.5 %   Platelets 232 150 - 400 K/uL   nRBC 0.0 0.0 - 0.2 %   Neutrophils Relative % 85 %   Neutro Abs 20.8 (H) 1.7 - 7.7 K/uL   Lymphocytes Relative 5 %   Lymphs Abs 1.3 0.7 - 4.0 K/uL   Monocytes Relative 6 %   Monocytes Absolute 1.5 (H) 0.1 - 1.0 K/uL   Eosinophils Relative 0 %   Eosinophils Absolute 0.0 0.0 - 0.5 K/uL   Basophils Relative 0 %   Basophils Absolute 0.0 0.0 - 0.1 K/uL   Immature Granulocytes 4 %   Abs Immature Granulocytes 0.88 (H) 0.00 - 0.07 K/uL    Comment: Performed at [facility], [address], [place], [place] [address]  Glucose, capillary     Status: Abnormal   Collection Time: 11/15/20  3:44 AM  Result Value Ref Range   Glucose-Capillary 151 (H) 70 - 99 mg/dL    Comment: Glucose reference range applies only to samples taken after fasting for at least 8 hours.  Comprehensive metabolic panel     Status: Abnormal   Collection Time: 11/15/20  5:45 AM  Result Value Ref Range   Sodium 145 135 - 145 mmol/L   Potassium 4.2 3.5 - 5.1 mmol/L   Chloride 118 (H) 98 - 111 mmol/L   CO2 20 (L) 22 - 32 mmol/L   Glucose, Bld 190 (H) 70 - 99 mg/dL    Comment: Glucose reference range applies only to samples taken after fasting for at  least 8 hours.   BUN 65 (H) 8 - 23 mg/dL   Creatinine, Ser 1.71 (H) 0.44 - 1.00 mg/dL   Calcium 8.5 (L) 8.9 - 10.3 mg/dL   Total Protein 5.9 (L) 6.5 - 8.1 g/dL   Albumin 1.6 (L) 3.5 - 5.0 g/dL   AST 26 15 - 41 U/L   ALT 21 0 - 44 U/L   Alkaline Phosphatase 70 38 - 126 U/L   Total Bilirubin 2.0 (H) 0.3 - 1.2 mg/dL   GFR, Estimated 30 (L) >60 mL/min    Comment: (NOTE) Calculated using the  CKD-EPI Creatinine Equation (2021)    Anion gap 7 5 - 15    Comment: Performed at [facility], [place] [address], [place], [place] [address]  Magnesium     Status: None   Collection Time: 11/15/20  5:45 AM  Result Value Ref Range   Magnesium 2.3 1.7 - 2.4 mg/dL    Comment: Performed at [facility] [address], [place], [place] [address]  Procalcitonin     Status: None   Collection Time: 11/15/20  5:45 AM  Result Value Ref Range   Procalcitonin 3.39 ng/mL    Comment:        Interpretation: PCT > 2 ng/mL: Systemic infection (sepsis) is likely, unless other causes are known. (NOTE)       Sepsis PCT Algorithm           Lower Respiratory Tract                                      Infection PCT Algorithm    ----------------------------     ----------------------------         PCT < 0.25 ng/mL                PCT < 0.10 ng/mL          Strongly encourage             Strongly discourage   discontinuation of antibiotics    initiation of antibiotics    ----------------------------     -----------------------------       PCT 0.25 - 0.50 ng/mL            PCT 0.10 - 0.25 ng/mL               OR       >80% decrease in PCT            Discourage initiation of                                            antibiotics      Encourage discontinuation           of antibiotics    ----------------------------     -----------------------------         PCT >= 0.50 ng/mL              PCT 0.26 - 0.50 ng/mL               AND       <80% decrease in PCT              Encourage initiation of  antibiotics       Encourage continuation           of antibiotics    ----------------------------     -----------------------------        PCT >= 0.50 ng/mL                  PCT > 0.50 ng/mL               AND         increase in PCT                  Strongly encourage                                      initiation of antibiotics    Strongly encourage escalation            of antibiotics                                     -----------------------------                                           PCT <= 0.25 ng/mL                                                 OR                                        > 80% decrease in PCT                                      Discontinue / Do not initiate                                             antibiotics  Performed at [facility], [address], [place], [place] [address]   Phosphorus     Status: None   Collection Time: 11/15/20  5:45 AM  Result Value Ref Range   Phosphorus 4.0 2.5 - 4.6 mg/dL    Comment: Performed at [facility] [address], [place], [place] [address]  Glucose, capillary     Status: Abnormal   Collection Time: 11/15/20  7:53 AM  Result Value Ref Range   Glucose-Capillary 203 (H) 70 - 99 mg/dL    Comment: Glucose reference range applies only to samples taken after fasting for at least 8 hours.  Glucose, capillary     Status: Abnormal   Collection Time: 11/15/20 11:11 AM  Result Value Ref Range   Glucose-Capillary 201 (H) 70 - 99 mg/dL    Comment: Glucose reference range applies only to samples taken after fasting for at least 8 hours.   Comment 1 Notify RN   Glucose, capillary  Status: Abnormal   Collection Time: 11/15/20  4:27 PM  Result Value Ref Range   Glucose-Capillary 184 (H) 70 - 99 mg/dL    Comment: Glucose reference range applies only to samples taken after fasting for at least 8 hours.  MRSA Next Gen by PCR, Nasal     Status: None   Collection Time: 11/15/20  5:24 PM   Specimen: Nasal Mucosa; Nasal Swab  Result Value Ref Range   MRSA by PCR Next Gen NOT DETECTED NOT DETECTED    Comment: (NOTE) The GeneXpert MRSA Assay (FDA approved for NASAL specimens only), is one component of a comprehensive MRSA colonization surveillance program. It is not intended to diagnose MRSA infection nor to guide or monitor treatment for MRSA infections. Test performance  is not FDA approved in patients less than 90 years old. Performed at [facility], [place] [address], [place], [place] [address]   Glucose, capillary     Status: Abnormal   Collection Time: 11/15/20  7:35 PM  Result Value Ref Range   Glucose-Capillary 155 (H) 70 - 99 mg/dL    Comment: Glucose reference range applies only to samples taken after fasting for at least 8 hours.  Glucose, capillary     Status: Abnormal   Collection Time: 11/15/20 11:27 PM  Result Value Ref Range   Glucose-Capillary 161 (H) 70 - 99 mg/dL    Comment: Glucose reference range applies only to samples taken after fasting for at least 8 hours.  Glucose, capillary     Status: Abnormal   Collection Time: 11/16/20  3:43 AM  Result Value Ref Range   Glucose-Capillary 160 (H) 70 - 99 mg/dL    Comment: Glucose reference range applies only to samples taken after fasting for at least 8 hours.  Procalcitonin     Status: None   Collection Time: 11/16/20  6:35 AM  Result Value Ref Range   Procalcitonin 2.95 ng/mL    Comment:        Interpretation: PCT > 2 ng/mL: Systemic infection (sepsis) is likely, unless other causes are known. (NOTE)       Sepsis PCT Algorithm           Lower Respiratory Tract                                      Infection PCT Algorithm    ----------------------------     ----------------------------         PCT < 0.25 ng/mL                PCT < 0.10 ng/mL          Strongly encourage             Strongly discourage   discontinuation of antibiotics    initiation of antibiotics    ----------------------------     -----------------------------       PCT 0.25 - 0.50 ng/mL            PCT 0.10 - 0.25 ng/mL               OR       >80% decrease in PCT            Discourage initiation of  antibiotics      Encourage discontinuation           of antibiotics    ----------------------------     -----------------------------         PCT >= 0.50 ng/mL               PCT 0.26 - 0.50 ng/mL               AND       <80% decrease in PCT              Encourage initiation of                                             antibiotics       Encourage continuation           of antibiotics    ----------------------------     -----------------------------        PCT >= 0.50 ng/mL                  PCT > 0.50 ng/mL               AND         increase in PCT                  Strongly encourage                                      initiation of antibiotics    Strongly encourage escalation           of antibiotics                                     -----------------------------                                           PCT <= 0.25 ng/mL                                                 OR                                        > 80% decrease in PCT                                      Discontinue / Do not initiate                                             antibiotics  Performed at [facility], [place] [address], [place], [place] [address]   Basic metabolic panel     Status: Abnormal  Collection Time: 11/16/20  6:35 AM  Result Value Ref Range   Sodium 141 135 - 145 mmol/L   Potassium 3.6 3.5 - 5.1 mmol/L   Chloride 115 (H) 98 - 111 mmol/L   CO2 18 (L) 22 - 32 mmol/L   Glucose, Bld 174 (H) 70 - 99 mg/dL    Comment: Glucose reference range applies only to samples taken after fasting for at least 8 hours.   BUN 62 (H) 8 - 23 mg/dL   Creatinine, Ser 1.51 (H) 0.44 - 1.00 mg/dL   Calcium 9.0 8.9 - 10.3 mg/dL   GFR, Estimated 35 (L) >60 mL/min    Comment: (NOTE) Calculated using the CKD-EPI Creatinine Equation (2021)    Anion gap 8 5 - 15    Comment: Performed at [facility] [address] [place], [place] [address]  Magnesium     Status: None   Collection Time: 11/16/20  6:35 AM  Result Value Ref Range   Magnesium 2.1 1.7 - 2.4 mg/dL    Comment: Performed at [facility] [address] [place], [place] [address]  Phosphorus     Status:  None   Collection Time: 11/16/20  6:35 AM  Result Value Ref Range   Phosphorus 3.1 2.5 - 4.6 mg/dL    Comment: Performed at [facility] [address], [place], [place] [address]  Glucose, capillary     Status: Abnormal   Collection Time: 11/16/20  7:49 AM  Result Value Ref Range   Glucose-Capillary 165 (H) 70 - 99 mg/dL    Comment: Glucose reference range applies only to samples taken after fasting for at least 8 hours.    Radiology/Results: DG CHEST PORT 1 VIEW  Result Date: 11/15/2020 CLINICAL DATA:  Shortness of breath. Recent gastrostomy. History of respiratory failure. EXAM: PORTABLE CHEST 1 VIEW COMPARISON:  11/14/2020. FINDINGS: Interim removal of feeding tube. NG tube and left PICC line in stable position. Severe cardiomegaly again noted. Mild bilateral increase interstitial prominence noted suggesting interstitial edema. Pneumonitis cannot be excluded. Mild atelectatic changes right mid lung again noted. Small left pleural effusion noted on today's exam. No pneumothorax. Prior cervical spine fusion. IMPRESSION: 1. Interim removal of feeding tube. NG tube and left PICC line stable position. 2. Severe cardiomegaly again noted. Mild bilateral increase interstitial prominence noted suggesting interstitial edema. Pneumonitis cannot be excluded. Small left pleural effusion noted on today's exam Electronically Signed   By: Marcello Moores  Register   On: 11/15/2020 06:24   DG CHEST PORT 1 VIEW  Result Date: 11/14/2020 CLINICAL DATA:  Leukocytosis, history CHF, hypertension, stroke, on BiPAP EXAM: PORTABLE CHEST 1 VIEW COMPARISON:  Portable exam 0915 hours compared to 11/04/2020 FINDINGS: Nasogastric tube and feeding tube extends into stomach. Enlargement of cardiac silhouette with pulmonary vascular congestion. Atherosclerotic calcification aorta. Mild pulmonary infiltrates likely reflecting pulmonary edema. Subsegmental atelectasis RIGHT upper lobe. No pleural effusion or pneumothorax. Bones  demineralized with evidence of prior cervical spine fusion. IMPRESSION: Enlargement of cardiac silhouette with pulmonary vascular congestion and minimal pulmonary edema. Subsegmental atelectasis RIGHT upper lobe unchanged. Aortic Atherosclerosis (ICD10-I70.0). Electronically Signed   By: Lavonia Dana M.D.   On: 11/14/2020 11:21    Anti-infectives: Anti-infectives (From admission, onward)    Start     Dose/Rate Route Frequency Ordered Stop   11/16/20 1000  ceFEPIme (MAXIPIME) 2 g in sodium chloride 0.9 % 100 mL IVPB        2 g 200 mL/hr over 30  Minutes Intravenous Every 12 hours 11/16/20 0746     11/15/20 2000  vancomycin (VANCOCIN) IVPB 1000 mg/200 mL premix  Status:  Discontinued        1,000 mg 200 mL/hr over 60 Minutes Intravenous Every 24 hours 11/15/20 0827 11/16/20 0759   11/15/20 1800  ceFEPIme (MAXIPIME) 2 g in sodium chloride 0.9 % 100 mL IVPB  Status:  Discontinued        2 g 200 mL/hr over 30 Minutes Intravenous Every 24 hours 11/15/20 0827 11/16/20 0746   11/14/20 1945  ceFEPIme (MAXIPIME) 2 g in sodium chloride 0.9 % 100 mL IVPB  Status:  Discontinued        2 g 200 mL/hr over 30 Minutes Intravenous Every 12 hours 11/14/20 1853 11/15/20 0827   11/14/20 1945  vancomycin (VANCOCIN) IVPB 1000 mg/200 mL premix  Status:  Discontinued        1,000 mg 200 mL/hr over 60 Minutes Intravenous Every 24 hours 11/14/20 1855 11/15/20 0827   11/14/20 1615  cefTRIAXone (ROCEPHIN) 2 g in sodium chloride 0.9 % 100 mL IVPB  Status:  Discontinued        2 g 200 mL/hr over 30 Minutes Intravenous Every 24 hours 11/14/20 1521 11/14/20 1802   11/14/20 1615  metroNIDAZOLE (FLAGYL) IVPB 500 mg        500 mg 100 mL/hr over 60 Minutes Intravenous Every 8 hours 11/14/20 1521     11/04/20 1245  cefTRIAXone (ROCEPHIN) 2 g in sodium chloride 0.9 % 100 mL IVPB        2 g 200 mL/hr over 30 Minutes Intravenous Every 24 hours 11/04/20 1148 11/06/20 1302   11/02/20 1000  cefTRIAXone (ROCEPHIN) 2 g in sodium  chloride 0.9 % 100 mL IVPB  Status:  Discontinued        2 g 200 mL/hr over 30 Minutes Intravenous Every 24 hours 11/01/20 1257 11/02/20 0856   11/02/20 1000  doxycycline (VIBRAMYCIN) 100 mg in sodium chloride 0.9 % 250 mL IVPB  Status:  Discontinued        100 mg 125 mL/hr over 120 Minutes Intravenous Every 12 hours 11/01/20 1257 11/02/20 0856   11/02/20 1000  ceFEPIme (MAXIPIME) 2 g in sodium chloride 0.9 % 100 mL IVPB  Status:  Discontinued        2 g 200 mL/hr over 30 Minutes Intravenous Every 12 hours 11/02/20 0856 11/04/20 1148   11/01/20 0915  levofloxacin (LEVAQUIN) IVPB 750 mg  Status:  Discontinued        750 mg 100 mL/hr over 90 Minutes Intravenous Every 24 hours 11/01/20 0827 11/01/20 1257   11/01/20 0730  levofloxacin (LEVAQUIN) IVPB 500 mg  Status:  Discontinued        500 mg 100 mL/hr over 60 Minutes Intravenous Every 24 hours 11/01/20 0631 11/01/20 1257   10/10/2020 2100  ciprofloxacin (CIPRO) IVPB 400 mg        400 mg 200 mL/hr over 60 Minutes Intravenous Every 12 hours 10/24/2020 1258 10/09/2020 2148   10/09/2020 0600  ciprofloxacin (CIPRO) IVPB 400 mg        400 mg 200 mL/hr over 60 Minutes Intravenous On call to O.R. 10/13/2020 0547 10/07/2020 0847       Assessment/Plan: Problem List: Patient Active Problem List   Diagnosis Date Noted   Pressure injury of skin 11/04/2020   Cardiac arrest (HCC)    Emesis    Respiratory failure (HCC)    SOB (shortness of breath)  Acute respiratory failure with hypoxemia ([place])    Encounter for central line placement    S/P partial gastrectomy 10/05/2020   Type 2 diabetes mellitus without complication ([place]) 96/07/5407   Hyperlipidemia 03/01/2014   Focal seizures ([place]) 10/21/2013   TIA (transient ischemic attack) 10/21/2013   CHEST PAIN 01/24/2010   BRADYCARDIA 12/23/2009   Diabetes ([place]) 08/04/2008   HYPOTHYROIDISM 03/06/2007   OBESITY 03/06/2007   OBSTRUCTIVE SLEEP APNEA 03/06/2007   HYPERTENSION, BENIGN 03/06/2007   CORONARY  ARTERY DISEASE 03/06/2007   ALLERGIC RHINITIS 03/06/2007   DEGENERATIVE JOINT DISEASE, GENERALIZED 03/06/2007   BACK PAIN, CHRONIC 03/06/2007    Efferent limb obstruction.   Will likely need to be revised, but patient currently too sick for this. Will figure out target time once nutrition and respiratory status a bit improved.    TNA for severe protein calorie malnutrition and intolerance of enteric feeding. AKI again, but improved from yesterday.   25% albumin boluses for 48 hours to minimize fluid effect on lungs.    OOB today/pulmonary toilet. Pt still looks tenuous.  Will leave in ICU.     LOS: 22 days   Milus Height, MD FACS Surgical Oncology, General Surgery, Trauma and [place] [facility], [place] for weekday/non holidays Check [email or web address] for coverage night/weekend/holidays  Do not use SecureChat as it is not reliable for timely patient care.     11/16/2020 9:11 AM

## 2020-11-16 NOTE — Progress Notes (Signed)
Pt's head turned and arms rotated at this time. Pt's neck is stiff turned pt's head turned to the right as much as possible. Pt tolerated well. ETT secured.

## 2020-11-16 NOTE — Code Documentation (Signed)
Ms. Benecke gagged and aspirated during NGT placement and developed asystole quickly. Coed blue called, compressions started, was moved back to bed. ACLS meds administered. All pulse checks PEA. Epi, bicarb, dextrose given during resuscitation. Intubated successfully. ROSC achieved. Arterial line being placed. BP currently elevated, tachycardic. Surgery updated, husband asked to return to the hospital given change in status. See separate code documentation.  Julian Hy, DO 11/16/20 11:31 AM [facility]

## 2020-11-16 NOTE — Progress Notes (Signed)
PHARMACY - TOTAL PARENTERAL NUTRITION CONSULT NOTE  Indication: Partial gastrectomy and TF intolerance   Patient Measurements: Height: 4\' 11"  (149.9 cm) Weight: 97.1 kg (214 lb 1.1 oz) IBW/kg (Calculated) : 43.2 TPN AdjBW (KG): 56.1 Body mass index is 43.24 kg/m. Usual Weight: 95 kg  Assessment:  75 yo W admitted for 6/28 elective distal gastrectomy for malignant polyp. Patient had a 6 minute code on 7/5 with severe respiratory failure and shock. Patient had an ileus and presumed functional gastric outlet obstruction post-op.  Patient tolerated TF 7/5-7/7 at 20 ml/hr, 7/7- 7/8 at 30 ml/hr, 7/9-7/10 at 40 ml/hr (goal 55 ml/hr). TF were held due to access issues then restarted 7/12 at 20-30 ml/hr. TF were stopped 7/13 for large volume bilious emesis that does not look like TF. Weight unchanged from usual but had anasarca earlier this admit, now resolved. Concern of efferent limb obstruction - likely plan for revision once stabilized.   Glucose / Insulin: hx DM on glipizide PTA - CBGs elevated post TPN initiation. CBGs 155-201 - Received 45 units insulin in past 24 hrs + 60 units in TPN - Started on D5 @ 50 ml/hr for hypernatremia (resolved) and continued for hyperchloremia Electrolytes: Na 141 (on D5, Na removed in TPN on 7/16), Corr Ca 10.9 (Ca resumed in TPN on 7/18, reduced 7/19), Cl 115, CO2 18. Phos 3.1. Other electrolytes wnl.  Renal:  Scr 1.51 (BL 1.0 - 1.1). BUN 62 GI/Hepatic: LFTs wnl. Tbili 2.0 (sclera jaundice on 7/15 and no jaundice present 7/19 per RN). Albumin 1.6. Prealbumin 7.3. TG wnl.  Intake / Output; MIVF:  UOP 0.9 ml/kg/hr. NG 350 ml. LBM x2 on 7/16 5.8 L pos.  Pertinent GI Imaging: 7/11 CT - wall thickening at [place] anastomosis, no evidence of outlet obstruction 7/12 Cortrak advanced to small bowel 7/19 CT abdomen pelvis - ordered GI Surgeries / Procedures:  6/28 elective distal gastrectomy 7/15 EGD - normal esophagus, jejunal stenosis, antrectomy found  Central  access: triple IJ placed 11/01/20, PICC placed 11/10/20 TPN start date: 11/10/20  Nutritional Goals (per RD rec on 7/18): kCal: 1800-2000, Protein: 100-120, Fluid: >/= 1.8 L/d  Current Nutrition:  TPN Off TF 7/13  Plan:  Continue TPN at goal rate 75 ml/hr to provide 108g AA, 252g CHO and 54g ILE for a total of 1829 kCal, meeting 100% of patient needs Electrolytes in TPN: Increase K to 50 mEq/L, remove Ca. Continue Na 0 mEq/L, Mg 4 mEq/L, Phos 10 mmol/L. Continue max acetate.  Continue D5W and increase insulin to 4 units q4h per MD Continue resistant SSI Q4H and continue regular insulin in TPN at 60 units Add standard MVI and continue full amount trace elements since no jaundice present  Standard TPN labs on Mon/Thurs  Cristela Felt, PharmD, BCPS Clinical Pharmacist 11/16/2020 7:06 AM

## 2020-11-16 NOTE — Progress Notes (Signed)
Inpatient Rehab Admissions Coordinator:   Pt. On vent, not appropriate for CIR at this time. MD may re-consult if Pt. Becomes medically stable and appropriate for rehab, but CIR will sign off for now.   Clemens Catholic, [place], [place] Admissions Coordinator  [phone] ([place]) [phone] (office)

## 2020-11-16 NOTE — Progress Notes (Signed)
At 1105 Pt seated in chair. After educating Pt on procedure and purpose of NG placement and pt agreed to procedure, this nurse attempted to insert NG tube. Pt began coughing and had suspected aspiration. Pt decompensated rapidly becoming unresponsive with cardiac arrest. Code called and Chest compressions started immediately. Pt transferred back to bed and ACLS protocols started. See code sheet for documentation.

## 2020-11-16 NOTE — Progress Notes (Signed)
   11/16/20 1107  Clinical Encounter Type  Visited With Family  Visit Type Initial;Code  Referral From Nurse  Consult/Referral To Chaplain  Spiritual Encounters  Spiritual Needs Emotional   Chaplain responded to Code Blue. Chaplain waited for Pt's husband to arrive. Chaplain engaged active listening and provided emotional and spiritual support. Chaplain kept Pt's husband company until Pt's son arrived. Chaplain remains available as needed.   This note was prepared by Chaplain Resident, Dante Gang, MDiv. Chaplain remains available as needed through the on-call pager: [phone].

## 2020-11-16 NOTE — Progress Notes (Addendum)
Pharmacy Antibiotic Note  [name] is a 75 y.o. female admitted on 10/22/2020 with sepsis now post arrest and likely aspiration.  Pharmacy has been consulted for Vanc and Cefepime dosing.   Cr improved slightly, MRSA PCR negative.  Plan: -Stop vancomycin -Adjust cefepime to 2g IV q12h -Follow Cr closely   Height: 4\' 11"  (149.9 cm) Weight: 97.1 kg (214 lb 1.1 oz) IBW/kg (Calculated) : 43.2  Temp (24hrs), Avg:98.3 F (36.8 C), Min:97.5 F (36.4 C), Max:98.9 F (37.2 C)  Recent Labs  Lab 11/07/2020 0022 11/12/20 0242 11/13/20 0025 11/14/20 0634 11/14/20 1943 11/15/20 0124 11/15/20 0545 11/16/20 0635  WBC 12.5* 19.6* 19.6* 28.7*  --  24.6*  --   --   CREATININE 1.06* 1.05* 1.02* 1.12*  --   --  1.71* 1.51*  LATICACIDVEN  --   --   --   --  1.3  --   --   --      Estimated Creatinine Clearance: 31.9 mL/min (A) (by C-G formula based on SCr of 1.51 mg/dL (H)).    Allergies  Allergen Reactions   Codeine Itching   Meperidine Hcl Itching   Morphine Itching   Nylon Other (See Comments)    Stitches cause infection   Penicillins Hives, Itching and Rash    Tolerated cefepime and ceftriaxone Has patient had a PCN reaction causing immediate rash, facial/tongue/throat swelling, SOB or lightheadedness with hypotension: Yes Has patient had a PCN reaction causing severe rash involving mucus membranes or skin necrosis: Yes Has patient had a PCN reaction that required hospitalization: No Has patient had a PCN reaction occurring within the last 10 years: No If all of the above answers are "NO", then may proceed with Cephalosporin use.     Antimicrobials this admission: Vanc 7/18 >>  Cefep 7/18 >>   Thank you for allowing pharmacy to be a part of this patient's care.   Arrie Senate, PharmD, BCPS, [facility] Clinical Pharmacist [phone] Please check AMION for all [place] numbers 11/16/2020

## 2020-11-16 NOTE — Progress Notes (Signed)
NAME:  [name], MRN:  [id number], DOB:  June 09, 1945, LOS: 38 ADMISSION DATE:  10/17/2020, CONSULTATION DATE:  7/5 REFERRING MD:  Barry Dienes, CHIEF COMPLAINT:  Cardiac arrest   History of Present Illness:  [name] is a 75 y.o. 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 She had procedure done 6/28.  Pathology was negative for malignancy but showed LGD with negative nodes.   Early AM 7/5, she had wheezing so received a breathing treatment.  Shortly thereafter, she was found with emesis all over bed and floor.  She had gurgling sounds and altered LOC.  She ended up having loss of pulses with initial rhythm asystole.  She received 1mg  epinephrine before PEA for which she received a 2nd dose of epinephrine.  She then had ROSC after 6 minutes.  She was intubated during the code.   She was transferred to [facility] ICU where PCCM was asked to assist with vent management.  Pertinent  Medical History  OSA DM2 Hypertension CAD Allergic rhinitis  Significant Hospital Events: Including procedures, antibiotic start and stop dates in addition to other pertinent events   6/28 admit, robotic distal gastrectomy,  7/5 Six minute code (asystole then PEA), severe hypoxemic respiratory failure and shock 7/5 levaquin x1 7/5 ceftriaxone > 7/6 7/5 doxycycline > 7/6 7/6 cefepime >  7/6 EEG > generalized slowing suggestive of severe diffuse encephalopathy non-specific in etiology  7/6 TTE > LVEF 65-70%, grade II diastolic dysfunction, RSVP elevated 58.1 mmHg, trivial MR 7/12 extubated 7/13 To PCU, [facility] 7/18 PCCM called back for respiratory distress, moved to ICU for increased oxygen requirements. Temporarily was on bipap.  Interim History / Subjective:  Pulled NGT out this morning. She denies complaints otherwise.  Objective   Blood pressure (!) 156/57,  pulse 95, temperature (!) 97.5 F (36.4 C), temperature source Oral, resp. rate (!) 28, height 4\' 11"  (1.499 m), weight 97.1 kg, SpO2 100 %.    FiO2 (%):  [32 %-36 %] 32 %   Intake/Output Summary (Last 24 hours) at 11/16/2020 0730 Last data filed at 11/16/2020 0437 Gross per 24 hour  Intake 3612.03 ml  Output 2200 ml  Net 1412.03 ml    Filed Weights   11/12/20 0458 11/14/20 0500 11/16/20 0300  Weight: 95.2 kg 98.8 kg 97.1 kg    Examination: General: ill appearing woman lying in bed in NAD HEENT: [place]/AT, eyes anicteric Neuro: awake, alert, globally weak CV: S1S2, RRR, NSR on tele. PULM: tachypneic around 30, no rhonchi, no accessory muscle use. GI: abd soft, NT. Dressings over laparoscopy incisions. Extremities: minimal edema, no cyanosis or clubbing Skin: no rashes.  CMP notable for bicarb 18, AG 8, BUN 62, Cr 1.51 7/18 urine culture pending 7/18 blood culture [facility] Problem list   Septic shock secondary to aspiration PNA Cardiac Arrest secondary to Aspiration  AKI  Hyperkalemia   Assessment & Plan:    Post-Op Gastrectomy  Concern for Ileus (7/18) -now more likely efferent limb obstruction.  -continue TPN  -Remain NPO NGT to LIWS; need to replace NGT -repeat CT ABD/Pelvis ordered but not yet performed> defer to surgery if this is still needed  Acute Hypoxemic Respiratory Failure, worsened on 7/18 after aspiration episode & PEA arrest Hx OSA / Probable OHS Likely left lower lobe pneumonia versus aspiration pneumonitis Acute tachypnea thought related to [place] with appropriate compensation by [place].  She previously had an aspiration PNA and was treated with rocephin, required MV / extubated 7/12, and now has severe deconditioning.  Likely underlying OHS given pCO2 on ABG. -Con't supplemental O2 as required to maintain SpO2>90%.  -Agree with prioritizing nutrition, need to also address mobility as respiratory muscle weakness and deconditioning are likely  contributing to her poor respiratory status.   Sepsis due to LLL aspiration pneumonia  -Continue broad antibiotic coverage with cefepime, vanc, flagyl to cover for hospital-acquired organisms as well as anaerobes.  NGMA secondary to Hyperchloremic Acidosis. Renal failure may be contributing to chloride causing low bicarb.  Hypernatremia resolved -Checking lactic acid for completeness.  -con't D5w to correct hyperchloremia -con't daily checking metabolic panel   AKI on CKD 3A Hypokalemia resolved -daily monitoring of renal function and electrolytes -albumin to address AKI per surgery -strict I/Os -renally dose meds, avoid nephrotoxic meds  Macrocytic Anemia; suspect reticulocytosis may be contributing to high MCV. B12 WNL. Hgb 7.5-9 baseline -periodic CBCs; recheck tomorrow -transfuse for Hb<7 or hemodynamically significant bleeding  Hyperbilirubinemia- stable and persistent. CT on 7/11 had distended GB, but has not had worsening LFTs and no significant abdominal pain. -Continue to monitor LFTs  Severe Protein Calorie Malnutrition  -con't TPN per pharmacy   -Follow electrolytes and replete as needed  Acute Metabolic Encephalopathy  Hx Anxiety, Depression In setting of hypernatremia, azotemia, critical illness.  -Delirium precautions - Continue supportive care measures  Hypothyroidism  -con't synthroid IV while strict NPO  DM with Hyperglycemia, controlled -SSI PRN, resistant scale  -con't Q4h insulin aspart, can reduce or DC when D5w is able to be stopped -goal BG 140-180  Possible seizures; had twitching post-arrest from 7/6 -con't keppra 250mg  BID -outpatient follow up with neurology; in 3 months if no seizures they would plan to discontinue keppra  Best Practice:  DVT: lovenox  Feeding: TPN GI: protonix, NGT Disposition: per primary    Julian Hy, DO 11/16/20 8:06 AM [facility] [facility]

## 2020-11-16 NOTE — Procedures (Signed)
Central Venous Catheter Insertion Procedure Note  MELISA DONOFRIO  [id number]  1945-05-29  Date:11/16/20  Time:12:33 PM   Provider Performing:Jamilyn Pigeon D Rollene Rotunda   Procedure: Insertion of Non-tunneled Central Venous [phone]) with US guidance (30746)   Indication(s) Medication administration  Consent Risks of the procedure as well as the alternatives and risks of each were explained to the patient and/or caregiver.  Consent for the procedure was obtained and is signed in the bedside chart  Anesthesia Topical only with 1% lidocaine   Timeout Verified patient identification, verified procedure, site/side was marked, verified correct patient position, special equipment/implants available, medications/allergies/relevant history reviewed, required imaging and test results available.  Sterile Technique Maximal sterile technique including full sterile barrier drape, hand hygiene, sterile gown, sterile gloves, mask, hair covering, sterile ultrasound probe cover (if used).  Procedure Description Area of catheter insertion was cleaned with chlorhexidine and draped in sterile fashion.  With real-time ultrasound guidance a central venous catheter was placed into the left internal jugular vein. Nonpulsatile blood flow and easy flushing noted in all ports.  The catheter was sutured in place and sterile dressing applied.     Complications/Tolerance None; patient tolerated the procedure well. Chest X-ray is ordered to verify placement for internal jugular or subclavian cannulation.   Chest x-ray is not ordered for femoral cannulation.  EBL Minimal  Specimen(s) None  JD Rexene Agent [place] [facility] 11/16/2020, 12:33 PM  Please see [email or web address] for pager details.  From 7A-7P if no response, please call [phone]. After hours, please call ELink [phone].

## 2020-11-16 NOTE — Progress Notes (Signed)
SLP Cancellation Note  Patient Details Name: [name] MRN: [id number] DOB: 05/07/1945   Cancelled treatment:       Reason Eval/Treat Not Completed: Medical issues which prohibited therapy. Pt still not cleared for any PO intake from a GI/surgical standpoint. Now today with code blue after gagging/aspirating during NGT placement. Currently intubated. SLP to sign off for now. Please reorder for swallow eval when ready.     Osie Bond., M.A. [place] [facility] Pager [phone] Office [phone]  11/16/2020, 2:08 PM

## 2020-11-16 NOTE — Progress Notes (Signed)
Pharmacy Antibiotic Note  [name] is a 75 y.o. female admitted on 10/07/2020 with sepsis now post arrest and likely aspiration.  Pharmacy was consulted for Vanc and Cefepime dosing.   Cr improved slightly, MRSA PCR negative.  Plan: -Stop vancomycin -Adjust cefepime to 2g IV q12h -Follow Cr closely   Height: 4\' 11"  (149.9 cm) Weight: 97.1 kg (214 lb 1.1 oz) IBW/kg (Calculated) : 43.2  Temp (24hrs), Avg:98.3 F (36.8 C), Min:97.5 F (36.4 C), Max:98.9 F (37.2 C)  Recent Labs  Lab 11/13/20 0025 11/14/20 0634 11/14/20 1943 11/15/20 0124 11/15/20 0545 11/16/20 0635 11/16/20 0854 11/16/20 0902 11/16/20 1133 11/16/20 1435  WBC 19.6* 28.7*  --  24.6*  --   --   --  10.7*  --  23.3*  CREATININE 1.02* 1.12*  --   --  1.71* 1.51*  --  1.47* 1.95*  --   LATICACIDVEN  --   --  1.3  --   --   --  1.1  --   --  >11.0*     Estimated Creatinine Clearance: 24.7 mL/min (A) (by C-G formula based on SCr of 1.95 mg/dL (H)).    Allergies  Allergen Reactions   Codeine Itching   Meperidine Hcl Itching   Morphine Itching   Nylon Other (See Comments)    Stitches cause infection   Penicillins Hives, Itching and Rash    Tolerated cefepime and ceftriaxone Has patient had a PCN reaction causing immediate rash, facial/tongue/throat swelling, SOB or lightheadedness with hypotension: Yes Has patient had a PCN reaction causing severe rash involving mucus membranes or skin necrosis: Yes Has patient had a PCN reaction that required hospitalization: No Has patient had a PCN reaction occurring within the last 10 years: No If all of the above answers are "NO", then may proceed with Cephalosporin use.     Antimicrobials this admission: Vanc 7/18 >>  Cefep 7/18 >>   Thank you for allowing pharmacy to be a part of this patient's care.   Arrie Senate, PharmD, BCPS, [facility] Clinical Pharmacist [phone] Please check AMION for all [place] numbers 11/16/2020  ADDENDUM Scr has  now increased to 1.95; CrCl down to 24.7 ml/min; reduce cefepime to 2 gm IV Q 24 hrs; monitor renal function closely and adjust as needed  Gillermina Hu, PharmD, BCPS, [facility] Clinical Pharmacist 11/16/20 2136 PM

## 2020-11-16 NOTE — Progress Notes (Signed)
RT NOTE: Patient proned with RT X 3 and RN X 4 with no apparent complications. ETT secured with cloth tape at 22 at the lips, mepilex placed on cheeks and above lip. RT will continue to monitor.

## 2020-11-16 NOTE — Progress Notes (Signed)
PT Cancellation Note  Patient Details Name: [name] MRN: [id number] DOB: 1946-04-14   Cancelled Treatment:    Reason Eval/Treat Not Completed: Medical issues which prohibited therapy.  Pt coded this am and is now newly intubated.  Will see 7/21 as able. 11/16/2020  Ginger Carne., PT Acute Rehabilitation Services [phone]  (pager) [phone]  (office)   [name] 11/16/2020, 12:58 PM

## 2020-11-17 ENCOUNTER — Inpatient Hospital Stay (HOSPITAL_COMMUNITY): Payer: Medicare PPO

## 2020-11-17 DIAGNOSIS — Z9911 Dependence on respirator [ventilator] status: Secondary | ICD-10-CM | POA: Diagnosis not present

## 2020-11-17 DIAGNOSIS — I469 Cardiac arrest, cause unspecified: Secondary | ICD-10-CM | POA: Diagnosis not present

## 2020-11-17 DIAGNOSIS — J80 Acute respiratory distress syndrome: Secondary | ICD-10-CM

## 2020-11-17 DIAGNOSIS — J9601 Acute respiratory failure with hypoxia: Secondary | ICD-10-CM | POA: Diagnosis not present

## 2020-11-17 DIAGNOSIS — A419 Sepsis, unspecified organism: Secondary | ICD-10-CM

## 2020-11-17 LAB — CBC
HCT: 26.3 % — ABNORMAL LOW (ref 36.0–46.0)
Hemoglobin: 8.3 g/dL — ABNORMAL LOW (ref 12.0–15.0)
MCH: 29.2 pg (ref 26.0–34.0)
MCHC: 31.6 g/dL (ref 30.0–36.0)
MCV: 92.6 fL (ref 80.0–100.0)
Platelets: 157 10*3/uL (ref 150–400)
RBC: 2.84 MIL/uL — ABNORMAL LOW (ref 3.87–5.11)
RDW: 18.9 % — ABNORMAL HIGH (ref 11.5–15.5)
WBC: 38.5 10*3/uL — ABNORMAL HIGH (ref 4.0–10.5)
nRBC: 2.3 % — ABNORMAL HIGH (ref 0.0–0.2)

## 2020-11-17 LAB — POCT I-STAT 7, (LYTES, BLD GAS, ICA,H+H)
Acid-Base Excess: 6 mmol/L — ABNORMAL HIGH (ref 0.0–2.0)
Acid-Base Excess: 5 mmol/L — ABNORMAL HIGH (ref 0.0–2.0)
Acid-Base Excess: 5 mmol/L — ABNORMAL HIGH (ref 0.0–2.0)
Bicarbonate: 31.1 mmol/L — ABNORMAL HIGH (ref 20.0–28.0)
Bicarbonate: 31 mmol/L — ABNORMAL HIGH (ref 20.0–28.0)
Bicarbonate: 32 mmol/L — ABNORMAL HIGH (ref 20.0–28.0)
Calcium, Ion: 1 mmol/L — ABNORMAL LOW (ref 1.15–1.40)
Calcium, Ion: 1.03 mmol/L — ABNORMAL LOW (ref 1.15–1.40)
Calcium, Ion: 1.11 mmol/L — ABNORMAL LOW (ref 1.15–1.40)
HCT: 28 % — ABNORMAL LOW (ref 36.0–46.0)
HCT: 24 % — ABNORMAL LOW (ref 36.0–46.0)
HCT: 23 % — ABNORMAL LOW (ref 36.0–46.0)
Hemoglobin: 9.5 g/dL — ABNORMAL LOW (ref 12.0–15.0)
Hemoglobin: 8.2 g/dL — ABNORMAL LOW (ref 12.0–15.0)
Hemoglobin: 7.8 g/dL — ABNORMAL LOW (ref 12.0–15.0)
O2 Saturation: 100 %
O2 Saturation: 94 %
O2 Saturation: 96 %
Patient temperature: 98.9
Patient temperature: 98.9
Potassium: 3.7 mmol/L (ref 3.5–5.1)
Potassium: 3.8 mmol/L (ref 3.5–5.1)
Potassium: 4.1 mmol/L (ref 3.5–5.1)
Sodium: 145 mmol/L (ref 135–145)
Sodium: 143 mmol/L (ref 135–145)
Sodium: 143 mmol/L (ref 135–145)
TCO2: 32 mmol/L (ref 22–32)
TCO2: 33 mmol/L — ABNORMAL HIGH (ref 22–32)
TCO2: 34 mmol/L — ABNORMAL HIGH (ref 22–32)
pCO2 arterial: 44.8 mmHg (ref 32.0–48.0)
pCO2 arterial: 51.7 mmHg — ABNORMAL HIGH (ref 32.0–48.0)
pCO2 arterial: 64.2 mmHg — ABNORMAL HIGH (ref 32.0–48.0)
pH, Arterial: 7.449 (ref 7.350–7.450)
pH, Arterial: 7.386 (ref 7.350–7.450)
pH, Arterial: 7.307 — ABNORMAL LOW (ref 7.350–7.450)
pO2, Arterial: 209 mmHg — ABNORMAL HIGH (ref 83.0–108.0)
pO2, Arterial: 75 mmHg — ABNORMAL LOW (ref 83.0–108.0)
pO2, Arterial: 94 mmHg (ref 83.0–108.0)

## 2020-11-17 LAB — LACTIC ACID, PLASMA
Lactic Acid, Venous: 6.7 mmol/L (ref 0.5–1.9)
Lactic Acid, Venous: 5.5 mmol/L (ref 0.5–1.9)

## 2020-11-17 LAB — COMPREHENSIVE METABOLIC PANEL
ALT: 32 U/L (ref 0–44)
AST: 109 U/L — ABNORMAL HIGH (ref 15–41)
Albumin: 2.9 g/dL — ABNORMAL LOW (ref 3.5–5.0)
Alkaline Phosphatase: 60 U/L (ref 38–126)
Anion gap: 17 — ABNORMAL HIGH (ref 5–15)
BUN: 76 mg/dL — ABNORMAL HIGH (ref 8–23)
CO2: 26 mmol/L (ref 22–32)
Calcium: 7.6 mg/dL — ABNORMAL LOW (ref 8.9–10.3)
Chloride: 103 mmol/L (ref 98–111)
Creatinine, Ser: 2.33 mg/dL — ABNORMAL HIGH (ref 0.44–1.00)
GFR, Estimated: 21 mL/min — ABNORMAL LOW (ref >60)
Glucose, Bld: 286 mg/dL — ABNORMAL HIGH (ref 70–99)
Potassium: 3.9 mmol/L (ref 3.5–5.1)
Sodium: 146 mmol/L — ABNORMAL HIGH (ref 135–145)
Total Bilirubin: 2.9 mg/dL — ABNORMAL HIGH (ref 0.3–1.2)
Total Protein: 5.3 g/dL — ABNORMAL LOW (ref 6.5–8.1)

## 2020-11-17 LAB — GLUCOSE, CAPILLARY
Glucose-Capillary: 275 mg/dL — ABNORMAL HIGH (ref 70–99)
Glucose-Capillary: 295 mg/dL — ABNORMAL HIGH (ref 70–99)
Glucose-Capillary: 342 mg/dL — ABNORMAL HIGH (ref 70–99)
Glucose-Capillary: 338 mg/dL — ABNORMAL HIGH (ref 70–99)
Glucose-Capillary: 316 mg/dL — ABNORMAL HIGH (ref 70–99)
Glucose-Capillary: 272 mg/dL — ABNORMAL HIGH (ref 70–99)
Glucose-Capillary: 230 mg/dL — ABNORMAL HIGH (ref 70–99)
Glucose-Capillary: 204 mg/dL — ABNORMAL HIGH (ref 70–99)
Glucose-Capillary: 194 mg/dL — ABNORMAL HIGH (ref 70–99)

## 2020-11-17 LAB — MAGNESIUM: Magnesium: 1.7 mg/dL (ref 1.7–2.4)

## 2020-11-17 LAB — PHOSPHORUS: Phosphorus: 3.8 mg/dL (ref 2.5–4.6)

## 2020-11-17 MED ORDER — DEXTROSE 70 % IV SOLN
INTRAVENOUS | Status: AC
  Filled 2020-11-17: qty 1080

## 2020-11-17 MED ORDER — MAGNESIUM SULFATE IN D5W 1-5 GM/100ML-% IV SOLN
1.0000 g | Freq: Once | INTRAVENOUS | Status: AC
  Administered 2020-11-17: 1 g via INTRAVENOUS
  Filled 2020-11-17: qty 100

## 2020-11-17 MED ORDER — SODIUM CHLORIDE 0.9 % IV SOLN
200.0000 mg | INTRAVENOUS | Status: DC
  Administered 2020-11-17: 200 mg via INTRAVENOUS
  Filled 2020-11-17: qty 200

## 2020-11-17 MED ORDER — INSULIN ASPART 100 UNIT/ML IJ SOLN: 6.0000 [IU] | INTRAMUSCULAR | Status: DC

## 2020-11-17 MED ORDER — DEXTROSE 50 % IV SOLN: 0.0000 mL | INTRAVENOUS | Status: DC | PRN

## 2020-11-17 MED ORDER — FUROSEMIDE 10 MG/ML IJ SOLN
80.0000 mg | Freq: Once | INTRAMUSCULAR | Status: AC
  Administered 2020-11-17: 80 mg via INTRAVENOUS
  Filled 2020-11-17: qty 8

## 2020-11-17 MED ORDER — SODIUM CHLORIDE 0.9 % IV SOLN
100.0000 mg | INTRAVENOUS | Status: DC
  Filled 2020-11-17: qty 100

## 2020-11-17 MED ORDER — INSULIN REGULAR(HUMAN) IN NACL 100-0.9 UT/100ML-% IV SOLN
INTRAVENOUS | Status: DC
  Administered 2020-11-17: 14 [IU]/h via INTRAVENOUS
  Administered 2020-11-17: 9 [IU]/h via INTRAVENOUS
  Administered 2020-11-18 (×2): 7.5 [IU]/h via INTRAVENOUS
  Administered 2020-11-19: 3.4 [IU]/h via INTRAVENOUS
  Filled 2020-11-17 (×5): qty 100

## 2020-11-17 MED ORDER — MAGNESIUM SULFATE 2 GM/50ML IV SOLN
2.0000 g | Freq: Once | INTRAVENOUS | Status: AC
  Administered 2020-11-17: 2 g via INTRAVENOUS
  Filled 2020-11-17: qty 50

## 2020-11-17 MED ORDER — HEPARIN SODIUM (PORCINE) 5000 UNIT/ML IJ SOLN
5000.0000 [IU] | Freq: Three times a day (TID) | INTRAMUSCULAR | Status: DC
  Administered 2020-11-17 – 2020-11-18 (×3): 5000 [IU] via SUBCUTANEOUS
  Filled 2020-11-17 (×3): qty 1

## 2020-11-17 MED ORDER — CALCIUM GLUCONATE-NACL 1-0.675 GM/50ML-% IV SOLN
1.0000 g | Freq: Once | INTRAVENOUS | Status: AC
  Administered 2020-11-17: 1000 mg via INTRAVENOUS
  Filled 2020-11-17: qty 50

## 2020-11-17 MED ORDER — ALBUMIN HUMAN 25 % IV SOLN
12.5000 g | Freq: Once | INTRAVENOUS | Status: AC
  Administered 2020-11-17: 12.5 g via INTRAVENOUS
  Filled 2020-11-17: qty 50

## 2020-11-17 MED ORDER — METRONIDAZOLE 500 MG/100ML IV SOLN
500.0000 mg | Freq: Three times a day (TID) | INTRAVENOUS | Status: DC
  Administered 2020-11-17 – 2020-11-21 (×12): 500 mg via INTRAVENOUS
  Filled 2020-11-17 (×12): qty 100

## 2020-11-17 MED ORDER — SODIUM CHLORIDE 0.9 % IV SOLN
200.0000 mg | Freq: Once | INTRAVENOUS | Status: AC
  Filled 2020-11-17: qty 200

## 2020-11-17 NOTE — Progress Notes (Signed)
RT NOTE: Patient to be proned again per CCM. RT X 2 moved ETT to  left side and retaped with cloth tape. ETT secured at 22 @ the lips. Mepilex placed over both cheeks and under nose above mouth to help prevent skin breakdown. Vitals are stable. RT will continue to monitor.

## 2020-11-17 NOTE — Progress Notes (Signed)
Pt's head turned and arms rotated at this time.  ETT secured.

## 2020-11-17 NOTE — Progress Notes (Signed)
Pt's head turned and arms rotated at this time. Pt's BP dropped slightly but stabilized shortly after.  ETT secured

## 2020-11-17 NOTE — Progress Notes (Addendum)
PCCM INTERVAL PROGRESS NOTE   Placed in supine position. Hemodynamics and O2 sats remain approximately the same. ABG as below.   ABG    Component Value Date/Time   PHART 7.386 11/17/2020 1124   PCO2ART 51.7 (H) 11/17/2020 1124   PO2ART 75 (L) 11/17/2020 1124   HCO3 31.0 (H) 11/17/2020 1124   TCO2 33 (H) 11/17/2020 1124   ACIDBASEDEF 1.0 11/16/2020 2027   O2SAT 94.0 11/17/2020 1124     Vent Mode: PRVC FiO2 (%):  [90 %-100 %] 90 % Set Rate:  [34 bmp] 34 bmp Vt Set:  [260 mL-400 mL] 350 mL PEEP:  [8 cmH20-10 cmH20] 8 cmH20 Plateau Pressure:  [20 cmH20-25 cmH20] 20 cmH20   ARDS - drop tidal volume to 7cc/kg (6cc takes Vt down to 272mL) now that we have some wiggle room from an acid/base standpoint - Bicarb DC this morning - Increase PEEP - Repeat ABG 1 hour - Will decide on further prone positioning.     Georgann Housekeeper, AGACNP-BC [place] [facility]  See Amion for personal pager PCCM on call pager [phone] until 7pm. Please call Elink 7p-7a. [phone]  11/17/2020 11:47 AM

## 2020-11-17 NOTE — Progress Notes (Signed)
RT NOTE: Patient's head turned and arms rotated. Vitals are stable at this time. RT will continue to monitor.

## 2020-11-17 NOTE — Progress Notes (Signed)
RT NOTE: RT and RN X 4 placed patient in supine position.ETT secure at 22 at the lips. ABG to be obtained in one hour. Vitals are stable at this time. RT will continue to monitor.

## 2020-11-17 NOTE — Progress Notes (Addendum)
PHARMACY - TOTAL PARENTERAL NUTRITION CONSULT NOTE   PM UPDATE: Patient continues to be severely hyperglycemic. Last CBG at ~1130 was 342. Discussed with Dr. Carlis Abbott and will stop q4h scheduled insulin and start insulin gtt. Current TPN contains 60 units and TPN at 1800 contains 70 units. Discussed with Diabetes Coordinator and unable to program amount of insulin in TPN into EndoTool but is still appropriate to use insulin gtt and EndoTool. No need to "reset" EndoTool when new TPN hangs at 1800 with slight increase in insulin amount in TPN. Would avoid using long acting insulin since insulin is in TPN. Likely will need to continue insulin gtt while receiving stress dose steroids - Dr. Carlis Abbott in agreement.   Cristela Felt, PharmD, BCPS Clinical Pharmacist 11/17/2020 3:34 PM

## 2020-11-17 NOTE — Progress Notes (Signed)
Nutrition Follow-up  DOCUMENTATION CODES:   Not applicable  INTERVENTION:   - Continue TPN to meet 100% of pt's estimated needs  - If patient returns for surgery consider options for obtaining jejunal enteral access (i.e. small-bore feeding tube that extends past efferent limb obstruction vs surgical J-tube placement)  NUTRITION DIAGNOSIS:   Increased nutrient needs related to post-op healing as evidenced by estimated needs.  Ongoing, being addressed via TPN  GOAL:   Patient will meet greater than or equal to 90% of their needs  Met via TPN  MONITOR:   Diet advancement, Labs, Weight trends, Skin, I & O's, Other (TPN, ability to restart TF)  REASON FOR ASSESSMENT:   Ventilator    ASSESSMENT:   Patient with PMH significant for OSA, HTN, CAD, seizures, TIA, DM, CKD, HLD, and recently found HGD polyp of gastric antrum with focal intramucosal adenocarcinoma. Presents this admission for elective robotic distal gastrectomy.  6/28 - s/p robotic distal gastrectomy 7/05 - PEA arrest, intubated, post-pyloric Cortrak placed, trickles started 7/07 - Vital 1.5 at goal rate 7/10 - Cortrak pulled per patient 7/11 - extubated, Cortrak replaced (gastric), CTA negative for gastric distention/leak 7/12 - Cortrak advanced per diagnostic radiology into small bowel, TF advanced to goal 7/13 - vomiting episode 7/14 - vomiting episode, PICC placement, TPN started 7/15 - s/p EGD showing jejunal stenosis and efferent limb obstruction 7/18 - Cortrak removed 7/20 - cardiac arrest following massive aspiration, intubated, proned   Now supine. Requiring pressors given refractory shock/acidosis. Started on stress steroids. CBGs elevated- D5W discontinued and insulin adjusted. OG to LIS. No output documented today (had 800 ml immediately after OG placement yesterday per RN).  No BM x 5 days- has scheduled regimen in place.   TPN continues at goal rate of 75 ml/hr which provides 1829 kcal and 108  grams of protein, meeting 100% of pt's estimated needs.  Admission weight: 102.1 kg Current weight: 97.1 kg   UOP: 1395 ml x 24 hrs   Drips: epinephrine, levophed, vasopressin  Medications: dulcolax, solucortef, SS novolog, 10 mg reglan TID Labs: Cr 2.33- up from yesterday CBG 151-344  Diet Order:   Diet Order             Diet NPO time specified  Diet effective now                   EDUCATION NEEDS:   No education needs have been identified at this time  Skin:  Skin Assessment:  Skin Integrity Issues: Stage II: sacrum Incisions: abdomen, jaw  Last BM:  7/17  Height:   Ht Readings from Last 1 Encounters:  11/14/20 _0  (1.499 m)    Weight:   Wt Readings from Last 1 Encounters:  11/16/20 97.1 kg    BMI:  Body mass index is 43.24 kg/m.  Estimated Nutritional Needs:   Kcal:  1800-2000 kcal  Protein:  100-120 grams  Fluid:  >/= 1.8 L/day  Mariana Single MS, RD, LDN, CNSC Clinical Nutrition Pager listed in [place]

## 2020-11-17 NOTE — Progress Notes (Signed)
RT X 1 and RN x2 turned patients head to the left.  Tube secure.  Patient stable.

## 2020-11-17 NOTE — Progress Notes (Signed)
Patient ID: [name], female   DOB: October 08, 1945, 75 y.o.   MRN: [id number] [facility] Surgery Progress Note:     Subjective: Pt pulled out NGt yesterday and had another massive aspiration/code event yesterday.  Required prone positionin and 3 pressors.  Today has large bump in creatinine.    In speaking with son, pt has had issues with a stenosis in  her esophagus in the past.  I wonder if poor esophageal motility has contributed to her aspiration.     Objective: Vital signs in last 24 hours: Temp:  [98 F (36.7 C)-99 F (37.2 C)] 98.9 F (37.2 C) (07/21 1145) Pulse Rate:  [101-139] 101 (07/21 1117) Resp:  [9-37] 34 (07/21 1117) BP: (87-140)/(41-53) 140/46 (07/21 1117) SpO2:  [53 %-100 %] 97 % (07/21 1157) Arterial Line BP: (104-128)/(48-60) 106/48 (07/21 0700) FiO2 (%):  [90 %-100 %] 90 % (07/21 1129)  Intake/Output from previous day: 07/20 0701 - 07/21 0700 In: 8196.7 [I.V.:6853.5; Blood:345; IV Piggyback:998.1] Out: 3474 [Urine:1395] Intake/Output this shift: Total I/O In: 10 [I.V.:10] Out: 55 [Urine:55]   Physical Exam:  Sedated, intubated.   Resp:  comfortable on vent, synchronous CV regular rate and rhythm Abd soft, non tender, non distended.  Incisions healing well without evidence of infection.   Family at bedside.    Lab Results:  Results for orders placed or performed during the hospital encounter of 10/07/2020 (from the past 48 hour(s))  Glucose, capillary     Status: Abnormal   Collection Time: 11/15/20  4:27 PM  Result Value Ref Range   Glucose-Capillary 184 (H) 70 - 99 mg/dL    Comment: Glucose reference range applies only to samples taken after fasting for at least 8 hours.  MRSA Next Gen by PCR, Nasal     Status: None   Collection Time: 11/15/20  5:24 PM   Specimen: Nasal Mucosa; Nasal Swab  Result Value Ref Range   MRSA by PCR Next Gen NOT DETECTED NOT DETECTED    Comment: (NOTE) The GeneXpert MRSA Assay (FDA approved for NASAL  specimens only), is one component of a comprehensive MRSA colonization surveillance program. It is not intended to diagnose MRSA infection nor to guide or monitor treatment for MRSA infections. Test performance is not FDA approved in patients less than 80 years old. Performed at [facility], [place] [address], [place], [place] [address]   Glucose, capillary     Status: Abnormal   Collection Time: 11/15/20  7:35 PM  Result Value Ref Range   Glucose-Capillary 155 (H) 70 - 99 mg/dL    Comment: Glucose reference range applies only to samples taken after fasting for at least 8 hours.  Glucose, capillary     Status: Abnormal   Collection Time: 11/15/20 11:27 PM  Result Value Ref Range   Glucose-Capillary 161 (H) 70 - 99 mg/dL    Comment: Glucose reference range applies only to samples taken after fasting for at least 8 hours.  Glucose, capillary     Status: Abnormal   Collection Time: 11/16/20  3:43 AM  Result Value Ref Range   Glucose-Capillary 160 (H) 70 - 99 mg/dL    Comment: Glucose reference range applies only to samples taken after fasting for at least 8 hours.  Procalcitonin     Status: None   Collection Time: 11/16/20  6:35 AM  Result Value Ref Range   Procalcitonin 2.95 ng/mL    Comment:        Interpretation: PCT >  2 ng/mL: Systemic infection (sepsis) is likely, unless other causes are known. (NOTE)       Sepsis PCT Algorithm           Lower Respiratory Tract                                      Infection PCT Algorithm    ----------------------------     ----------------------------         PCT < 0.25 ng/mL                PCT < 0.10 ng/mL          Strongly encourage             Strongly discourage   discontinuation of antibiotics    initiation of antibiotics    ----------------------------     -----------------------------       PCT 0.25 - 0.50 ng/mL            PCT 0.10 - 0.25 ng/mL               OR       >80% decrease in PCT            Discourage initiation of                                             antibiotics      Encourage discontinuation           of antibiotics    ----------------------------     -----------------------------         PCT >= 0.50 ng/mL              PCT 0.26 - 0.50 ng/mL               AND       <80% decrease in PCT              Encourage initiation of                                             antibiotics       Encourage continuation           of antibiotics    ----------------------------     -----------------------------        PCT >= 0.50 ng/mL                  PCT > 0.50 ng/mL               AND         increase in PCT                  Strongly encourage                                      initiation of antibiotics    Strongly encourage escalation           of antibiotics                                     -----------------------------  PCT <= 0.25 ng/mL                                                 OR                                        > 80% decrease in PCT                                      Discontinue / Do not initiate                                             antibiotics  Performed at [facility], [place] [address] [place], [place] [address]   Basic metabolic panel     Status: Abnormal   Collection Time: 11/16/20  6:35 AM  Result Value Ref Range   Sodium 141 135 - 145 mmol/L   Potassium 3.6 3.5 - 5.1 mmol/L   Chloride 115 (H) 98 - 111 mmol/L   CO2 18 (L) 22 - 32 mmol/L   Glucose, Bld 174 (H) 70 - 99 mg/dL    Comment: Glucose reference range applies only to samples taken after fasting for at least 8 hours.   BUN 62 (H) 8 - 23 mg/dL   Creatinine, Ser 1.51 (H) 0.44 - 1.00 mg/dL   Calcium 9.0 8.9 - 10.3 mg/dL   GFR, Estimated 35 (L) >60 mL/min    Comment: (NOTE) Calculated using the CKD-EPI Creatinine Equation (2021)    Anion gap 8 5 - 15    Comment: Performed at [facility] [address], [place], [place] [address]  Magnesium     Status:  None   Collection Time: 11/16/20  6:35 AM  Result Value Ref Range   Magnesium 2.1 1.7 - 2.4 mg/dL    Comment: Performed at [facility] [address], [place],  [address]  Phosphorus     Status: None   Collection Time: 11/16/20  6:35 AM  Result Value Ref Range   Phosphorus 3.1 2.5 - 4.6 mg/dL    Comment: Performed at [facility] [address] [place], [place] [address]  Glucose, capillary     Status: Abnormal   Collection Time: 11/16/20  7:49 AM  Result Value Ref Range   Glucose-Capillary 165 (H) 70 - 99 mg/dL    Comment: Glucose reference range applies only to samples taken after fasting for at least 8 hours.  Lactic acid, plasma     Status: None   Collection Time: 11/16/20  8:54 AM  Result Value Ref Range   Lactic Acid, Venous 1.1 0.5 - 1.9 mmol/L    Comment: Performed at [facility] [address] [place] [address]  CBC     Status: Abnormal   Collection Time: 11/16/20  9:02 AM  Result Value Ref Range   WBC 10.7 (H) 4.0 - 10.5 K/uL   RBC 3.57 (L) 3.87 - 5.11 MIL/uL   Hemoglobin 10.3 (L) 12.0 - 15.0 g/dL  Comment: REPEATED TO VERIFY NOTIFIED [facility] KINCAID RN [phone] OK TO REPORT    HCT 35.4 (L) 36.0 - 46.0 %   MCV 99.2 80.0 - 100.0 fL    Comment: REPEATED TO VERIFY ZACH KINCAID RN [phone] 07 20 2022 BY MACEDAJ OK TO REPORT    MCH 28.9 26.0 - 34.0 pg   MCHC 29.1 (L) 30.0 - 36.0 g/dL   RDW 16.9 (H) 11.5 - 15.5 %   Platelets 113 (L) 150 - 400 K/uL    Comment: REPEATED TO VERIFY PLATELET COUNT CONFIRMED BY SMEAR    nRBC 0.3 (H) 0.0 - 0.2 %    Comment: Performed at [facility], [place] [address] [place], [place] [address]  Basic metabolic panel     Status: Abnormal   Collection Time: 11/16/20  9:02 AM  Result Value Ref Range   Sodium 143 135 - 145 mmol/L   Potassium 3.7 3.5 - 5.1 mmol/L   Chloride 119 (H) 98 - 111 mmol/L   CO2 19 (L) 22 - 32 mmol/L   Glucose, Bld 194 (H) 70 - 99 mg/dL    Comment: Glucose reference  range applies only to samples taken after fasting for at least 8 hours.   BUN 60 (H) 8 - 23 mg/dL   Creatinine, Ser 1.47 (H) 0.44 - 1.00 mg/dL   Calcium 9.0 8.9 - 10.3 mg/dL   GFR, Estimated 37 (L) >60 mL/min    Comment: (NOTE) Calculated using the CKD-EPI Creatinine Equation (2021)    Anion gap 5 5 - 15    Comment: Performed at [facility] [address], [place], [place] [address]  Basic metabolic panel     Status: Abnormal   Collection Time: 11/16/20 11:33 AM  Result Value Ref Range   Sodium 153 (H) 135 - 145 mmol/L   Potassium 3.8 3.5 - 5.1 mmol/L   Chloride 110 98 - 111 mmol/L   CO2 21 (L) 22 - 32 mmol/L   Glucose, Bld 344 (H) 70 - 99 mg/dL    Comment: Glucose reference range applies only to samples taken after fasting for at least 8 hours.   BUN 61 (H) 8 - 23 mg/dL   Creatinine, Ser 1.95 (H) 0.44 - 1.00 mg/dL   Calcium 8.0 (L) 8.9 - 10.3 mg/dL   GFR, Estimated 26 (L) >60 mL/min    Comment: (NOTE) Calculated using the CKD-EPI Creatinine Equation (2021)    Anion gap 22 (H) 5 - 15    Comment: REPEATED TO VERIFY Performed at [facility] [address], [place], [place] [address]   Magnesium     Status: None   Collection Time: 11/16/20 11:33 AM  Result Value Ref Range   Magnesium 2.3 1.7 - 2.4 mg/dL    Comment: Performed at [facility] [address] [place], [place] [address]  Phosphorus     Status: Abnormal   Collection Time: 11/16/20 11:33 AM  Result Value Ref Range   Phosphorus 7.0 (H) 2.5 - 4.6 mg/dL    Comment: Performed at [facility] [address], [place], [place] [address]  I-STAT 7, (LYTES, BLD GAS, ICA, H+H)     Status: Abnormal   Collection Time: 11/16/20 11:43 AM  Result Value Ref Range   pH, Arterial 6.994 (LL) 7.350 - 7.450   pCO2 arterial 57.6 (H) 32.0 - 48.0 mmHg   pO2, Arterial 57 (L) 83.0 - 108.0 mmHg   Bicarbonate  14.0 (L) 20.0 - 28.0 mmol/L   TCO2 16 (L) 22 - 32 mmol/L   O2 Saturation 72.0 %   Acid-base deficit  16.0 (H) 0.0 - 2.0 mmol/L   Sodium 147 (H) 135 - 145 mmol/L   Potassium 4.6 3.5 - 5.1 mmol/L   Calcium, Ion 1.33 1.15 - 1.40 mmol/L   HCT 22.0 (L) 36.0 - 46.0 %   Hemoglobin 7.5 (L) 12.0 - 15.0 g/dL   Patient temperature 98.4 F    Collection site Magazine features editor by Operator    Sample type ARTERIAL    Comment NOTIFIED PHYSICIAN   I-STAT 7, (LYTES, BLD GAS, ICA, H+H)     Status: Abnormal   Collection Time: 11/16/20 12:49 PM  Result Value Ref Range   pH, Arterial 7.057 (LL) 7.350 - 7.450   pCO2 arterial 70.8 (HH) 32.0 - 48.0 mmHg   pO2, Arterial 41 (L) 83.0 - 108.0 mmHg   Bicarbonate 20.0 20.0 - 28.0 mmol/L   TCO2 22 22 - 32 mmol/L   O2 Saturation 55.0 %   Acid-base deficit 10.0 (H) 0.0 - 2.0 mmol/L   Sodium 152 (H) 135 - 145 mmol/L   Potassium 4.9 3.5 - 5.1 mmol/L   Calcium, Ion 1.22 1.15 - 1.40 mmol/L   HCT 20.0 (L) 36.0 - 46.0 %   Hemoglobin 6.8 (LL) 12.0 - 15.0 g/dL   Patient temperature 98.4 F    Collection site Magazine features editor by Operator    Sample type ARTERIAL    Comment NOTIFIED PHYSICIAN   Lactic acid, plasma     Status: Abnormal   Collection Time: 11/16/20  2:35 PM  Result Value Ref Range   Lactic Acid, Venous >11.0 (HH) 0.5 - 1.9 mmol/L    Comment: CRITICAL RESULT CALLED TO, READ BACK BY AND VERIFIED WITHCleopatra Cedar RN [phone] K FORSYTH Performed at [facility], [address], [place] [address]   CBC     Status: Abnormal   Collection Time: 11/16/20  2:35 PM  Result Value Ref Range   WBC 23.3 (H) 4.0 - 10.5 K/uL   RBC 2.26 (L) 3.87 - 5.11 MIL/uL   Hemoglobin 6.6 (LL) 12.0 - 15.0 g/dL    Comment: REPEATED TO VERIFY THIS CRITICAL RESULT HAS VERIFIED AND BEEN CALLED TO ZACK KINCAID RN BY CAROL PHILLIPS ON 07 20 2022 AT 1606, AND HAS BEEN READ BACK.     HCT 23.5 (L) 36.0 - 46.0 %   MCV 104.0 (H) 80.0 - 100.0 fL   MCH 29.2 26.0 - 34.0 pg   MCHC 28.1 (L) 30.0 - 36.0 g/dL   RDW 17.3 (H) 11.5 - 15.5 %   Platelets 234 150 - 400 K/uL     Comment: REPEATED TO VERIFY DELTA CHECK NOTED    nRBC 4.4 (H) 0.0 - 0.2 %    Comment: Performed at [facility] [address], [place], [place] [address]  Glucose, capillary     Status: Abnormal   Collection Time: 11/16/20  4:05 PM  Result Value Ref Range   Glucose-Capillary 266 (H) 70 - 99 mg/dL    Comment: Glucose reference range applies only to samples taken after fasting for at least 8 hours.  Prepare RBC (crossmatch)     Status: None   Collection Time: 11/16/20  4:10 PM  Result Value Ref Range   Order Confirmation      ORDER PROCESSED BY BLOOD BANK  Performed at [facility], [place] [address], [place], [place] [address]   Type and screen [place]     Status: None   Collection Time: 11/16/20  4:31 PM  Result Value Ref Range   ABO/RH(D) A NEG    Antibody Screen NEG    Sample Expiration 11/19/2020,2359    Unit Number [id number]    Blood Component Type RED CELLS,LR    Unit division 00    Status of Unit ISSUED,FINAL    Transfusion Status OK TO TRANSFUSE    Crossmatch Result      Compatible Performed at [facility], [place] [address] [place], [place] [address]   Hemoglobin and hematocrit, blood     Status: Abnormal   Collection Time: 11/16/20  8:13 PM  Result Value Ref Range   Hemoglobin 8.6 (L) 12.0 - 15.0 g/dL    Comment: REPEATED TO VERIFY POST TRANSFUSION SPECIMEN    HCT 29.6 (L) 36.0 - 46.0 %    Comment: Performed at [facility] [address], [place], [place] [address]  Comprehensive metabolic panel     Status: Abnormal   Collection Time: 11/16/20  8:13 PM  Result Value Ref Range   Sodium 146 (H) 135 - 145 mmol/L   Potassium 3.3 (L) 3.5 - 5.1 mmol/L   Chloride 106 98 - 111 mmol/L   CO2 23 22 - 32 mmol/L   Glucose, Bld 239 (H) 70 - 99 mg/dL    Comment: Glucose reference range applies only to samples taken after fasting for at least 8 hours.   BUN 64 (H) 8 - 23 mg/dL   Creatinine, Ser 2.08 (H) 0.44 - 1.00 mg/dL     Comment: DELTA CHECK NOTED   Calcium 7.8 (L) 8.9 - 10.3 mg/dL   Total Protein 5.4 (L) 6.5 - 8.1 g/dL   Albumin 2.5 (L) 3.5 - 5.0 g/dL   AST 105 (H) 15 - 41 U/L   ALT 30 0 - 44 U/L   Alkaline Phosphatase 106 38 - 126 U/L   Total Bilirubin 2.5 (H) 0.3 - 1.2 mg/dL   GFR, Estimated 24 (L) >60 mL/min    Comment: (NOTE) Calculated using the CKD-EPI Creatinine Equation (2021)    Anion gap 17 (H) 5 - 15    Comment: Performed at [facility], [place] [address], [place], [place] [address]  Glucose, capillary     Status: Abnormal   Collection Time: 11/16/20  8:26 PM  Result Value Ref Range   Glucose-Capillary 226 (H) 70 - 99 mg/dL    Comment: Glucose reference range applies only to samples taken after fasting for at least 8 hours.  I-STAT 7, (LYTES, BLD GAS, ICA, H+H)     Status: Abnormal   Collection Time: 11/16/20  8:27 PM  Result Value Ref Range   pH, Arterial 7.321 (L) 7.350 - 7.450   pCO2 arterial 48.0 32.0 - 48.0 mmHg   pO2, Arterial 58 (L) 83.0 - 108.0 mmHg   Bicarbonate 24.8 20.0 - 28.0 mmol/L   TCO2 26 22 - 32 mmol/L   O2 Saturation 87.0 %   Acid-base deficit 1.0 0.0 - 2.0 mmol/L   Sodium 150 (H) 135 - 145 mmol/L   Potassium 3.2 (L) 3.5 - 5.1 mmol/L   Calcium, Ion 1.07 (L) 1.15 - 1.40 mmol/L   HCT 29.0 (L) 36.0 - 46.0 %   Hemoglobin 9.9 (L) 12.0 - 15.0 g/dL   Collection site art line  Drawn by RT    Sample type ARTERIAL   Glucose, capillary     Status: Abnormal   Collection Time: 11/16/20 11:39 PM  Result Value Ref Range   Glucose-Capillary 222 (H) 70 - 99 mg/dL    Comment: Glucose reference range applies only to samples taken after fasting for at least 8 hours.  Comprehensive metabolic panel     Status: Abnormal   Collection Time: 11/17/20  3:56 AM  Result Value Ref Range   Sodium 146 (H) 135 - 145 mmol/L   Potassium 3.9 3.5 - 5.1 mmol/L   Chloride 103 98 - 111 mmol/L   CO2 26 22 - 32 mmol/L   Glucose, Bld 286 (H) 70 - 99 mg/dL    Comment: Glucose reference range  applies only to samples taken after fasting for at least 8 hours.   BUN 76 (H) 8 - 23 mg/dL   Creatinine, Ser 2.33 (H) 0.44 - 1.00 mg/dL    Comment: DELTA CHECK NOTED   Calcium 7.6 (L) 8.9 - 10.3 mg/dL   Total Protein 5.3 (L) 6.5 - 8.1 g/dL   Albumin 2.9 (L) 3.5 - 5.0 g/dL   AST 109 (H) 15 - 41 U/L   ALT 32 0 - 44 U/L   Alkaline Phosphatase 60 38 - 126 U/L   Total Bilirubin 2.9 (H) 0.3 - 1.2 mg/dL   GFR, Estimated 21 (L) >60 mL/min    Comment: (NOTE) Calculated using the CKD-EPI Creatinine Equation (2021)    Anion gap 17 (H) 5 - 15    Comment: Performed at [facility], [place] [address] [place], [place] [address]  Magnesium     Status: None   Collection Time: 11/17/20  3:56 AM  Result Value Ref Range   Magnesium 1.7 1.7 - 2.4 mg/dL    Comment: Performed at [facility] [address], [place], [place] [address]  Phosphorus     Status: None   Collection Time: 11/17/20  3:56 AM  Result Value Ref Range   Phosphorus 3.8 2.5 - 4.6 mg/dL    Comment: Performed at [facility] [address] [place], [place] [address]  CBC     Status: Abnormal   Collection Time: 11/17/20  3:56 AM  Result Value Ref Range   WBC 38.5 (H) 4.0 - 10.5 K/uL   RBC 2.84 (L) 3.87 - 5.11 MIL/uL   Hemoglobin 8.3 (L) 12.0 - 15.0 g/dL   HCT 26.3 (L) 36.0 - 46.0 %   MCV 92.6 80.0 - 100.0 fL    Comment: REPEATED TO VERIFY POST TRANSFUSION SPECIMEN DELTA CHECK NOTED    MCH 29.2 26.0 - 34.0 pg   MCHC 31.6 30.0 - 36.0 g/dL   RDW 18.9 (H) 11.5 - 15.5 %   Platelets 157 150 - 400 K/uL   nRBC 2.3 (H) 0.0 - 0.2 %    Comment: Performed at [facility], [place] [address], [place], [place] [address]  Glucose, capillary     Status: Abnormal   Collection Time: 11/17/20  4:19 AM  Result Value Ref Range   Glucose-Capillary 275 (H) 70 - 99 mg/dL    Comment: Glucose reference range applies only to samples taken after fasting for at least 8 hours.  I-STAT 7, (LYTES, BLD GAS, ICA, H+H)     Status:  Abnormal   Collection Time: 11/17/20  5:34 AM  Result Value Ref Range   pH, Arterial 7.449 7.350 - 7.450  pCO2 arterial 44.8 32.0 - 48.0 mmHg   pO2, Arterial 209 (H) 83.0 - 108.0 mmHg   Bicarbonate 31.1 (H) 20.0 - 28.0 mmol/L   TCO2 32 22 - 32 mmol/L   O2 Saturation 100.0 %   Acid-Base Excess 6.0 (H) 0.0 - 2.0 mmol/L   Sodium 145 135 - 145 mmol/L   Potassium 3.7 3.5 - 5.1 mmol/L   Calcium, Ion 1.00 (L) 1.15 - 1.40 mmol/L   HCT 28.0 (L) 36.0 - 46.0 %   Hemoglobin 9.5 (L) 12.0 - 15.0 g/dL   Patient temperature 98.9 F    Collection site Radial    Drawn by Nurse    Sample type ARTERIAL   Lactic acid, plasma     Status: Abnormal   Collection Time: 11/17/20  5:39 AM  Result Value Ref Range   Lactic Acid, Venous 6.7 (HH) 0.5 - 1.9 mmol/L    Comment: CRITICAL VALUE NOTED.  VALUE IS CONSISTENT WITH PREVIOUSLY REPORTED AND CALLED VALUE. Performed at [facility], [place] [address] [place], [place] [address]   Glucose, capillary     Status: Abnormal   Collection Time: 11/17/20  8:23 AM  Result Value Ref Range   Glucose-Capillary 295 (H) 70 - 99 mg/dL    Comment: Glucose reference range applies only to samples taken after fasting for at least 8 hours.  I-STAT 7, (LYTES, BLD GAS, ICA, H+H)     Status: Abnormal   Collection Time: 11/17/20 11:24 AM  Result Value Ref Range   pH, Arterial 7.386 7.350 - 7.450   pCO2 arterial 51.7 (H) 32.0 - 48.0 mmHg   pO2, Arterial 75 (L) 83.0 - 108.0 mmHg   Bicarbonate 31.0 (H) 20.0 - 28.0 mmol/L   TCO2 33 (H) 22 - 32 mmol/L   O2 Saturation 94.0 %   Acid-Base Excess 5.0 (H) 0.0 - 2.0 mmol/L   Sodium 143 135 - 145 mmol/L   Potassium 3.8 3.5 - 5.1 mmol/L   Calcium, Ion 1.03 (L) 1.15 - 1.40 mmol/L   HCT 24.0 (L) 36.0 - 46.0 %   Hemoglobin 8.2 (L) 12.0 - 15.0 g/dL   Sample type ARTERIAL   Glucose, capillary     Status: Abnormal   Collection Time: 11/17/20 11:41 AM  Result Value Ref Range   Glucose-Capillary 342 (H) 70 - 99 mg/dL    Comment:  Glucose reference range applies only to samples taken after fasting for at least 8 hours.    Radiology/Results: Portable Chest xray  Result Date: 11/17/2020 CLINICAL DATA:  Check endotracheal tube, hypoxia EXAM: PORTABLE CHEST 1 VIEW COMPARISON:  11/16/2020 FINDINGS: Cardiac shadow is stable. Left-sided PICC line, left jugular central line and endotracheal tube are all noted in satisfactory position. Gastric catheter extends to the stomach. Patchy airspace disease is noted bilaterally but improved slightly in the interval from the prior exam. No bony abnormality is noted. IMPRESSION: Slight improved aeration when compare with the previous day. Electronically Signed   By: Inez Catalina M.D.   On: 11/17/2020 12:02   DG Chest Port 1 View  Result Date: 11/16/2020 CLINICAL DATA:  Endotracheal tube positioning while patient is prone. ARDS. EXAM: PORTABLE CHEST 1 VIEW COMPARISON:  Earlier today. FINDINGS: Magdalene Molly is difficult to define, the tip of the enteric tube is just beyond the level of the clavicular heads. Enteric tube in place with tip below the diaphragm. Left upper extremity PICC and left internal jugular central line remain in place. Worsening diffuse airspace disease throughout the  right hemithorax. Patchy airspace disease on the left is similar or worsening. Grossly stable heart size and mediastinal contours, partially obscured by perihilar opacities. IMPRESSION: 1. The tip of the enteric tube is just beyond the level of the clavicular heads. Other support apparatus is stable in position. 2. Worsening diffuse airspace disease throughout the right hemithorax. Patchy airspace disease on the left is similar or worsening. Electronically Signed   By: Keith Rake M.D.   On: 11/16/2020 20:48   DG Chest Port 1 View  Result Date: 11/16/2020 CLINICAL DATA:  Central line placement. EXAM: PORTABLE CHEST 1 VIEW COMPARISON:  Prior chest x-ray same day.  Chest x-ray 11/15/2020. FINDINGS: Interim placement of  left IJ line, tip over SVC. Left PICC line in stable position. Endotracheal tube tip 1 cm above the carina. Proximal repositioning of approximately 2 cm should be considered. Stable cardiomegaly. Severe bilateral multifocal pulmonary infiltrates/edema particularly throughout the right lung and left base. No pleural effusion or pneumothorax. Prior cervical spine fusion. IMPRESSION: 1. Interim placement of left IJ line, tip over SVC. Left PICC line stable position. 2. Endotracheal tube tip 1 cm above the. Proximal repositioning approximately 2 cm should be considered. 3.  Stable cardiomegaly. 4. Severe bilateral multifocal pulmonary infiltrates/edema 50 coli throughout the right lung and left base. Electronically Signed   By: Marcello Moores  Register   On: 11/16/2020 13:19   DG CHEST PORT 1 VIEW  Result Date: 11/16/2020 CLINICAL DATA:  Ventilator dependence. EXAM: PORTABLE CHEST 1 VIEW COMPARISON:  11/15/2020 FINDINGS: 1152 hours. Endotracheal tube tip is approximately 1.1 cm above the base of the carina. Left PICC line tip overlies the mid to distal SVC level. NG tube has been removed in the interval. Interval development of patchy airspace disease in the right upper and lower lung with persistent retrocardiac left base collapse/consolidation. Interstitial markings are diffusely coarsened with chronic features. The cardio pericardial silhouette is enlarged. Bones are diffusely demineralized. Telemetry leads overlie the chest. IMPRESSION: 1. Interval development of patchy airspace disease in the right upper and lower lung compatible with pneumonia or aspiration. 2. Persistent retrocardiac left base collapse/consolidation. 3. Diffuse interstitial opacity, likely chronic although interstitial edema not entirely excluded. 4. Interval removal of NG tube. Electronically Signed   By: Misty Stanley M.D.   On: 11/16/2020 12:16    Anti-infectives: Anti-infectives (From admission, onward)    Start     Dose/Rate Route Frequency  Ordered Stop   11/18/20 1200  anidulafungin (ERAXIS) 100 mg in sodium chloride 0.9 % 100 mL IVPB        100 mg 78 mL/hr over 100 Minutes Intravenous Every 24 hours 11/17/20 1104     11/17/20 2121  ceFEPIme (MAXIPIME) 2 g in sodium chloride 0.9 % 100 mL IVPB        2 g 200 mL/hr over 30 Minutes Intravenous Every 24 hours 11/16/20 2134     11/17/20 1615  metroNIDAZOLE (FLAGYL) IVPB 500 mg        500 mg 100 mL/hr over 60 Minutes Intravenous Every 8 hours 11/17/20 0812     11/17/20 1200  anidulafungin (ERAXIS) 200 mg in sodium chloride 0.9 % 200 mL IVPB        200 mg 78 mL/hr over 200 Minutes Intravenous Every 24 hours 11/17/20 1104     11/16/20 1000  ceFEPIme (MAXIPIME) 2 g in sodium chloride 0.9 % 100 mL IVPB  Status:  Discontinued        2 g 200 mL/hr over 30  Minutes Intravenous Every 12 hours 11/16/20 0746 11/16/20 2134   11/15/20 2000  vancomycin (VANCOCIN) IVPB 1000 mg/200 mL premix  Status:  Discontinued        1,000 mg 200 mL/hr over 60 Minutes Intravenous Every 24 hours 11/15/20 0827 11/16/20 0759   11/15/20 1800  ceFEPIme (MAXIPIME) 2 g in sodium chloride 0.9 % 100 mL IVPB  Status:  Discontinued        2 g 200 mL/hr over 30 Minutes Intravenous Every 24 hours 11/15/20 0827 11/16/20 0746   11/14/20 1945  ceFEPIme (MAXIPIME) 2 g in sodium chloride 0.9 % 100 mL IVPB  Status:  Discontinued        2 g 200 mL/hr over 30 Minutes Intravenous Every 12 hours 11/14/20 1853 11/15/20 0827   11/14/20 1945  vancomycin (VANCOCIN) IVPB 1000 mg/200 mL premix  Status:  Discontinued        1,000 mg 200 mL/hr over 60 Minutes Intravenous Every 24 hours 11/14/20 1855 11/15/20 0827   11/14/20 1615  cefTRIAXone (ROCEPHIN) 2 g in sodium chloride 0.9 % 100 mL IVPB  Status:  Discontinued        2 g 200 mL/hr over 30 Minutes Intravenous Every 24 hours 11/14/20 1521 11/14/20 1802   11/14/20 1615  metroNIDAZOLE (FLAGYL) IVPB 500 mg  Status:  Discontinued        500 mg 100 mL/hr over 60 Minutes Intravenous  Every 8 hours 11/14/20 1521 11/17/20 0812   11/04/20 1245  cefTRIAXone (ROCEPHIN) 2 g in sodium chloride 0.9 % 100 mL IVPB        2 g 200 mL/hr over 30 Minutes Intravenous Every 24 hours 11/04/20 1148 11/06/20 1302   11/02/20 1000  cefTRIAXone (ROCEPHIN) 2 g in sodium chloride 0.9 % 100 mL IVPB  Status:  Discontinued        2 g 200 mL/hr over 30 Minutes Intravenous Every 24 hours 11/01/20 1257 11/02/20 0856   11/02/20 1000  doxycycline (VIBRAMYCIN) 100 mg in sodium chloride 0.9 % 250 mL IVPB  Status:  Discontinued        100 mg 125 mL/hr over 120 Minutes Intravenous Every 12 hours 11/01/20 1257 11/02/20 0856   11/02/20 1000  ceFEPIme (MAXIPIME) 2 g in sodium chloride 0.9 % 100 mL IVPB  Status:  Discontinued        2 g 200 mL/hr over 30 Minutes Intravenous Every 12 hours 11/02/20 0856 11/04/20 1148   11/01/20 0915  levofloxacin (LEVAQUIN) IVPB 750 mg  Status:  Discontinued        750 mg 100 mL/hr over 90 Minutes Intravenous Every 24 hours 11/01/20 0827 11/01/20 1257   11/01/20 0730  levofloxacin (LEVAQUIN) IVPB 500 mg  Status:  Discontinued        500 mg 100 mL/hr over 60 Minutes Intravenous Every 24 hours 11/01/20 0631 11/01/20 1257   10/21/2020 2100  ciprofloxacin (CIPRO) IVPB 400 mg        400 mg 200 mL/hr over 60 Minutes Intravenous Every 12 hours 10/07/2020 1258 10/16/2020 2148   10/04/2020 0600  ciprofloxacin (CIPRO) IVPB 400 mg        400 mg 200 mL/hr over 60 Minutes Intravenous On call to O.R. 09/28/2020 0547 10/01/2020 0847       Assessment/Plan: Problem List: Patient Active Problem List   Diagnosis Date Noted   ARDS (adult respiratory distress syndrome) ([place])    Septic shock ([place])    Pressure injury of skin 11/04/2020   Cardiac arrest (  [place])    Emesis    Respiratory failure ([facility])    SOB (shortness of breath)    Acute respiratory failure with hypoxemia ([place])    Encounter for central line placement    S/P partial gastrectomy 09/30/2020   Type 2 diabetes mellitus without  complication ([place]) 35/24/8185   Hyperlipidemia 03/01/2014   Focal seizures ([place]) 10/21/2013   TIA (transient ischemic attack) 10/21/2013   CHEST PAIN 01/24/2010   BRADYCARDIA 12/23/2009   Diabetes ([place]) 08/04/2008   HYPOTHYROIDISM 03/06/2007   OBESITY 03/06/2007   OBSTRUCTIVE SLEEP APNEA 03/06/2007   HYPERTENSION, BENIGN 03/06/2007   CORONARY ARTERY DISEASE 03/06/2007   ALLERGIC RHINITIS 03/06/2007   DEGENERATIVE JOINT DISEASE, GENERALIZED 03/06/2007   BACK PAIN, CHRONIC 03/06/2007   Now s/p large aspiration and code x 2.  Remains on pressors with another bout of AKI.    Efferent limb obstruction.  If we can stabilize her, this will need to be revised.    TNA for severe protein calorie malnutrition and intolerance of enteric feeding.  Pressors weaning. Antifungals added to broad spectrum antibiotics Getting lasix.   Getting stress dose steroids as well.   Continue keppra.    Likely poor prognosis given second cardiac arrest. Was making slow improvements prior to this event.      LOS: 23 days   Milus Height, MD FACS Surgical Oncology, General Surgery, Trauma and [place] [facility], [place] for weekday/non holidays Check [email or web address] for coverage night/weekend/holidays  Do not use SecureChat as it is not reliable for timely patient care.     11/17/2020 12:15 PM

## 2020-11-17 NOTE — Progress Notes (Signed)
Pt's head turned and arms rotated at this time with no complications. ETT secured.

## 2020-11-17 NOTE — Progress Notes (Addendum)
PHARMACY - TOTAL PARENTERAL NUTRITION CONSULT NOTE  Indication: Partial gastrectomy and TF intolerance   Patient Measurements: Height: 4' 11"  (149.9 cm) Weight: 97.1 kg (214 lb 1.1 oz) IBW/kg (Calculated) : 43.2 TPN AdjBW (KG): 56.1 Body mass index is 43.24 kg/m. Usual Weight: 95 kg  Assessment:  75 yo W admitted for 6/28 elective distal gastrectomy for malignant polyp. Patient had a 6 minute code on 7/5 with severe respiratory failure and shock. Patient had an ileus and presumed functional gastric outlet obstruction post-op.  Patient tolerated TF 7/5-7/7 at 20 ml/hr, 7/7- 7/8 at 30 ml/hr, 7/9-7/10 at 40 ml/hr (goal 55 ml/hr). TF were held due to access issues then restarted 7/12 at 20-30 ml/hr. TF were stopped 7/13 for large volume bilious emesis that does not look like TF. Weight unchanged from usual but had anasarca earlier this admit, now resolved. Concern of efferent limb obstruction - likely plan for revision once stabilized. Patient with PEA arrest on 7/20 during NGT placement and is continued on epinephrine, norepinephrine and vasopressin.   Glucose / Insulin: hx DM on glipizide PTA - CBGs elevated post TPN initiation. CBGs had improved and were slightly above goal of <150 on TPN with insulin, D5, and scheduled q4h insulin. Now CBGs significant elevated with start of stress dose steroids. CBGs 160-286 - Received 43 units insulin in past 24 hrs + 60 units in TPN - Started on D5 @ 50 ml/hr for hypernatremia (resolved) and continued for hyperchloremia and scheduled q4h insulin (increased on 7/20 but none given) - Started on stress dose HC on 7/20 Electrolytes: Na 146 (on D5, Na removed in TPN on 7/16), Corr Ca 8.48 (Ca removed in TPN 7/20), ionized Ca 1.0, Cl 103, CO2 26. Mg 1.7 (s/p 2g x1 yesterday). Other electrolytes wnl.  Renal:  AKI. Scr 2.33 (BL 1.0 - 1.1). BUN 76. GI/Hepatic: AST/ALT 109/32. Alk Phos 60. Tbili 2.9 (sclera jaundice on 7/15, no jaundice present 7/19 per RN, now with  slight jaundice in sclera again on 7/21). Albumin 2.9. Prealbumin 7.3. TG wnl.  Intake / Output; MIVF:  UOP 0.3 ml/kg/hr - decrease. NG removed. LBM x2 on 7/16. 13 L pos.  - Furosemide x1 today Pertinent GI Imaging: 7/11 CT - wall thickening at [place] anastomosis, no evidence of outlet obstruction 7/12 Cortrak advanced to small bowel 7/19 CT abdomen pelvis - ordered GI Surgeries / Procedures:  6/28 elective distal gastrectomy 7/15 EGD - normal esophagus, jejunal stenosis, antrectomy found  Central access: triple IJ placed 11/01/20, PICC placed 11/10/20 TPN start date: 11/10/20  Nutritional Goals (per RD rec on 7/18): kCal: 1800-2000, Protein: 100-120, Fluid: >/= 1.8 L/d  Current Nutrition:  TPN Off TF 7/13  Plan:  Continue TPN at goal rate 75 ml/hr to provide 108g AA, 252g CHO and 54g ILE for a total of 1829 kCal, meeting 100% of patient needs Electrolytes in TPN: Resume Ca 3 mEq/L, reduce potassium to 40 mEq/L, increase Mg to 5 mEq/L. Continue Na 0 mEq/L, Phos 10 mmol/L. Continue max acetate.  Calcium gluconate 1g x1  Mg sulfate 1g x1 D5W discontinued per MD and insulin increased to 6 units q4h per MD Continue resistant SSI Q4H and increase regular insulin in TPN to 70 units Add standard MVI and reduce trace elements to half with jaundice in sclera present Standard TPN labs on Mon/Thurs, repeat electrolytes tomorrow   Cristela Felt, PharmD, BCPS Clinical Pharmacist 11/17/2020 7:29 AM

## 2020-11-17 NOTE — Progress Notes (Signed)
RT NOTE: RT X 3 and RN X 3 placed patient in prone position. Mepilex protective pads placed and patient's ETT secured with cloth tape. Vitals are stable. RT will continue to monitor.

## 2020-11-17 NOTE — Progress Notes (Addendum)
NAME:  [name], MRN:  [id number], DOB:  08-04-1945, LOS: 23 ADMISSION DATE:  10/13/2020, CONSULTATION DATE:  7/5 REFERRING MD:  Barry Dienes, CHIEF COMPLAINT:  Cardiac arrest   History of Present Illness:  [name] is a 75 y.o. 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 She had procedure done 6/28.  Pathology was negative for malignancy but showed LGD with negative nodes.   Early AM 7/5, she had wheezing so received a breathing treatment.  Shortly thereafter, she was found with emesis all over bed and floor.  She had gurgling sounds and altered LOC.  She ended up having loss of pulses with initial rhythm asystole.  She received 1mg  epinephrine before PEA for which she received a 2nd dose of epinephrine.  She then had ROSC after 6 minutes.  She was intubated during the code.   She was transferred to [facility] ICU where PCCM was asked to assist with vent management.  Pertinent  Medical History  OSA DM2 Hypertension CAD Allergic rhinitis  Significant Hospital Events: Including procedures, antibiotic start and stop dates in addition to other pertinent events   6/28 admit, robotic distal gastrectomy,  7/5 Six minute code (asystole then PEA), severe hypoxemic respiratory failure and shock 7/5 levaquin x1 7/5 ceftriaxone > 7/6 7/5 doxycycline > 7/6 7/6 cefepime >  7/6 EEG > generalized slowing suggestive of severe diffuse encephalopathy non-specific in etiology  7/6 TTE > LVEF 65-70%, grade II diastolic dysfunction, RSVP elevated 58.1 mmHg, trivial MR 7/12 extubated 7/13 To PCU, [facility] 7/18 PCCM called back for respiratory distress, moved to ICU for increased oxygen requirements. Temporarily was on bipap. 7/20 cardiac arrest following massive aspiration. PEA. Profoundly hypoxemic afterwards requiring prone ventilation.   Interim History /  Subjective:  Cardiac arrest following massive aspiration. Profoundly hypoxemic requiring prone ventilation. Refractory shock/acidosis started on norepi, epi, vaso, bicarb.   Objective   Blood pressure (!) 87/41, pulse (!) 108, temperature 98.8 F (37.1 C), temperature source Axillary, resp. rate (!) 34, height 4\' 11"  (1.499 m), weight 97.1 kg, SpO2 97 %.    Vent Mode: PRVC FiO2 (%):  [90 %-100 %] 90 % Set Rate:  [16 bmp-34 bmp] 34 bmp Vt Set:  [350 mL-400 mL] 400 mL PEEP:  [8 cmH20-12 cmH20] 8 cmH20 Plateau Pressure:  [24 cmH20-25 cmH20] 24 cmH20   Intake/Output Summary (Last 24 hours) at 11/17/2020 0742 Last data filed at 11/17/2020 0600 Gross per 24 hour  Intake 8196.66 ml  Output 745 ml  Net 7451.66 ml    Filed Weights   11/12/20 0458 11/14/20 0500 11/16/20 0300  Weight: 95.2 kg 98.8 kg 97.1 kg    Examination: General: obese elderly female in prone position on ventilator HEENT: No skin breakdown appreciated on face, no pressure to face from ETT.  Neuro: Sedated, cough with suctioning.  CV: RRR, no MRG PULM: Clear posterior breath sounds.  GI: Unable to assess.  Extremities: No acute deformity or significant edema.  Skin: Grossly intact.   CMP notable for bicarb 26, AG 17, BUN 76, Cr 2.33, mag 1.7 7/18 urine culture pending 7/18 blood culture 20k colonies yeast  Resolved Hospital Problem list   AKI  Hyperkalemia  NGMA secondary to Hyperchloremic Acidosis.   Assessment & Plan:    ARDS secondary to massive aspiration 7/20. She previously had an aspiration PNA and was treated with rocephin, required MV / extubated  7/12, and now has severe deconditioning.   - Full vent support - Decrease tidal volume to 8cc/kg, check ABG and reduce further if able.  - Extend duration of cefepime, flagyl - Fentanyl/versed for sedation. RASS goal -4 to -5.  - Planning to resume supine positioning around 0900 - 1000 - Not a candidate for vent wean at this time.   Septic shock due to  aspiration ?Cardiogenic shock secondary to cardiac arrest.  - ABX as above - Norepi, epi, vaso infusions - Stress steroids - Art line and central line still indicated.  - Pressor requirement significantly lower than yesterday.   Post-Op Gastrectomy  Concern for Ileus (7/18) -now more likely efferent limb obstruction.  -continue TPN  -Remain NPO NGT to LIWS -repeat CT ABD/Pelvis ordered but not yet performed> defer to surgery if this is still needed  AKI on CKD 3A worsening Hypokalemia resolved -daily monitoring of renal function and electrolytes -strict I/Os -renally dose meds, avoid nephrotoxic meds - I/O quite positive, will need diuresis once hemodynamics have improved.   Hyperbilirubinemia- stable and persistent. CT on 7/11 had distended GB, but has not had worsening LFTs and no significant abdominal pain. -Continue to monitor LFTs  Macrocytic Anemia; suspect reticulocytosis may be contributing to high MCV. B12 WNL. Hgb 7.5-9 baseline -transfuse for Hb<7 or hemodynamically significant bleeding  Severe Protein Calorie Malnutrition  -continue TPN per pharmacy   -Follow electrolytes and replete as needed  Acute Metabolic Encephalopathy  Hx Anxiety, Depression In setting of hypernatremia, azotemia, critical illness.  - Sedation as above. - SAT when appropriate  Hypothyroidism  -con't synthroid IV while strict NPO  DM with Hyperglycemia, controlled -SSI PRN, resistant scale  -con't Q4h insulin aspart, increase to 6 units -Discontinue D5w with resolution of hyperNa hyperCl.  -Taper stress steroids quickly.  -goal BG 140-180  Possible seizures; had twitching post-arrest from 7/6 -con't keppra 250mg  BID -outpatient follow up with neurology; in 3 months if no seizures they would plan to discontinue keppra  Hx OSA / Probable OHS Acute tachypnea thought related to [facility] with appropriate compensation by [employer].  Likely underlying OHS given pCO2 on ABG. -Agree with  prioritizing nutrition, need to also address mobility as respiratory muscle weakness and deconditioning are likely contributing to her poor respiratory status.   Best Practice:  DVT: lovenox  Feeding: TPN GI: protonix, NGT Disposition: per primary   Critical care time 55 minutes.   Georgann Housekeeper, AGACNP-BC [facility] [facility]  See Amion for personal pager PCCM on call pager [phone] until 7pm. Please call Elink 7p-7a. [phone]  11/17/2020 8:28 AM     Attending attestation:  Mrs. Chiu is a 75 y/o woman s/p partial gastrectomy for gastric adenocarcinoma postoperative course with two cardiac arrests, multiple aspiration episodes. Most recent aspiration and subsequent arrest was yesterday. She remains on 3 pressors and MV. Proned last night, supinated this morning. Was able to follow some simple commands and track with her eyes yesterday after cardiac arrest.  BP (!) 140/46 Comment: aline reading  Pulse (!) 101   Temp 98.9 F (37.2 C) (Axillary)   Resp (!) 34   Ht 4\' 11"  (1.499 m)   Wt 97.1 kg   SpO2 97%   BMI 43.24 kg/m  Critically ill appearing woman lying in bed in NAD [place]/AT, ETT Synchronous with MV, CTAB, minimal ETT secretions Tachycardic, reg rhythm Abdominal exam deferred due to positioning, bilious output from NGT +LE edema, no cyanosis No rashes. Bruise persists around  left flank and back.  ABG    Component Value Date/Time   PHART 7.386 11/17/2020 1124   PCO2ART 51.7 (H) 11/17/2020 1124   PO2ART 75 (L) 11/17/2020 1124   HCO3 31.0 (H) 11/17/2020 1124   TCO2 33 (H) 11/17/2020 1124   ACIDBASEDEF 1.0 11/16/2020 2027   O2SAT 94.0 11/17/2020 1124    LA >11> 6.7> 5.5 WBC 38.5 H/H 8.3/26.3 BUN 76 Cr 2.33 CXR> bilateral airspace disease, most prominent in lower lobes. ETT 3 cm above carina.  Assessment & plan:  Acute hypoxic and hypercapneic respiratory failure due to ARDS from aspiration pneumonitis vs pneumonia -LTVV, 4-8cc/kg  IBW with goal Pplat<30 and DP<15 -VAP prevention protocol -PAD protocol for sedation -prone 16h/day until P:F >150 -con't antibiotics for aspiration pneumonia -con't NGT to LIWS  Septic shock due to aspiration pneumonia vs pneumonitis -checking blood fungal culture; on TPN and has candida in urine -con't pressors as required to maintain MAP >65 -con't broad spectrum antibiotics -start empiric antifungals; hopefully can deescalate based on cultures -stress dose steroids  Gastric adenocarcinoma s/p resection with ileus vs jejunal limb obstruction -appreciate surgery's post-op care -con't TPN -con't NGT to LIWS  Severe protein energy malnutrition -con't TPN -strict NPO  Hypothyroidism -con't IV syhtnroid  Possible seizures after first arrest; have been recurrent after second arrest -con't keppra; no confirmed seizures on EEG  This patient is critically ill with multiple organ system failure which requires frequent high complexity decision making, assessment, support, evaluation, and titration of therapies. This was completed through the application of advanced monitoring technologies and extensive interpretation of multiple databases. During this encounter critical care time was devoted to patient care services described in this note for 43 minutes.  Julian Hy, DO 11/17/20 2:15 PM [place] [facility]

## 2020-11-17 NOTE — Progress Notes (Signed)
PT Cancellation Note  Patient Details Name: [name] MRN: [id number] DOB: 1946-02-07   Cancelled Treatment:    Reason Eval/Treat Not Completed: Patient not medically ready (Pt remains on vent with FiO2 90% and not currently appropriate)   Shakira Los B Zelta [place] 11/17/2020, 7:07 AM Bayard Males, PT Acute Rehabilitation Services Pager: [phone] Office: [phone]

## 2020-11-18 ENCOUNTER — Inpatient Hospital Stay (HOSPITAL_COMMUNITY): Payer: Medicare PPO

## 2020-11-18 DIAGNOSIS — Z7189 Other specified counseling: Secondary | ICD-10-CM

## 2020-11-18 DIAGNOSIS — J9601 Acute respiratory failure with hypoxia: Secondary | ICD-10-CM | POA: Diagnosis not present

## 2020-11-18 DIAGNOSIS — Z515 Encounter for palliative care: Secondary | ICD-10-CM

## 2020-11-18 DIAGNOSIS — J80 Acute respiratory distress syndrome: Secondary | ICD-10-CM | POA: Diagnosis not present

## 2020-11-18 DIAGNOSIS — I469 Cardiac arrest, cause unspecified: Secondary | ICD-10-CM | POA: Diagnosis not present

## 2020-11-18 DIAGNOSIS — Z66 Do not resuscitate: Secondary | ICD-10-CM

## 2020-11-18 LAB — POCT I-STAT 7, (LYTES, BLD GAS, ICA,H+H)
Acid-Base Excess: 6 mmol/L — ABNORMAL HIGH (ref 0.0–2.0)
Acid-Base Excess: 4 mmol/L — ABNORMAL HIGH (ref 0.0–2.0)
Bicarbonate: 33.3 mmol/L — ABNORMAL HIGH (ref 20.0–28.0)
Bicarbonate: 30.2 mmol/L — ABNORMAL HIGH (ref 20.0–28.0)
Calcium, Ion: 1.08 mmol/L — ABNORMAL LOW (ref 1.15–1.40)
Calcium, Ion: 1.15 mmol/L (ref 1.15–1.40)
HCT: 24 % — ABNORMAL LOW (ref 36.0–46.0)
HCT: 21 % — ABNORMAL LOW (ref 36.0–46.0)
Hemoglobin: 8.2 g/dL — ABNORMAL LOW (ref 12.0–15.0)
Hemoglobin: 7.1 g/dL — ABNORMAL LOW (ref 12.0–15.0)
O2 Saturation: 97 %
O2 Saturation: 94 %
Patient temperature: 98.1
Patient temperature: 37.3
Potassium: 5.8 mmol/L — ABNORMAL HIGH (ref 3.5–5.1)
Potassium: 4.2 mmol/L (ref 3.5–5.1)
Sodium: 139 mmol/L (ref 135–145)
Sodium: 139 mmol/L (ref 135–145)
TCO2: 35 mmol/L — ABNORMAL HIGH (ref 22–32)
TCO2: 32 mmol/L (ref 22–32)
pCO2 arterial: 62.7 mmHg — ABNORMAL HIGH (ref 32.0–48.0)
pCO2 arterial: 59.3 mmHg — ABNORMAL HIGH (ref 32.0–48.0)
pH, Arterial: 7.332 — ABNORMAL LOW (ref 7.350–7.450)
pH, Arterial: 7.317 — ABNORMAL LOW (ref 7.350–7.450)
pO2, Arterial: 95 mmHg (ref 83.0–108.0)
pO2, Arterial: 82 mmHg — ABNORMAL LOW (ref 83.0–108.0)

## 2020-11-18 LAB — MAGNESIUM: Magnesium: 2.3 mg/dL (ref 1.7–2.4)

## 2020-11-18 LAB — GLUCOSE, CAPILLARY
Glucose-Capillary: 181 mg/dL — ABNORMAL HIGH (ref 70–99)
Glucose-Capillary: 191 mg/dL — ABNORMAL HIGH (ref 70–99)
Glucose-Capillary: 171 mg/dL — ABNORMAL HIGH (ref 70–99)
Glucose-Capillary: 177 mg/dL — ABNORMAL HIGH (ref 70–99)
Glucose-Capillary: 191 mg/dL — ABNORMAL HIGH (ref 70–99)
Glucose-Capillary: 172 mg/dL — ABNORMAL HIGH (ref 70–99)
Glucose-Capillary: 192 mg/dL — ABNORMAL HIGH (ref 70–99)
Glucose-Capillary: 163 mg/dL — ABNORMAL HIGH (ref 70–99)
Glucose-Capillary: 162 mg/dL — ABNORMAL HIGH (ref 70–99)
Glucose-Capillary: 164 mg/dL — ABNORMAL HIGH (ref 70–99)
Glucose-Capillary: 161 mg/dL — ABNORMAL HIGH (ref 70–99)
Glucose-Capillary: 166 mg/dL — ABNORMAL HIGH (ref 70–99)
Glucose-Capillary: 163 mg/dL — ABNORMAL HIGH (ref 70–99)
Glucose-Capillary: 168 mg/dL — ABNORMAL HIGH (ref 70–99)
Glucose-Capillary: 158 mg/dL — ABNORMAL HIGH (ref 70–99)

## 2020-11-18 LAB — COMPREHENSIVE METABOLIC PANEL
ALT: 30 U/L (ref 0–44)
AST: 67 U/L — ABNORMAL HIGH (ref 15–41)
Albumin: 2.5 g/dL — ABNORMAL LOW (ref 3.5–5.0)
Alkaline Phosphatase: 67 U/L (ref 38–126)
Anion gap: 9 (ref 5–15)
BUN: 97 mg/dL — ABNORMAL HIGH (ref 8–23)
CO2: 29 mmol/L (ref 22–32)
Calcium: 7.9 mg/dL — ABNORMAL LOW (ref 8.9–10.3)
Chloride: 101 mmol/L (ref 98–111)
Creatinine, Ser: 2.85 mg/dL — ABNORMAL HIGH (ref 0.44–1.00)
GFR, Estimated: 17 mL/min — ABNORMAL LOW (ref >60)
Glucose, Bld: 188 mg/dL — ABNORMAL HIGH (ref 70–99)
Potassium: 3.9 mmol/L (ref 3.5–5.1)
Sodium: 139 mmol/L (ref 135–145)
Total Bilirubin: 3.2 mg/dL — ABNORMAL HIGH (ref 0.3–1.2)
Total Protein: 5.2 g/dL — ABNORMAL LOW (ref 6.5–8.1)

## 2020-11-18 LAB — BASIC METABOLIC PANEL
Anion gap: 10 (ref 5–15)
BUN: 117 mg/dL — ABNORMAL HIGH (ref 8–23)
CO2: 28 mmol/L (ref 22–32)
Calcium: 7.8 mg/dL — ABNORMAL LOW (ref 8.9–10.3)
Chloride: 100 mmol/L (ref 98–111)
Creatinine, Ser: 3.14 mg/dL — ABNORMAL HIGH (ref 0.44–1.00)
GFR, Estimated: 15 mL/min — ABNORMAL LOW (ref >60)
Glucose, Bld: 170 mg/dL — ABNORMAL HIGH (ref 70–99)
Potassium: 4.3 mmol/L (ref 3.5–5.1)
Sodium: 138 mmol/L (ref 135–145)

## 2020-11-18 LAB — CBC
HCT: 23.5 % — ABNORMAL LOW (ref 36.0–46.0)
HCT: 22.1 % — ABNORMAL LOW (ref 36.0–46.0)
Hemoglobin: 7 g/dL — ABNORMAL LOW (ref 12.0–15.0)
Hemoglobin: 6.7 g/dL — CL (ref 12.0–15.0)
MCH: 28.2 pg (ref 26.0–34.0)
MCH: 28.8 pg (ref 26.0–34.0)
MCHC: 29.8 g/dL — ABNORMAL LOW (ref 30.0–36.0)
MCHC: 30.3 g/dL (ref 30.0–36.0)
MCV: 94.8 fL (ref 80.0–100.0)
MCV: 94.8 fL (ref 80.0–100.0)
Platelets: 107 10*3/uL — ABNORMAL LOW (ref 150–400)
Platelets: 89 10*3/uL — ABNORMAL LOW (ref 150–400)
RBC: 2.48 MIL/uL — ABNORMAL LOW (ref 3.87–5.11)
RBC: 2.33 MIL/uL — ABNORMAL LOW (ref 3.87–5.11)
RDW: 19.2 % — ABNORMAL HIGH (ref 11.5–15.5)
RDW: 19 % — ABNORMAL HIGH (ref 11.5–15.5)
WBC: 33.3 10*3/uL — ABNORMAL HIGH (ref 4.0–10.5)
WBC: 32.4 10*3/uL — ABNORMAL HIGH (ref 4.0–10.5)
nRBC: 0.6 % — ABNORMAL HIGH (ref 0.0–0.2)
nRBC: 0.4 % — ABNORMAL HIGH (ref 0.0–0.2)

## 2020-11-18 LAB — LACTIC ACID, PLASMA: Lactic Acid, Venous: 2.2 mmol/L (ref 0.5–1.9)

## 2020-11-18 LAB — PHOSPHORUS: Phosphorus: 3.7 mg/dL (ref 2.5–4.6)

## 2020-11-18 LAB — PREPARE RBC (CROSSMATCH)

## 2020-11-18 MED ORDER — SODIUM CHLORIDE 0.9% IV SOLUTION: Freq: Once | INTRAVENOUS | Status: AC

## 2020-11-18 MED ORDER — TRAVASOL 10 % IV SOLN
INTRAVENOUS | Status: AC
  Filled 2020-11-18: qty 1080

## 2020-11-18 MED ORDER — SODIUM CHLORIDE 0.9 % IV SOLN
2.0000 g | INTRAVENOUS | Status: DC
  Administered 2020-11-18 – 2020-11-20 (×3): 2 g via INTRAVENOUS
  Filled 2020-11-18: qty 20
  Filled 2020-11-18: qty 2
  Filled 2020-11-18: qty 20
  Filled 2020-11-18: qty 2

## 2020-11-18 NOTE — Progress Notes (Addendum)
Met with husband, grand-daughter, son at bedside.  We discussed [name]'s difficult path to date, deteriorating renal function, poor nutritional status and profound debility.  We agreed to continue to push forward with hopes for recovery but if [name]'s heart stops or kidneys fail we will allow her to pass in peace with dialysis, chest compressions, or shocks.  Hope palliative can establish relationship as well since this will be a difficult next couple days for family.  15 minutes spent advance care planning in coordinating family discussions and speaking with primary team.  Erskine Emery MD PCCM

## 2020-11-18 NOTE — Progress Notes (Signed)
NAME:  [name], MRN:  [id number], DOB:  April 11, 1946, LOS: 24 ADMISSION DATE:  10/24/2020, CONSULTATION DATE:  7/5 REFERRING MD:  Barry Dienes, CHIEF COMPLAINT:  Cardiac arrest   History of Present Illness:  [name] is a 75 y.o. 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 She had procedure done 6/28.  Pathology was negative for malignancy but showed LGD with negative nodes.   Early AM 7/5, she had wheezing so received a breathing treatment.  Shortly thereafter, she was found with emesis all over bed and floor.  She had gurgling sounds and altered LOC.  She ended up having loss of pulses with initial rhythm asystole.  She received '1mg'$  epinephrine before PEA for which she received a 2nd dose of epinephrine.  She then had ROSC after 6 minutes.  She was intubated during the code.   She was transferred to [facility] ICU where PCCM was asked to assist with vent management.  Pertinent  Medical History  OSA DM2 Hypertension CAD Allergic rhinitis  Significant Hospital Events: Including procedures, antibiotic start and stop dates in addition to other pertinent events   6/28 admit, robotic distal gastrectomy,  7/5 Six minute code (asystole then PEA), severe hypoxemic respiratory failure and shock 7/5 levaquin x1 7/5 ceftriaxone > 7/6 7/5 doxycycline > 7/6 7/6 cefepime >  7/6 EEG > generalized slowing suggestive of severe diffuse encephalopathy non-specific in etiology  7/6 TTE > LVEF 65-70%, grade II diastolic dysfunction, RSVP elevated 58.1 mmHg, trivial MR 7/12 extubated 7/13 To PCU, [facility] 7/18 PCCM called back for respiratory distress, moved to ICU for increased oxygen requirements. Temporarily was on bipap. 7/20 cardiac arrest following massive aspiration. PEA. Profoundly hypoxemic afterwards requiring prone ventilation.   Interim History /  Subjective:  No events, prone this am. High P/F ratio persists.  Objective   Blood pressure (!) 141/47, pulse 88, temperature 98.8 F (37.1 C), temperature source Axillary, resp. rate (!) 34, height '4\' 11"'$  (1.499 m), weight 103.6 kg, SpO2 100 %.    Vent Mode: PRVC FiO2 (%):  [80 %-100 %] 80 % Set Rate:  [34 bmp] 34 bmp Vt Set:  [260 mL-350 mL] 300 mL PEEP:  [8 cmH20-10 cmH20] 10 cmH20 Plateau Pressure:  [20 cmH20-29 cmH20] 22 cmH20   Intake/Output Summary (Last 24 hours) at 11/18/2020 [id number] Last data filed at 11/18/2020 0700 Gross per 24 hour  Intake 3769.21 ml  Output 127 ml  Net 3642.21 ml    Filed Weights   11/14/20 0500 11/16/20 0300 11/18/20 0500  Weight: 98.8 kg 97.1 kg 103.6 kg    Examination: Proned Lungs sound suprisingly good Ext warm Diffuse mild anasarca Large L flank bruise  ABG prone 7.33/62/95 7/18 urine culture yeast  Resolved Hospital Problem list   AKI  Hyperkalemia  NGMA secondary to Hyperchloremic Acidosis.   Assessment & Plan:  Recurrent severe ARDS due to massive aspiration events- high P/F ratio persists Worsening renal failure- ATN from arrest, not a great HD candidate Acute anemia- worsening, has flank bruise, may need imaging if keeps dropping and stable enough on vent Likely OSA Anoxic brain injury from initial arrest- had been improving slowly Post partial gastrectomy- complicated by recurrent aspiration and gastroparesis Hypothyroidism DM2 with hyperglycemia Lactic acidemia post arrest- improved Muscular deconditioning- from prolonged ICU stay  - Continue lung protective TV, proning PRN P/F ratio < 150 with 16/8h ratio - Sedation  titrated to vent synchrony while she is so ill - Abx to rocephin, 5 days or so reasonable for chemical aspiration; do not think she is fungemic although at risk with TPN, no fevers, history more c/w aspiration - TPN, insulin as ordered, appreciate pharmD help - Recheck afternoon BMP/CBC; hold heparin -  Need to have a [place] conversation after this latest setback, I worry about her surviving this hospitalization and not sure HD is going to change that; will try to touch base with primary  Best Practice:  DVT: off  Feeding: TPN GI: protonix, NGT Disposition: per primary

## 2020-11-18 NOTE — Progress Notes (Signed)
Inpatient Diabetes Program Recommendations  AACE/ADA: New Consensus Statement on Inpatient Glycemic Control (2015)  Target Ranges:  Prepandial:   less than 140 mg/dL      Peak postprandial:   less than 180 mg/dL (1-2 hours)      Critically ill patients:  140 - 180 mg/dL   Lab Results  Component Value Date   GLUCAP 163 (H) 11/18/2020   HGBA1C 6.1 (H) 10/19/2020    Review of Glycemic Control Results for [name], [name] (MRN [id number]) as of 11/18/2020 12:59  Ref. Range 11/18/2020 05:23 11/18/2020 06:52 11/18/2020 08:01 11/18/2020 09:39 11/18/2020 11:52  Glucose-Capillary Latest Ref Range: 70 - 99 mg/dL 177 (H) 191 (H) 172 (H) 192 (H) 163 (H)   Diabetes history: DM 2 Outpatient Diabetes medications:  Glucotrol 5 mg bid Current orders for Inpatient glycemic control:  TPN with 70 units of insulin per liter IV insulin per [facility] Inpatient Diabetes Program Recommendations:   Agree with current orders.  Insulin needs are high.  Steroids stopped so insulin needs should decrease.    Thanks,  Adah Perl, RN, BC-ADM Inpatient Diabetes Coordinator Pager [phone]  (8a-5p)

## 2020-11-18 NOTE — Consult Note (Signed)
Consultation Note Date: 11/18/2020   Patient Name: [name]  DOB: Sep 21, 1945  MRN: [id number]  Age / Sex: 75 y.o., female  PCP: Harlan Stains, MD Referring Physician: Ina Homes, MD  Reason for Consultation: Establishing goals of care  HPI/Patient Profile: 75 y.o. female  with past medical history of hypothyroidism, T2DM, OSA, HTN, CAD, DJD, chronic back pain, focal seizures, TIA, and HLD admitted on 10/04/2020 with for elective robotic distal gastrectomy as surgical intervention for recently found HGD polyp of gastric antrum with focal intramucosal adenocarcinoma. She had procedure done 6/28.  Pathology was negative for malignancy but showed LGD with negative nodes. Patient experienced cardiac arrest early AM of 7/5 - had 6 minutes of CPR and was intubated.  Extubated 7/12. Was improving but then experienced respiratory distress 7/18 and had second cardiac arrest 7/20 following massive aspiration. Required prone ventilation following arrest and 3 vasopressors. Also now with worsening renal failure. PMT consulted to discuss [place].  Clinical Assessment and Goals of Care: I have reviewed medical records including EPIC notes, labs and imaging, received report from RN, assessed the patient and then met with patient's spouse Nathaneil Canary and son Patrick Jupiter  to discuss diagnosis prognosis, [place], EOL wishes, disposition and options.  I introduced Palliative Medicine as specialized medical care for people living with serious illness. It focuses on providing relief from the symptoms and stress of a serious illness. The goal is to improve quality of life for both the patient and the family.  Son and spouse review that patient was doing very well prior to this admission - independently able to care for herself with no significant limitations. We discuss how shocking and sudden current situation feels for them. Emotional support provided.   We discussed patient's  current illness and what it means in the larger context of patient's on-going co-morbidities.  Natural disease trajectory and expectations at EOL were discussed. We discuss patient's repeated cardiac arrests. We discuss her worsening kidney failure. We discuss that it is unlikely patient will return to her fully independent baseline.   I attempted to elicit values and goals of care important to the patient.  Family shares that patient has a living will and had also expressed previous desires for DNR status.   The difference between aggressive medical intervention and comfort care was considered in light of the patient's goals of care.   Discussed with family the importance of continued conversation with family and the medical providers regarding overall plan of care and treatment options, ensuring decisions are within the context of the patient's values and GOCs.    Family share they do not think patient would want her life prolonged if she were not expected to recover to her independent baseline. We discuss a plan to not initiate HD. They would like to continue current care to keep patient stable over weekend to allow time for family to visit. Likely plan for terminal extubation Monday.   Questions and concerns were addressed. The family was encouraged to call with questions or concerns.    Primary Decision Maker NEXT OF KIN - spouse Olivene Cookston  SUMMARY OF RECOMMENDATIONS   - code status DNR - NO HD/no escalation of care - maintain current state over weekend to allow family time to come and visit patient - anticipate terminal extubation on Monday  Code Status/Advance Care Planning: DNR  Prognosis:  Anticipate death following extubation  Discharge Planning: Anticipated Hospital Death      Primary Diagnoses: Present on Admission: **None**  I have reviewed the medical record, interviewed the patient and family, and examined the patient. The following aspects are  pertinent.  Past Medical History:  Diagnosis Date   Anxiety    CAD (coronary artery disease)     (Nonobstructive coronary artery disease 40% LAD stenosis)  2011   Cancer (HCC)    gastric   CHF (congestive heart failure) (HCC)    Chronic kidney disease    STAGE 4 KIDNEY DISEASE   Depression    DJD (degenerative joint disease)    DM (diabetes mellitus) (HCC)    GERD (gastroesophageal reflux disease)    H/O hiatal hernia    HTN (hypertension)    Hypothyroidism    OSA (obstructive sleep apnea)    doesn't wear CPAP   Stroke [facility])    Social History   Socioeconomic History   Marital status: Married    Spouse name: Not on file   Number of children: 3   Years of education: 12th   Highest education level: Not on file  Occupational History   Occupation: retired  Tobacco Use   Smoking status: Never   Smokeless tobacco: Never  Vaping Use   Vaping Use: Never used  Substance and Sexual Activity   Alcohol use: No    Alcohol/week: 0.0 standard drinks   Drug use: No   Sexual activity: Never  Other Topics Concern   Not on file  Social History Narrative   Patient is married with 3 children.   Patient is right handed.   Patient has 12 th grade education.   Patient drinks 1 soda occ.   Social Determinants of Health   Financial Resource Strain: Not on file  Food Insecurity: Not on file  Transportation Needs: Not on file  Physical Activity: Not on file  Stress: Not on file  Social Connections: Not on file   Family History  Problem Relation Age of Onset   Breast cancer Mother    Breast cancer Sister    Scheduled Meds:  sodium chloride   Intravenous Once   bisacodyl  10 mg Rectal Daily   chlorhexidine gluconate (MEDLINE KIT)  15 mL Mouth Rinse BID   Chlorhexidine Gluconate Cloth  6 each Topical Daily   ipratropium  0.5 mg Nebulization Q6H   levalbuterol  0.63 mg Nebulization Q6H   levothyroxine  75 mcg Intravenous Daily   mouth rinse  15 mL Mouth Rinse 10 times per day    metoCLOPramide (REGLAN) injection  10 mg Intravenous Q8H   pantoprazole (PROTONIX) IV  40 mg Intravenous Daily   sodium chloride flush  10-40 mL Intracatheter Q12H   Continuous Infusions:  sodium chloride Stopped (11/14/20 1815)   cefTRIAXone (ROCEPHIN)  IV     epinephrine 5 mcg/min (11/18/20 1500)   fentaNYL infusion INTRAVENOUS 200 mcg/hr (11/18/20 1500)   insulin 7.5 Units/hr (11/18/20 1721)   levETIRAcetam Stopped (11/18/20 4268)   metronidazole 500 mg (11/18/20 1539)   midazolam 4.5 mg/hr (11/18/20 1616)   norepinephrine (LEVOPHED) Adult infusion Stopped (11/18/20 0741)   TPN ADULT (ION) 75 mL/hr at 11/18/20 1500   TPN ADULT (ION) 75 mL/hr at 11/18/20 1706   vasopressin 0.02 Units/min (11/18/20 1500)   PRN Meds:.acetaminophen (TYLENOL) oral liquid 160 mg/5 mL, bisacodyl, dextrose, fentaNYL, fentaNYL (SUBLIMAZE) injection, fentaNYL (SUBLIMAZE) injection, hydrALAZINE, ipratropium-albuterol, labetalol, midazolam, midazolam, midazolam, ondansetron **OR** ondansetron (ZOFRAN) IV, sodium chloride flush Allergies  Allergen Reactions   Codeine Itching   Meperidine Hcl Itching   Morphine Itching   Nylon Other (  See Comments)    Stitches cause infection   Penicillins Hives, Itching and Rash    Tolerated cefepime and ceftriaxone Has patient had a PCN reaction causing immediate rash, facial/tongue/throat swelling, SOB or lightheadedness with hypotension: Yes Has patient had a PCN reaction causing severe rash involving mucus membranes or skin necrosis: Yes Has patient had a PCN reaction that required hospitalization: No Has patient had a PCN reaction occurring within the last 10 years: No If all of the above answers are "NO", then may proceed with Cephalosporin use.    Review of Systems  Unable to perform ROS: Intubated   Physical Exam Constitutional:      Comments: Intubated, sedated, no interaction or response  Cardiovascular:     Rate and Rhythm: Normal rate.  Skin:     General: Skin is warm and dry.    Vital Signs: BP (!) 156/53 Comment: aline reading  Pulse 90   Temp 97.9 F (36.6 C) (Oral)   Resp (!) 34   Ht _0  (1.499 m)   Wt 103.6 kg   SpO2 96%   BMI 46.13 kg/m  Pain Scale: CPOT   Pain Score: 0-No pain   SpO2: SpO2: 96 % O2 Device:SpO2: 96 % O2 Flow Rate: .O2 Flow Rate (L/min): 3 L/min  IO: Intake/output summary:  Intake/Output Summary (Last 24 hours) at 11/18/2020 1722 Last data filed at 11/18/2020 1500 Gross per 24 hour  Intake 3359.12 ml  Output 92 ml  Net 3267.12 ml    LBM: Last BM Date: 11/16/20 Baseline Weight: Weight: 94.8 kg Most recent weight: Weight: 103.6 kg      Time Total: 75 minutes Greater than 50%  of this time was spent counseling and coordinating care related to the above assessment and plan.  Juel Burrow, DNP, AGNP-C Palliative Medicine Team [phone] Pager: [phone]

## 2020-11-18 NOTE — Progress Notes (Signed)
RT NOTE: RT and RN X 5 placed patient back in supine position. Patient tolerated well. Cloth tape removed and ETT holder placed. ETT secure at 22 at the lips. New skin breakdown noted on top of nose. Same small area of breakdown beside nose on left cheek. Same area of skin breakdown under chin noted, this is still covered with mepilex protective pad. Vitals are stable. ABG to be obtained in one hour. RT will continue to monitor.

## 2020-11-18 NOTE — Progress Notes (Addendum)
PHARMACY - TOTAL PARENTERAL NUTRITION CONSULT NOTE  Indication: Partial gastrectomy and TF intolerance   Patient Measurements: Height: _0  (149.9 cm) Weight: 103.6 kg (228 lb 6.3 oz) IBW/kg (Calculated) : 43.2 TPN AdjBW (KG): 56.1 Body mass index is 46.13 kg/m. Usual Weight: 95 kg  Assessment:  75 yo W admitted for 6/28 elective distal gastrectomy for malignant polyp. Patient had a 6 minute code on 7/5 with severe respiratory failure and shock. Patient had an ileus and presumed functional gastric outlet obstruction post-op.  Patient tolerated TF 7/5-7/7 at 20 ml/hr, 7/7- 7/8 at 30 ml/hr, 7/9-7/10 at 40 ml/hr (goal 55 ml/hr). TF were held due to access issues then restarted 7/12 at 20-30 ml/hr. TF were stopped 7/13 for large volume bilious emesis that does not look like TF. Weight unchanged from usual but had anasarca earlier this admit, now resolved. Concern of efferent limb obstruction - likely plan for revision once stabilized. Patient with PEA arrest on 7/20 during NGT placement and is continued on epinephrine, norepinephrine and vasopressin.   Glucose / Insulin: hx DM on glipizide PTA -CBGs trended up with TPN >> improved with insulin added to TPN (~60 units) -D5W at 50/hr 7/18 > 7/20 for hyperNa with Novolog 4units Q4H ordered but none given -CBGs significant elevated with addition of stress steroids on 7/20 -Insulin gtt started 7/21.  TPN contains 70 units insulin while on insulin gtt -CBGs approaching goal 7/22 AM Electrolytes: Na down to 139 (no Na in TPN since 7/16), K 5.8 from ABG likely inaccurate > 3.9 on CMP, CoCa up to 9.1 post 1gm and Ca added to TPN, Mag 2.3 post 3gm, others WNL Renal:  AKI - SCr up 2.85 (BL 1-1.1), BUN up to 97 Not a great candidate for dialysis per CCM GI/Hepatic: AST/ALT down to 67/30, alk phos WNL, tbili up to 3.2 (sclera jaundice on 7/15, no jaundice 7/19 per RN, slight jaundice in sclera again on 7/21). Albumin 2.5 Prealbumin down to 7.3.  TG  WNL. Intake / Output; MIVF:  UOP 122m despite Lasix 858mIV on 7/21. NG removed. LBM 7/20.  Net +11.9L Pertinent GI Imaging: 7/11 CT - wall thickening at GJAlbertanastomosis, no evidence of outlet obstruction 7/21 Abd XR - slight improved aeration GI Surgeries / Procedures:  6/28 elective distal gastrectomy 7/15 EGD - normal esophagus, jejunal stenosis, antrectomy found  Central access: triple IJ placed 11/01/20, PICC placed 11/10/20 TPN start date: 11/10/20  Nutritional Goals (per RD rec on 7/21): kCal: 1800-2000, Protein: 100-120, Fluid: >/= 1.8 L/d  Current Nutrition:  TPN Off TF 7/13  Plan:  Continue TPN at goal rate 75 ml/hr to provide 108g AA, 252g CHO and 54g ILE for a total of 1829 kCal, meeting 100% of patient needs Electrolytes in TPN: Na 61m72mL, K 461m59m, Ca 3mEq57m(added 7/21), Mag 5mEq/71mincr 7/21), Phos 161mmol56mmax acetate - no change today Continue 70 units regular insulin in TPN Off steroid per CCM - wean off insulin infusion as able.  No plan for long-acting insulin with insulin in TPN. Add standard MVI and 1/2 trace elements to TPN Standard TPN labs on Mon/Thurs, BMET in AM with worsening renal fxn F/U GoC discussion, volume status, tbili/jaundice, CBGs  [name], PMina MarbleD, BCPS, BCCCP [id number], 10:19 AM

## 2020-11-18 NOTE — Progress Notes (Signed)
OT Cancellation Note  Patient Details Name: ZONIA HORWEDEL MRN: [id number] DOB: [birth year]   Cancelled Treatment:    Reason Eval/Treat Not Completed: Medical issues which prohibited therapy;Patient not medically ready- pt intubated and not medically ready at this point.  OT will sign off. Please re-consult when appropriate.   Jolaine Artist, OT Acute Rehabilitation Services Pager [phone] Office [phone]   Delight Stare 11/18/2020, 10:34 AM

## 2020-11-18 NOTE — Progress Notes (Signed)
Patient ID: [name], female   DOB: 13-May-1945, 75 y.o.   MRN: [id number] [facility] Surgery Progress Note:     Subjective: Family meeting today with CCM made pt DNR and no HD.  She remains oliguric.  Has come down on pressors a bit.    Objective: Vital signs in last 24 hours: Temp:  [98.1 F (36.7 C)-98.8 F (37.1 C)] 98.1 F (36.7 C) (07/22 1202) Pulse Rate:  [87-95] 90 (07/22 1514) Resp:  [17-35] 34 (07/22 1514) BP: (141-156)/(47-54) 156/53 (07/22 1514) SpO2:  [81 %-100 %] 96 % (07/22 1515) Arterial Line BP: (97-184)/(40-81) 167/58 (07/22 1400) FiO2 (%):  [80 %-100 %] 80 % (07/22 1515) Weight:  [103.6 kg] 103.6 kg (07/22 0500)  Intake/Output from previous day: 07/21 0701 - 07/22 0700 In: 3897.6 [I.V.:3128.3; IV Piggyback:769.2] Out: 147 [Urine:147] Intake/Output this shift: Total I/O In: 1196.7 [I.V.:993.7; IV Piggyback:203] Out: -    Physical Exam:  Sedated, intubated.   Resp:  comfortable on vent, synchronous CV regular rate and rhythm Abd soft, non tender, non distended.  Incisions healing well without evidence of infection.   Family at bedside.    Lab Results:  Results for orders placed or performed during the hospital encounter of 10/27/2020 (from the past 48 hour(s))  Glucose, capillary     Status: Abnormal   Collection Time: 11/16/20  4:05 PM  Result Value Ref Range   Glucose-Capillary 266 (H) 70 - 99 mg/dL    Comment: Glucose reference range applies only to samples taken after fasting for at least 8 hours.  Prepare RBC (crossmatch)     Status: None   Collection Time: 11/16/20  4:10 PM  Result Value Ref Range   Order Confirmation      ORDER PROCESSED BY BLOOD BANK Performed at [facility], [place] [address], [place], [place] [address]   Type and screen [place]     Status: None   Collection Time: 11/16/20  4:31 PM  Result Value Ref Range   ABO/RH(D) A NEG    Antibody Screen NEG    Sample Expiration  11/19/2020,2359    Unit Number [id number]    Blood Component Type RED CELLS,LR    Unit division 00    Status of Unit ISSUED,FINAL    Transfusion Status OK TO TRANSFUSE    Crossmatch Result      Compatible Performed at [facility], [place] [address] [place], [place] [address]   Hemoglobin and hematocrit, blood     Status: Abnormal   Collection Time: 11/16/20  8:13 PM  Result Value Ref Range   Hemoglobin 8.6 (L) 12.0 - 15.0 g/dL    Comment: REPEATED TO VERIFY POST TRANSFUSION SPECIMEN    HCT 29.6 (L) 36.0 - 46.0 %    Comment: Performed at [facility] [address], [place], [place] [address]  Comprehensive metabolic panel     Status: Abnormal   Collection Time: 11/16/20  8:13 PM  Result Value Ref Range   Sodium 146 (H) 135 - 145 mmol/L   Potassium 3.3 (L) 3.5 - 5.1 mmol/L   Chloride 106 98 - 111 mmol/L   CO2 23 22 - 32 mmol/L   Glucose, Bld 239 (H) 70 - 99 mg/dL    Comment: Glucose reference range applies only to samples taken after fasting for at least 8 hours.   BUN 64 (H) 8 - 23 mg/dL   Creatinine, Ser 2.08 (H) 0.44 - 1.00 mg/dL  Comment: DELTA CHECK NOTED   Calcium 7.8 (L) 8.9 - 10.3 mg/dL   Total Protein 5.4 (L) 6.5 - 8.1 g/dL   Albumin 2.5 (L) 3.5 - 5.0 g/dL   AST 105 (H) 15 - 41 U/L   ALT 30 0 - 44 U/L   Alkaline Phosphatase 106 38 - 126 U/L   Total Bilirubin 2.5 (H) 0.3 - 1.2 mg/dL   GFR, Estimated 24 (L) >60 mL/min    Comment: (NOTE) Calculated using the CKD-EPI Creatinine Equation (2021)    Anion gap 17 (H) 5 - 15    Comment: Performed at [facility], [place] [address] [place], [place] [address]  Glucose, capillary     Status: Abnormal   Collection Time: 11/16/20  8:26 PM  Result Value Ref Range   Glucose-Capillary 226 (H) 70 - 99 mg/dL    Comment: Glucose reference range applies only to samples taken after fasting for at least 8 hours.  I-STAT 7, (LYTES, BLD GAS, ICA, H+H)     Status: Abnormal   Collection Time: 11/16/20  8:27 PM   Result Value Ref Range   pH, Arterial 7.321 (L) 7.350 - 7.450   pCO2 arterial 48.0 32.0 - 48.0 mmHg   pO2, Arterial 58 (L) 83.0 - 108.0 mmHg   Bicarbonate 24.8 20.0 - 28.0 mmol/L   TCO2 26 22 - 32 mmol/L   O2 Saturation 87.0 %   Acid-base deficit 1.0 0.0 - 2.0 mmol/L   Sodium 150 (H) 135 - 145 mmol/L   Potassium 3.2 (L) 3.5 - 5.1 mmol/L   Calcium, Ion 1.07 (L) 1.15 - 1.40 mmol/L   HCT 29.0 (L) 36.0 - 46.0 %   Hemoglobin 9.9 (L) 12.0 - 15.0 g/dL   Collection site art line    Drawn by RT    Sample type ARTERIAL   Glucose, capillary     Status: Abnormal   Collection Time: 11/16/20 11:39 PM  Result Value Ref Range   Glucose-Capillary 222 (H) 70 - 99 mg/dL    Comment: Glucose reference range applies only to samples taken after fasting for at least 8 hours.  Comprehensive metabolic panel     Status: Abnormal   Collection Time: 11/17/20  3:56 AM  Result Value Ref Range   Sodium 146 (H) 135 - 145 mmol/L   Potassium 3.9 3.5 - 5.1 mmol/L   Chloride 103 98 - 111 mmol/L   CO2 26 22 - 32 mmol/L   Glucose, Bld 286 (H) 70 - 99 mg/dL    Comment: Glucose reference range applies only to samples taken after fasting for at least 8 hours.   BUN 76 (H) 8 - 23 mg/dL   Creatinine, Ser 2.33 (H) 0.44 - 1.00 mg/dL    Comment: DELTA CHECK NOTED   Calcium 7.6 (L) 8.9 - 10.3 mg/dL   Total Protein 5.3 (L) 6.5 - 8.1 g/dL   Albumin 2.9 (L) 3.5 - 5.0 g/dL   AST 109 (H) 15 - 41 U/L   ALT 32 0 - 44 U/L   Alkaline Phosphatase 60 38 - 126 U/L   Total Bilirubin 2.9 (H) 0.3 - 1.2 mg/dL   GFR, Estimated 21 (L) >60 mL/min    Comment: (NOTE) Calculated using the CKD-EPI Creatinine Equation (2021)    Anion gap 17 (H) 5 - 15    Comment: Performed at [facility], [place] [address] [place], [place] [address]  Magnesium     Status: None   Collection Time:  11/17/20  3:56 AM  Result Value Ref Range   Magnesium 1.7 1.7 - 2.4 mg/dL    Comment: Performed at [facility] [address] [place],  [place] [address]  Phosphorus     Status: None   Collection Time: 11/17/20  3:56 AM  Result Value Ref Range   Phosphorus 3.8 2.5 - 4.6 mg/dL    Comment: Performed at [facility] [address], [place], [place] [address]  CBC     Status: Abnormal   Collection Time: 11/17/20  3:56 AM  Result Value Ref Range   WBC 38.5 (H) 4.0 - 10.5 K/uL   RBC 2.84 (L) 3.87 - 5.11 MIL/uL   Hemoglobin 8.3 (L) 12.0 - 15.0 g/dL   HCT 26.3 (L) 36.0 - 46.0 %   MCV 92.6 80.0 - 100.0 fL    Comment: REPEATED TO VERIFY POST TRANSFUSION SPECIMEN DELTA CHECK NOTED    MCH 29.2 26.0 - 34.0 pg   MCHC 31.6 30.0 - 36.0 g/dL   RDW 18.9 (H) 11.5 - 15.5 %   Platelets 157 150 - 400 K/uL   nRBC 2.3 (H) 0.0 - 0.2 %    Comment: Performed at [facility], [place] [address], [place], [place] [address]  Glucose, capillary     Status: Abnormal   Collection Time: 11/17/20  4:19 AM  Result Value Ref Range   Glucose-Capillary 275 (H) 70 - 99 mg/dL    Comment: Glucose reference range applies only to samples taken after fasting for at least 8 hours.  I-STAT 7, (LYTES, BLD GAS, ICA, H+H)     Status: Abnormal   Collection Time: 11/17/20  5:34 AM  Result Value Ref Range   pH, Arterial 7.449 7.350 - 7.450   pCO2 arterial 44.8 32.0 - 48.0 mmHg   pO2, Arterial 209 (H) 83.0 - 108.0 mmHg   Bicarbonate 31.1 (H) 20.0 - 28.0 mmol/L   TCO2 32 22 - 32 mmol/L   O2 Saturation 100.0 %   Acid-Base Excess 6.0 (H) 0.0 - 2.0 mmol/L   Sodium 145 135 - 145 mmol/L   Potassium 3.7 3.5 - 5.1 mmol/L   Calcium, Ion 1.00 (L) 1.15 - 1.40 mmol/L   HCT 28.0 (L) 36.0 - 46.0 %   Hemoglobin 9.5 (L) 12.0 - 15.0 g/dL   Patient temperature 98.9 F    Collection site Radial    Drawn by Nurse    Sample type ARTERIAL   Lactic acid, plasma     Status: Abnormal   Collection Time: 11/17/20  5:39 AM  Result Value Ref Range   Lactic Acid, Venous 6.7 (HH) 0.5 - 1.9 mmol/L    Comment: CRITICAL VALUE NOTED.  VALUE IS CONSISTENT WITH PREVIOUSLY REPORTED AND  CALLED VALUE. Performed at [facility], [place] [address], [place], [place] [address]   Glucose, capillary     Status: Abnormal   Collection Time: 11/17/20  8:23 AM  Result Value Ref Range   Glucose-Capillary 295 (H) 70 - 99 mg/dL    Comment: Glucose reference range applies only to samples taken after fasting for at least 8 hours.  I-STAT 7, (LYTES, BLD GAS, ICA, H+H)     Status: Abnormal   Collection Time: 11/17/20 11:24 AM  Result Value Ref Range   pH, Arterial 7.386 7.350 - 7.450   pCO2 arterial 51.7 (H) 32.0 - 48.0 mmHg   pO2, Arterial 75 (L) 83.0 - 108.0 mmHg   Bicarbonate 31.0 (H)  20.0 - 28.0 mmol/L   TCO2 33 (H) 22 - 32 mmol/L   O2 Saturation 94.0 %   Acid-Base Excess 5.0 (H) 0.0 - 2.0 mmol/L   Sodium 143 135 - 145 mmol/L   Potassium 3.8 3.5 - 5.1 mmol/L   Calcium, Ion 1.03 (L) 1.15 - 1.40 mmol/L   HCT 24.0 (L) 36.0 - 46.0 %   Hemoglobin 8.2 (L) 12.0 - 15.0 g/dL   Sample type ARTERIAL   Lactic acid, plasma     Status: Abnormal   Collection Time: 11/17/20 11:30 AM  Result Value Ref Range   Lactic Acid, Venous 5.5 (HH) 0.5 - 1.9 mmol/L    Comment: CRITICAL VALUE NOTED.  VALUE IS CONSISTENT WITH PREVIOUSLY REPORTED AND CALLED VALUE. Performed at [facility], [place] [address] [place], [place] [address]   Glucose, capillary     Status: Abnormal   Collection Time: 11/17/20 11:41 AM  Result Value Ref Range   Glucose-Capillary 342 (H) 70 - 99 mg/dL    Comment: Glucose reference range applies only to samples taken after fasting for at least 8 hours.  Fungus culture, blood     Status: None (Preliminary result)   Collection Time: 11/17/20 12:13 PM   Specimen: BLOOD  Result Value Ref Range   Specimen Description BLOOD RIGHT ANTECUBITAL    Special Requests      BOTTLES DRAWN AEROBIC AND ANAEROBIC Blood Culture adequate volume   Culture      NO GROWTH < 24 HOURS Performed at [facility], [place] [address], [place], [place] [address]    Report Status PENDING    I-STAT 7, (LYTES, BLD GAS, ICA, H+H)     Status: Abnormal   Collection Time: 11/17/20  1:39 PM  Result Value Ref Range   pH, Arterial 7.307 (L) 7.350 - 7.450   pCO2 arterial 64.2 (H) 32.0 - 48.0 mmHg   pO2, Arterial 94 83.0 - 108.0 mmHg   Bicarbonate 32.0 (H) 20.0 - 28.0 mmol/L   TCO2 34 (H) 22 - 32 mmol/L   O2 Saturation 96.0 %   Acid-Base Excess 5.0 (H) 0.0 - 2.0 mmol/L   Sodium 143 135 - 145 mmol/L   Potassium 4.1 3.5 - 5.1 mmol/L   Calcium, Ion 1.11 (L) 1.15 - 1.40 mmol/L   HCT 23.0 (L) 36.0 - 46.0 %   Hemoglobin 7.8 (L) 12.0 - 15.0 g/dL   Patient temperature 98.9 F    Collection site Magazine features editor by Operator    Sample type ARTERIAL   Glucose, capillary     Status: Abnormal   Collection Time: 11/17/20  4:33 PM  Result Value Ref Range   Glucose-Capillary 338 (H) 70 - 99 mg/dL    Comment: Glucose reference range applies only to samples taken after fasting for at least 8 hours.  Glucose, capillary     Status: Abnormal   Collection Time: 11/17/20  5:52 PM  Result Value Ref Range   Glucose-Capillary 316 (H) 70 - 99 mg/dL    Comment: Glucose reference range applies only to samples taken after fasting for at least 8 hours.  Glucose, capillary     Status: Abnormal   Collection Time: 11/17/20  7:05 PM  Result Value Ref Range   Glucose-Capillary 272 (H) 70 - 99 mg/dL    Comment: Glucose reference range applies only to samples taken after fasting for at least 8 hours.  Glucose, capillary     Status: Abnormal  Collection Time: 11/17/20  8:16 PM  Result Value Ref Range   Glucose-Capillary 230 (H) 70 - 99 mg/dL    Comment: Glucose reference range applies only to samples taken after fasting for at least 8 hours.  Glucose, capillary     Status: Abnormal   Collection Time: 11/17/20  9:47 PM  Result Value Ref Range   Glucose-Capillary 204 (H) 70 - 99 mg/dL    Comment: Glucose reference range applies only to samples taken after fasting for at least 8 hours.  Glucose, capillary      Status: Abnormal   Collection Time: 11/17/20 11:16 PM  Result Value Ref Range   Glucose-Capillary 194 (H) 70 - 99 mg/dL    Comment: Glucose reference range applies only to samples taken after fasting for at least 8 hours.  Glucose, capillary     Status: Abnormal   Collection Time: 11/18/20  1:21 AM  Result Value Ref Range   Glucose-Capillary 181 (H) 70 - 99 mg/dL    Comment: Glucose reference range applies only to samples taken after fasting for at least 8 hours.  Glucose, capillary     Status: Abnormal   Collection Time: 11/18/20  3:04 AM  Result Value Ref Range   Glucose-Capillary 191 (H) 70 - 99 mg/dL    Comment: Glucose reference range applies only to samples taken after fasting for at least 8 hours.  CBC     Status: Abnormal   Collection Time: 11/18/20  3:15 AM  Result Value Ref Range   WBC 33.3 (H) 4.0 - 10.5 K/uL   RBC 2.48 (L) 3.87 - 5.11 MIL/uL   Hemoglobin 7.0 (L) 12.0 - 15.0 g/dL   HCT 23.5 (L) 36.0 - 46.0 %   MCV 94.8 80.0 - 100.0 fL   MCH 28.2 26.0 - 34.0 pg   MCHC 29.8 (L) 30.0 - 36.0 g/dL   RDW 19.2 (H) 11.5 - 15.5 %   Platelets 107 (L) 150 - 400 K/uL    Comment: Immature Platelet Fraction may be clinically indicated, consider ordering this additional test [id number] REPEATED TO VERIFY PLATELET COUNT CONFIRMED BY SMEAR    nRBC 0.6 (H) 0.0 - 0.2 %    Comment: Performed at [facility], [place] [address], [place], [place] [address]  Comprehensive metabolic panel     Status: Abnormal   Collection Time: 11/18/20  3:15 AM  Result Value Ref Range   Sodium 139 135 - 145 mmol/L   Potassium 3.9 3.5 - 5.1 mmol/L   Chloride 101 98 - 111 mmol/L   CO2 29 22 - 32 mmol/L   Glucose, Bld 188 (H) 70 - 99 mg/dL    Comment: Glucose reference range applies only to samples taken after fasting for at least 8 hours.   BUN 97 (H) 8 - 23 mg/dL   Creatinine, Ser 2.85 (H) 0.44 - 1.00 mg/dL   Calcium 7.9 (L) 8.9 - 10.3 mg/dL   Total Protein 5.2 (L) 6.5 - 8.1 g/dL   Albumin 2.5  (L) 3.5 - 5.0 g/dL   AST 67 (H) 15 - 41 U/L   ALT 30 0 - 44 U/L   Alkaline Phosphatase 67 38 - 126 U/L   Total Bilirubin 3.2 (H) 0.3 - 1.2 mg/dL   GFR, Estimated 17 (L) >60 mL/min    Comment: (NOTE) Calculated using the CKD-EPI Creatinine Equation (2021)    Anion gap 9 5 - 15    Comment: Performed at [facility], [place]  [address] [place], [place] [address]  Magnesium     Status: None   Collection Time: 11/18/20  3:15 AM  Result Value Ref Range   Magnesium 2.3 1.7 - 2.4 mg/dL    Comment: Performed at [facility] [address], [place], [place] [address]  Phosphorus     Status: None   Collection Time: 11/18/20  3:15 AM  Result Value Ref Range   Phosphorus 3.7 2.5 - 4.6 mg/dL    Comment: Performed at [facility] [address], [place], [place] [address]  Lactic acid, plasma     Status: Abnormal   Collection Time: 11/18/20  3:16 AM  Result Value Ref Range   Lactic Acid, Venous 2.2 (HH) 0.5 - 1.9 mmol/L    Comment: CRITICAL VALUE NOTED.  VALUE IS CONSISTENT WITH PREVIOUSLY REPORTED AND CALLED VALUE. Performed at [facility], [place] [address] [place], [place] [address]   Glucose, capillary     Status: Abnormal   Collection Time: 11/18/20  4:23 AM  Result Value Ref Range   Glucose-Capillary 171 (H) 70 - 99 mg/dL    Comment: Glucose reference range applies only to samples taken after fasting for at least 8 hours.  Glucose, capillary     Status: Abnormal   Collection Time: 11/18/20  5:23 AM  Result Value Ref Range   Glucose-Capillary 177 (H) 70 - 99 mg/dL    Comment: Glucose reference range applies only to samples taken after fasting for at least 8 hours.  I-STAT 7, (LYTES, BLD GAS, ICA, H+H)     Status: Abnormal   Collection Time: 11/18/20  5:26 AM  Result Value Ref Range   pH, Arterial 7.332 (L) 7.350 - 7.450   pCO2 arterial 62.7 (H) 32.0 - 48.0 mmHg   pO2, Arterial 95 83.0 - 108.0 mmHg   Bicarbonate 33.3 (H) 20.0 - 28.0 mmol/L   TCO2 35 (H) 22 - 32  mmol/L   O2 Saturation 97.0 %   Acid-Base Excess 6.0 (H) 0.0 - 2.0 mmol/L   Sodium 139 135 - 145 mmol/L   Potassium 5.8 (H) 3.5 - 5.1 mmol/L   Calcium, Ion 1.08 (L) 1.15 - 1.40 mmol/L   HCT 24.0 (L) 36.0 - 46.0 %   Hemoglobin 8.2 (L) 12.0 - 15.0 g/dL   Patient temperature 98.1 F    Sample type ARTERIAL   Glucose, capillary     Status: Abnormal   Collection Time: 11/18/20  6:52 AM  Result Value Ref Range   Glucose-Capillary 191 (H) 70 - 99 mg/dL    Comment: Glucose reference range applies only to samples taken after fasting for at least 8 hours.  Glucose, capillary     Status: Abnormal   Collection Time: 11/18/20  8:01 AM  Result Value Ref Range   Glucose-Capillary 172 (H) 70 - 99 mg/dL    Comment: Glucose reference range applies only to samples taken after fasting for at least 8 hours.  Glucose, capillary     Status: Abnormal   Collection Time: 11/18/20  9:39 AM  Result Value Ref Range   Glucose-Capillary 192 (H) 70 - 99 mg/dL    Comment: Glucose reference range applies only to samples taken after fasting for at least 8 hours.  Glucose, capillary     Status: Abnormal   Collection Time: 11/18/20 11:52 AM  Result Value Ref Range   Glucose-Capillary 163 (H) 70 - 99 mg/dL    Comment: Glucose reference range applies only to  samples taken after fasting for at least 8 hours.  I-STAT 7, (LYTES, BLD GAS, ICA, H+H)     Status: Abnormal   Collection Time: 11/18/20 12:36 PM  Result Value Ref Range   pH, Arterial 7.317 (L) 7.350 - 7.450   pCO2 arterial 59.3 (H) 32.0 - 48.0 mmHg   pO2, Arterial 82 (L) 83.0 - 108.0 mmHg   Bicarbonate 30.2 (H) 20.0 - 28.0 mmol/L   TCO2 32 22 - 32 mmol/L   O2 Saturation 94.0 %   Acid-Base Excess 4.0 (H) 0.0 - 2.0 mmol/L   Sodium 139 135 - 145 mmol/L   Potassium 4.2 3.5 - 5.1 mmol/L   Calcium, Ion 1.15 1.15 - 1.40 mmol/L   HCT 21.0 (L) 36.0 - 46.0 %   Hemoglobin 7.1 (L) 12.0 - 15.0 g/dL   Patient temperature 37.3 C    Sample type ARTERIAL   Glucose,  capillary     Status: Abnormal   Collection Time: 11/18/20  1:34 PM  Result Value Ref Range   Glucose-Capillary 162 (H) 70 - 99 mg/dL    Comment: Glucose reference range applies only to samples taken after fasting for at least 8 hours.  Basic metabolic panel     Status: Abnormal   Collection Time: 11/18/20  2:00 PM  Result Value Ref Range   Sodium 138 135 - 145 mmol/L   Potassium 4.3 3.5 - 5.1 mmol/L   Chloride 100 98 - 111 mmol/L   CO2 28 22 - 32 mmol/L   Glucose, Bld 170 (H) 70 - 99 mg/dL    Comment: Glucose reference range applies only to samples taken after fasting for at least 8 hours.   BUN 117 (H) 8 - 23 mg/dL   Creatinine, Ser 3.14 (H) 0.44 - 1.00 mg/dL   Calcium 7.8 (L) 8.9 - 10.3 mg/dL   GFR, Estimated 15 (L) >60 mL/min    Comment: (NOTE) Calculated using the CKD-EPI Creatinine Equation (2021)    Anion gap 10 5 - 15    Comment: Performed at [facility] [address], [place], [place] [address]  CBC     Status: Abnormal   Collection Time: 11/18/20  2:00 PM  Result Value Ref Range   WBC 32.4 (H) 4.0 - 10.5 K/uL   RBC 2.33 (L) 3.87 - 5.11 MIL/uL   Hemoglobin 6.7 (LL) 12.0 - 15.0 g/dL    Comment: REPEATED TO VERIFY THIS CRITICAL RESULT HAS VERIFIED AND BEEN CALLED TO K DALISAY RN BY KIRSTENE FORSYTH ON 07 22 2022 AT [id number], AND HAS BEEN READ BACK.     HCT 22.1 (L) 36.0 - 46.0 %   MCV 94.8 80.0 - 100.0 fL   MCH 28.8 26.0 - 34.0 pg   MCHC 30.3 30.0 - 36.0 g/dL   RDW 19.0 (H) 11.5 - 15.5 %   Platelets 89 (L) 150 - 400 K/uL    Comment: Immature Platelet Fraction may be clinically indicated, consider ordering this additional test [id number] CONSISTENT WITH PREVIOUS RESULT REPEATED TO VERIFY    nRBC 0.4 (H) 0.0 - 0.2 %    Comment: Performed at [facility], [place] [address] [place], [place] [address]    Radiology/Results: DG Abd 1 View  Result Date: 11/18/2020 CLINICAL DATA:  ARDS.  Ileus. EXAM: ABDOMEN - 1 VIEW COMPARISON:  One-view abdomen 10/11/2020  one-view chest x-ray 11/18/2020 FINDINGS: NG tube demonstrated on the chest x-ray is not imaged on this film. Relatively gasless abdomen noted. No bowel  distension present. No evidence for free air. IMPRESSION: Relatively gasless abdomen without evidence for obstruction or free air. Electronically Signed   By: San Morelle M.D.   On: 11/18/2020 11:30   DG Chest Port 1 View  Result Date: 11/18/2020 CLINICAL DATA:  ARDS, ileus EXAM: PORTABLE CHEST 1 VIEW COMPARISON:  Portable exam 1102 hours compared to 1023 hours FINDINGS: Tip of endotracheal tube projects 1.6 cm above carina. Nasogastric tube extends into stomach. LEFT jugular line with tip projecting over SVC. Stable heart size and mediastinal contours. Atherosclerotic calcification aorta. BILATERAL pulmonary infiltrates unchanged. No pleural effusion or pneumothorax. Bones demineralized with prior cervicothoracic fusion. IMPRESSION: Persistent pulmonary infiltrates. Aortic Atherosclerosis (ICD10-I70.0). Electronically Signed   By: Lavonia Dana M.D.   On: 11/18/2020 14:05   Portable Chest xray  Result Date: 11/17/2020 CLINICAL DATA:  Check endotracheal tube, hypoxia EXAM: PORTABLE CHEST 1 VIEW COMPARISON:  11/16/2020 FINDINGS: Cardiac shadow is stable. Left-sided PICC line, left jugular central line and endotracheal tube are all noted in satisfactory position. Gastric catheter extends to the stomach. Patchy airspace disease is noted bilaterally but improved slightly in the interval from the prior exam. No bony abnormality is noted. IMPRESSION: Slight improved aeration when compare with the previous day. Electronically Signed   By: Inez Catalina M.D.   On: 11/17/2020 12:02   DG Chest Port 1 View  Result Date: 11/16/2020 CLINICAL DATA:  Endotracheal tube positioning while patient is prone. ARDS. EXAM: PORTABLE CHEST 1 VIEW COMPARISON:  Earlier today. FINDINGS: Magdalene Molly is difficult to define, the tip of the enteric tube is just beyond the level of the  clavicular heads. Enteric tube in place with tip below the diaphragm. Left upper extremity PICC and left internal jugular central line remain in place. Worsening diffuse airspace disease throughout the right hemithorax. Patchy airspace disease on the left is similar or worsening. Grossly stable heart size and mediastinal contours, partially obscured by perihilar opacities. IMPRESSION: 1. The tip of the enteric tube is just beyond the level of the clavicular heads. Other support apparatus is stable in position. 2. Worsening diffuse airspace disease throughout the right hemithorax. Patchy airspace disease on the left is similar or worsening. Electronically Signed   By: Keith Rake M.D.   On: 11/16/2020 20:48    Anti-infectives: Anti-infectives (From admission, onward)    Start     Dose/Rate Route Frequency Ordered Stop   11/18/20 2100  cefTRIAXone (ROCEPHIN) 2 g in sodium chloride 0.9 % 100 mL IVPB        2 g 200 mL/hr over 30 Minutes Intravenous Every 24 hours 11/18/20 0830     11/18/20 1200  anidulafungin (ERAXIS) 100 mg in sodium chloride 0.9 % 100 mL IVPB  Status:  Discontinued        100 mg 78 mL/hr over 100 Minutes Intravenous Every 24 hours 11/17/20 1104 11/18/20 0827   11/17/20 2121  ceFEPIme (MAXIPIME) 2 g in sodium chloride 0.9 % 100 mL IVPB  Status:  Discontinued        2 g 200 mL/hr over 30 Minutes Intravenous Every 24 hours 11/16/20 2134 11/18/20 0829   11/17/20 1615  metroNIDAZOLE (FLAGYL) IVPB 500 mg        500 mg 100 mL/hr over 60 Minutes Intravenous Every 8 hours 11/17/20 0812     11/17/20 1430  anidulafungin (ERAXIS) 200 mg in sodium chloride 0.9 % 200 mL IVPB        200 mg 78 mL/hr over 200 Minutes Intravenous  Once 11/17/20 1424 11/17/20 1850   11/17/20 1200  anidulafungin (ERAXIS) 200 mg in sodium chloride 0.9 % 200 mL IVPB  Status:  Discontinued        200 mg 78 mL/hr over 200 Minutes Intravenous Every 24 hours 11/17/20 1104 11/17/20 1424   11/16/20 1000  ceFEPIme  (MAXIPIME) 2 g in sodium chloride 0.9 % 100 mL IVPB  Status:  Discontinued        2 g 200 mL/hr over 30 Minutes Intravenous Every 12 hours 11/16/20 0746 11/16/20 2134   11/15/20 2000  vancomycin (VANCOCIN) IVPB 1000 mg/200 mL premix  Status:  Discontinued        1,000 mg 200 mL/hr over 60 Minutes Intravenous Every 24 hours 11/15/20 0827 11/16/20 0759   11/15/20 1800  ceFEPIme (MAXIPIME) 2 g in sodium chloride 0.9 % 100 mL IVPB  Status:  Discontinued        2 g 200 mL/hr over 30 Minutes Intravenous Every 24 hours 11/15/20 0827 11/16/20 0746   11/14/20 1945  ceFEPIme (MAXIPIME) 2 g in sodium chloride 0.9 % 100 mL IVPB  Status:  Discontinued        2 g 200 mL/hr over 30 Minutes Intravenous Every 12 hours 11/14/20 1853 11/15/20 0827   11/14/20 1945  vancomycin (VANCOCIN) IVPB 1000 mg/200 mL premix  Status:  Discontinued        1,000 mg 200 mL/hr over 60 Minutes Intravenous Every 24 hours 11/14/20 1855 11/15/20 0827   11/14/20 1615  cefTRIAXone (ROCEPHIN) 2 g in sodium chloride 0.9 % 100 mL IVPB  Status:  Discontinued        2 g 200 mL/hr over 30 Minutes Intravenous Every 24 hours 11/14/20 1521 11/14/20 1802   11/14/20 1615  metroNIDAZOLE (FLAGYL) IVPB 500 mg  Status:  Discontinued        500 mg 100 mL/hr over 60 Minutes Intravenous Every 8 hours 11/14/20 1521 11/17/20 0812   11/04/20 1245  cefTRIAXone (ROCEPHIN) 2 g in sodium chloride 0.9 % 100 mL IVPB        2 g 200 mL/hr over 30 Minutes Intravenous Every 24 hours 11/04/20 1148 11/06/20 1302   11/02/20 1000  cefTRIAXone (ROCEPHIN) 2 g in sodium chloride 0.9 % 100 mL IVPB  Status:  Discontinued        2 g 200 mL/hr over 30 Minutes Intravenous Every 24 hours 11/01/20 1257 11/02/20 0856   11/02/20 1000  doxycycline (VIBRAMYCIN) 100 mg in sodium chloride 0.9 % 250 mL IVPB  Status:  Discontinued        100 mg 125 mL/hr over 120 Minutes Intravenous Every 12 hours 11/01/20 1257 11/02/20 0856   11/02/20 1000  ceFEPIme (MAXIPIME) 2 g in sodium  chloride 0.9 % 100 mL IVPB  Status:  Discontinued        2 g 200 mL/hr over 30 Minutes Intravenous Every 12 hours 11/02/20 0856 11/04/20 1148   11/01/20 0915  levofloxacin (LEVAQUIN) IVPB 750 mg  Status:  Discontinued        750 mg 100 mL/hr over 90 Minutes Intravenous Every 24 hours 11/01/20 0827 11/01/20 1257   11/01/20 0730  levofloxacin (LEVAQUIN) IVPB 500 mg  Status:  Discontinued        500 mg 100 mL/hr over 60 Minutes Intravenous Every 24 hours 11/01/20 0631 11/01/20 1257   10/06/2020 2100  ciprofloxacin (CIPRO) IVPB 400 mg        400 mg 200 mL/hr over 60 Minutes Intravenous Every  12 hours 10/23/2020 1258 10/24/2020 2148   10/09/2020 0600  ciprofloxacin (CIPRO) IVPB 400 mg        400 mg 200 mL/hr over 60 Minutes Intravenous On call to O.R. 10/03/2020 0547 10/27/2020 0847       Assessment/Plan: Problem List: Patient Active Problem List   Diagnosis Date Noted   ARDS (adult respiratory distress syndrome) ([place])    Septic shock ([place])    Pressure injury of skin 11/04/2020   Cardiac arrest ([place])    Emesis    Respiratory failure ([facility])    SOB (shortness of breath)    Acute respiratory failure with hypoxemia ([place])    Encounter for central line placement    S/P partial gastrectomy 09/30/2020   Type 2 diabetes mellitus without complication ([place]) [id number]   Hyperlipidemia 03/01/2014   Focal seizures ([place]) 10/21/2013   TIA (transient ischemic attack) 10/21/2013   CHEST PAIN 01/24/2010   BRADYCARDIA 12/23/2009   Diabetes ([place]) 08/04/2008   HYPOTHYROIDISM 03/06/2007   OBESITY 03/06/2007   OBSTRUCTIVE SLEEP APNEA 03/06/2007   HYPERTENSION, BENIGN 03/06/2007   CORONARY ARTERY DISEASE 03/06/2007   ALLERGIC RHINITIS 03/06/2007   DEGENERATIVE JOINT DISEASE, GENERALIZED 03/06/2007   BACK PAIN, CHRONIC 03/06/2007   Now s/p large aspiration and code x 2.  Remains on pressors with another bout of AKI.    Efferent limb obstruction.  If we can stabilize her, this will need to be revised.     TNA for severe protein calorie malnutrition and intolerance of enteric feeding.  Pressors weaning as tolerated.   Antifungals added to broad spectrum antibiotics  Stress dose steroids.   Continue keppra.    Likely poor prognosis given second cardiac arrest. Was making slow improvements prior to this event.   HD unlikely to improve survival.  No HD to be offered.  Palliative care consult pending.     LOS: 24 days   Milus Height, MD FACS Surgical Oncology, General Surgery, Trauma and [facility], [place] for weekday/non holidays Check [email or web address] for coverage night/weekend/holidays  Do not use SecureChat as it is not reliable for timely patient care.     11/18/2020 3:58 PM

## 2020-11-18 NOTE — Progress Notes (Signed)
Spoke with Dr. Barry Dienes: she shares concerns regarding prognosis.  Will try to arrange family meeting with son and husband either today or tomorrow to discuss goals of care.  Erskine Emery MD PCCM

## 2020-11-18 NOTE — Progress Notes (Signed)
[facility] Progress Note Patient Name: [name] DOB: Oct 27, 1945 MRN: [id number]   Date of Service  11/18/2020  HPI/Events of Note  Ventilator asynchrony   eICU Interventions  Plan: Increase ceiling on Fentanyl IV infusion to 300 mcg/hour.     Intervention Category Major Interventions: Respiratory failure - evaluation and management  [name] 11/18/2020, 7:59 PM

## 2020-11-18 NOTE — Progress Notes (Signed)
RT NOTE: Patient's head turned and arms rotated. Sats did drop so FiO2 increased to 100%. Vitals are stable. RT will continue to monitor.

## 2020-11-18 NOTE — Plan of Care (Signed)

## 2020-11-19 DIAGNOSIS — J9601 Acute respiratory failure with hypoxia: Secondary | ICD-10-CM | POA: Diagnosis not present

## 2020-11-19 LAB — GLUCOSE, CAPILLARY
Glucose-Capillary: 120 mg/dL — ABNORMAL HIGH (ref 70–99)
Glucose-Capillary: 124 mg/dL — ABNORMAL HIGH (ref 70–99)
Glucose-Capillary: 130 mg/dL — ABNORMAL HIGH (ref 70–99)
Glucose-Capillary: 135 mg/dL — ABNORMAL HIGH (ref 70–99)
Glucose-Capillary: 137 mg/dL — ABNORMAL HIGH (ref 70–99)
Glucose-Capillary: 137 mg/dL — ABNORMAL HIGH (ref 70–99)
Glucose-Capillary: 138 mg/dL — ABNORMAL HIGH (ref 70–99)
Glucose-Capillary: 139 mg/dL — ABNORMAL HIGH (ref 70–99)
Glucose-Capillary: 140 mg/dL — ABNORMAL HIGH (ref 70–99)
Glucose-Capillary: 141 mg/dL — ABNORMAL HIGH (ref 70–99)
Glucose-Capillary: 141 mg/dL — ABNORMAL HIGH (ref 70–99)
Glucose-Capillary: 142 mg/dL — ABNORMAL HIGH (ref 70–99)
Glucose-Capillary: 145 mg/dL — ABNORMAL HIGH (ref 70–99)
Glucose-Capillary: 148 mg/dL — ABNORMAL HIGH (ref 70–99)
Glucose-Capillary: 149 mg/dL — ABNORMAL HIGH (ref 70–99)
Glucose-Capillary: 151 mg/dL — ABNORMAL HIGH (ref 70–99)
Glucose-Capillary: 151 mg/dL — ABNORMAL HIGH (ref 70–99)
Glucose-Capillary: 152 mg/dL — ABNORMAL HIGH (ref 70–99)
Glucose-Capillary: 152 mg/dL — ABNORMAL HIGH (ref 70–99)

## 2020-11-19 LAB — CBC
HCT: 25.3 % — ABNORMAL LOW (ref 36.0–46.0)
Hemoglobin: 7.9 g/dL — ABNORMAL LOW (ref 12.0–15.0)
MCH: 29.2 pg (ref 26.0–34.0)
MCHC: 31.2 g/dL (ref 30.0–36.0)
MCV: 93.4 fL (ref 80.0–100.0)
Platelets: 93 10*3/uL — ABNORMAL LOW (ref 150–400)
RBC: 2.71 MIL/uL — ABNORMAL LOW (ref 3.87–5.11)
RDW: 18.5 % — ABNORMAL HIGH (ref 11.5–15.5)
WBC: 37.4 10*3/uL — ABNORMAL HIGH (ref 4.0–10.5)
nRBC: 0.5 % — ABNORMAL HIGH (ref 0.0–0.2)

## 2020-11-19 LAB — BPAM RBC
Blood Product Expiration Date: 202208142359
Blood Product Expiration Date: 202208192359
ISSUE DATE / TIME: 202207201758
ISSUE DATE / TIME: 202207221706
Unit Type and Rh: 600
Unit Type and Rh: 600

## 2020-11-19 LAB — BASIC METABOLIC PANEL
Anion gap: 12 (ref 5–15)
BUN: 121 mg/dL — ABNORMAL HIGH (ref 8–23)
CO2: 26 mmol/L (ref 22–32)
Calcium: 8.2 mg/dL — ABNORMAL LOW (ref 8.9–10.3)
Chloride: 98 mmol/L (ref 98–111)
Creatinine, Ser: 3.41 mg/dL — ABNORMAL HIGH (ref 0.44–1.00)
GFR, Estimated: 13 mL/min — ABNORMAL LOW (ref >60)
Glucose, Bld: 143 mg/dL — ABNORMAL HIGH (ref 70–99)
Potassium: 4.8 mmol/L (ref 3.5–5.1)
Sodium: 136 mmol/L (ref 135–145)

## 2020-11-19 LAB — TYPE AND SCREEN
ABO/RH(D): A NEG
Antibody Screen: NEGATIVE
Unit division: 0
Unit division: 0

## 2020-11-19 LAB — CULTURE, BLOOD (ROUTINE X 2)
Culture: NO GROWTH
Culture: NO GROWTH

## 2020-11-19 MED ORDER — TRAVASOL 10 % IV SOLN
INTRAVENOUS | Status: DC
  Filled 2020-11-19 (×2): qty 1080

## 2020-11-19 NOTE — Plan of Care (Signed)
  Problem: Clinical Measurements: Goal: Ability to maintain clinical measurements within normal limits will improve Outcome: Progressing Goal: Will remain free from infection Outcome: Progressing Goal: Diagnostic test results will improve Outcome: Progressing Goal: Respiratory complications will improve Outcome: Progressing Goal: Cardiovascular complication will be avoided Outcome: Progressing   Problem: Health Behavior/Discharge Planning: Goal: Ability to manage health-related needs will improve Outcome: Progressing   Problem: Elimination: Goal: Will not experience complications related to bowel motility Outcome: Progressing Goal: Will not experience complications related to urinary retention Outcome: Progressing

## 2020-11-19 NOTE — Progress Notes (Signed)
NAME:  [name], MRN:  [id number], DOB:  [birth year], LOS: 25 ADMISSION DATE:  10/12/2020, CONSULTATION DATE:  7/5 REFERRING MD:  Barry Dienes, CHIEF COMPLAINT:  Cardiac arrest   History of Present Illness:  [name] is a 75 y.o. 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 She had procedure done 6/28.  Pathology was negative for malignancy but showed LGD with negative nodes.   Early AM 7/5, she had wheezing so received a breathing treatment.  Shortly thereafter, she was found with emesis all over bed and floor.  She had gurgling sounds and altered LOC.  She ended up having loss of pulses with initial rhythm asystole.  She received '1mg'$  epinephrine before PEA for which she received a 2nd dose of epinephrine.  She then had ROSC after 6 minutes.  She was intubated during the code.   She was transferred to [facility] ICU where PCCM was asked to assist with vent management.  Pertinent  Medical History  OSA DM2 Hypertension CAD Allergic rhinitis  Significant Hospital Events: Including procedures, antibiotic start and stop dates in addition to other pertinent events   6/28 admit, robotic distal gastrectomy,  7/5 Six minute code (asystole then PEA), severe hypoxemic respiratory failure and shock 7/5 levaquin x1 7/5 ceftriaxone > 7/6 7/5 doxycycline > 7/6 7/6 cefepime >  7/6 EEG > generalized slowing suggestive of severe diffuse encephalopathy non-specific in etiology  7/6 TTE > LVEF 65-70%, grade II diastolic dysfunction, RSVP elevated 58.1 mmHg, trivial MR 7/12 extubated 7/13 To PCU, [facility] 7/18 PCCM called back for respiratory distress, moved to ICU for increased oxygen requirements. Temporarily was on bipap. 7/20 cardiac arrest following massive aspiration. PEA. Profoundly hypoxemic afterwards requiring prone ventilation.   Interim History /  Subjective:  No events. Some pressure ulcers on face. BP readings below are not accurate PEEP 10, FiO2 70  Objective   Blood pressure (!) 442/49, pulse 80, temperature 97.9 F (36.6 C), temperature source Oral, resp. rate (!) 34, height '4\' 11"'$  (1.499 m), weight 109.2 kg, SpO2 94 %.    Vent Mode: PRVC FiO2 (%):  [60 %-80 %] 70 % Set Rate:  [34 bmp] 34 bmp Vt Set:  [300 mL] 300 mL PEEP:  [10 cmH20] 10 cmH20 Plateau Pressure:  [27 cmH20-28 cmH20] 27 cmH20   Intake/Output Summary (Last 24 hours) at 11/19/2020 0823 Last data filed at 11/19/2020 0600 Gross per 24 hour  Intake 3650.58 ml  Output 105 ml  Net 3545.58 ml    Filed Weights   11/16/20 0300 11/18/20 0500 11/19/20 0452  Weight: 97.1 kg 103.6 kg 109.2 kg    Examination: Intubated, heavily sedated Pupils small, reactive, not tracking Facial pressure ulcers developing from proning Lungs with rhonci at bases GCS3 on current sedation Abdomen soft with hypoactive BS  105cc urine output BUN/Cr continue to rise WBC increasing Plts increasing Hgb responded to 1 unit pRBC  Resolved Hospital Problem list   AKI  Hyperkalemia  NGMA secondary to Hyperchloremic Acidosis.   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- had been improving slowly Post partial gastrectomy- complicated by recurrent aspiration and gastroparesis Hypothyroidism DM2 with hyperglycemia Lactic  acidemia post arrest- improved Muscular deconditioning- from prolonged ICU stay  - Continue lung protective TV, hold on further proning - See if we can lighten sedation a bit - 5 days rocephin - TPN, insulin as ordered, appreciate pharmD help - Continue supportive care, if kidneys fail to recover, family would like for  her to pass in peace, DNR  Best Practice:  DVT: off  Feeding: TPN GI: protonix, NGT  Erskine Emery MD PCCM 35 minutes critical care time not including any separately billable procedures

## 2020-11-19 NOTE — Progress Notes (Signed)
PHARMACY - TOTAL PARENTERAL NUTRITION CONSULT NOTE  Indication: Partial gastrectomy and TF intolerance   Patient Measurements: Height: 4' 11"  (149.9 cm) Weight: 109.2 kg (240 lb 11.9 oz) IBW/kg (Calculated) : 43.2 TPN AdjBW (KG): 56.1 Body mass index is 48.62 kg/m. Usual Weight: 95 kg  Assessment:  75 yo W admitted for 6/28 elective distal gastrectomy for malignant polyp. Patient had a 6 minute code on 7/5 with severe respiratory failure and shock. Patient had an ileus and presumed functional gastric outlet obstruction post-op.  Patient tolerated TF 7/5-7/7 at 20 ml/hr, 7/7- 7/8 at 30 ml/hr, 7/9-7/10 at 40 ml/hr (goal 55 ml/hr). TF were held due to access issues then restarted 7/12 at 20-30 ml/hr. TF were stopped 7/13 for large volume bilious emesis that does not look like TF. Weight unchanged from usual but had anasarca earlier this admit, now resolved. Concern of efferent limb obstruction - likely plan for revision once stabilized. Patient with PEA arrest on 7/20 during NGT placement and is continued on epinephrine, norepinephrine and vasopressin.   Glucose / Insulin: hx DM on glipizide PTA -CBGs trended up with TPN >> improved with insulin added to TPN (~60 units) - D5W at 50/hr 7/18 > 7/20 for hyperNa with Novolog 4units Q4H ordered but none given - CBGs significant elevated with addition of stress steroids on 7/20 - Insulin gtt started 7/21.  TPN contains 70 units insulin while on insulin gtt - CBGs approaching goal. CBGs 143-170. Electrolytes: Na down to 136 (no Na in TPN since 7/16), K 4.8, Cl 98, CoCa 9.4. Other electrolytes wnl.  Renal:  AKI - SCr up 3.41 (BL 1-1.1), BUN up to 121 - no HD per palliative discussion with family  GI/Hepatic: AST/ALT down to 67/30, alk phos WNL, tbili up to 3.2 (sclera jaundice on 7/15, no jaundice 7/19 per RN, slight jaundice in sclera again on 7/21). Albumin 2.5. Prealbumin down to 7.3.  TG WNL. Intake / Output; MIVF:  No UOP recorded. NG removed.  LBM 7/20.  Net +20.5L Pertinent GI Imaging: 7/11 CT - wall thickening at Cotter anastomosis, no evidence of outlet obstruction 7/21 Abd XR - slight improved aeration GI Surgeries / Procedures:  6/28 elective distal gastrectomy 7/15 EGD - normal esophagus, jejunal stenosis, antrectomy found  Central access: triple IJ placed 11/01/20, PICC placed 11/10/20 TPN start date: 11/10/20  Nutritional Goals (per RD rec on 7/21): kCal: 1800-2000, Protein: 100-120, Fluid: >/= 1.8 L/d  Current Nutrition:  TPN Off TF 7/13  Plan:  Continue TPN at goal rate 75 ml/hr to provide 108g AA, 252g CHO and 54g ILE for a total of 1829 kCal, meeting 100% of patient needs Electrolytes in TPN: Increase Na to 35 mEq/L, decrease 35 mEq/L. Continue Ca 7mq/L (added 7/21), Mag 514m/L (incr 7/21), Phos 1012m/L. Adjust Cl:Ac to 1:2 Continue 70 units regular insulin in TPN Off steroid per CCM - wean off insulin infusion as able.  No plan for long-acting insulin with insulin in TPN. Add standard MVI and 1/2 trace elements to TPN Standard TPN labs on Mon/Thurs, repeat electrolytes tomorrow  F/U GoC discussion, volume status, tbili/jaundice, CBGs  GraCristela FeltharmD, BCPS Clinical Pharmacist 11/19/2020, 7:03 AM

## 2020-11-19 NOTE — Progress Notes (Signed)
Patient ID: [name], female   DOB: 03/30/1946, 75 y.o.   MRN: [id number] [facility] Progress Note:     Subjective: No acute events overnight   Objective: Vital signs in last 24 hours: Temp:  [97.9 F (36.6 C)-99.1 F (37.3 C)] 97.9 F (36.6 C) (07/22 2325) Pulse Rate:  [80-95] 80 (07/23 0723) Resp:  [18-34] 34 (07/23 0723) BP: (143-442)/(49-54) 442/49 (07/22 1730) SpO2:  [83 %-100 %] 94 % (07/23 0723) Arterial Line BP: (118-191)/(37-75) 163/60 (07/23 0645) FiO2 (%):  [60 %-80 %] 70 % (07/23 0723) Weight:  [109.2 kg] 109.2 kg (07/23 0452)  Intake/Output from previous day: 07/22 0701 - 07/23 0700 In: 3650.6 [I.V.:2779.6; Blood:265; IV Piggyback:606] Out: 105 [Urine:105] Intake/Output this shift: No intake/output data recorded.   Physical Exam:  Sedated, intubated.   Resp:  comfortable on vent, synchronous CV regular rate and rhythm Abd soft, non tender, non distended.  Incisions healing well without evidence of infection.     Lab Results:  Results for orders placed or performed during the hospital encounter of 09/29/2020 (from the past 48 hour(s))  Glucose, capillary     Status: Abnormal   Collection Time: 11/17/20  8:23 AM  Result Value Ref Range   Glucose-Capillary 295 (H) 70 - 99 mg/dL    Comment: Glucose reference range applies only to samples taken after fasting for at least 8 hours.  I-STAT 7, (LYTES, BLD GAS, ICA, H+H)     Status: Abnormal   Collection Time: 11/17/20 11:24 AM  Result Value Ref Range   pH, Arterial 7.386 7.350 - 7.450   pCO2 arterial 51.7 (H) 32.0 - 48.0 mmHg   pO2, Arterial 75 (L) 83.0 - 108.0 mmHg   Bicarbonate 31.0 (H) 20.0 - 28.0 mmol/L   TCO2 33 (H) 22 - 32 mmol/L   O2 Saturation 94.0 %   Acid-Base Excess 5.0 (H) 0.0 - 2.0 mmol/L   Sodium 143 135 - 145 mmol/L   Potassium 3.8 3.5 - 5.1 mmol/L   Calcium, Ion 1.03 (L) 1.15 - 1.40 mmol/L   HCT 24.0 (L) 36.0 - 46.0 %   Hemoglobin 8.2 (L) 12.0 - 15.0 g/dL   Sample  type ARTERIAL   Lactic acid, plasma     Status: Abnormal   Collection Time: 11/17/20 11:30 AM  Result Value Ref Range   Lactic Acid, Venous 5.5 (HH) 0.5 - 1.9 mmol/L    Comment: CRITICAL VALUE NOTED.  VALUE IS CONSISTENT WITH PREVIOUSLY REPORTED AND CALLED VALUE. Performed at [facility], [place] [address] [place], [place] [address]   Glucose, capillary     Status: Abnormal   Collection Time: 11/17/20 11:41 AM  Result Value Ref Range   Glucose-Capillary 342 (H) 70 - 99 mg/dL    Comment: Glucose reference range applies only to samples taken after fasting for at least 8 hours.  Fungus culture, blood     Status: None (Preliminary result)   Collection Time: 11/17/20 12:13 PM   Specimen: BLOOD  Result Value Ref Range   Specimen Description BLOOD RIGHT ANTECUBITAL    Special Requests      BOTTLES DRAWN AEROBIC AND ANAEROBIC Blood Culture adequate volume   Culture      NO GROWTH < 24 HOURS Performed at [facility], [place] [address], [place], [place] [address]    Report Status PENDING   I-STAT 7, (LYTES, BLD GAS, ICA, H+H)     Status: Abnormal   Collection Time: 11/17/20  1:39  PM  Result Value Ref Range   pH, Arterial 7.307 (L) 7.350 - 7.450   pCO2 arterial 64.2 (H) 32.0 - 48.0 mmHg   pO2, Arterial 94 83.0 - 108.0 mmHg   Bicarbonate 32.0 (H) 20.0 - 28.0 mmol/L   TCO2 34 (H) 22 - 32 mmol/L   O2 Saturation 96.0 %   Acid-Base Excess 5.0 (H) 0.0 - 2.0 mmol/L   Sodium 143 135 - 145 mmol/L   Potassium 4.1 3.5 - 5.1 mmol/L   Calcium, Ion 1.11 (L) 1.15 - 1.40 mmol/L   HCT 23.0 (L) 36.0 - 46.0 %   Hemoglobin 7.8 (L) 12.0 - 15.0 g/dL   Patient temperature 98.9 F    Collection site [employer] features editor by Operator    Sample type ARTERIAL   Glucose, capillary     Status: Abnormal   Collection Time: 11/17/20  4:33 PM  Result Value Ref Range   Glucose-Capillary 338 (H) 70 - 99 mg/dL    Comment: Glucose reference range applies only to samples taken after fasting for at least 8 hours.   Glucose, capillary     Status: Abnormal   Collection Time: 11/17/20  5:52 PM  Result Value Ref Range   Glucose-Capillary 316 (H) 70 - 99 mg/dL    Comment: Glucose reference range applies only to samples taken after fasting for at least 8 hours.  Glucose, capillary     Status: Abnormal   Collection Time: 11/17/20  7:05 PM  Result Value Ref Range   Glucose-Capillary 272 (H) 70 - 99 mg/dL    Comment: Glucose reference range applies only to samples taken after fasting for at least 8 hours.  Glucose, capillary     Status: Abnormal   Collection Time: 11/17/20  8:16 PM  Result Value Ref Range   Glucose-Capillary 230 (H) 70 - 99 mg/dL    Comment: Glucose reference range applies only to samples taken after fasting for at least 8 hours.  Glucose, capillary     Status: Abnormal   Collection Time: 11/17/20  9:47 PM  Result Value Ref Range   Glucose-Capillary 204 (H) 70 - 99 mg/dL    Comment: Glucose reference range applies only to samples taken after fasting for at least 8 hours.  Glucose, capillary     Status: Abnormal   Collection Time: 11/17/20 11:16 PM  Result Value Ref Range   Glucose-Capillary 194 (H) 70 - 99 mg/dL    Comment: Glucose reference range applies only to samples taken after fasting for at least 8 hours.  Glucose, capillary     Status: Abnormal   Collection Time: 11/18/20  1:21 AM  Result Value Ref Range   Glucose-Capillary 181 (H) 70 - 99 mg/dL    Comment: Glucose reference range applies only to samples taken after fasting for at least 8 hours.  Glucose, capillary     Status: Abnormal   Collection Time: 11/18/20  3:04 AM  Result Value Ref Range   Glucose-Capillary 191 (H) 70 - 99 mg/dL    Comment: Glucose reference range applies only to samples taken after fasting for at least 8 hours.  CBC     Status: Abnormal   Collection Time: 11/18/20  3:15 AM  Result Value Ref Range   WBC 33.3 (H) 4.0 - 10.5 K/uL   RBC 2.48 (L) 3.87 - 5.11 MIL/uL   Hemoglobin 7.0 (L) 12.0 - 15.0  g/dL   HCT 23.5 (L) 36.0 - 46.0 %   MCV 94.8  80.0 - 100.0 fL   MCH 28.2 26.0 - 34.0 pg   MCHC 29.8 (L) 30.0 - 36.0 g/dL   RDW 19.2 (H) 11.5 - 15.5 %   Platelets 107 (L) 150 - 400 K/uL    Comment: Immature Platelet Fraction may be clinically indicated, consider ordering this additional test [id number] REPEATED TO VERIFY PLATELET COUNT CONFIRMED BY SMEAR    nRBC 0.6 (H) 0.0 - 0.2 %    Comment: Performed at [facility] [address], [place], [place] [address]  Comprehensive metabolic panel     Status: Abnormal   Collection Time: 11/18/20  3:15 AM  Result Value Ref Range   Sodium 139 135 - 145 mmol/L   Potassium 3.9 3.5 - 5.1 mmol/L   Chloride 101 98 - 111 mmol/L   CO2 29 22 - 32 mmol/L   Glucose, Bld 188 (H) 70 - 99 mg/dL    Comment: Glucose reference range applies only to samples taken after fasting for at least 8 hours.   BUN 97 (H) 8 - 23 mg/dL   Creatinine, Ser 2.85 (H) 0.44 - 1.00 mg/dL   Calcium 7.9 (L) 8.9 - 10.3 mg/dL   Total Protein 5.2 (L) 6.5 - 8.1 g/dL   Albumin 2.5 (L) 3.5 - 5.0 g/dL   AST 67 (H) 15 - 41 U/L   ALT 30 0 - 44 U/L   Alkaline Phosphatase 67 38 - 126 U/L   Total Bilirubin 3.2 (H) 0.3 - 1.2 mg/dL   GFR, Estimated 17 (L) >60 mL/min    Comment: (NOTE) Calculated using the CKD-EPI Creatinine Equation (2021)    Anion gap 9 5 - 15    Comment: Performed at [facility], [place] [address], [place], [place] [address]  Magnesium     Status: None   Collection Time: 11/18/20  3:15 AM  Result Value Ref Range   Magnesium 2.3 1.7 - 2.4 mg/dL    Comment: Performed at [facility] [address] [place], [place] [address]  Phosphorus     Status: None   Collection Time: 11/18/20  3:15 AM  Result Value Ref Range   Phosphorus 3.7 2.5 - 4.6 mg/dL    Comment: Performed at [facility] [address], [place], [place] [address]  Lactic acid, plasma     Status: Abnormal   Collection Time: 11/18/20  3:16 AM  Result Value Ref Range   Lactic  Acid, Venous 2.2 (HH) 0.5 - 1.9 mmol/L    Comment: CRITICAL VALUE NOTED.  VALUE IS CONSISTENT WITH PREVIOUSLY REPORTED AND CALLED VALUE. Performed at [facility], [place] [address], [place], [place] [address]   Glucose, capillary     Status: Abnormal   Collection Time: 11/18/20  4:23 AM  Result Value Ref Range   Glucose-Capillary 171 (H) 70 - 99 mg/dL    Comment: Glucose reference range applies only to samples taken after fasting for at least 8 hours.  Glucose, capillary     Status: Abnormal   Collection Time: 11/18/20  5:23 AM  Result Value Ref Range   Glucose-Capillary 177 (H) 70 - 99 mg/dL    Comment: Glucose reference range applies only to samples taken after fasting for at least 8 hours.  I-STAT 7, (LYTES, BLD GAS, ICA, H+H)     Status: Abnormal   Collection Time: 11/18/20  5:26 AM  Result Value Ref Range   pH, Arterial 7.332 (L) 7.350 - 7.450  pCO2 arterial 62.7 (H) 32.0 - 48.0 mmHg   pO2, Arterial 95 83.0 - 108.0 mmHg   Bicarbonate 33.3 (H) 20.0 - 28.0 mmol/L   TCO2 35 (H) 22 - 32 mmol/L   O2 Saturation 97.0 %   Acid-Base Excess 6.0 (H) 0.0 - 2.0 mmol/L   Sodium 139 135 - 145 mmol/L   Potassium 5.8 (H) 3.5 - 5.1 mmol/L   Calcium, Ion 1.08 (L) 1.15 - 1.40 mmol/L   HCT 24.0 (L) 36.0 - 46.0 %   Hemoglobin 8.2 (L) 12.0 - 15.0 g/dL   Patient temperature 98.1 F    Sample type ARTERIAL   Glucose, capillary     Status: Abnormal   Collection Time: 11/18/20  6:52 AM  Result Value Ref Range   Glucose-Capillary 191 (H) 70 - 99 mg/dL    Comment: Glucose reference range applies only to samples taken after fasting for at least 8 hours.  Glucose, capillary     Status: Abnormal   Collection Time: 11/18/20  8:01 AM  Result Value Ref Range   Glucose-Capillary 172 (H) 70 - 99 mg/dL    Comment: Glucose reference range applies only to samples taken after fasting for at least 8 hours.  Glucose, capillary     Status: Abnormal   Collection Time: 11/18/20  9:39 AM  Result Value Ref Range    Glucose-Capillary 192 (H) 70 - 99 mg/dL    Comment: Glucose reference range applies only to samples taken after fasting for at least 8 hours.  Glucose, capillary     Status: Abnormal   Collection Time: 11/18/20 11:52 AM  Result Value Ref Range   Glucose-Capillary 163 (H) 70 - 99 mg/dL    Comment: Glucose reference range applies only to samples taken after fasting for at least 8 hours.  I-STAT 7, (LYTES, BLD GAS, ICA, H+H)     Status: Abnormal   Collection Time: 11/18/20 12:36 PM  Result Value Ref Range   pH, Arterial 7.317 (L) 7.350 - 7.450   pCO2 arterial 59.3 (H) 32.0 - 48.0 mmHg   pO2, Arterial 82 (L) 83.0 - 108.0 mmHg   Bicarbonate 30.2 (H) 20.0 - 28.0 mmol/L   TCO2 32 22 - 32 mmol/L   O2 Saturation 94.0 %   Acid-Base Excess 4.0 (H) 0.0 - 2.0 mmol/L   Sodium 139 135 - 145 mmol/L   Potassium 4.2 3.5 - 5.1 mmol/L   Calcium, Ion 1.15 1.15 - 1.40 mmol/L   HCT 21.0 (L) 36.0 - 46.0 %   Hemoglobin 7.1 (L) 12.0 - 15.0 g/dL   Patient temperature 37.3 C    Sample type ARTERIAL   Glucose, capillary     Status: Abnormal   Collection Time: 11/18/20  1:34 PM  Result Value Ref Range   Glucose-Capillary 162 (H) 70 - 99 mg/dL    Comment: Glucose reference range applies only to samples taken after fasting for at least 8 hours.  Basic metabolic panel     Status: Abnormal   Collection Time: 11/18/20  2:00 PM  Result Value Ref Range   Sodium 138 135 - 145 mmol/L   Potassium 4.3 3.5 - 5.1 mmol/L   Chloride 100 98 - 111 mmol/L   CO2 28 22 - 32 mmol/L   Glucose, Bld 170 (H) 70 - 99 mg/dL    Comment: Glucose reference range applies only to samples taken after fasting for at least 8 hours.   BUN 117 (H) 8 - 23 mg/dL   Creatinine, Ser  3.14 (H) 0.44 - 1.00 mg/dL   Calcium 7.8 (L) 8.9 - 10.3 mg/dL   GFR, Estimated 15 (L) >60 mL/min    Comment: (NOTE) Calculated using the CKD-EPI Creatinine Equation (2021)    Anion gap 10 5 - 15    Comment: Performed at [facility] [address] [place], [place] [address]  CBC     Status: Abnormal   Collection Time: 11/18/20  2:00 PM  Result Value Ref Range   WBC 32.4 (H) 4.0 - 10.5 K/uL   RBC 2.33 (L) 3.87 - 5.11 MIL/uL   Hemoglobin 6.7 (LL) 12.0 - 15.0 g/dL    Comment: REPEATED TO VERIFY THIS CRITICAL RESULT HAS VERIFIED AND BEEN CALLED TO K DALISAY RN BY KIRSTENE FORSYTH ON 07 22 2022 AT [id number], AND HAS BEEN READ BACK.     HCT 22.1 (L) 36.0 - 46.0 %   MCV 94.8 80.0 - 100.0 fL   MCH 28.8 26.0 - 34.0 pg   MCHC 30.3 30.0 - 36.0 g/dL   RDW 19.0 (H) 11.5 - 15.5 %   Platelets 89 (L) 150 - 400 K/uL    Comment: Immature Platelet Fraction may be clinically indicated, consider ordering this additional test [id number] CONSISTENT WITH PREVIOUS RESULT REPEATED TO VERIFY    nRBC 0.4 (H) 0.0 - 0.2 %    Comment: Performed at [facility], [place] [address] [place], [place] [address]  Glucose, capillary     Status: Abnormal   Collection Time: 11/18/20  3:03 PM  Result Value Ref Range   Glucose-Capillary 164 (H) 70 - 99 mg/dL    Comment: Glucose reference range applies only to samples taken after fasting for at least 8 hours.  Glucose, capillary     Status: Abnormal   Collection Time: 11/18/20  3:55 PM  Result Value Ref Range   Glucose-Capillary 161 (H) 70 - 99 mg/dL    Comment: Glucose reference range applies only to samples taken after fasting for at least 8 hours.  Prepare RBC (crossmatch)     Status: None   Collection Time: 11/18/20  4:44 PM  Result Value Ref Range   Order Confirmation      ORDER PROCESSED BY BLOOD BANK Performed at [facility], [place] [address], [place], [place] [address]   Glucose, capillary     Status: Abnormal   Collection Time: 11/18/20  5:19 PM  Result Value Ref Range   Glucose-Capillary 166 (H) 70 - 99 mg/dL    Comment: Glucose reference range applies only to samples taken after fasting for at least 8 hours.  Glucose, capillary     Status: Abnormal   Collection Time: 11/18/20  6:29 PM  Result  Value Ref Range   Glucose-Capillary 163 (H) 70 - 99 mg/dL    Comment: Glucose reference range applies only to samples taken after fasting for at least 8 hours.  Glucose, capillary     Status: Abnormal   Collection Time: 11/18/20  8:19 PM  Result Value Ref Range   Glucose-Capillary 168 (H) 70 - 99 mg/dL    Comment: Glucose reference range applies only to samples taken after fasting for at least 8 hours.  Glucose, capillary     Status: Abnormal   Collection Time: 11/18/20 10:22 PM  Result Value Ref Range   Glucose-Capillary 158 (H) 70 - 99 mg/dL    Comment: Glucose reference range applies only to samples taken after fasting for at least 8 hours.  Glucose,  capillary     Status: Abnormal   Collection Time: 11/19/20 12:25 AM  Result Value Ref Range   Glucose-Capillary 151 (H) 70 - 99 mg/dL    Comment: Glucose reference range applies only to samples taken after fasting for at least 8 hours.  CBC     Status: Abnormal   Collection Time: 11/19/20  2:15 AM  Result Value Ref Range   WBC 37.4 (H) 4.0 - 10.5 K/uL   RBC 2.71 (L) 3.87 - 5.11 MIL/uL   Hemoglobin 7.9 (L) 12.0 - 15.0 g/dL   HCT 25.3 (L) 36.0 - 46.0 %   MCV 93.4 80.0 - 100.0 fL   MCH 29.2 26.0 - 34.0 pg   MCHC 31.2 30.0 - 36.0 g/dL   RDW 18.5 (H) 11.5 - 15.5 %   Platelets 93 (L) 150 - 400 K/uL    Comment: Immature Platelet Fraction may be clinically indicated, consider ordering this additional test [id number] CONSISTENT WITH PREVIOUS RESULT REPEATED TO VERIFY    nRBC 0.5 (H) 0.0 - 0.2 %    Comment: Performed at [facility], [place] [address] [place], [place] [address]  Glucose, capillary     Status: Abnormal   Collection Time: 11/19/20  2:21 AM  Result Value Ref Range   Glucose-Capillary 152 (H) 70 - 99 mg/dL    Comment: Glucose reference range applies only to samples taken after fasting for at least 8 hours.  Basic metabolic panel     Status: Abnormal   Collection Time: 11/19/20  2:24 AM  Result Value Ref Range    Sodium 136 135 - 145 mmol/L   Potassium 4.8 3.5 - 5.1 mmol/L   Chloride 98 98 - 111 mmol/L   CO2 26 22 - 32 mmol/L   Glucose, Bld 143 (H) 70 - 99 mg/dL    Comment: Glucose reference range applies only to samples taken after fasting for at least 8 hours.   BUN 121 (H) 8 - 23 mg/dL   Creatinine, Ser 3.41 (H) 0.44 - 1.00 mg/dL   Calcium 8.2 (L) 8.9 - 10.3 mg/dL   GFR, Estimated 13 (L) >60 mL/min    Comment: (NOTE) Calculated using the CKD-EPI Creatinine Equation (2021)    Anion gap 12 5 - 15    Comment: Performed at [facility] [address], [place], [place] [address]  Glucose, capillary     Status: Abnormal   Collection Time: 11/19/20  4:31 AM  Result Value Ref Range   Glucose-Capillary 130 (H) 70 - 99 mg/dL    Comment: Glucose reference range applies only to samples taken after fasting for at least 8 hours.  Glucose, capillary     Status: Abnormal   Collection Time: 11/19/20  5:44 AM  Result Value Ref Range   Glucose-Capillary 142 (H) 70 - 99 mg/dL    Comment: Glucose reference range applies only to samples taken after fasting for at least 8 hours.  Glucose, capillary     Status: Abnormal   Collection Time: 11/19/20  6:44 AM  Result Value Ref Range   Glucose-Capillary 152 (H) 70 - 99 mg/dL    Comment: Glucose reference range applies only to samples taken after fasting for at least 8 hours.    Radiology/Results: DG Abd 1 View  Result Date: 11/18/2020 CLINICAL DATA:  ARDS.  Ileus. EXAM: ABDOMEN - 1 VIEW COMPARISON:  One-view abdomen 10/11/2020 one-view chest x-ray 11/18/2020 FINDINGS: NG tube demonstrated on the chest x-ray is not imaged on  this film. Relatively gasless abdomen noted. No bowel distension present. No evidence for free air. IMPRESSION: Relatively gasless abdomen without evidence for obstruction or free air. Electronically Signed   By: San Morelle M.D.   On: 11/18/2020 11:30   DG Chest Port 1 View  Result Date: 11/18/2020 CLINICAL DATA:  ARDS,  ileus EXAM: PORTABLE CHEST 1 VIEW COMPARISON:  Portable exam 1102 hours compared to 1023 hours FINDINGS: Tip of endotracheal tube projects 1.6 cm above carina. Nasogastric tube extends into stomach. LEFT jugular line with tip projecting over SVC. Stable heart size and mediastinal contours. Atherosclerotic calcification aorta. BILATERAL pulmonary infiltrates unchanged. No pleural effusion or pneumothorax. Bones demineralized with prior cervicothoracic fusion. IMPRESSION: Persistent pulmonary infiltrates. Aortic Atherosclerosis (ICD10-I70.0). Electronically Signed   By: Lavonia Dana M.D.   On: 11/18/2020 14:05   Portable Chest xray  Result Date: 11/17/2020 CLINICAL DATA:  Check endotracheal tube, hypoxia EXAM: PORTABLE CHEST 1 VIEW COMPARISON:  11/16/2020 FINDINGS: Cardiac shadow is stable. Left-sided PICC line, left jugular central line and endotracheal tube are all noted in satisfactory position. Gastric catheter extends to the stomach. Patchy airspace disease is noted bilaterally but improved slightly in the interval from the prior exam. No bony abnormality is noted. IMPRESSION: Slight improved aeration when compare with the previous day. Electronically Signed   By: Inez Catalina M.D.   On: 11/17/2020 12:02    Anti-infectives: Anti-infectives (From admission, onward)    Start     Dose/Rate Route Frequency Ordered Stop   11/18/20 2100  cefTRIAXone (ROCEPHIN) 2 g in sodium chloride 0.9 % 100 mL IVPB        2 g 200 mL/hr over 30 Minutes Intravenous Every 24 hours 11/18/20 0830     11/18/20 1200  anidulafungin (ERAXIS) 100 mg in sodium chloride 0.9 % 100 mL IVPB  Status:  Discontinued        100 mg 78 mL/hr over 100 Minutes Intravenous Every 24 hours 11/17/20 1104 11/18/20 0827   11/17/20 2121  ceFEPIme (MAXIPIME) 2 g in sodium chloride 0.9 % 100 mL IVPB  Status:  Discontinued        2 g 200 mL/hr over 30 Minutes Intravenous Every 24 hours 11/16/20 2134 11/18/20 0829   11/17/20 1615  metroNIDAZOLE  (FLAGYL) IVPB 500 mg        500 mg 100 mL/hr over 60 Minutes Intravenous Every 8 hours 11/17/20 0812     11/17/20 1430  anidulafungin (ERAXIS) 200 mg in sodium chloride 0.9 % 200 mL IVPB        200 mg 78 mL/hr over 200 Minutes Intravenous  Once 11/17/20 1424 11/17/20 1850   11/17/20 1200  anidulafungin (ERAXIS) 200 mg in sodium chloride 0.9 % 200 mL IVPB  Status:  Discontinued        200 mg 78 mL/hr over 200 Minutes Intravenous Every 24 hours 11/17/20 1104 11/17/20 1424   11/16/20 1000  ceFEPIme (MAXIPIME) 2 g in sodium chloride 0.9 % 100 mL IVPB  Status:  Discontinued        2 g 200 mL/hr over 30 Minutes Intravenous Every 12 hours 11/16/20 0746 11/16/20 2134   11/15/20 2000  vancomycin (VANCOCIN) IVPB 1000 mg/200 mL premix  Status:  Discontinued        1,000 mg 200 mL/hr over 60 Minutes Intravenous Every 24 hours 11/15/20 0827 11/16/20 0759   11/15/20 1800  ceFEPIme (MAXIPIME) 2 g in sodium chloride 0.9 % 100 mL IVPB  Status:  Discontinued  2 g 200 mL/hr over 30 Minutes Intravenous Every 24 hours 11/15/20 0827 11/16/20 0746   11/14/20 1945  ceFEPIme (MAXIPIME) 2 g in sodium chloride 0.9 % 100 mL IVPB  Status:  Discontinued        2 g 200 mL/hr over 30 Minutes Intravenous Every 12 hours 11/14/20 1853 11/15/20 0827   11/14/20 1945  vancomycin (VANCOCIN) IVPB 1000 mg/200 mL premix  Status:  Discontinued        1,000 mg 200 mL/hr over 60 Minutes Intravenous Every 24 hours 11/14/20 1855 11/15/20 0827   11/14/20 1615  cefTRIAXone (ROCEPHIN) 2 g in sodium chloride 0.9 % 100 mL IVPB  Status:  Discontinued        2 g 200 mL/hr over 30 Minutes Intravenous Every 24 hours 11/14/20 1521 11/14/20 1802   11/14/20 1615  metroNIDAZOLE (FLAGYL) IVPB 500 mg  Status:  Discontinued        500 mg 100 mL/hr over 60 Minutes Intravenous Every 8 hours 11/14/20 1521 11/17/20 0812   11/04/20 1245  cefTRIAXone (ROCEPHIN) 2 g in sodium chloride 0.9 % 100 mL IVPB        2 g 200 mL/hr over 30 Minutes  Intravenous Every 24 hours 11/04/20 1148 11/06/20 1302   11/02/20 1000  cefTRIAXone (ROCEPHIN) 2 g in sodium chloride 0.9 % 100 mL IVPB  Status:  Discontinued        2 g 200 mL/hr over 30 Minutes Intravenous Every 24 hours 11/01/20 1257 11/02/20 0856   11/02/20 1000  doxycycline (VIBRAMYCIN) 100 mg in sodium chloride 0.9 % 250 mL IVPB  Status:  Discontinued        100 mg 125 mL/hr over 120 Minutes Intravenous Every 12 hours 11/01/20 1257 11/02/20 0856   11/02/20 1000  ceFEPIme (MAXIPIME) 2 g in sodium chloride 0.9 % 100 mL IVPB  Status:  Discontinued        2 g 200 mL/hr over 30 Minutes Intravenous Every 12 hours 11/02/20 0856 11/04/20 1148   11/01/20 0915  levofloxacin (LEVAQUIN) IVPB 750 mg  Status:  Discontinued        750 mg 100 mL/hr over 90 Minutes Intravenous Every 24 hours 11/01/20 0827 11/01/20 1257   11/01/20 0730  levofloxacin (LEVAQUIN) IVPB 500 mg  Status:  Discontinued        500 mg 100 mL/hr over 60 Minutes Intravenous Every 24 hours 11/01/20 0631 11/01/20 1257   10/07/2020 2100  ciprofloxacin (CIPRO) IVPB 400 mg        400 mg 200 mL/hr over 60 Minutes Intravenous Every 12 hours 10/03/2020 1258 10/24/2020 2148   10/09/2020 0600  ciprofloxacin (CIPRO) IVPB 400 mg        400 mg 200 mL/hr over 60 Minutes Intravenous On call to O.R. 10/18/2020 0547 10/06/2020 0847       Assessment/Plan: Problem List: Patient Active Problem List   Diagnosis Date Noted   ARDS (adult respiratory distress syndrome) ([place] [place])    Septic shock ([facility])    Pressure injury of skin 11/04/2020   Cardiac arrest ([facility])    Emesis    Respiratory failure ([facility])    SOB (shortness of breath)    Acute respiratory failure with hypoxemia ([facility])    Encounter for central line placement    S/P partial gastrectomy 10/12/2020   Type 2 diabetes mellitus without complication ([facility]) [id number]   Hyperlipidemia 03/01/2014   Focal seizures ([facility]) 10/21/2013   TIA (transient ischemic attack) 10/21/2013   CHEST PAIN 01/24/2010  BRADYCARDIA 12/23/2009   Diabetes ([place]) 08/04/2008   HYPOTHYROIDISM 03/06/2007   OBESITY 03/06/2007   OBSTRUCTIVE SLEEP APNEA 03/06/2007   HYPERTENSION, BENIGN 03/06/2007   CORONARY ARTERY DISEASE 03/06/2007   ALLERGIC RHINITIS 03/06/2007   DEGENERATIVE JOINT DISEASE, GENERALIZED 03/06/2007   BACK PAIN, CHRONIC 03/06/2007   Now s/p large aspiration and code x 2.  Remains on pressors (epi and vaso) with worsening acute renal failure/oliguria.    Efferent limb obstruction.  If we can stabilize her, this will need to be revised.    TNA for severe protein calorie malnutrition and intolerance of enteric feeding.  Pressors weaning as tolerated.   Antifungals added to broad spectrum antibiotics  Stress dose steroids.   Continue keppra.    Likely poor prognosis given second cardiac arrest. Was making slow improvements prior to this event.   HD unlikely to improve survival.  No HD to be offered.  Appreciate palliative care and CCM care.  Current plan is DNR, no dialysis, no escalation of care but will maintain current state over the weekend for family visitation with anticipated terminal extubation on Monday.   LOS: 25 days   [facility] [phone] for weekday/non holidays Check [email or web address] for coverage night/weekend/holidays  Do not use SecureChat as it is not reliable for timely patient care.     11/19/2020 7:35 AM

## 2020-11-20 ENCOUNTER — Inpatient Hospital Stay (HOSPITAL_COMMUNITY): Payer: Medicare PPO

## 2020-11-20 DIAGNOSIS — Z7189 Other specified counseling: Secondary | ICD-10-CM | POA: Diagnosis not present

## 2020-11-20 DIAGNOSIS — I469 Cardiac arrest, cause unspecified: Secondary | ICD-10-CM | POA: Diagnosis not present

## 2020-11-20 DIAGNOSIS — Z515 Encounter for palliative care: Secondary | ICD-10-CM | POA: Diagnosis not present

## 2020-11-20 LAB — GLUCOSE, CAPILLARY
Glucose-Capillary: 109 mg/dL — ABNORMAL HIGH (ref 70–99)
Glucose-Capillary: 110 mg/dL — ABNORMAL HIGH (ref 70–99)
Glucose-Capillary: 122 mg/dL — ABNORMAL HIGH (ref 70–99)
Glucose-Capillary: 125 mg/dL — ABNORMAL HIGH (ref 70–99)
Glucose-Capillary: 128 mg/dL — ABNORMAL HIGH (ref 70–99)
Glucose-Capillary: 132 mg/dL — ABNORMAL HIGH (ref 70–99)
Glucose-Capillary: 132 mg/dL — ABNORMAL HIGH (ref 70–99)
Glucose-Capillary: 134 mg/dL — ABNORMAL HIGH (ref 70–99)
Glucose-Capillary: 147 mg/dL — ABNORMAL HIGH (ref 70–99)
Glucose-Capillary: 157 mg/dL — ABNORMAL HIGH (ref 70–99)
Glucose-Capillary: 80 mg/dL (ref 70–99)
Glucose-Capillary: 86 mg/dL (ref 70–99)
Glucose-Capillary: 89 mg/dL (ref 70–99)

## 2020-11-20 LAB — CBC
HCT: 25 % — ABNORMAL LOW (ref 36.0–46.0)
Hemoglobin: 7.7 g/dL — ABNORMAL LOW (ref 12.0–15.0)
MCH: 28.9 pg (ref 26.0–34.0)
MCHC: 30.8 g/dL (ref 30.0–36.0)
MCV: 94 fL (ref 80.0–100.0)
Platelets: 105 10*3/uL — ABNORMAL LOW (ref 150–400)
RBC: 2.66 MIL/uL — ABNORMAL LOW (ref 3.87–5.11)
RDW: 18 % — ABNORMAL HIGH (ref 11.5–15.5)
WBC: 41.6 10*3/uL — ABNORMAL HIGH (ref 4.0–10.5)
nRBC: 0.5 % — ABNORMAL HIGH (ref 0.0–0.2)

## 2020-11-20 LAB — BASIC METABOLIC PANEL
Anion gap: 11 (ref 5–15)
BUN: 143 mg/dL — ABNORMAL HIGH (ref 8–23)
CO2: 23 mmol/L (ref 22–32)
Calcium: 8.1 mg/dL — ABNORMAL LOW (ref 8.9–10.3)
Chloride: 99 mmol/L (ref 98–111)
Creatinine, Ser: 3.85 mg/dL — ABNORMAL HIGH (ref 0.44–1.00)
GFR, Estimated: 12 mL/min — ABNORMAL LOW (ref >60)
Glucose, Bld: 141 mg/dL — ABNORMAL HIGH (ref 70–99)
Potassium: 5.4 mmol/L — ABNORMAL HIGH (ref 3.5–5.1)
Sodium: 133 mmol/L — ABNORMAL LOW (ref 135–145)

## 2020-11-20 LAB — MAGNESIUM: Magnesium: 2.4 mg/dL (ref 1.7–2.4)

## 2020-11-20 LAB — PHOSPHORUS: Phosphorus: 4.6 mg/dL (ref 2.5–4.6)

## 2020-11-20 MED ORDER — INSULIN ASPART 100 UNIT/ML IJ SOLN
0.0000 [IU] | INTRAMUSCULAR | Status: DC
Start: 2020-11-20 — End: 2020-11-21
  Administered 2020-11-20: 4 [IU] via SUBCUTANEOUS
  Administered 2020-11-21 (×3): 3 [IU] via SUBCUTANEOUS

## 2020-11-20 MED ORDER — ZINC CHLORIDE 1 MG/ML IV SOLN
INTRAVENOUS | Status: DC
  Filled 2020-11-20: qty 1080

## 2020-11-20 NOTE — Progress Notes (Addendum)
PHARMACY - TOTAL PARENTERAL NUTRITION CONSULT NOTE  Indication: Partial gastrectomy and TF intolerance   Patient Measurements: Height: 4' 11"  (149.9 cm) Weight: 109.2 kg (240 lb 11.9 oz) IBW/kg (Calculated) : 43.2 TPN AdjBW (KG): 56.1 Body mass index is 48.62 kg/m. Usual Weight: 95 kg  Assessment:  75 yo W admitted for 6/28 elective distal gastrectomy for malignant polyp. Patient had a 6 minute code on 7/5 with severe respiratory failure and shock. Patient had an ileus and presumed functional gastric outlet obstruction post-op.  Patient tolerated TF 7/5-7/7 at 20 ml/hr, 7/7- 7/8 at 30 ml/hr, 7/9-7/10 at 40 ml/hr (goal 55 ml/hr). TF were held due to access issues then restarted 7/12 at 20-30 ml/hr. TF were stopped 7/13 for large volume bilious emesis that does not look like TF. Weight unchanged from usual but had anasarca earlier this admit, now resolved. Concern of efferent limb obstruction - likely plan for revision once stabilized. Patient with PEA arrest on 7/20 during NGT placement and is continued on epinephrine, norepinephrine and vasopressin.   Glucose / Insulin: hx DM on glipizide PTA -CBGs trended up with TPN >> improved with insulin added to TPN (~60 units) - D5W at 50/hr 7/18 > 7/20 for hyperNa with Novolog 4units Q4H ordered but none given - CBGs significant elevated with addition of stress steroids on 7/20 - Insulin gtt started 7/21.  TPN contains 70 units insulin while on insulin gtt - CBGs at goal. Insulin gtt requirements decreasing. CBGs 132-141.  Electrolytes: Na 133 (Na resumed in TPN 7/23). K increased to 5.4 (TPN contains 63 mEq of K), CoCa 9.3, Phos 4.6, Mg 2.4. Other electrolytes wnl.  Renal:  AKI - SCr up 3.85 (BL 1-1.1), BUN up to 143 - no HD per palliative discussion with family  GI/Hepatic: AST/ALT down to 67/30, alk phos WNL, tbili up to 3.2 (sclera jaundice on 7/15, no jaundice 7/19 per RN, slight jaundice in sclera again on 7/21). Albumin 2.5. Prealbumin down to  7.3.  TG WNL. Intake / Output; MIVF:  260 ml UOP - slight increase. NG removed. LBM 7/20. Pos 23L.  Pertinent GI Imaging: 7/11 CT - wall thickening at [place] anastomosis, no evidence of outlet obstruction 7/21 Abd XR - slight improved aeration GI Surgeries / Procedures:  6/28 elective distal gastrectomy 7/15 EGD - normal esophagus, jejunal stenosis, antrectomy found  Central access: triple IJ placed 11/01/20, PICC placed 11/10/20 TPN start date: 11/10/20  Nutritional Goals (per RD rec on 7/21): kCal: 1800-2000, Protein: 100-120, Fluid: >/= 1.8 L/d  Current Nutrition:  TPN Off TF 7/13  Plan:  Discussed with Dr. Tamala Julian and will stop current TPN due to hyperkalemia (current TPN has 63 mEq of K) and discontinue insulin gtt  - Discussed with RN will turn off insulin gtt, reduce TPN to 35 ml/hr x 1 hr then stop - Will not add dextrose gtt while TPN is held due to previous hyperglycemia and current hyponatremia  - Resume resistant SSI q4h Resume TPN at goal rate of 75 ml/hr at 1800 to provide 108g AA, 252g CHO and 54g ILE for a total of 1829 kCal, meeting 100% of patient needs Electrolytes in TPN: Increase Na to 70 mEq/L, remove K, remove phos, remove Mg. Continue Ca 3 mEq/L (added 7/21). Continue Cl:Ac 1:2.  Reduce insulin to 40 units in TPN Add standard MVI  Remove selenium and chromium with renal impairment Add back zinc at half dose  Standard TPN labs on Mon/Thurs Follow up [facility] discussion   Shirlee Limerick  Aris Lot, PharmD, BCPS Clinical Pharmacist 11/20/2020, 6:56 AM

## 2020-11-20 NOTE — Progress Notes (Signed)
NAME:  [name], MRN:  [id number], DOB:  August 02, 1945, LOS: 2 ADMISSION DATE:  10/11/2020, CONSULTATION DATE:  7/5 REFERRING MD:  Barry Dienes, CHIEF COMPLAINT:  Cardiac arrest   History of Present Illness:  [name] is a 75 y.o. 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 She had procedure done 6/28.  Pathology was negative for malignancy but showed LGD with negative nodes.   Early AM 7/5, she had wheezing so received a breathing treatment.  Shortly thereafter, she was found with emesis all over bed and floor.  She had gurgling sounds and altered LOC.  She ended up having loss of pulses with initial rhythm asystole.  She received '1mg'$  epinephrine before PEA for which she received a 2nd dose of epinephrine.  She then had ROSC after 6 minutes.  She was intubated during the code.   She was transferred to [facility] ICU where PCCM was asked to assist with vent management.  Pertinent  Medical History  OSA DM2 Hypertension CAD Allergic rhinitis  Significant Hospital Events: Including procedures, antibiotic start and stop dates in addition to other pertinent events   6/28 admit, robotic distal gastrectomy,  7/5 Six minute code (asystole then PEA), severe hypoxemic respiratory failure and shock 7/5 levaquin x1 7/5 ceftriaxone > 7/6 7/5 doxycycline > 7/6 7/6 cefepime >  7/6 EEG > generalized slowing suggestive of severe diffuse encephalopathy non-specific in etiology  7/6 TTE > LVEF 65-70%, grade II diastolic dysfunction, RSVP elevated 58.1 mmHg, trivial MR 7/12 extubated 7/13 To PCU, [facility] 7/18 PCCM called back for respiratory distress, moved to ICU for increased oxygen requirements. Temporarily was on bipap. 7/20 cardiac arrest following massive aspiration. PEA. Profoundly hypoxemic afterwards requiring prone ventilation.   Interim History /  Subjective:  No events. Some pressure ulcers on face. BP readings below are not accurate PEEP 10, FiO2 70  Objective   Blood pressure (!) 143/51, pulse 86, temperature 98.2 F (36.8 C), temperature source Oral, resp. rate (!) 34, height '4\' 11"'$  (1.499 m), weight 109.2 kg, SpO2 98 %.    Vent Mode: PRVC FiO2 (%):  [60 %-70 %] 70 % Set Rate:  [34 bmp] 34 bmp Vt Set:  [300 mL] 300 mL PEEP:  [10 cmH20] 10 cmH20 Plateau Pressure:  [27 cmH20-28 cmH20] 27 cmH20   Intake/Output Summary (Last 24 hours) at 11/20/2020 0732 Last data filed at 11/20/2020 0600 Gross per 24 hour  Intake 3018.39 ml  Output 260 ml  Net 2758.39 ml    Filed Weights   11/16/20 0300 11/18/20 0500 11/19/20 0452  Weight: 97.1 kg 103.6 kg 109.2 kg    Examination: Intubated, sedated GCS3 on current sedation level Triggers vent Lungs suprisingly clear Worsening anasarca  Minimal UOP Rising BUN/Cr CBG okay CXR continued ARDS pattern now with some edema on top CBC stable Plts better  Resolved Hospital Problem list   AKI  Hyperkalemia  NGMA secondary to Hyperchloremic Acidosis.   Assessment & Plan:  Recurrent severe ARDS due to massive aspiration events- high P/F ratio persists but slightly better Worsening renal failure- ATN from arrest, not HD candidate Acute anemia- s/p 1 unit 7/23, keep an eye on for now, stable Likely OSA Anoxic brain injury from initial arrest- had been improving slowly Post partial gastrectomy- complicated by recurrent aspiration and gastroparesis Hypothyroidism DM2 with hyperglycemia Lactic acidemia post arrest- improved Muscular deconditioning- from prolonged ICU stay  -  Lighten sedation - 5 days rocephin - Continue vent support - Continued renal decline now seems inevitable: this combined with profound deconditioning, recurrent cardiac arrests, and ongoing GI failure in a 75 year old indicate a terminal clinical status. Will discuss palliative extubation with family  tentatively tomorrow - DNR  Best Practice:  DVT: off  Feeding: TPN on hold with rising K GI: protonix, NGT  Erskine Emery MD PCCM 34 minutes critical care time not including any separately billable procedures

## 2020-11-20 NOTE — Progress Notes (Deleted)
Pt experienced approximately 4 seconds asystole during Echocardiogram when asked to hold his breath.  Pt was asymptomatic and alert and oriented x 4.   Page made to night shift cardiology fellow Donnel Saxon and advised to place defib pads and continue to monitor.

## 2020-11-20 NOTE — Progress Notes (Signed)
Patient ID: [name], female   DOB: May 23, 1945, 75 y.o.   MRN: [id number] [facility] Progress Note:     Subjective: No acute events overnight   Objective: Vital signs in last 24 hours: Temp:  [97.3 F (36.3 C)-98.2 F (36.8 C)] 98.2 F (36.8 C) (07/24 0329) Pulse Rate:  [86] 86 (07/23 2030) Resp:  [20-34] 34 (07/24 0743) BP: (143)/(51) 143/51 (07/23 2030) SpO2:  [86 %-100 %] 95 % (07/24 0743) Arterial Line BP: (112-171)/(42-65) 140/46 (07/24 0600) FiO2 (%):  [50 %-70 %] 50 % (07/24 0743)  Intake/Output from previous day: 07/23 0701 - 07/24 0700 In: 3018.4 [I.V.:2412; IV Piggyback:606.4] Out: 260 [Urine:260] Intake/Output this shift: No intake/output data recorded.   Physical Exam:  Sedated, intubated.   Resp:  comfortable on vent, synchronous CV regular rate and rhythm Abd soft, non tender, non distended.  Incisions healing well without evidence of infection.     Lab Results:  Results for orders placed or performed during the hospital encounter of 10/22/2020 (from the past 48 hour(s))  Glucose, capillary     Status: Abnormal   Collection Time: 11/18/20  8:01 AM  Result Value Ref Range   Glucose-Capillary 172 (H) 70 - 99 mg/dL    Comment: Glucose reference range applies only to samples taken after fasting for at least 8 hours.  Glucose, capillary     Status: Abnormal   Collection Time: 11/18/20  9:39 AM  Result Value Ref Range   Glucose-Capillary 192 (H) 70 - 99 mg/dL    Comment: Glucose reference range applies only to samples taken after fasting for at least 8 hours.  Glucose, capillary     Status: Abnormal   Collection Time: 11/18/20 11:52 AM  Result Value Ref Range   Glucose-Capillary 163 (H) 70 - 99 mg/dL    Comment: Glucose reference range applies only to samples taken after fasting for at least 8 hours.  I-STAT 7, (LYTES, BLD GAS, ICA, H+H)     Status: Abnormal   Collection Time: 11/18/20 12:36 PM  Result Value Ref Range   pH, Arterial  7.317 (L) 7.350 - 7.450   pCO2 arterial 59.3 (H) 32.0 - 48.0 mmHg   pO2, Arterial 82 (L) 83.0 - 108.0 mmHg   Bicarbonate 30.2 (H) 20.0 - 28.0 mmol/L   TCO2 32 22 - 32 mmol/L   O2 Saturation 94.0 %   Acid-Base Excess 4.0 (H) 0.0 - 2.0 mmol/L   Sodium 139 135 - 145 mmol/L   Potassium 4.2 3.5 - 5.1 mmol/L   Calcium, Ion 1.15 1.15 - 1.40 mmol/L   HCT 21.0 (L) 36.0 - 46.0 %   Hemoglobin 7.1 (L) 12.0 - 15.0 g/dL   Patient temperature 37.3 C    Sample type ARTERIAL   Glucose, capillary     Status: Abnormal   Collection Time: 11/18/20  1:34 PM  Result Value Ref Range   Glucose-Capillary 162 (H) 70 - 99 mg/dL    Comment: Glucose reference range applies only to samples taken after fasting for at least 8 hours.  Basic metabolic panel     Status: Abnormal   Collection Time: 11/18/20  2:00 PM  Result Value Ref Range   Sodium 138 135 - 145 mmol/L   Potassium 4.3 3.5 - 5.1 mmol/L   Chloride 100 98 - 111 mmol/L   CO2 28 22 - 32 mmol/L   Glucose, Bld 170 (H) 70 - 99 mg/dL    Comment: Glucose reference range applies only to  samples taken after fasting for at least 8 hours.   BUN 117 (H) 8 - 23 mg/dL   Creatinine, Ser 3.14 (H) 0.44 - 1.00 mg/dL   Calcium 7.8 (L) 8.9 - 10.3 mg/dL   GFR, Estimated 15 (L) >60 mL/min    Comment: (NOTE) Calculated using the CKD-EPI Creatinine Equation (2021)    Anion gap 10 5 - 15    Comment: Performed at [facility] [address] [place], [place] [address]  CBC     Status: Abnormal   Collection Time: 11/18/20  2:00 PM  Result Value Ref Range   WBC 32.4 (H) 4.0 - 10.5 K/uL   RBC 2.33 (L) 3.87 - 5.11 MIL/uL   Hemoglobin 6.7 (LL) 12.0 - 15.0 g/dL    Comment: REPEATED TO VERIFY THIS CRITICAL RESULT HAS VERIFIED AND BEEN CALLED TO K DALISAY RN BY KIRSTENE FORSYTH ON 07 22 2022 AT [id number], AND HAS BEEN READ BACK.     HCT 22.1 (L) 36.0 - 46.0 %   MCV 94.8 80.0 - 100.0 fL   MCH 28.8 26.0 - 34.0 pg   MCHC 30.3 30.0 - 36.0 g/dL   RDW 19.0 (H) 11.5 - 15.5 %    Platelets 89 (L) 150 - 400 K/uL    Comment: Immature Platelet Fraction may be clinically indicated, consider ordering this additional test [id number] CONSISTENT WITH PREVIOUS RESULT REPEATED TO VERIFY    nRBC 0.4 (H) 0.0 - 0.2 %    Comment: Performed at [facility], [place] [address] [place], [place] [address]  Glucose, capillary     Status: Abnormal   Collection Time: 11/18/20  3:03 PM  Result Value Ref Range   Glucose-Capillary 164 (H) 70 - 99 mg/dL    Comment: Glucose reference range applies only to samples taken after fasting for at least 8 hours.  Glucose, capillary     Status: Abnormal   Collection Time: 11/18/20  3:55 PM  Result Value Ref Range   Glucose-Capillary 161 (H) 70 - 99 mg/dL    Comment: Glucose reference range applies only to samples taken after fasting for at least 8 hours.  Prepare RBC (crossmatch)     Status: None   Collection Time: 11/18/20  4:44 PM  Result Value Ref Range   Order Confirmation      ORDER PROCESSED BY BLOOD BANK Performed at [facility], [place] [address], [place], [place] [address]   Glucose, capillary     Status: Abnormal   Collection Time: 11/18/20  5:19 PM  Result Value Ref Range   Glucose-Capillary 166 (H) 70 - 99 mg/dL    Comment: Glucose reference range applies only to samples taken after fasting for at least 8 hours.  Glucose, capillary     Status: Abnormal   Collection Time: 11/18/20  6:29 PM  Result Value Ref Range   Glucose-Capillary 163 (H) 70 - 99 mg/dL    Comment: Glucose reference range applies only to samples taken after fasting for at least 8 hours.  Glucose, capillary     Status: Abnormal   Collection Time: 11/18/20  8:19 PM  Result Value Ref Range   Glucose-Capillary 168 (H) 70 - 99 mg/dL    Comment: Glucose reference range applies only to samples taken after fasting for at least 8 hours.  Glucose, capillary     Status: Abnormal   Collection Time: 11/18/20 10:22 PM  Result Value Ref Range   Glucose-Capillary  158 (H) 70 - 99  mg/dL    Comment: Glucose reference range applies only to samples taken after fasting for at least 8 hours.  Glucose, capillary     Status: Abnormal   Collection Time: 11/19/20 12:25 AM  Result Value Ref Range   Glucose-Capillary 151 (H) 70 - 99 mg/dL    Comment: Glucose reference range applies only to samples taken after fasting for at least 8 hours.  CBC     Status: Abnormal   Collection Time: 11/19/20  2:15 AM  Result Value Ref Range   WBC 37.4 (H) 4.0 - 10.5 K/uL   RBC 2.71 (L) 3.87 - 5.11 MIL/uL   Hemoglobin 7.9 (L) 12.0 - 15.0 g/dL   HCT 25.3 (L) 36.0 - 46.0 %   MCV 93.4 80.0 - 100.0 fL   MCH 29.2 26.0 - 34.0 pg   MCHC 31.2 30.0 - 36.0 g/dL   RDW 18.5 (H) 11.5 - 15.5 %   Platelets 93 (L) 150 - 400 K/uL    Comment: Immature Platelet Fraction may be clinically indicated, consider ordering this additional test [id number] CONSISTENT WITH PREVIOUS RESULT REPEATED TO VERIFY    nRBC 0.5 (H) 0.0 - 0.2 %    Comment: Performed at [facility], [place] [address], [place], [place] [address]  Glucose, capillary     Status: Abnormal   Collection Time: 11/19/20  2:21 AM  Result Value Ref Range   Glucose-Capillary 152 (H) 70 - 99 mg/dL    Comment: Glucose reference range applies only to samples taken after fasting for at least 8 hours.  Basic metabolic panel     Status: Abnormal   Collection Time: 11/19/20  2:24 AM  Result Value Ref Range   Sodium 136 135 - 145 mmol/L   Potassium 4.8 3.5 - 5.1 mmol/L   Chloride 98 98 - 111 mmol/L   CO2 26 22 - 32 mmol/L   Glucose, Bld 143 (H) 70 - 99 mg/dL    Comment: Glucose reference range applies only to samples taken after fasting for at least 8 hours.   BUN 121 (H) 8 - 23 mg/dL   Creatinine, Ser 3.41 (H) 0.44 - 1.00 mg/dL   Calcium 8.2 (L) 8.9 - 10.3 mg/dL   GFR, Estimated 13 (L) >60 mL/min    Comment: (NOTE) Calculated using the CKD-EPI Creatinine Equation (2021)    Anion gap 12 5 - 15    Comment: Performed at [facility] [address] [place], [place] [address]  Glucose, capillary     Status: Abnormal   Collection Time: 11/19/20  4:31 AM  Result Value Ref Range   Glucose-Capillary 130 (H) 70 - 99 mg/dL    Comment: Glucose reference range applies only to samples taken after fasting for at least 8 hours.  Glucose, capillary     Status: Abnormal   Collection Time: 11/19/20  5:44 AM  Result Value Ref Range   Glucose-Capillary 142 (H) 70 - 99 mg/dL    Comment: Glucose reference range applies only to samples taken after fasting for at least 8 hours.  Glucose, capillary     Status: Abnormal   Collection Time: 11/19/20  6:44 AM  Result Value Ref Range   Glucose-Capillary 152 (H) 70 - 99 mg/dL    Comment: Glucose reference range applies only to samples taken after fasting for at least 8 hours.  Glucose, capillary     Status: Abnormal   Collection Time: 11/19/20  7:36 AM  Result Value Ref Range  Glucose-Capillary 141 (H) 70 - 99 mg/dL    Comment: Glucose reference range applies only to samples taken after fasting for at least 8 hours.  Glucose, capillary     Status: Abnormal   Collection Time: 11/19/20  9:19 AM  Result Value Ref Range   Glucose-Capillary 138 (H) 70 - 99 mg/dL    Comment: Glucose reference range applies only to samples taken after fasting for at least 8 hours.  Glucose, capillary     Status: Abnormal   Collection Time: 11/19/20 10:23 AM  Result Value Ref Range   Glucose-Capillary 145 (H) 70 - 99 mg/dL    Comment: Glucose reference range applies only to samples taken after fasting for at least 8 hours.  Glucose, capillary     Status: Abnormal   Collection Time: 11/19/20 11:23 AM  Result Value Ref Range   Glucose-Capillary 135 (H) 70 - 99 mg/dL    Comment: Glucose reference range applies only to samples taken after fasting for at least 8 hours.  Glucose, capillary     Status: Abnormal   Collection Time: 11/19/20  1:49 PM  Result Value Ref Range   Glucose-Capillary 141 (H) 70 -  99 mg/dL    Comment: Glucose reference range applies only to samples taken after fasting for at least 8 hours.  Glucose, capillary     Status: Abnormal   Collection Time: 11/19/20  3:22 PM  Result Value Ref Range   Glucose-Capillary 151 (H) 70 - 99 mg/dL    Comment: Glucose reference range applies only to samples taken after fasting for at least 8 hours.  Glucose, capillary     Status: Abnormal   Collection Time: 11/19/20  4:01 PM  Result Value Ref Range   Glucose-Capillary 137 (H) 70 - 99 mg/dL    Comment: Glucose reference range applies only to samples taken after fasting for at least 8 hours.  Glucose, capillary     Status: Abnormal   Collection Time: 11/19/20  5:36 PM  Result Value Ref Range   Glucose-Capillary 124 (H) 70 - 99 mg/dL    Comment: Glucose reference range applies only to samples taken after fasting for at least 8 hours.  Glucose, capillary     Status: Abnormal   Collection Time: 11/19/20  6:25 PM  Result Value Ref Range   Glucose-Capillary 120 (H) 70 - 99 mg/dL    Comment: Glucose reference range applies only to samples taken after fasting for at least 8 hours.  Glucose, capillary     Status: Abnormal   Collection Time: 11/19/20  7:24 PM  Result Value Ref Range   Glucose-Capillary 149 (H) 70 - 99 mg/dL    Comment: Glucose reference range applies only to samples taken after fasting for at least 8 hours.  Glucose, capillary     Status: Abnormal   Collection Time: 11/19/20  8:32 PM  Result Value Ref Range   Glucose-Capillary 139 (H) 70 - 99 mg/dL    Comment: Glucose reference range applies only to samples taken after fasting for at least 8 hours.  Glucose, capillary     Status: Abnormal   Collection Time: 11/19/20  9:37 PM  Result Value Ref Range   Glucose-Capillary 148 (H) 70 - 99 mg/dL    Comment: Glucose reference range applies only to samples taken after fasting for at least 8 hours.  Glucose, capillary     Status: Abnormal   Collection Time: 11/19/20 10:45 PM   Result Value Ref Range   Glucose-Capillary  140 (H) 70 - 99 mg/dL    Comment: Glucose reference range applies only to samples taken after fasting for at least 8 hours.  Glucose, capillary     Status: Abnormal   Collection Time: 11/19/20 11:49 PM  Result Value Ref Range   Glucose-Capillary 137 (H) 70 - 99 mg/dL    Comment: Glucose reference range applies only to samples taken after fasting for at least 8 hours.  Glucose, capillary     Status: Abnormal   Collection Time: 11/20/20 12:54 AM  Result Value Ref Range   Glucose-Capillary 134 (H) 70 - 99 mg/dL    Comment: Glucose reference range applies only to samples taken after fasting for at least 8 hours.  Glucose, capillary     Status: Abnormal   Collection Time: 11/20/20  1:57 AM  Result Value Ref Range   Glucose-Capillary 122 (H) 70 - 99 mg/dL    Comment: Glucose reference range applies only to samples taken after fasting for at least 8 hours.  Glucose, capillary     Status: Abnormal   Collection Time: 11/20/20  3:36 AM  Result Value Ref Range   Glucose-Capillary 125 (H) 70 - 99 mg/dL    Comment: Glucose reference range applies only to samples taken after fasting for at least 8 hours.  CBC     Status: Abnormal   Collection Time: 11/20/20  4:19 AM  Result Value Ref Range   WBC 41.6 (H) 4.0 - 10.5 K/uL   RBC 2.66 (L) 3.87 - 5.11 MIL/uL   Hemoglobin 7.7 (L) 12.0 - 15.0 g/dL   HCT 25.0 (L) 36.0 - 46.0 %   MCV 94.0 80.0 - 100.0 fL   MCH 28.9 26.0 - 34.0 pg   MCHC 30.8 30.0 - 36.0 g/dL   RDW 18.0 (H) 11.5 - 15.5 %   Platelets 105 (L) 150 - 400 K/uL    Comment: Immature Platelet Fraction may be clinically indicated, consider ordering this additional test [id number] CONSISTENT WITH PREVIOUS RESULT REPEATED TO VERIFY    nRBC 0.5 (H) 0.0 - 0.2 %    Comment: Performed at [facility], [address], [place], [place] [id number]  Basic metabolic panel     Status: Abnormal   Collection Time: 11/20/20  4:19 AM  Result Value Ref  Range   Sodium 133 (L) 135 - 145 mmol/L   Potassium 5.4 (H) 3.5 - 5.1 mmol/L   Chloride 99 98 - 111 mmol/L   CO2 23 22 - 32 mmol/L   Glucose, Bld 141 (H) 70 - 99 mg/dL    Comment: Glucose reference range applies only to samples taken after fasting for at least 8 hours.   BUN 143 (H) 8 - 23 mg/dL   Creatinine, Ser 3.85 (H) 0.44 - 1.00 mg/dL   Calcium 8.1 (L) 8.9 - 10.3 mg/dL   GFR, Estimated 12 (L) >60 mL/min    Comment: (NOTE) Calculated using the CKD-EPI Creatinine Equation (2021)    Anion gap 11 5 - 15    Comment: Performed at [facility] [address], [place], [place] [address]  Magnesium     Status: None   Collection Time: 11/20/20  4:19 AM  Result Value Ref Range   Magnesium 2.4 1.7 - 2.4 mg/dL    Comment: Performed at [facility] [address], [place], [place] [address]  Phosphorus     Status: None   Collection Time: 11/20/20  4:19 AM  Result Value Ref  Range   Phosphorus 4.6 2.5 - 4.6 mg/dL    Comment: Performed at [facility] [address] [place], [place] [address]  Glucose, capillary     Status: Abnormal   Collection Time: 11/20/20  4:40 AM  Result Value Ref Range   Glucose-Capillary 132 (H) 70 - 99 mg/dL    Comment: Glucose reference range applies only to samples taken after fasting for at least 8 hours.  Glucose, capillary     Status: Abnormal   Collection Time: 11/20/20  5:55 AM  Result Value Ref Range   Glucose-Capillary 128 (H) 70 - 99 mg/dL    Comment: Glucose reference range applies only to samples taken after fasting for at least 8 hours.  Glucose, capillary     Status: Abnormal   Collection Time: 11/20/20  6:53 AM  Result Value Ref Range   Glucose-Capillary 132 (H) 70 - 99 mg/dL    Comment: Glucose reference range applies only to samples taken after fasting for at least 8 hours.    Radiology/Results: DG Abd 1 View  Result Date: 11/18/2020 CLINICAL DATA:  ARDS.  Ileus. EXAM: ABDOMEN - 1 VIEW COMPARISON:  One-view abdomen  10/11/2020 one-view chest x-ray 11/18/2020 FINDINGS: NG tube demonstrated on the chest x-ray is not imaged on this film. Relatively gasless abdomen noted. No bowel distension present. No evidence for free air. IMPRESSION: Relatively gasless abdomen without evidence for obstruction or free air. Electronically Signed   By: San Morelle M.D.   On: 11/18/2020 11:30   DG Chest Port 1 View  Result Date: 11/18/2020 CLINICAL DATA:  ARDS, ileus EXAM: PORTABLE CHEST 1 VIEW COMPARISON:  Portable exam 1102 hours compared to 1023 hours FINDINGS: Tip of endotracheal tube projects 1.6 cm above carina. Nasogastric tube extends into stomach. LEFT jugular line with tip projecting over SVC. Stable heart size and mediastinal contours. Atherosclerotic calcification aorta. BILATERAL pulmonary infiltrates unchanged. No pleural effusion or pneumothorax. Bones demineralized with prior cervicothoracic fusion. IMPRESSION: Persistent pulmonary infiltrates. Aortic Atherosclerosis (ICD10-I70.0). Electronically Signed   By: Lavonia Dana M.D.   On: 11/18/2020 14:05    Anti-infectives: Anti-infectives (From admission, onward)    Start     Dose/Rate Route Frequency Ordered Stop   11/18/20 2100  cefTRIAXone (ROCEPHIN) 2 g in sodium chloride 0.9 % 100 mL IVPB        2 g 200 mL/hr over 30 Minutes Intravenous Every 24 hours 11/18/20 0830     11/18/20 1200  anidulafungin (ERAXIS) 100 mg in sodium chloride 0.9 % 100 mL IVPB  Status:  Discontinued        100 mg 78 mL/hr over 100 Minutes Intravenous Every 24 hours 11/17/20 1104 11/18/20 0827   11/17/20 2121  ceFEPIme (MAXIPIME) 2 g in sodium chloride 0.9 % 100 mL IVPB  Status:  Discontinued        2 g 200 mL/hr over 30 Minutes Intravenous Every 24 hours 11/16/20 2134 11/18/20 0829   11/17/20 1615  metroNIDAZOLE (FLAGYL) IVPB 500 mg        500 mg 100 mL/hr over 60 Minutes Intravenous Every 8 hours 11/17/20 0812     11/17/20 1430  anidulafungin (ERAXIS) 200 mg in sodium chloride  0.9 % 200 mL IVPB        200 mg 78 mL/hr over 200 Minutes Intravenous  Once 11/17/20 1424 11/17/20 1850   11/17/20 1200  anidulafungin (ERAXIS) 200 mg in sodium chloride 0.9 % 200 mL IVPB  Status:  Discontinued  200 mg 78 mL/hr over 200 Minutes Intravenous Every 24 hours 11/17/20 1104 11/17/20 1424   11/16/20 1000  ceFEPIme (MAXIPIME) 2 g in sodium chloride 0.9 % 100 mL IVPB  Status:  Discontinued        2 g 200 mL/hr over 30 Minutes Intravenous Every 12 hours 11/16/20 0746 11/16/20 2134   11/15/20 2000  vancomycin (VANCOCIN) IVPB 1000 mg/200 mL premix  Status:  Discontinued        1,000 mg 200 mL/hr over 60 Minutes Intravenous Every 24 hours 11/15/20 0827 11/16/20 0759   11/15/20 1800  ceFEPIme (MAXIPIME) 2 g in sodium chloride 0.9 % 100 mL IVPB  Status:  Discontinued        2 g 200 mL/hr over 30 Minutes Intravenous Every 24 hours 11/15/20 0827 11/16/20 0746   11/14/20 1945  ceFEPIme (MAXIPIME) 2 g in sodium chloride 0.9 % 100 mL IVPB  Status:  Discontinued        2 g 200 mL/hr over 30 Minutes Intravenous Every 12 hours 11/14/20 1853 11/15/20 0827   11/14/20 1945  vancomycin (VANCOCIN) IVPB 1000 mg/200 mL premix  Status:  Discontinued        1,000 mg 200 mL/hr over 60 Minutes Intravenous Every 24 hours 11/14/20 1855 11/15/20 0827   11/14/20 1615  cefTRIAXone (ROCEPHIN) 2 g in sodium chloride 0.9 % 100 mL IVPB  Status:  Discontinued        2 g 200 mL/hr over 30 Minutes Intravenous Every 24 hours 11/14/20 1521 11/14/20 1802   11/14/20 1615  metroNIDAZOLE (FLAGYL) IVPB 500 mg  Status:  Discontinued        500 mg 100 mL/hr over 60 Minutes Intravenous Every 8 hours 11/14/20 1521 11/17/20 0812   11/04/20 1245  cefTRIAXone (ROCEPHIN) 2 g in sodium chloride 0.9 % 100 mL IVPB        2 g 200 mL/hr over 30 Minutes Intravenous Every 24 hours 11/04/20 1148 11/06/20 1302   11/02/20 1000  cefTRIAXone (ROCEPHIN) 2 g in sodium chloride 0.9 % 100 mL IVPB  Status:  Discontinued        2 g 200  mL/hr over 30 Minutes Intravenous Every 24 hours 11/01/20 1257 11/02/20 0856   11/02/20 1000  doxycycline (VIBRAMYCIN) 100 mg in sodium chloride 0.9 % 250 mL IVPB  Status:  Discontinued        100 mg 125 mL/hr over 120 Minutes Intravenous Every 12 hours 11/01/20 1257 11/02/20 0856   11/02/20 1000  ceFEPIme (MAXIPIME) 2 g in sodium chloride 0.9 % 100 mL IVPB  Status:  Discontinued        2 g 200 mL/hr over 30 Minutes Intravenous Every 12 hours 11/02/20 0856 11/04/20 1148   11/01/20 0915  levofloxacin (LEVAQUIN) IVPB 750 mg  Status:  Discontinued        750 mg 100 mL/hr over 90 Minutes Intravenous Every 24 hours 11/01/20 0827 11/01/20 1257   11/01/20 0730  levofloxacin (LEVAQUIN) IVPB 500 mg  Status:  Discontinued        500 mg 100 mL/hr over 60 Minutes Intravenous Every 24 hours 11/01/20 0631 11/01/20 1257   10/02/2020 2100  ciprofloxacin (CIPRO) IVPB 400 mg        400 mg 200 mL/hr over 60 Minutes Intravenous Every 12 hours 10/20/2020 1258 10/20/2020 2148   10/09/2020 0600  ciprofloxacin (CIPRO) IVPB 400 mg        400 mg 200 mL/hr over 60 Minutes Intravenous On call to  O.R. 10/02/2020 0547 10/19/2020 0847       Assessment/Plan: Problem List: Patient Active Problem List   Diagnosis Date Noted   ARDS (adult respiratory distress syndrome) ([place])    Septic shock ([place])    Pressure injury of skin 11/04/2020   Cardiac arrest ([place])    Emesis    Respiratory failure ([facility])    SOB (shortness of breath)    Acute respiratory failure with hypoxemia ([place])    Encounter for central line placement    S/P partial gastrectomy 10/08/2020   Type 2 diabetes mellitus without complication ([place]) [id number]   Hyperlipidemia 03/01/2014   Focal seizures ([place]) 10/21/2013   TIA (transient ischemic attack) 10/21/2013   CHEST PAIN 01/24/2010   BRADYCARDIA 12/23/2009   Diabetes ([place]) 08/04/2008   HYPOTHYROIDISM 03/06/2007   OBESITY 03/06/2007   OBSTRUCTIVE SLEEP APNEA 03/06/2007   HYPERTENSION, BENIGN 03/06/2007    CORONARY ARTERY DISEASE 03/06/2007   ALLERGIC RHINITIS 03/06/2007   DEGENERATIVE JOINT DISEASE, GENERALIZED 03/06/2007   BACK PAIN, CHRONIC 03/06/2007   Now s/p large aspiration and code x 2.  Remains on pressors (epi only this AM) with worsening acute renal failure/oliguria, rising WBC  Efferent limb obstruction.  If we can stabilize her, this will need to be revised.    TNA for severe protein calorie malnutrition and intolerance of enteric feeding.  Pressors weaning as tolerated.   Antifungals added to broad spectrum antibiotics  Stress dose steroids.   Continue keppra.    Likely poor prognosis given second cardiac arrest. Was making slow improvements prior to this event.   HD unlikely to improve survival.  No HD to be offered.  Appreciate palliative care and CCM care.  Current plan is DNR, no dialysis, no escalation of care but will maintain current state over the weekend for family visitation with anticipated terminal extubation on Monday.   LOS: 26 days   [facility] [phone] for weekday/non holidays Check [email or web address] for coverage night/weekend/holidays  Do not use SecureChat as it is not reliable for timely patient care.     11/20/2020 7:46 AM

## 2020-11-20 NOTE — Progress Notes (Signed)
Palliative:  75 y.o. female  with past medical history of hypothyroidism, T2DM, OSA, HTN, CAD, DJD, chronic back pain, focal seizures, TIA, and HLD admitted on 10/20/2020 with for elective robotic distal gastrectomy as surgical intervention for recently found HGD polyp of gastric antrum with focal intramucosal adenocarcinoma. She had procedure done 6/28.  Pathology was negative for malignancy but showed LGD with negative nodes. Patient experienced cardiac arrest early AM of 7/5 - had 6 minutes of CPR and was intubated.  Extubated 7/12. Was improving but then experienced respiratory distress 7/18 and had second cardiac arrest 7/20 following massive aspiration. Required prone ventilation following arrest and 3 vasopressors. Also now with worsening renal failure. PMT consulted to discuss [place].  I met today at [name]' bedside with her husband. I introduced myself from palliative care and he had met with my colleague a couple days ago. [name] expresses understanding of his wife's unfortunate condition and lack of improvement. He tearfully shares with me that he knows that she cannot go on like this. We reviewed the difficulty in letting go vs seeing his wife of 48 years in this condition. He verifies that this is not a quality of life for anyone. He plans to discuss with his son about timing of transition off life support to comfort. He does note that their daughter is planning to come from [place] on Wednesday. We discussed the potential of transition as early as tomorrow. He would like to speak more with his family and I will follow up with him in the morning to discuss further.   All questions/concerns addressed. Emotional support provided.   Exam: Unresponsive. No distress. Tolerating vent 50% FiO2. Anasarca.   Plan: - Will follow up tomorrow for potential one way extubation.   [place], NP Palliative Medicine Team Pager [phone] (Please see [email or web address] for schedule) Team  Phone [phone]    Greater than 50%  of this time was spent counseling and coordinating care related to the above assessment and plan

## 2020-11-21 DIAGNOSIS — I469 Cardiac arrest, cause unspecified: Secondary | ICD-10-CM | POA: Diagnosis not present

## 2020-11-21 DIAGNOSIS — J9601 Acute respiratory failure with hypoxia: Secondary | ICD-10-CM | POA: Diagnosis not present

## 2020-11-21 DIAGNOSIS — Z515 Encounter for palliative care: Secondary | ICD-10-CM | POA: Diagnosis not present

## 2020-11-21 DIAGNOSIS — Z7189 Other specified counseling: Secondary | ICD-10-CM | POA: Diagnosis not present

## 2020-11-21 LAB — COMPREHENSIVE METABOLIC PANEL
ALT: 32 U/L (ref 0–44)
AST: 86 U/L — ABNORMAL HIGH (ref 15–41)
Albumin: 1.5 g/dL — ABNORMAL LOW (ref 3.5–5.0)
Alkaline Phosphatase: 93 U/L (ref 38–126)
Anion gap: 12 (ref 5–15)
BUN: 158 mg/dL — ABNORMAL HIGH (ref 8–23)
CO2: 20 mmol/L — ABNORMAL LOW (ref 22–32)
Calcium: 8.2 mg/dL — ABNORMAL LOW (ref 8.9–10.3)
Chloride: 98 mmol/L (ref 98–111)
Creatinine, Ser: 4.59 mg/dL — ABNORMAL HIGH (ref 0.44–1.00)
GFR, Estimated: 9 mL/min — ABNORMAL LOW (ref >60)
Glucose, Bld: 222 mg/dL — ABNORMAL HIGH (ref 70–99)
Potassium: 7.2 mmol/L (ref 3.5–5.1)
Sodium: 130 mmol/L — ABNORMAL LOW (ref 135–145)
Total Bilirubin: 4.2 mg/dL — ABNORMAL HIGH (ref 0.3–1.2)
Total Protein: 5.3 g/dL — ABNORMAL LOW (ref 6.5–8.1)

## 2020-11-21 LAB — CBC
HCT: 29.8 % — ABNORMAL LOW (ref 36.0–46.0)
Hemoglobin: 9 g/dL — ABNORMAL LOW (ref 12.0–15.0)
MCH: 29 pg (ref 26.0–34.0)
MCHC: 30.2 g/dL (ref 30.0–36.0)
MCV: 96.1 fL (ref 80.0–100.0)
Platelets: 172 10*3/uL (ref 150–400)
RBC: 3.1 MIL/uL — ABNORMAL LOW (ref 3.87–5.11)
RDW: 18.6 % — ABNORMAL HIGH (ref 11.5–15.5)
WBC: 50.9 10*3/uL (ref 4.0–10.5)
nRBC: 4 % — ABNORMAL HIGH (ref 0.0–0.2)

## 2020-11-21 LAB — GLUCOSE, CAPILLARY
Glucose-Capillary: 140 mg/dL — ABNORMAL HIGH (ref 70–99)
Glucose-Capillary: 124 mg/dL — ABNORMAL HIGH (ref 70–99)

## 2020-11-21 LAB — MAGNESIUM: Magnesium: 2.3 mg/dL (ref 1.7–2.4)

## 2020-11-21 LAB — TRIGLYCERIDES: Triglycerides: 324 mg/dL — ABNORMAL HIGH (ref <150)

## 2020-11-21 LAB — PHOSPHORUS: Phosphorus: 4.8 mg/dL — ABNORMAL HIGH (ref 2.5–4.6)

## 2020-11-21 MED ORDER — MIDAZOLAM 50MG/50ML (1MG/ML) PREMIX INFUSION
0.0000 mg/h | INTRAVENOUS | Status: DC
  Administered 2020-11-21: 2 mg/h via INTRAVENOUS
  Filled 2020-11-21: qty 50

## 2020-11-21 MED ORDER — DIPHENHYDRAMINE HCL 50 MG/ML IJ SOLN: 25.0000 mg | INTRAMUSCULAR | Status: DC | PRN

## 2020-11-21 MED ORDER — ACETAMINOPHEN 650 MG RE SUPP: 650.0000 mg | Freq: Four times a day (QID) | RECTAL | Status: DC | PRN

## 2020-11-21 MED ORDER — GLYCOPYRROLATE 0.2 MG/ML IJ SOLN: 0.2000 mg | INTRAMUSCULAR | Status: DC | PRN

## 2020-11-21 MED ORDER — ACETAMINOPHEN 325 MG PO TABS: 650.0000 mg | ORAL_TABLET | Freq: Four times a day (QID) | ORAL | Status: DC | PRN

## 2020-11-21 MED ORDER — POLYVINYL ALCOHOL 1.4 % OP SOLN
1.0000 [drp] | Freq: Four times a day (QID) | OPHTHALMIC | Status: DC | PRN
  Filled 2020-11-21: qty 15

## 2020-11-21 MED ORDER — FENTANYL BOLUS VIA INFUSION: 25.0000 ug | INTRAVENOUS | Status: DC | PRN

## 2020-11-21 MED ORDER — DEXTROSE 5 % IV SOLN: INTRAVENOUS | Status: DC

## 2020-11-21 MED ORDER — GLYCOPYRROLATE 1 MG PO TABS
1.0000 mg | ORAL_TABLET | ORAL | Status: DC | PRN
  Filled 2020-11-21: qty 1

## 2020-11-21 MED ORDER — MORPHINE SULFATE (PF) 2 MG/ML IV SOLN: 2.0000 mg | INTRAVENOUS | Status: DC | PRN

## 2020-11-21 MED ORDER — MIDAZOLAM HCL 2 MG/2ML IJ SOLN: 1.0000 mg | INTRAMUSCULAR | Status: DC | PRN

## 2020-11-21 MED ORDER — GLYCOPYRROLATE 0.2 MG/ML IJ SOLN
0.6000 mg | INTRAMUSCULAR | Status: DC
  Administered 2020-11-21: 0.6 mg via INTRAVENOUS
  Filled 2020-11-21: qty 3

## 2020-11-21 MED ORDER — HALOPERIDOL LACTATE 5 MG/ML IJ SOLN: 2.5000 mg | INTRAMUSCULAR | Status: DC | PRN

## 2020-11-21 MED ORDER — MIDAZOLAM BOLUS VIA INFUSION (WITHDRAWAL LIFE SUSTAINING TX)
2.0000 mg | INTRAVENOUS | Status: DC | PRN
  Filled 2020-11-21: qty 2

## 2020-11-22 LAB — PATHOLOGIST SMEAR REVIEW

## 2020-11-24 LAB — FUNGUS CULTURE, BLOOD
Culture: NO GROWTH
Special Requests: ADEQUATE

## 2020-11-28 NOTE — Progress Notes (Signed)
Patient ID: [name], female   DOB: 1945-06-14, 75 y.o.   MRN: [id number] [facility] Surgery Progress Note:     Subjective: Still very oliguric.  WBCs significantly more elevated today as well as potassium.     Objective: Vital signs in last 24 hours: Temp:  [98 F (36.7 C)-100.4 F (38 C)] 100.1 F (37.8 C) (07/25 0735) Pulse Rate:  [119] 119 (07/25 0837) Resp:  [11-43] 20 (07/25 0930) BP: (86)/(52) 86/52 (07/25 0837) SpO2:  [90 %-95 %] 92 % (07/25 0930) Arterial Line BP: (82-151)/(42-59) 96/53 (07/25 0930) FiO2 (%):  [50 %-100 %] 100 % (07/25 0845) Weight:  [108.9 kg] 108.9 kg (07/25 0451)  Intake/Output from previous day: 07/24 0701 - 07/25 0700 In: 509.8 [I.V.:306.5; IV Piggyback:203.3] Out: 80 [Urine:80] Intake/Output this shift: No intake/output data recorded.   Physical Exam:  Sedated, intubated.   Resp:  comfortable on vent, synchronous CV regular rate and rhythm Abd soft, non tender, non distended.  Incisions healing well without evidence of infection.   Ext- some anasarca.     Lab Results:  Results for orders placed or performed during the hospital encounter of 10/02/2020 (from the past 48 hour(s))  Glucose, capillary     Status: Abnormal   Collection Time: 11/19/20 10:23 AM  Result Value Ref Range   Glucose-Capillary 145 (H) 70 - 99 mg/dL    Comment: Glucose reference range applies only to samples taken after fasting for at least 8 hours.  Glucose, capillary     Status: Abnormal   Collection Time: 11/19/20 11:23 AM  Result Value Ref Range   Glucose-Capillary 135 (H) 70 - 99 mg/dL    Comment: Glucose reference range applies only to samples taken after fasting for at least 8 hours.  Glucose, capillary     Status: Abnormal   Collection Time: 11/19/20  1:49 PM  Result Value Ref Range   Glucose-Capillary 141 (H) 70 - 99 mg/dL    Comment: Glucose reference range applies only to samples taken after fasting for at least 8 hours.  Glucose,  capillary     Status: Abnormal   Collection Time: 11/19/20  3:22 PM  Result Value Ref Range   Glucose-Capillary 151 (H) 70 - 99 mg/dL    Comment: Glucose reference range applies only to samples taken after fasting for at least 8 hours.  Glucose, capillary     Status: Abnormal   Collection Time: 11/19/20  4:01 PM  Result Value Ref Range   Glucose-Capillary 137 (H) 70 - 99 mg/dL    Comment: Glucose reference range applies only to samples taken after fasting for at least 8 hours.  Glucose, capillary     Status: Abnormal   Collection Time: 11/19/20  5:36 PM  Result Value Ref Range   Glucose-Capillary 124 (H) 70 - 99 mg/dL    Comment: Glucose reference range applies only to samples taken after fasting for at least 8 hours.  Glucose, capillary     Status: Abnormal   Collection Time: 11/19/20  6:25 PM  Result Value Ref Range   Glucose-Capillary 120 (H) 70 - 99 mg/dL    Comment: Glucose reference range applies only to samples taken after fasting for at least 8 hours.  Glucose, capillary     Status: Abnormal   Collection Time: 11/19/20  7:24 PM  Result Value Ref Range   Glucose-Capillary 149 (H) 70 - 99 mg/dL    Comment: Glucose reference range applies only to samples taken after fasting for  at least 8 hours.  Glucose, capillary     Status: Abnormal   Collection Time: 11/19/20  8:32 PM  Result Value Ref Range   Glucose-Capillary 139 (H) 70 - 99 mg/dL    Comment: Glucose reference range applies only to samples taken after fasting for at least 8 hours.  Glucose, capillary     Status: Abnormal   Collection Time: 11/19/20  9:37 PM  Result Value Ref Range   Glucose-Capillary 148 (H) 70 - 99 mg/dL    Comment: Glucose reference range applies only to samples taken after fasting for at least 8 hours.  Glucose, capillary     Status: Abnormal   Collection Time: 11/19/20 10:45 PM  Result Value Ref Range   Glucose-Capillary 140 (H) 70 - 99 mg/dL    Comment: Glucose reference range applies only to  samples taken after fasting for at least 8 hours.  Glucose, capillary     Status: Abnormal   Collection Time: 11/19/20 11:49 PM  Result Value Ref Range   Glucose-Capillary 137 (H) 70 - 99 mg/dL    Comment: Glucose reference range applies only to samples taken after fasting for at least 8 hours.  Glucose, capillary     Status: Abnormal   Collection Time: 11/20/20 12:54 AM  Result Value Ref Range   Glucose-Capillary 134 (H) 70 - 99 mg/dL    Comment: Glucose reference range applies only to samples taken after fasting for at least 8 hours.  Glucose, capillary     Status: Abnormal   Collection Time: 11/20/20  1:57 AM  Result Value Ref Range   Glucose-Capillary 122 (H) 70 - 99 mg/dL    Comment: Glucose reference range applies only to samples taken after fasting for at least 8 hours.  Glucose, capillary     Status: Abnormal   Collection Time: 11/20/20  3:36 AM  Result Value Ref Range   Glucose-Capillary 125 (H) 70 - 99 mg/dL    Comment: Glucose reference range applies only to samples taken after fasting for at least 8 hours.  CBC     Status: Abnormal   Collection Time: 11/20/20  4:19 AM  Result Value Ref Range   WBC 41.6 (H) 4.0 - 10.5 K/uL   RBC 2.66 (L) 3.87 - 5.11 MIL/uL   Hemoglobin 7.7 (L) 12.0 - 15.0 g/dL   HCT 25.0 (L) 36.0 - 46.0 %   MCV 94.0 80.0 - 100.0 fL   MCH 28.9 26.0 - 34.0 pg   MCHC 30.8 30.0 - 36.0 g/dL   RDW 18.0 (H) 11.5 - 15.5 %   Platelets 105 (L) 150 - 400 K/uL    Comment: Immature Platelet Fraction may be clinically indicated, consider ordering this additional test [id number] CONSISTENT WITH PREVIOUS RESULT REPEATED TO VERIFY    nRBC 0.5 (H) 0.0 - 0.2 %    Comment: Performed at [facility], [address], [place], [place] [id number]  Basic metabolic panel     Status: Abnormal   Collection Time: 11/20/20  4:19 AM  Result Value Ref Range   Sodium 133 (L) 135 - 145 mmol/L   Potassium 5.4 (H) 3.5 - 5.1 mmol/L   Chloride 99 98 - 111 mmol/L   CO2 23 22 -  32 mmol/L   Glucose, Bld 141 (H) 70 - 99 mg/dL    Comment: Glucose reference range applies only to samples taken after fasting for at least 8 hours.   BUN 143 (H) 8 - 23 mg/dL  Creatinine, Ser 3.85 (H) 0.44 - 1.00 mg/dL   Calcium 8.1 (L) 8.9 - 10.3 mg/dL   GFR, Estimated 12 (L) >60 mL/min    Comment: (NOTE) Calculated using the CKD-EPI Creatinine Equation (2021)    Anion gap 11 5 - 15    Comment: Performed at [facility] [address] [place], [place] [address]  Magnesium     Status: None   Collection Time: 11/20/20  4:19 AM  Result Value Ref Range   Magnesium 2.4 1.7 - 2.4 mg/dL    Comment: Performed at [facility] [address] [place], [place] [address]  Phosphorus     Status: None   Collection Time: 11/20/20  4:19 AM  Result Value Ref Range   Phosphorus 4.6 2.5 - 4.6 mg/dL    Comment: Performed at [facility] [address] [place], [place] [address]  Glucose, capillary     Status: Abnormal   Collection Time: 11/20/20  4:40 AM  Result Value Ref Range   Glucose-Capillary 132 (H) 70 - 99 mg/dL    Comment: Glucose reference range applies only to samples taken after fasting for at least 8 hours.  Glucose, capillary     Status: Abnormal   Collection Time: 11/20/20  5:55 AM  Result Value Ref Range   Glucose-Capillary 128 (H) 70 - 99 mg/dL    Comment: Glucose reference range applies only to samples taken after fasting for at least 8 hours.  Glucose, capillary     Status: Abnormal   Collection Time: 11/20/20  6:53 AM  Result Value Ref Range   Glucose-Capillary 132 (H) 70 - 99 mg/dL    Comment: Glucose reference range applies only to samples taken after fasting for at least 8 hours.  Glucose, capillary     Status: Abnormal   Collection Time: 11/20/20  8:10 AM  Result Value Ref Range   Glucose-Capillary 110 (H) 70 - 99 mg/dL    Comment: Glucose reference range applies only to samples taken after fasting for at least 8 hours.  Glucose, capillary      Status: None   Collection Time: 11/20/20 11:39 AM  Result Value Ref Range   Glucose-Capillary 80 70 - 99 mg/dL    Comment: Glucose reference range applies only to samples taken after fasting for at least 8 hours.  Glucose, capillary     Status: None   Collection Time: 11/20/20  2:01 PM  Result Value Ref Range   Glucose-Capillary 89 70 - 99 mg/dL    Comment: Glucose reference range applies only to samples taken after fasting for at least 8 hours.  Glucose, capillary     Status: None   Collection Time: 11/20/20  3:20 PM  Result Value Ref Range   Glucose-Capillary 86 70 - 99 mg/dL    Comment: Glucose reference range applies only to samples taken after fasting for at least 8 hours.  Glucose, capillary     Status: Abnormal   Collection Time: 11/20/20  3:49 PM  Result Value Ref Range   Glucose-Capillary 109 (H) 70 - 99 mg/dL    Comment: Glucose reference range applies only to samples taken after fasting for at least 8 hours.  Glucose, capillary     Status: Abnormal   Collection Time: 11/20/20  8:08 PM  Result Value Ref Range   Glucose-Capillary 157 (H) 70 - 99 mg/dL    Comment: Glucose reference range applies only to samples taken after fasting for at least  8 hours.  Glucose, capillary     Status: Abnormal   Collection Time: 11/20/20 11:57 PM  Result Value Ref Range   Glucose-Capillary 147 (H) 70 - 99 mg/dL    Comment: Glucose reference range applies only to samples taken after fasting for at least 8 hours.  Glucose, capillary     Status: Abnormal   Collection Time: 2020-12-09  3:32 AM  Result Value Ref Range   Glucose-Capillary 140 (H) 70 - 99 mg/dL    Comment: Glucose reference range applies only to samples taken after fasting for at least 8 hours.  CBC     Status: Abnormal   Collection Time: 2020-12-09  4:57 AM  Result Value Ref Range   WBC 50.9 (HH) 4.0 - 10.5 K/uL    Comment: This critical result has verified and been called to Lynne Leader RN by Maia Breslow on Dec 09, 2020 at [place], and  has been read back.    RBC 3.10 (L) 3.87 - 5.11 MIL/uL   Hemoglobin 9.0 (L) 12.0 - 15.0 g/dL   HCT 29.8 (L) 36.0 - 46.0 %   MCV 96.1 80.0 - 100.0 fL   MCH 29.0 26.0 - 34.0 pg   MCHC 30.2 30.0 - 36.0 g/dL   RDW 18.6 (H) 11.5 - 15.5 %   Platelets 172 150 - 400 K/uL    Comment: REPEATED TO VERIFY   nRBC 4.0 (H) 0.0 - 0.2 %    Comment: Performed at [facility] [address], [place], [place] [address]  Magnesium     Status: None   Collection Time: 12-09-2020  4:57 AM  Result Value Ref Range   Magnesium 2.3 1.7 - 2.4 mg/dL    Comment: Performed at [facility] [address], [place], [place] [address]  Phosphorus     Status: Abnormal   Collection Time: 12-09-20  4:57 AM  Result Value Ref Range   Phosphorus 4.8 (H) 2.5 - 4.6 mg/dL    Comment: Performed at [facility] [address], [place], [place] [address]  Comprehensive metabolic panel     Status: Abnormal   Collection Time: 12/09/2020  4:57 AM  Result Value Ref Range   Sodium 130 (L) 135 - 145 mmol/L   Potassium 7.2 (HH) 3.5 - 5.1 mmol/L    Comment: CRITICAL RESULT CALLED TO, READ BACK BY AND VERIFIED WITH: J.DAWSON,RN 0911 2020/12/09 CLARK,S    Chloride 98 98 - 111 mmol/L   CO2 20 (L) 22 - 32 mmol/L   Glucose, Bld 222 (H) 70 - 99 mg/dL    Comment: Glucose reference range applies only to samples taken after fasting for at least 8 hours.   BUN 158 (H) 8 - 23 mg/dL   Creatinine, Ser 4.59 (H) 0.44 - 1.00 mg/dL   Calcium 8.2 (L) 8.9 - 10.3 mg/dL   Total Protein 5.3 (L) 6.5 - 8.1 g/dL   Albumin 1.5 (L) 3.5 - 5.0 g/dL   AST 86 (H) 15 - 41 U/L   ALT 32 0 - 44 U/L   Alkaline Phosphatase 93 38 - 126 U/L   Total Bilirubin 4.2 (H) 0.3 - 1.2 mg/dL   GFR, Estimated 9 (L) >60 mL/min    Comment: (NOTE) Calculated using the CKD-EPI Creatinine Equation (2021)    Anion gap 12 5 - 15    Comment: Performed at [facility] [address], [place], [place] [address]  Triglycerides     Status: Abnormal  Collection  Time: 12/07/2020  4:57 AM  Result Value Ref Range   Triglycerides 324 (H) <150 mg/dL    Comment: Performed at [facility] [address] [place], [place] [address]  Glucose, capillary     Status: Abnormal   Collection Time: Dec 07, 2020  7:31 AM  Result Value Ref Range   Glucose-Capillary 124 (H) 70 - 99 mg/dL    Comment: Glucose reference range applies only to samples taken after fasting for at least 8 hours.    Radiology/Results: DG Chest Port 1 View  Result Date: 11/20/2020 CLINICAL DATA:  ARDS EXAM: PORTABLE CHEST 1 VIEW COMPARISON:  11/18/2020 chest radiograph. FINDINGS: Endotracheal tube tip is 2.3 cm above the carina. Enteric tube terminates in the proximal stomach with side port in the lower thoracic esophagus. Left internal jugular central venous catheter terminates in the upper third of the SVC. Left PICC terminates in middle third of the SVC. Stable cardiomediastinal silhouette with mild cardiomegaly. No pneumothorax. No right pleural effusion. Probable small to moderate left pleural effusion. Extensive patchy opacities throughout both lungs, worsened at the lung bases. IMPRESSION: 1. Enteric tube terminates in the proximal stomach with side port in the lower thoracic esophagus, consider advancing 5 cm. 2. Well-positioned endotracheal tube and left PICC. 3. Extensive patchy opacities throughout both lungs, worsened at the lung bases, compatible with severe multifocal pneumonia/ARDS. 4. Probable small to moderate left pleural effusion. Electronically Signed   By: Ilona Sorrel M.D.   On: 11/20/2020 08:22    Anti-infectives: Anti-infectives (From admission, onward)    Start     Dose/Rate Route Frequency Ordered Stop   11/18/20 2100  cefTRIAXone (ROCEPHIN) 2 g in sodium chloride 0.9 % 100 mL IVPB        2 g 200 mL/hr over 30 Minutes Intravenous Every 24 hours 11/18/20 0830     11/18/20 1200  anidulafungin (ERAXIS) 100 mg in sodium chloride 0.9 % 100 mL IVPB  Status:  Discontinued         100 mg 78 mL/hr over 100 Minutes Intravenous Every 24 hours 11/17/20 1104 11/18/20 0827   11/17/20 2121  ceFEPIme (MAXIPIME) 2 g in sodium chloride 0.9 % 100 mL IVPB  Status:  Discontinued        2 g 200 mL/hr over 30 Minutes Intravenous Every 24 hours 11/16/20 2134 11/18/20 0829   11/17/20 1615  metroNIDAZOLE (FLAGYL) IVPB 500 mg        500 mg 100 mL/hr over 60 Minutes Intravenous Every 8 hours 11/17/20 0812     11/17/20 1430  anidulafungin (ERAXIS) 200 mg in sodium chloride 0.9 % 200 mL IVPB        200 mg 78 mL/hr over 200 Minutes Intravenous  Once 11/17/20 1424 11/17/20 1850   11/17/20 1200  anidulafungin (ERAXIS) 200 mg in sodium chloride 0.9 % 200 mL IVPB  Status:  Discontinued        200 mg 78 mL/hr over 200 Minutes Intravenous Every 24 hours 11/17/20 1104 11/17/20 1424   11/16/20 1000  ceFEPIme (MAXIPIME) 2 g in sodium chloride 0.9 % 100 mL IVPB  Status:  Discontinued        2 g 200 mL/hr over 30 Minutes Intravenous Every 12 hours 11/16/20 0746 11/16/20 2134   11/15/20 2000  vancomycin (VANCOCIN) IVPB 1000 mg/200 mL premix  Status:  Discontinued        1,000 mg 200 mL/hr over 60 Minutes Intravenous Every 24 hours 11/15/20 0827 11/16/20 0759  11/15/20 1800  ceFEPIme (MAXIPIME) 2 g in sodium chloride 0.9 % 100 mL IVPB  Status:  Discontinued        2 g 200 mL/hr over 30 Minutes Intravenous Every 24 hours 11/15/20 0827 11/16/20 0746   11/14/20 1945  ceFEPIme (MAXIPIME) 2 g in sodium chloride 0.9 % 100 mL IVPB  Status:  Discontinued        2 g 200 mL/hr over 30 Minutes Intravenous Every 12 hours 11/14/20 1853 11/15/20 0827   11/14/20 1945  vancomycin (VANCOCIN) IVPB 1000 mg/200 mL premix  Status:  Discontinued        1,000 mg 200 mL/hr over 60 Minutes Intravenous Every 24 hours 11/14/20 1855 11/15/20 0827   11/14/20 1615  cefTRIAXone (ROCEPHIN) 2 g in sodium chloride 0.9 % 100 mL IVPB  Status:  Discontinued        2 g 200 mL/hr over 30 Minutes Intravenous Every 24 hours 11/14/20  1521 11/14/20 1802   11/14/20 1615  metroNIDAZOLE (FLAGYL) IVPB 500 mg  Status:  Discontinued        500 mg 100 mL/hr over 60 Minutes Intravenous Every 8 hours 11/14/20 1521 11/17/20 0812   11/04/20 1245  cefTRIAXone (ROCEPHIN) 2 g in sodium chloride 0.9 % 100 mL IVPB        2 g 200 mL/hr over 30 Minutes Intravenous Every 24 hours 11/04/20 1148 11/06/20 1302   11/02/20 1000  cefTRIAXone (ROCEPHIN) 2 g in sodium chloride 0.9 % 100 mL IVPB  Status:  Discontinued        2 g 200 mL/hr over 30 Minutes Intravenous Every 24 hours 11/01/20 1257 11/02/20 0856   11/02/20 1000  doxycycline (VIBRAMYCIN) 100 mg in sodium chloride 0.9 % 250 mL IVPB  Status:  Discontinued        100 mg 125 mL/hr over 120 Minutes Intravenous Every 12 hours 11/01/20 1257 11/02/20 0856   11/02/20 1000  ceFEPIme (MAXIPIME) 2 g in sodium chloride 0.9 % 100 mL IVPB  Status:  Discontinued        2 g 200 mL/hr over 30 Minutes Intravenous Every 12 hours 11/02/20 0856 11/04/20 1148   11/01/20 0915  levofloxacin (LEVAQUIN) IVPB 750 mg  Status:  Discontinued        750 mg 100 mL/hr over 90 Minutes Intravenous Every 24 hours 11/01/20 0827 11/01/20 1257   11/01/20 0730  levofloxacin (LEVAQUIN) IVPB 500 mg  Status:  Discontinued        500 mg 100 mL/hr over 60 Minutes Intravenous Every 24 hours 11/01/20 0631 11/01/20 1257   10/17/2020 2100  ciprofloxacin (CIPRO) IVPB 400 mg        400 mg 200 mL/hr over 60 Minutes Intravenous Every 12 hours 10/06/2020 1258 09/28/2020 2148   10/09/2020 0600  ciprofloxacin (CIPRO) IVPB 400 mg        400 mg 200 mL/hr over 60 Minutes Intravenous On call to O.R. 09/30/2020 0547 10/23/2020 0847       Assessment/Plan: Problem List: Patient Active Problem List   Diagnosis Date Noted   ARDS (adult respiratory distress syndrome) (HCC)    Septic shock (HCC)    Pressure injury of skin 11/04/2020   Cardiac arrest (HCC)    Emesis    Respiratory failure (HCC)    SOB (shortness of breath)    Acute respiratory  failure with hypoxemia ([place])    Encounter for central line placement    S/P partial gastrectomy 10/13/2020   Type 2 diabetes mellitus  without complication ([place]) [id number]   Hyperlipidemia 03/01/2014   Focal seizures ([place]) 10/21/2013   TIA (transient ischemic attack) 10/21/2013   CHEST PAIN 01/24/2010   BRADYCARDIA 12/23/2009   Diabetes ([place]) 08/04/2008   HYPOTHYROIDISM 03/06/2007   OBESITY 03/06/2007   OBSTRUCTIVE SLEEP APNEA 03/06/2007   HYPERTENSION, BENIGN 03/06/2007   CORONARY ARTERY DISEASE 03/06/2007   ALLERGIC RHINITIS 03/06/2007   DEGENERATIVE JOINT DISEASE, GENERALIZED 03/06/2007   BACK PAIN, CHRONIC 03/06/2007   Now s/p large aspiration and code x 2.    Efferent limb obstruction.  If we can stabilize her, this will need to be revised.    TNA for severe protein calorie malnutrition and intolerance of enteric feeding.  Pressors weaning as tolerated.  Down to just norepi.  Still on ceftriaxone.   Stress dose steroids given.  Now off.  Continue keppra per neuro.  Has been weaned signfiicantly.    Likely poor prognosis given second cardiac arrest. Was making slow improvements prior to this event.   HD unlikely to improve survival.  No HD to be offered.  Appreciate palliative care and CCM care.  Current plan is DNR, no dialysis.   Plan is terminal extubation today.      LOS: 27 days   [facility] [phone] for weekday/non holidays Check [email or web address] for coverage night/weekend/holidays  Do not use SecureChat as it is not reliable for timely patient care.     12-13-2020 9:39 AM

## 2020-11-28 NOTE — Death Summary Note (Signed)
DEATH SUMMARY   Patient Details  Name: [name] MRN: [id number] DOB: 01-Jan-1946  Admission/Discharge Information   Admit Date:  11/02/20  Date of Death: Date of Death: 29-Nov-2020  Time of Death: Time of Death: 07/24/1837  Length of Stay: 08/02/22  Referring Physician: Harlan Stains, MD   Reason(s) for Hospitalization  Intramucosal adenocarcinoma of the stomach. DM Obesity OSA HTN CAD Degenerative joint disease. H/o TIA Hyperlipidemia  Diagnoses  Preliminary cause of death:  Aspiration pneumonia  Secondary Diagnoses (including complications and co-morbidities):  Active Problems:   S/P partial gastrectomy   Renal failure   hyperkalemia   Cardiac arrest (HCC)   Respiratory failure (HCC)   Acute respiratory failure with hypoxemia (HCC)   Pressure injury of skin   ARDS (adult respiratory distress syndrome) ([place])   Septic shock (HCC)  And same as above.  Brief Hospital Course (including significant findings, care, treatment, and services provided and events leading to death)  [name] is a 75 y.o. year old female who underwent robotic distal gastrectomy with billroth II reconstruction 11/02/2020.  She did well overnight and tolerated NGT removal with sips of clears.  She didn't have great urine output and creatinine bumped.  I continued her on IV fluids.  I held some of her antihypertensives initially.  FENa was sent and showed intravascular volume depletion so fluids were kept in place.  Given the rise in creatinine, and upper GI was performed that didn't show a leak and showed passage of contrast into the jejunum.  Creatinine started to fall.  She started to have some nausea and had a few episodes of emesis.  This was noted to be pink tinged and mucous-like.  She had an additional episode of "emesis" like this.    However, on the early AM 7/5, she had a code event in which she was pulseless and required intubation with around 6 minutes of CPR.  This event was  witnessed by a nurse and was immediately following a massive episode of brownish emesis.  She was placed on multiple pressors and required high vent setting initially.  There was a question of seizures and neurology saw her.  They placed her on IV keppra which continued and was weaned a bit over her hospital stay.  The nutrition team was able to get a coretrack into the jejunum past the anastamosis and she received tube feeds for a bit.  This was ramped up slowly.    Originally she had very depressed neuro status and there was concern for anoxic brain injury.  She eventually did improve her mental status and was extubated.  MRI was negative. Core track was not able to be readvanced into the jejunum and She required bipap at night post extubation.  She did get a CT scan that was negative for gastric distention or leak.  She received broad spectrum antibiotics.  She also saw speech who noted aspiration and she was kept NPO.    She threw up again and this was again brown and not consistent with the tube feeds she had been getting.  An NGT was placed to suck out the stomach contents to minimize risk of aspiration.  EGD done by Dr. Benson Norway showed tight stricture at efferent limb of gastrojejunostomy.  TNA was started.    She was transferred to stepdown.  She remained NPO.  She was noted to be mildly tachypneic but wasn't having labored breathing.  She was transferred back to the ICU when she was noted  to be lethargic with tachycardia and tachypneic.  She settled out without pressors.  She improved her mental status as well and was able to get OOB with help, although it took multiple to assist her.    I was planning on revising the [place] but was waiting for her to improve her respiratory status a bit first.  She was given 25% albumin boluses given her rising creatinine again and attempt to avoid significant volume.  Unfortunately, she aspirated again and required reintubation and pressors.  Her oxygenation was much  worse this time and she required a period of prone ventilation to get her sats up into the 90s.  She again was on three pressors.  This time, she required 10 min of CPR.  Her creatinine continued to rise post code and she was much more oliguric.    Given her second code and how weak she was, it was apparent that she would not do well with HD and had a very low chance of survival and getting back home.  The family agreed on DNR and not escalating care to include dialysis.  After several days of having family visit without any significant improvement, she was terminally extubated and allowed to expire.     Pertinent Labs and Studies  Significant Diagnostic Studies CT ABDOMEN PELVIS WO CONTRAST  Result Date: 11/07/2020 CLINICAL DATA:  Abdominal distension, assess gastric outlet EXAM: CT ABDOMEN AND PELVIS WITHOUT CONTRAST TECHNIQUE: Multidetector CT imaging of the abdomen and pelvis was performed following the standard protocol without IV contrast. Sagittal and coronal MPR images reconstructed from axial data set. Patient drank dilute oral contrast for exam COMPARISON:  12/12/2009 FINDINGS: Lower chest: Bibasilar infiltrates, atelectasis, and small pleural effusions Hepatobiliary: Distended gallbladder with dependent density which could represent vicarious excretion of contrast or tiny dependent calculi. Liver unremarkable. Pancreas: Atrophic pancreas without mass Spleen: Spleen unremarkable.  Small adjacent splenule. Adrenals/Urinary Tract: Adrenal glands normal. Peripelvic cyst mid LEFT kidney 4.0 x 3.1 cm increased in size since previous study. No additional renal mass or hydronephrosis. No ureteral calcification or dilatation. Bladder decompressed by Foley catheter. Stomach/Bowel: Nasogastric tube in stomach. Prior distal gastrectomy with gastrojejunostomy. Bowel wall thickening at anastomosis. Oral contrast however passes into the proximal jejunum. Filling defect likely small amount of food debris in  stomach. Remaining bowel loops unremarkable. Vascular/Lymphatic: Atherosclerotic calcifications aorta, iliac arteries, visceral arteries, coronary arteries. Aorta normal caliber. No adenopathy. Reproductive: Uterus surgically absent. Nonvisualization of ovaries. Other: Free fluid in pelvis, small amount. Scattered stranding along umbilicus. No free air or hernia. Musculoskeletal: Osseous demineralization. Chronic lytic lesion L3 vertebral body unchanged since 2011 question vertebral hemangioma. IMPRESSION: Post distal gastrectomy with gastrojejunostomy anastomosis in LEFT upper quadrant. Bowel wall thickening at the gastrojejunostomy anastomosis though no evidence of outlet obstruction is identified; this could represent sequela of surgery, edema, or peri anastomotic ulcer, recommend correlation with endoscopy. Small amount of nonspecific free pelvic fluid. Increased size of peripelvic cyst mid LEFT kidney. Bibasilar pulmonary infiltrates, atelectasis and small pleural effusions. Cannot exclude tiny dependent calculi within gallbladder. Aortic Atherosclerosis (ICD10-I70.0). Electronically Signed   By: Lavonia Dana M.D.   On: 11/07/2020 18:42   DG Chest 1 View  Result Date: 11/07/2020 CLINICAL DATA:  Endotracheal intubation EXAM: CHEST  1 VIEW COMPARISON:  11/04/2020 FINDINGS: There has been interval retraction of enteric feeding tube, tip now positioned near the gastroesophageal junction. Support apparatus is otherwise unchanged. Unchanged diffuse interstitial heterogeneous airspace opacity throughout the lungs and retrocardiac atelectasis or consolidation. Cardiomegaly.  IMPRESSION: 1. There has been interval retraction of enteric feeding tube, tip now positioned near the gastroesophageal junction. Recommend advancement. Support apparatus is otherwise unchanged. 2.  Otherwise unchanged AP portable examination. Electronically Signed   By: Eddie Candle M.D.   On: 11/07/2020 08:28   DG Abd 1 View  Result Date:  11/18/2020 CLINICAL DATA:  ARDS.  Ileus. EXAM: ABDOMEN - 1 VIEW COMPARISON:  One-view abdomen 10/11/2020 one-view chest x-ray 11/18/2020 FINDINGS: NG tube demonstrated on the chest x-ray is not imaged on this film. Relatively gasless abdomen noted. No bowel distension present. No evidence for free air. IMPRESSION: Relatively gasless abdomen without evidence for obstruction or free air. Electronically Signed   By: San Morelle M.D.   On: 11/18/2020 11:30   DG Abd 1 View  Result Date: 11/10/2020 CLINICAL DATA:  Status post NG and feeding tube placement. EXAM: ABDOMEN - 1 VIEW COMPARISON:  Single-view of the abdomen 11/09/2020. FINDINGS: NG tube feeding tube are both within the stomach with their tips directed distally. Side port of the NG tube is also in the stomach. IMPRESSION: As above. Electronically Signed   By: Inge Rise M.D.   On: 11/10/2020 11:43   DG Abd 1 View  Result Date: 11/09/2020 CLINICAL DATA:  NG tube placement. EXAM: ABDOMEN - 1 VIEW COMPARISON:  CT November 07, 2020. FINDINGS: Enteric feeding catheter coursing below the diaphragm with tip in the mid left hemiabdomen, which given the altered anatomy from prior distal gastrectomy seen on recent CT is likely post pylorus. The bowel gas pattern is normal. Small left pleural effusion with atelectasis. IMPRESSION: Enteric feeding catheter coursing below the diaphragm with tip in the mid left hemiabdomen, which given the altered anatomy from prior distal gastrectomy is likely post pyloric. Electronically Signed   By: Dahlia Bailiff MD   On: 11/09/2020 16:02   DG Abd 1 View  Result Date: 11/08/2020 CLINICAL DATA:  Post gastrectomy. Cortrak advancement under fluoroscopic guidance. EXAM: DG NASO G TUBE PLC W/FL-NO RAD; ABDOMEN - 1 VIEW CONTRAST:  10 mL Omnipaque 300 injected through the tube. FLUOROSCOPY TIME:  Fluoroscopy Time:  42 seconds Radiation Exposure Index (if provided by the fluoroscopic device): Number of Acquired Spot Images:  0 COMPARISON:  None. FINDINGS: The Cortrak tube was advanced under fluoroscopic guidance through the stomach and into the proximal small bowel. Contrast was injected to verify placement. IMPRESSION: Advancement of the Cortrak tube into the proximal small bowel. Electronically Signed   By: Rolm Baptise M.D.   On: 11/08/2020 11:14   MR BRAIN WO CONTRAST  Result Date: 11/05/2020 CLINICAL DATA:  Mental status change. EXAM: MRI HEAD WITHOUT CONTRAST TECHNIQUE: Multiplanar, multiecho pulse sequences of the brain and surrounding structures were obtained without intravenous contrast. COMPARISON:  MRI October 22, 2013. FINDINGS: Brain: No acute infarction, hemorrhage, hydrocephalus, extra-axial collection or mass lesion. Mild for age T2/FLAIR hyperintensities within the white matter, most likely related to chronic microvascular ischemic disease. Small dilated perivascular spaces in bilateral basal ganglia. Vascular: Major arterial flow voids are maintained skull base. Skull and upper cervical spine: Normal marrow signal. Hyperostosis frontalis. Sinuses/Orbits: Moderate sphenoid sinus and ethmoid air cell mucosal thickening. Fluid layering in the pharynx. Other: Moderate bilateral mastoid effusions. IMPRESSION: 1. No evidence of acute intracranial abnormality. 2. Mild for age chronic microvascular ischemic disease. 3. Moderate bilateral mastoid effusions and paranasal sinus disease, potentially related to the patient's intubated status. Electronically Signed   By: Margaretha Sheffield MD   On: 11/05/2020 15:13  DG Chest Port 1 View  Result Date: 11/20/2020 CLINICAL DATA:  ARDS EXAM: PORTABLE CHEST 1 VIEW COMPARISON:  11/18/2020 chest radiograph. FINDINGS: Endotracheal tube tip is 2.3 cm above the carina. Enteric tube terminates in the proximal stomach with side port in the lower thoracic esophagus. Left internal jugular central venous catheter terminates in the upper third of the SVC. Left PICC terminates in middle third of  the SVC. Stable cardiomediastinal silhouette with mild cardiomegaly. No pneumothorax. No right pleural effusion. Probable small to moderate left pleural effusion. Extensive patchy opacities throughout both lungs, worsened at the lung bases. IMPRESSION: 1. Enteric tube terminates in the proximal stomach with side port in the lower thoracic esophagus, consider advancing 5 cm. 2. Well-positioned endotracheal tube and left PICC. 3. Extensive patchy opacities throughout both lungs, worsened at the lung bases, compatible with severe multifocal pneumonia/ARDS. 4. Probable small to moderate left pleural effusion. Electronically Signed   By: Ilona Sorrel M.D.   On: 11/20/2020 08:22   DG Chest Port 1 View  Result Date: 11/18/2020 CLINICAL DATA:  ARDS, ileus EXAM: PORTABLE CHEST 1 VIEW COMPARISON:  Portable exam 1102 hours compared to 1023 hours FINDINGS: Tip of endotracheal tube projects 1.6 cm above carina. Nasogastric tube extends into stomach. LEFT jugular line with tip projecting over SVC. Stable heart size and mediastinal contours. Atherosclerotic calcification aorta. BILATERAL pulmonary infiltrates unchanged. No pleural effusion or pneumothorax. Bones demineralized with prior cervicothoracic fusion. IMPRESSION: Persistent pulmonary infiltrates. Aortic Atherosclerosis (ICD10-I70.0). Electronically Signed   By: Lavonia Dana M.D.   On: 11/18/2020 14:05   Portable Chest xray  Result Date: 11/17/2020 CLINICAL DATA:  Check endotracheal tube, hypoxia EXAM: PORTABLE CHEST 1 VIEW COMPARISON:  11/16/2020 FINDINGS: Cardiac shadow is stable. Left-sided PICC line, left jugular central line and endotracheal tube are all noted in satisfactory position. Gastric catheter extends to the stomach. Patchy airspace disease is noted bilaterally but improved slightly in the interval from the prior exam. No bony abnormality is noted. IMPRESSION: Slight improved aeration when compare with the previous day. Electronically Signed   By: Inez Catalina M.D.   On: 11/17/2020 12:02   DG Chest Port 1 View  Result Date: 11/16/2020 CLINICAL DATA:  Endotracheal tube positioning while patient is prone. ARDS. EXAM: PORTABLE CHEST 1 VIEW COMPARISON:  Earlier today. FINDINGS: Magdalene Molly is difficult to define, the tip of the enteric tube is just beyond the level of the clavicular heads. Enteric tube in place with tip below the diaphragm. Left upper extremity PICC and left internal jugular central line remain in place. Worsening diffuse airspace disease throughout the right hemithorax. Patchy airspace disease on the left is similar or worsening. Grossly stable heart size and mediastinal contours, partially obscured by perihilar opacities. IMPRESSION: 1. The tip of the enteric tube is just beyond the level of the clavicular heads. Other support apparatus is stable in position. 2. Worsening diffuse airspace disease throughout the right hemithorax. Patchy airspace disease on the left is similar or worsening. Electronically Signed   By: Keith Rake M.D.   On: 11/16/2020 20:48   DG Chest Port 1 View  Result Date: 11/16/2020 CLINICAL DATA:  Central line placement. EXAM: PORTABLE CHEST 1 VIEW COMPARISON:  Prior chest x-ray same day.  Chest x-ray 11/15/2020. FINDINGS: Interim placement of left IJ line, tip over SVC. Left PICC line in stable position. Endotracheal tube tip 1 cm above the carina. Proximal repositioning of approximately 2 cm should be considered. Stable cardiomegaly. Severe bilateral multifocal pulmonary infiltrates/edema particularly  throughout the right lung and left base. No pleural effusion or pneumothorax. Prior cervical spine fusion. IMPRESSION: 1. Interim placement of left IJ line, tip over SVC. Left PICC line stable position. 2. Endotracheal tube tip 1 cm above the. Proximal repositioning approximately 2 cm should be considered. 3.  Stable cardiomegaly. 4. Severe bilateral multifocal pulmonary infiltrates/edema 50 coli throughout the right lung  and left base. Electronically Signed   By: Marcello Moores  Register   On: 11/16/2020 13:19   DG CHEST PORT 1 VIEW  Result Date: 11/16/2020 CLINICAL DATA:  Ventilator dependence. EXAM: PORTABLE CHEST 1 VIEW COMPARISON:  11/15/2020 FINDINGS: 1152 hours. Endotracheal tube tip is approximately 1.1 cm above the base of the carina. Left PICC line tip overlies the mid to distal SVC level. NG tube has been removed in the interval. Interval development of patchy airspace disease in the right upper and lower lung with persistent retrocardiac left base collapse/consolidation. Interstitial markings are diffusely coarsened with chronic features. The cardio pericardial silhouette is enlarged. Bones are diffusely demineralized. Telemetry leads overlie the chest. IMPRESSION: 1. Interval development of patchy airspace disease in the right upper and lower lung compatible with pneumonia or aspiration. 2. Persistent retrocardiac left base collapse/consolidation. 3. Diffuse interstitial opacity, likely chronic although interstitial edema not entirely excluded. 4. Interval removal of NG tube. Electronically Signed   By: Misty Stanley M.D.   On: 11/16/2020 12:16   DG CHEST PORT 1 VIEW  Result Date: 11/15/2020 CLINICAL DATA:  Shortness of breath. Recent gastrostomy. History of respiratory failure. EXAM: PORTABLE CHEST 1 VIEW COMPARISON:  11/14/2020. FINDINGS: Interim removal of feeding tube. NG tube and left PICC line in stable position. Severe cardiomegaly again noted. Mild bilateral increase interstitial prominence noted suggesting interstitial edema. Pneumonitis cannot be excluded. Mild atelectatic changes right mid lung again noted. Small left pleural effusion noted on today's exam. No pneumothorax. Prior cervical spine fusion. IMPRESSION: 1. Interim removal of feeding tube. NG tube and left PICC line stable position. 2. Severe cardiomegaly again noted. Mild bilateral increase interstitial prominence noted suggesting interstitial  edema. Pneumonitis cannot be excluded. Small left pleural effusion noted on today's exam Electronically Signed   By: Marcello Moores  Register   On: 11/15/2020 06:24   DG CHEST PORT 1 VIEW  Result Date: 11/14/2020 CLINICAL DATA:  Leukocytosis, history CHF, hypertension, stroke, on BiPAP EXAM: PORTABLE CHEST 1 VIEW COMPARISON:  Portable exam 0915 hours compared to 11/12/2020 FINDINGS: Nasogastric tube and feeding tube extends into stomach. Enlargement of cardiac silhouette with pulmonary vascular congestion. Atherosclerotic calcification aorta. Mild pulmonary infiltrates likely reflecting pulmonary edema. Subsegmental atelectasis RIGHT upper lobe. No pleural effusion or pneumothorax. Bones demineralized with evidence of prior cervical spine fusion. IMPRESSION: Enlargement of cardiac silhouette with pulmonary vascular congestion and minimal pulmonary edema. Subsegmental atelectasis RIGHT upper lobe unchanged. Aortic Atherosclerosis (ICD10-I70.0). Electronically Signed   By: Lavonia Dana M.D.   On: 11/14/2020 11:21   DG Chest Port 1 View  Result Date: 11/20/2020 CLINICAL DATA:  Post EGD EXAM: PORTABLE CHEST 1 VIEW COMPARISON:  11/07/2020 FINDINGS: NG tube in the stomach. Feeding tube enters the stomach with the tip not visualized. Endotracheal tube removed since the prior study. Improvement in right lower lobe airspace disease. No change in left lower lobe airspace disease. Small left effusion is present. Cardiac enlargement without heart failure or edema. IMPRESSION: Improvement in right lower lobe airspace disease. No change in left lower lobe airspace disease and small left effusion Interval removal of endotracheal tube. Electronically Signed  By: Franchot Gallo M.D.   On: 11/16/2020 15:13   DG Chest Port 1 View  Result Date: 11/04/2020 CLINICAL DATA:  Acute respiratory failure with hypoxemia. EXAM: PORTABLE CHEST 1 VIEW COMPARISON:  November 02, 2020. FINDINGS: Stable cardiomegaly. Endotracheal and nasogastric tubes  are unchanged in position. Left internal jugular catheter is unchanged. No pneumothorax is noted. Bilateral lung opacities are again noted concerning for pneumonia or edema. Left pleural effusion may be present. Bony thorax is unremarkable. IMPRESSION: Stable support apparatus.  Stable bilateral lung opacities. Aortic Atherosclerosis (ICD10-I70.0). Electronically Signed   By: Marijo Conception M.D.   On: 11/04/2020 09:06   Portable Chest xray  Result Date: 11/02/2020 CLINICAL DATA:  Intubation.  Respiratory failure. EXAM: PORTABLE CHEST 1 VIEW COMPARISON:  11/01/2020. FINDINGS: Endotracheal tube, NG tube, left IJ line in stable position. Stable cardiomegaly. Prominent dense bilateral pulmonary infiltrates/edema again noted without interim change. Left pleural effusion cannot be excluded. No pneumothorax. IMPRESSION: 1.  Lines and tubes stable position 2.  Stable cardiomegaly. 3. Prominent dense bilateral pulmonary infiltrates/edema again noted without interim change. Left pleural effusion cannot be excluded. Electronically Signed   By: Marcello Moores  Register   On: 11/02/2020 07:26   DG CHEST PORT 1 VIEW  Result Date: 11/01/2020 CLINICAL DATA:  Left central line placement EXAM: PORTABLE CHEST 1 VIEW COMPARISON:  11/01/2020, 7:46 a.m. FINDINGS: Interval placement of left neck vascular catheter, tip projecting near the superior cavoatrial junction. Remaining support apparatus including endotracheal tube and esophagogastric tube are unchanged. Cardiomegaly with unchanged diffuse bilateral interstitial and heterogeneous airspace opacity. IMPRESSION: 1. Interval placement of left neck vascular catheter, tip projecting near the superior cavoatrial junction. Otherwise unchanged support apparatus. 2. Cardiomegaly with unchanged diffuse bilateral interstitial and heterogeneous airspace opacity. Electronically Signed   By: Eddie Candle M.D.   On: 11/01/2020 10:11   DG CHEST PORT 1 VIEW  Result Date: 11/01/2020 CLINICAL DATA:   Intubation. EXAM: PORTABLE CHEST 1 VIEW COMPARISON:  11/01/2020. FINDINGS: Endotracheal tube noted with tip noted 2 cm above the carina. NG tube noted tip below left hemidiaphragm. Stable cardiomegaly. Low lung volumes with progression of bilateral pulmonary infiltrates/edema. Small left pleural effusion again noted. No pneumothorax. Prior cervical spine fusion. IMPRESSION: 1. Endotracheal tube noted with tip 2 cm above the carina on this exam. NG tube tip below left hemidiaphragm. 2.  Stable cardiomegaly. 3. Low lung volumes with progression of bilateral pulmonary infiltrates/edema. Small left pleural effusion again noted. Electronically Signed   By: Marcello Moores  Register   On: 11/01/2020 07:56   DG CHEST PORT 1 VIEW  Result Date: 11/01/2020 CLINICAL DATA:  Intubation and OG tube placement.  Cardiac arrest. EXAM: PORTABLE CHEST 1 VIEW COMPARISON:  10/28/2020. FINDINGS: Endotracheal tube noted with tip 1 cm above the carina. Proximal repositioning of approximately 2 cm should be considered. NG tube noted with tip below left hemidiaphragm. Stable cardiomegaly. No pulmonary venous congestion. Low lung volumes with bibasilar atelectasis/infiltrates. Small left pleural effusion cannot be excluded. No pneumothorax. Prior cervical spine fusion. IMPRESSION: 1. Endotracheal tube noted with tip 1 cm above the carina. Proximal repositioning of approximately 2 cm should be considered. NG tube noted with tip below left hemidiaphragm. 2.  Stable cardiomegaly.  No pulmonary venous congestion. 3. Low lung volumes with bibasilar atelectasis/infiltrates. Small left pleural effusion cannot be excluded. Electronically Signed   By: Marcello Moores  Register   On: 11/01/2020 06:21   DG CHEST PORT 1 VIEW  Result Date: 10/29/2020 CLINICAL DATA:  Shortness of breath  EXAM: PORTABLE CHEST 1 VIEW COMPARISON:  11/30/2009 FINDINGS: Cardiac enlargement. No vascular congestion, edema, or consolidation. No pleural effusions. No pneumothorax. Calcified and  tortuous aorta. Postoperative changes in the cervical spine. IMPRESSION: Cardiac enlargement.  No evidence of active pulmonary disease. Electronically Signed   By: Lucienne Capers M.D.   On: 10/29/2020 00:10   DG Abd Portable 1V  Result Date: 11/07/2020 CLINICAL DATA:  Enteric tube placement EXAM: PORTABLE ABDOMEN - 1 VIEW COMPARISON:  11/07/2020 FINDINGS: Feeding tube is seen coursing below diaphragm with distal tip coiled within the gastric body. Previously seen NG tube is no longer evident. There are overlying cardiac leads. Patchy opacities within the visualized lung fields. Cardiomegaly. IMPRESSION: Feeding tube coiled within the gastric body. Electronically Signed   By: Davina Poke D.O.   On: 11/07/2020 14:52   DG Abd Portable 1V  Result Date: 11/07/2020 CLINICAL DATA:  NG tube placement. EXAM: PORTABLE ABDOMEN - 1 VIEW COMPARISON:  11/01/2020 FINDINGS: NG tube tip is in the mid stomach. The feeding tube is coiled in the proximal stomach. Bowel gas pattern is nonspecific with no evidence to suggest bowel obstruction. Degenerative changes noted lower lumbar spine. IMPRESSION: NG tube tip is in the mid stomach. Feeding tube is coiled in the proximal stomach. Electronically Signed   By: Misty Stanley M.D.   On: 11/07/2020 10:52   DG Abd Portable 1V  Result Date: 11/01/2020 CLINICAL DATA:  Feeding tube placement EXAM: PORTABLE ABDOMEN - 1 VIEW COMPARISON:  Radiograph 11/01/2020, UGI 10/27/2020 FINDINGS: NG tube tip projects over left upper quadrant. Feeding tube tip over the mid abdomen, small amount of enteral contrast opacifies jejunum. The patient is status post gastrectomy with gastrojejunostomy. The abdomen is relatively gasless. IMPRESSION: 1. Feeding tube tip within the jejunum. Status post gastrectomy with gastrojejunostomy 2. NG tube tip overlies the gastric remnant region. Electronically Signed   By: Donavan Foil M.D.   On: 11/01/2020 16:55   DG Abd Portable 1V  Result Date:  11/01/2020 CLINICAL DATA:  Feeding tube placement EXAM: PORTABLE ABDOMEN - 1 VIEW COMPARISON:  November 01, 2020. FINDINGS: A gastric tube terminates in the proximal stomach, side port below the expected location of the GE junction. A feeding tube is present, tip likely within the loop jejunostomy based on course and the appearance of the previous upper GI evaluation. EKG leads project over the abdomen with pacer defibrillator pads in place as well Basilar airspace disease partially visualized at the lung bases. On limited assessment no acute regional skeletal process. IMPRESSION: 1. A gastric tube terminates in the proximal stomach, side port below the expected location of the GE junction. 2. Feeding tube tip likely within the loop jejunostomy, beyond the stomach. Electronically Signed   By: Zetta Bills M.D.   On: 11/01/2020 14:45   DG Abd Portable 1V  Result Date: 11/01/2020 CLINICAL DATA:  Intubation EXAM: PORTABLE ABDOMEN - 1 VIEW COMPARISON:  Four days ago FINDINGS: The enteric tube tip is at the stomach. The imaged abdomen shows a nonobstructive bowel gas pattern. Artifact from EKG leads and defibrillator pads. The chest is reported on dedicated film performed at the same time. IMPRESSION: Enteric tube with tip at the stomach. Electronically Signed   By: Monte Fantasia M.D.   On: 11/01/2020 06:22   DG Abd Portable 1V  Result Date: 10/28/2020 CLINICAL DATA:  Emesis EXAM: PORTABLE ABDOMEN - 1 VIEW COMPARISON:  10/27/2020 FINDINGS: Persistent contrast material is noted within the stomach and proximal small bowel  similar to that seen on recent upper GI. The degree of small-bowel dilatation is stable. No significant progression of contrast material distally is noted. IMPRESSION: Retained contrast material from previous upper GI within the stomach and proximal small bowel. Mild small bowel dilatation is noted and no significant progression of contrast distally is seen. Electronically Signed   By: Inez Catalina  M.D.   On: 10/28/2020 15:58   DG Abd Portable 1V  Result Date: 10/07/2020 CLINICAL DATA:  Check gastric catheter placement EXAM: PORTABLE ABDOMEN - 1 VIEW COMPARISON:  None. FINDINGS: Gastric catheter is noted within the stomach. Scattered large and small bowel gas is noted. No obstructive changes are seen. IMPRESSION: Gastric catheter within the stomach. Electronically Signed   By: Inez Catalina M.D.   On: 10/21/2020 15:07   DG Naso G Tube Plc W/Fl-No Rad  Result Date: 11/08/2020 CLINICAL DATA:  Post gastrectomy. Cortrak advancement under fluoroscopic guidance. EXAM: DG NASO G TUBE PLC W/FL-NO RAD; ABDOMEN - 1 VIEW CONTRAST:  10 mL Omnipaque 300 injected through the tube. FLUOROSCOPY TIME:  Fluoroscopy Time:  42 seconds Radiation Exposure Index (if provided by the fluoroscopic device): Number of Acquired Spot Images: 0 COMPARISON:  None. FINDINGS: The Cortrak tube was advanced under fluoroscopic guidance through the stomach and into the proximal small bowel. Contrast was injected to verify placement. IMPRESSION: Advancement of the Cortrak tube into the proximal small bowel. Electronically Signed   By: Rolm Baptise M.D.   On: 11/08/2020 11:14   DG UGI W SINGLE CM (SOL OR THIN BA)  Result Date: 10/27/2020 CLINICAL DATA:  Distal gastrectomy with loop gastrojejunostomy, evaluate for leak EXAM: WATER SOLUBLE UPPER GI SERIES TECHNIQUE: Single-column upper GI series was performed using water soluble contrast. CONTRAST:  163m OMNIPAQUE IOHEXOL 300 MG/ML  SOLN COMPARISON:  Abdominal radiograph 10/27/2020 FLUOROSCOPY TIME:  Fluoroscopy Time:  1 minutes 42 seconds Radiation Exposure Index (if provided by the fluoroscopic device): 53.1 mGy Number of Acquired Spot Images: 5 FINDINGS: Initial scout radiograph demonstrates postsurgical changes in the left upper quadrant and left basilar subsegmental atelectasis. There is a nonobstructive bowel gas pattern. The patient was instructed to swallow 100 mL of Omnipaque 300  water-soluble contrast, which was done without difficulty. Contrast progressed into the stomach without difficulty. Upper GI anatomy is consistent with distal gastrectomy with gastrojejunostomy. There is no evidence of extraluminal contrast extravasation. Contrast progresses through the efferent limb/proximal jejunum. There is no evidence of reflux into the afferent limb. IMPRESSION: Postsurgical changes of distal gastrectomy with loop gastrojejunostomy. No evidence of contrast leak. Electronically Signed   By: JMaurine Simmering  On: 10/27/2020 14:23   EEG adult  Result Date: 11/02/2020 YLora Havens MD     11/02/2020 11:43 AM Patient Name: CTONETTA EASTMRN: [id number] Attending: PLora HavensReferring Physician/Provider: Dr. DSimonne MaffucciDate: 11/02/2020 Duration: 23.15 minutes Patient history: 75year old female status post cardiorespiratory arrest.  Noted to have right arm twitching today.  EEG evaluate for seizures. Level of alertness: comatose AEDs during EEG study: Keppra, Versed Technical aspects: This EEG study was done with scalp electrodes positioned according to the 10-20 International system of electrode placement. Electrical activity was acquired at a sampling rate of '500Hz'$  and reviewed with a high frequency filter of '70Hz'$  and a low frequency filter of '1Hz'$ . EEG data were recorded continuously and digitally stored. Description: EEG showed near continuous generalized 3 to 5 Hz theta-delta slowing admixed with intermittent generalized 15 to 18 Hz beta activity.  Hyperventilation and photic stimulation were not performed.   ABNORMALITY - Continuous slow, generalized IMPRESSION: This study is suggestive of severe diffuse encephalopathy, nonspecific etiology but could be related to sedation, toxic-metabolic etiology, anoxic/hypoxic brain injury. No seizures or epileptiform discharges were seen throughout the recording. Priyanka [name]   Overnight EEG with video  Result Date:  11/08/2020 Lora Havens, MD     11/08/2020 12:47 PM Patient Name: LAIYLA MERRIS MRN: [id number] Epilepsy Attending: Lora Havens Referring Physician/Provider: Dr. Ina Homes Duration: 11/07/2020 0910 11/08/2020 [id number] Patient history: 75 year old female status post in-hospital cardiac arrest on 7/6/202, ROSC achieved in about 6 minutes.  Now noted to have intermittent involuntary movements.  EEG to evaluate for seizures Level of alertness: Awake, asleep AEDs during EEG study: LEV Technical aspects: This EEG study was done with scalp electrodes positioned according to the 10-20 International system of electrode placement. Electrical activity was acquired at a sampling rate of '500Hz'$  and reviewed with a high frequency filter of '70Hz'$  and a low frequency filter of '1Hz'$ . EEG data were recorded continuously and digitally stored. Description: During awake state, no clear posterior dominant rhythm was seen. Sleep was characterized by sleep spindles (12 to 14 Hz), maximal frontocentral region. EEG showed continuous generalized 3 to 5 Hz theta-delta slowing.  Hyperventilation and photic stimulation were not performed.   ABNORMALITY - Continuous slow, generalized IMPRESSION: This study is suggestive of moderate diffuse encephalopathy, nonspecific etiology. No seizures or epileptiform discharges were seen throughout the recording. Lora Havens   ECHOCARDIOGRAM COMPLETE  Result Date: 11/02/2020    ECHOCARDIOGRAM REPORT   Patient Name:   MARGURIETE NEGASH Date of Exam: 11/02/2020 Medical Rec #:  [id number]            Height:       59.0 in Accession #:    [id number]           Weight:       226.6 lb Date of Birth:  02/14/1946            BSA:          1.945 m Patient Age:    81 years             BP:           136/43 mmHg Patient Gender: F                    HR:           75 bpm. Exam Location:  Inpatient Procedure: 2D Echo, Cardiac Doppler, Color Doppler and Intracardiac            Opacification Agent Indications:     Acute ischemic heart disease, unspecified I24.9  History:        Patient has prior history of Echocardiogram examinations, most                 recent 08/18/2020. CAD; Risk Factors:Diabetes, Hypertension and                 Sleep Apnea.  Sonographer:    Darlina Sicilian RDCS Referring Phys: [place]  Sonographer Comments: Echo performed with patient supine and on artificial respirator. IMPRESSIONS  1. Left ventricular ejection fraction, by estimation, is 65 to 70%. The left ventricle has normal function. The left ventricle has no regional wall motion abnormalities. Left ventricular diastolic parameters are consistent with Grade II diastolic dysfunction (pseudonormalization).  2. Right ventricular systolic function was  not well visualized. The right ventricular size is not well visualized. There is moderately elevated pulmonary artery systolic pressure. The estimated right ventricular systolic pressure is Q000111Q mmHg.  3. The mitral valve is grossly normal. Trivial mitral valve regurgitation. No evidence of mitral stenosis.  4. The aortic valve is tricuspid. There is mild calcification of the aortic valve. Aortic valve regurgitation is trivial. Mild aortic valve sclerosis is present, with no evidence of aortic valve stenosis. Comparison(s): No significant change from prior study. FINDINGS  Left Ventricle: Left ventricular ejection fraction, by estimation, is 65 to 70%. The left ventricle has normal function. The left ventricle has no regional wall motion abnormalities. Definity contrast agent was given IV to delineate the left ventricular  endocardial borders. The left ventricular internal cavity size was normal in size. There is no left ventricular hypertrophy. Left ventricular diastolic parameters are consistent with Grade II diastolic dysfunction (pseudonormalization). Right Ventricle: The right ventricular size is not well visualized. Right vetricular wall thickness was not well visualized. Right  ventricular systolic function was not well visualized. There is moderately elevated pulmonary artery systolic pressure. The  tricuspid regurgitant velocity is 3.54 m/s, and with an assumed right atrial pressure of 8 mmHg, the estimated right ventricular systolic pressure is Q000111Q mmHg. Left Atrium: Left atrial size was normal in size. Right Atrium: Right atrial size was not well visualized. Pericardium: There is no evidence of pericardial effusion. Presence of pericardial fat pad. Mitral Valve: The mitral valve is grossly normal. Trivial mitral valve regurgitation. No evidence of mitral valve stenosis. Tricuspid Valve: The tricuspid valve is normal in structure. Tricuspid valve regurgitation is trivial. No evidence of tricuspid stenosis. Aortic Valve: The aortic valve is tricuspid. There is mild calcification of the aortic valve. Aortic valve regurgitation is trivial. Mild aortic valve sclerosis is present, with no evidence of aortic valve stenosis. Pulmonic Valve: The pulmonic valve was not well visualized. Pulmonic valve regurgitation is trivial. No evidence of pulmonic stenosis. Aorta: The aortic root and ascending aorta are structurally normal, with no evidence of dilitation. Venous: IVC assessment for right atrial pressure unable to be performed due to mechanical ventilation. IAS/Shunts: The interatrial septum was not well visualized.  LEFT VENTRICLE PLAX 2D LVIDd:         4.40 cm     Diastology LVIDs:         2.40 cm     LV e' medial:    6.65 cm/s LV PW:         0.90 cm     LV E/e' medial:  13.9 LV IVS:        1.00 cm     LV e' lateral:   4.87 cm/s LVOT diam:     1.70 cm     LV E/e' lateral: 19.0 LV SV:         37 LV SV Index:   19 LVOT Area:     2.27 cm  LV Volumes (MOD) LV vol d, MOD A2C: 72.7 ml LV vol d, MOD A4C: 81.3 ml LV vol s, MOD A2C: 29.8 ml LV vol s, MOD A4C: 15.8 ml LV SV MOD A2C:     42.9 ml LV SV MOD A4C:     81.3 ml LV SV MOD BP:      55.4 ml LEFT ATRIUM           Index LA diam:      3.30 cm  1.70 cm/m LA Vol (A4C): 54.5 ml 28.02 ml/m  AORTIC VALVE LVOT Vmax:   86.70 cm/s LVOT Vmean:  51.500 cm/s LVOT VTI:    0.163 m  AORTA Ao Root diam: 2.80 cm Ao Asc diam:  3.10 cm MITRAL VALVE               TRICUSPID VALVE MV Area (PHT): 4.04 cm    TR Peak grad:   50.1 mmHg MV Decel Time: 188 msec    TR Vmax:        354.00 cm/s MV E velocity: 92.50 cm/s MV A velocity: 93.90 cm/s  SHUNTS MV E/A ratio:  0.99        Systemic VTI:  0.16 m                            Systemic Diam: 1.70 cm Buford Dresser MD Electronically signed by Buford Dresser MD Signature Date/Time: 11/02/2020/2:20:44 PM    Final    [place] EKG SITE RITE  Result Date: 11/13/2020 If Site Rite image not attached, placement could not be confirmed due to current cardiac rhythm.  [place] EKG SITE RITE  Result Date: 11/09/2020 If Site Rite image not attached, placement could not be confirmed due to current cardiac rhythm.   Microbiology Recent Results (from the past 240 hour(s))  Culture, blood (routine x 2)     Status: None   Collection Time: 11/14/20  9:36 AM   Specimen: BLOOD  Result Value Ref Range Status   Specimen Description BLOOD RIGHT ANTECUBITAL  Final   Special Requests   Final    BOTTLES DRAWN AEROBIC AND ANAEROBIC Blood Culture results may not be optimal due to an excessive volume of blood received in culture bottles   Culture   Final    NO GROWTH 5 DAYS Performed at [facility], [place] [address], [place], [place] [address]    Report Status 11/19/2020 FINAL  Final  Culture, blood (routine x 2)     Status: None   Collection Time: 11/14/20  9:36 AM   Specimen: BLOOD RIGHT HAND  Result Value Ref Range Status   Specimen Description BLOOD RIGHT HAND  Final   Special Requests   Final    BOTTLES DRAWN AEROBIC AND ANAEROBIC Blood Culture results may not be optimal due to an excessive volume of blood received in culture bottles   Culture   Final    NO GROWTH 5 DAYS Performed at [facility], [place]  [address] [place], [place] [address]    Report Status 11/19/2020 FINAL  Final  Urine Culture     Status: Abnormal   Collection Time: 11/14/20  6:54 PM   Specimen: Urine, Clean Catch  Result Value Ref Range Status   Specimen Description URINE, CLEAN CATCH  Final   Special Requests   Final    NONE Performed at [facility], [place] [address] [place], [place] [address]    Culture 20,000 COLONIES/mL YEAST (A)  Final   Report Status 11/16/2020 FINAL  Final  MRSA Next Gen by PCR, Nasal     Status: None   Collection Time: 11/15/20  5:24 PM   Specimen: Nasal Mucosa; Nasal Swab  Result Value Ref Range Status   MRSA by PCR Next Gen NOT DETECTED NOT DETECTED Final    Comment: (NOTE) The GeneXpert MRSA Assay (FDA approved for NASAL specimens only), is one component of a comprehensive MRSA colonization surveillance program. It is not intended to diagnose MRSA infection  nor to guide or monitor treatment for MRSA infections. Test performance is not FDA approved in patients less than 6 years old. Performed at [facility], [place] [address], [place], [place] [address]   Fungus culture, blood     Status: None (Preliminary result)   Collection Time: 11/17/20 12:13 PM   Specimen: BLOOD  Result Value Ref Range Status   Specimen Description BLOOD RIGHT ANTECUBITAL  Final   Special Requests   Final    BOTTLES DRAWN AEROBIC AND ANAEROBIC Blood Culture adequate volume   Culture   Final    NO GROWTH 5 DAYS Performed at [facility], [place] [address], [place], [place] [address]    Report Status PENDING  Incomplete    Lab Basic Metabolic Panel: Recent Labs  Lab 11/16/20 1133 11/16/20 1143 11/17/20 0356 11/17/20 0534 11/18/20 0315 11/18/20 0526 11/18/20 1236 11/18/20 1400 11/19/20 0224 11/20/20 0419 12/15/20 0457  NA 153*   < > 146*   < > 139   < > 139 138 136 133* 130*  K 3.8   < > 3.9   < > 3.9   < > 4.2 4.3 4.8 5.4* 7.2*  CL 110   < > 103  --  101  --   --  100 98 99 98  CO2  21*   < > 26  --  29  --   --  '28 26 23 '$ 20*  GLUCOSE 344*   < > 286*  --  188*  --   --  170* 143* 141* 222*  BUN 61*   < > 76*  --  97*  --   --  117* 121* 143* 158*  CREATININE 1.95*   < > 2.33*  --  2.85*  --   --  3.14* 3.41* 3.85* 4.59*  CALCIUM 8.0*   < > 7.6*  --  7.9*  --   --  7.8* 8.2* 8.1* 8.2*  MG 2.3  --  1.7  --  2.3  --   --   --   --  2.4 2.3  PHOS 7.0*  --  3.8  --  3.7  --   --   --   --  4.6 4.8*   < > = values in this interval not displayed.   Liver Function Tests: Recent Labs  Lab 11/16/20 2013 11/17/20 0356 11/18/20 0315 December 15, 2020 0457  AST 105* 109* 67* 86*  ALT 30 32 30 32  ALKPHOS 106 60 67 93  BILITOT 2.5* 2.9* 3.2* 4.2*  PROT 5.4* 5.3* 5.2* 5.3*  ALBUMIN 2.5* 2.9* 2.5* 1.5*   No results for input(s): LIPASE, AMYLASE in the last 168 hours. No results for input(s): AMMONIA in the last 168 hours. CBC: Recent Labs  Lab 11/18/20 0315 11/18/20 0526 11/18/20 1236 11/18/20 1400 11/19/20 0215 11/20/20 0419 12/15/2020 0457  WBC 33.3*  --   --  32.4* 37.4* 41.6* 50.9*  HGB 7.0*   < > 7.1* 6.7* 7.9* 7.7* 9.0*  HCT 23.5*   < > 21.0* 22.1* 25.3* 25.0* 29.8*  MCV 94.8  --   --  94.8 93.4 94.0 96.1  PLT 107*  --   --  89* 93* 105* 172   < > = values in this interval not displayed.   Cardiac Enzymes: No results for input(s): CKTOTAL, CKMB, CKMBINDEX, TROPONINI in the last 168 hours. Sepsis Labs: Recent Labs  Lab 11/16/20 [id number] 11/16/20 [id number] 11/16/20 1435 11/17/20 0356 11/17/20 0539  11/17/20 1130 11/18/20 0315 11/18/20 0316 11/18/20 1400 11/19/20 0215 11/20/20 0419 11/27/20 0457  PROCALCITON 2.95  --   --   --   --   --   --   --   --   --   --   --   WBC  --    < > 23.3*   < >  --   --    < >  --  32.4* 37.4* 41.6* 50.9*  LATICACIDVEN  --    < > >11.0*  --  6.7* 5.5*  --  2.2*  --   --   --   --    < > = values in this interval not displayed.    Procedures/Operations  Robotic distal gastrectomy 10/09/2020 Intubation x 2, central lines, art  line Picc EGD - Dr. Benson Norway 10/28/2020 Echo- 11/02/20 EEG 11/08/20 Upper GI 10/27/20 MR brain 11-05-20    Stark Klein 11/22/2020, 3:42 PM

## 2020-11-28 NOTE — Progress Notes (Signed)
This chaplain responded to PMT consult for spiritual care.  The Pt. husband-Douglas, son Patrick Jupiter and daughter in law, and grandson-Dallas are at the Pt. bedside.    After meeting the Pt. family, the chaplain moved to the waiting area with [facility].  The chaplain offered reflective listening and the space for [facility] to express his anticipatory grief. The chaplain offered and Loreli Slot agreed, in time the stories he was telling and the memories he has will help with the emotions he is feeling from the loss.    This chaplain offered F/U spiritual care as needed with the [facility] and [place].

## 2020-11-28 NOTE — Progress Notes (Signed)
Palliative:  HPI: 75 y.o. 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 She had procedure done 6/28.  Pathology was negative for malignancy but showed LGD with negative nodes. Patient experienced cardiac arrest early AM of 7/5 - had 6 minutes of CPR and was intubated.  Extubated 7/12. Was improving but then experienced respiratory distress 7/18 and had second cardiac arrest 7/20 following massive aspiration. Required prone ventilation following arrest and 3 vasopressors. Also now with worsening renal failure. PMT consulted to discuss [place].   I met today at Ms. Triplett' bedside with son Patrick Jupiter and his wife. They are tearful. Patrick Jupiter confirms that he has spoken with his father and they have decided to proceed with transition off ventilator to comfort care. They do not want her to suffer any more than she already has. They plan to have time today for family to visit and spend time with her and after everyone has had opportunity to spend their time we will proceed with extubation when they at their timing. We discussed use of medication to ensure her comfort now and throughout the process of coming off the ventilator. They are a family of great faith and would appreciate visit and prayer from chaplain. I encouraged them to reach out to their mother's personal pastor for visit as desired. They spent time sharing with me about Ms. Garin and the good woman and mother she has been for them. Patrick Jupiter shares that although he is sad he has found acceptance and knows that moving towards comfort care is the right decision for his mother and family. Patrick Jupiter shares that arrangements have already been pre-arranged and I counseled regarding the process of transition to funeral  home and there is no need for him to call funeral home unless he so desires.   All questions/concerns addressed. Emotional support provided. Discussed with RN and RT plan for comfort and use of medications to ensure comfort at time of extubation.   Exam: Unresponsive. Tolerating vent. No distress. Anasarca. Warm to touch.   Plan: - One way extubation to full comfort care today.  - Orders adjusted to reflect full comfort care and ensure comfort (already done by PCCM but reviewed).  - Anticipate hospital death.   25 min  Vinie Sill, NP Palliative Medicine Team Pager [phone] (Please see [email or web address] for schedule) Team Phone [phone]    Greater than 50%  of this time was spent counseling and coordinating care related to the above assessment and plan

## 2020-11-28 NOTE — Progress Notes (Signed)
   11/23/20 1230  Clinical Encounter Type  Visited With Patient and family together  Visit Type Patient actively dying  Referral From Nurse  Consult/Referral To Chaplain   Chaplain visited with the patient and husband who was at her bedside. I actively listened as Mr. Hevey spoke of being married for 36 years, how he has had a wonderful life because of his wife and stated he will miss her. I provided presence of grief support., ended my visit by reading Psalm 23 and prayer. This note was prepared by Jeanine Luz, M.Div..  For questions please contact by phone [phone].

## 2020-11-28 NOTE — Progress Notes (Signed)
Date and time results received: 2020/12/16 0900  Test: Potassium Critical Value: 7.2  Name of Provider Notified: Erskine Emery, MD  Orders Received? Or Actions Taken?: No orders written.  Consuella Lose, RN

## 2020-11-28 NOTE — Progress Notes (Signed)
PT Cancellation Note  Patient Details Name: [name] MRN: [id number] DOB: October 25, 1945   Cancelled Treatment:    Reason Eval/Treat Not Completed: Patient not medically ready (pt remains on vent at 100% fiO2, not appropriate for mobility and will sign off and await new order)   [name] B Warnell Rasnic 12/12/20, 6:37 AM [place] Pager: [phone] Office: [phone]

## 2020-11-28 NOTE — Progress Notes (Signed)
NAME:  [name], MRN:  [id number], DOB:  [birth year], LOS: 29 ADMISSION DATE:  10/21/2020, CONSULTATION DATE:  7/5 REFERRING MD:  Barry Dienes, CHIEF COMPLAINT:  Cardiac arrest   History of Present Illness:  [name] is a 75 y.o. 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 She had procedure done 6/28.  Pathology was negative for malignancy but showed LGD with negative nodes.   Early AM 7/5, she had wheezing so received a breathing treatment.  Shortly thereafter, she was found with emesis all over bed and floor.  She had gurgling sounds and altered LOC.  She ended up having loss of pulses with initial rhythm asystole.  She received '1mg'$  epinephrine before PEA for which she received a 2nd dose of epinephrine.  She then had ROSC after 6 minutes.  She was intubated during the code.   She was transferred to [facility] ICU where PCCM was asked to assist with vent management.  Pertinent  Medical History  OSA DM2 Hypertension CAD Allergic rhinitis  Significant Hospital Events: Including procedures, antibiotic start and stop dates in addition to other pertinent events   6/28 admit, robotic distal gastrectomy,  7/5 Six minute code (asystole then PEA), severe hypoxemic respiratory failure and shock 7/5 levaquin x1 7/5 ceftriaxone > 7/6 7/5 doxycycline > 7/6 7/6 cefepime >  7/6 EEG > generalized slowing suggestive of severe diffuse encephalopathy non-specific in etiology  7/6 TTE > LVEF 65-70%, grade II diastolic dysfunction, RSVP elevated 58.1 mmHg, trivial MR 7/12 extubated 7/13 To PCU, [facility] 7/18 PCCM called back for respiratory distress, moved to ICU for increased oxygen requirements. Temporarily was on bipap. 7/20 cardiac arrest following massive aspiration. PEA. Profoundly hypoxemic afterwards requiring prone ventilation.   Interim History /  Subjective:  Continues to deteriorate. Family at bedside.  Objective   Blood pressure (!) 86/52, pulse (!) 119, temperature 100.1 F (37.8 C), temperature source Oral, resp. rate (!) 43, height '4\' 11"'$  (1.499 m), weight 108.9 kg, SpO2 91 %.    Vent Mode: SIMV;PRVC;PSV FiO2 (%):  [50 %-100 %] 100 % Set Rate:  [15 bmp-34 bmp] 15 bmp Vt Set:  [300 mL] 300 mL PEEP:  [10 cmH20] 10 cmH20 Pressure Support:  [14 cmH20] 14 cmH20 Plateau Pressure:  [22 cmH20-27 cmH20] 27 cmH20   Intake/Output Summary (Last 24 hours) at 2020-11-23 [id number] Last data filed at 11-23-20 0300 Gross per 24 hour  Intake 509.77 ml  Output 80 ml  Net 429.77 ml    Filed Weights   11/18/20 0500 11/19/20 0452 11/23/20 0451  Weight: 103.6 kg 109.2 kg 108.9 kg    Examination: Agonally breathing on vent with versed wean O2 needs increased Increased anasarca Not following commands or withdrawing to pain Abdomen soft, hypoactive BS  WBC up to 50 CMP pending Minimal UoP Worsening hyperkalemia  Assessment & Plan:  Recurrent severe ARDS due to massive aspiration events- worsening from volume overload Worsening renal failure- ATN from arrest, not HD candidate Acute anemia- s/p 1 unit 7/23, keep an eye on for now, stable Likely OSA Anoxic brain injury from initial arrest- had been improving slowly Post partial gastrectomy- complicated by recurrent aspiration and gastroparesis Hypothyroidism DM2 with hyperglycemia Lactic acidemia post arrest- improved Muscular deconditioning- from prolonged ICU stay  Terminal decline today  Family coming to bedside  Will advocate we allow [name] to pass in peace without further mechanical ventilation,  pressors and focus on comfort in her final hours  Erskine Emery MD PCCM 31 minutes critical care time not including any separately billable procedures

## 2020-11-28 NOTE — Progress Notes (Signed)
Patient expired 1839hrs, confirmed by 2 RN, attending MD notified. Honor bridge and patient placement notified. Post mortem checklist completed.

## 2020-11-28 NOTE — Progress Notes (Signed)
Patient extubated for withdrawal of care per family request.  Patient tolerated well.

## 2020-11-28 DEATH — deceased

## 2020-12-07 ENCOUNTER — Ambulatory Visit: Payer: Medicare PPO | Admitting: Interventional Cardiology

## 2020-12-21 MED FILL — Medication: Qty: 1 | Status: AC

## 2020-12-29 DEATH — deceased

## 2022-02-19 IMAGING — RF DG NASO G TUBE PLC W/FL-NO RAD
1 series · 1 of 1 positions shown · IV contrast (omnipaque)
Comparison: None.

CLINICAL DATA: Post gastrectomy. Cortrak advancement under
fluoroscopic guidance.

EXAM:
DG NASO G TUBE INGUNN HARPA RONLOR/ARIAH SWITZER; ABDOMEN - 1 VIEW
CONTRAST:  10 mL Omnipaque 300 injected through the tube.
FLUOROSCOPY TIME:  Fluoroscopy Time:  42 seconds
Radiation Exposure Index (if provided by the fluoroscopic device):
Number of Acquired Spot Images: 0

[Series 1: cp_standard · 0.26mm/px · 1 of 1 slices shown]
[im 1/1]
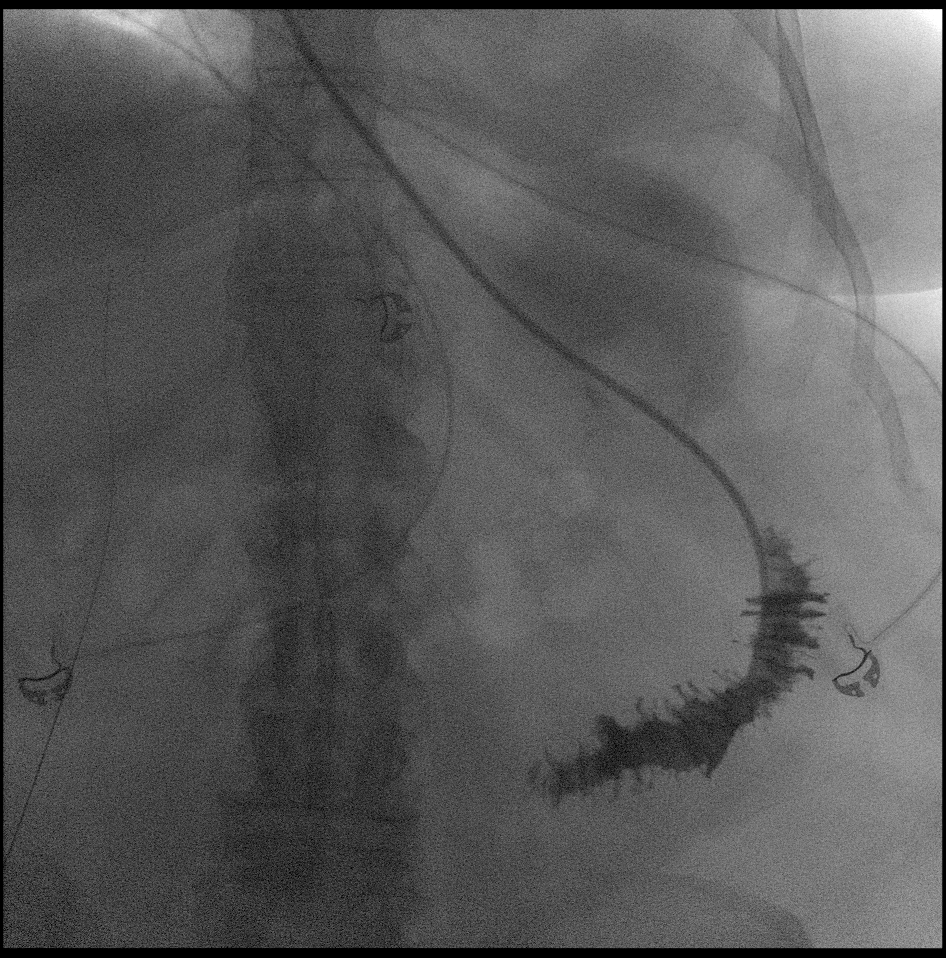

[1 of 1 positions shown; findings below may reference images not displayed]

FINDINGS: The Cortrak tube was advanced under fluoroscopic guidance through
the stomach and into the proximal small bowel. Contrast was injected
to verify placement.
IMPRESSION: Advancement of the Cortrak tube into the proximal small bowel.

## 2022-02-28 IMAGING — DX DG CHEST 1V PORT
1 series · 1 of 1 positions shown · non-contrast
Comparison: 11/16/2020

CLINICAL DATA: Check endotracheal tube, hypoxia

EXAM:
PORTABLE CHEST 1 VIEW

[chest]
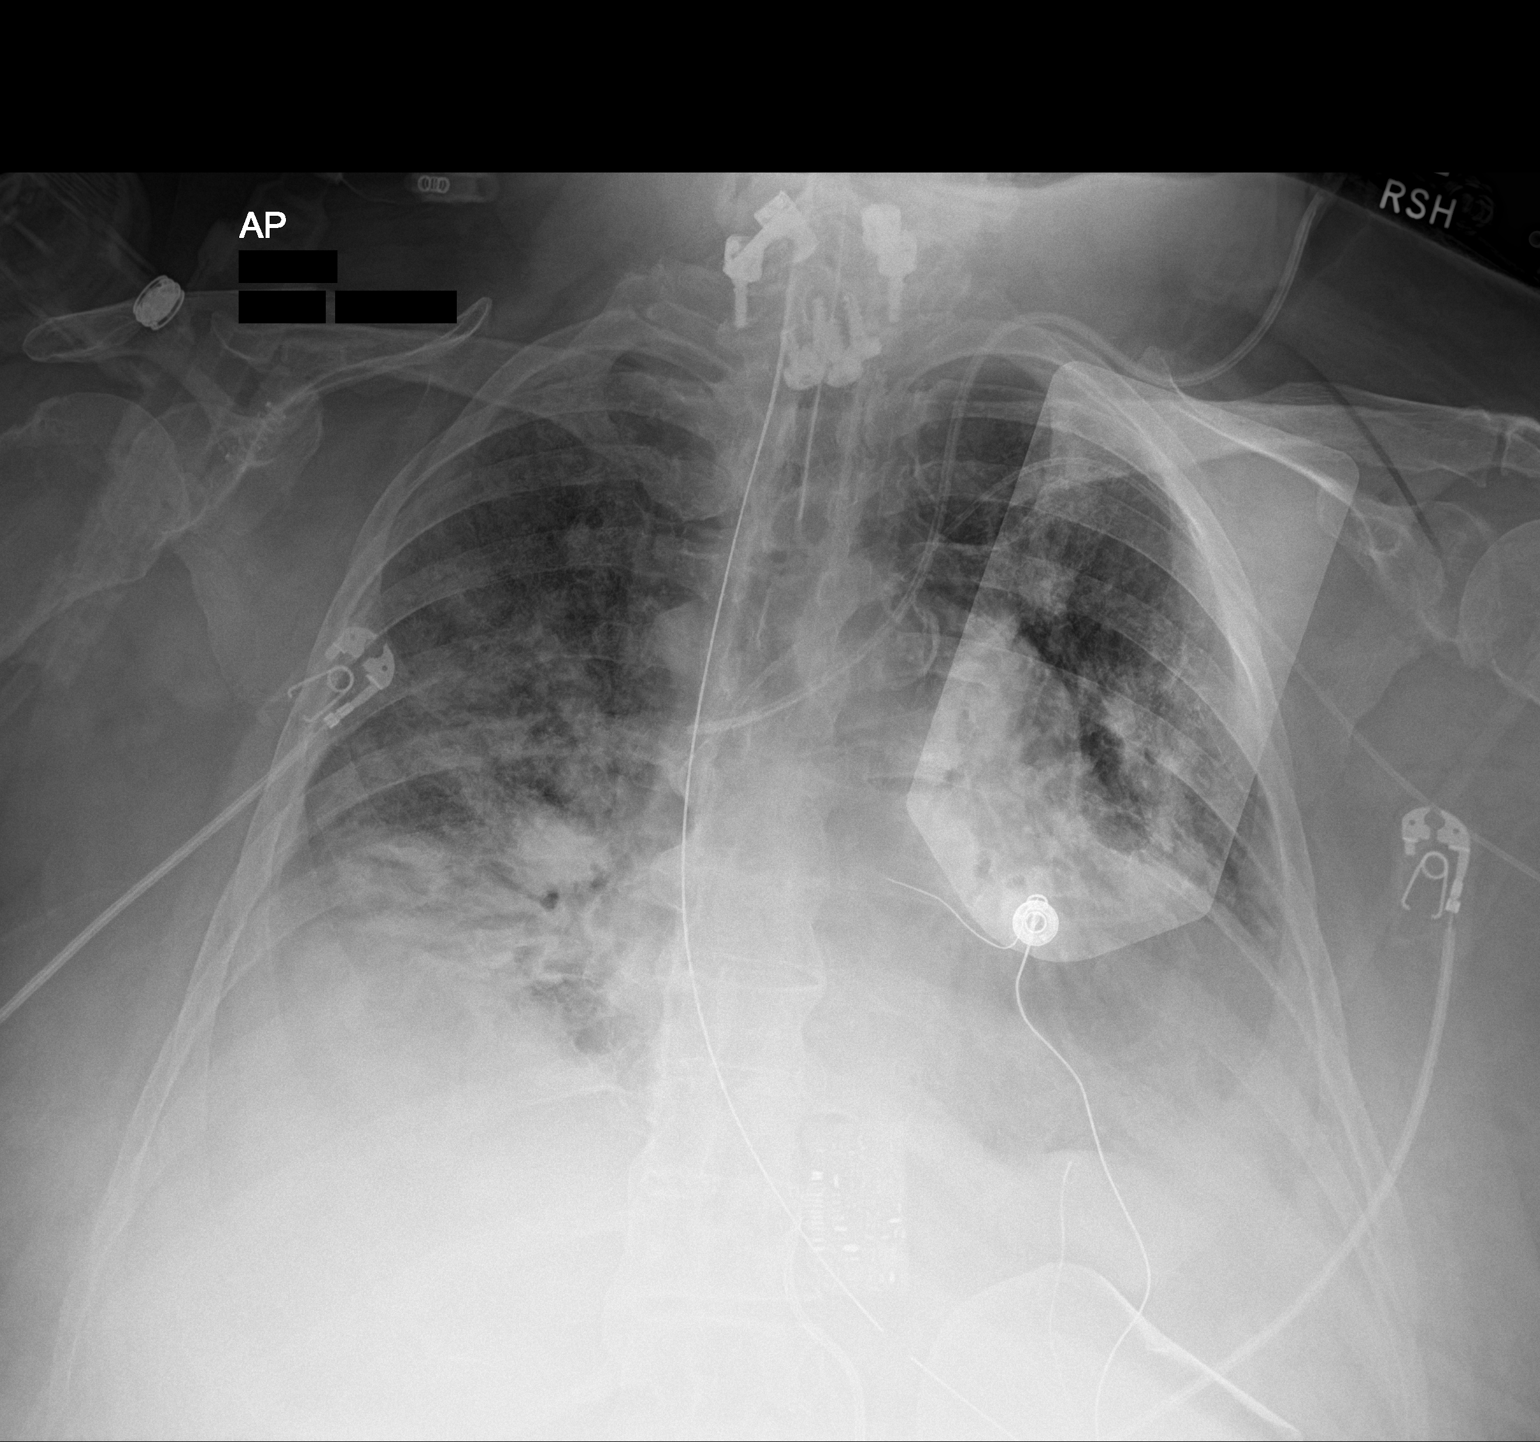

[1 of 1 positions shown; findings below may reference images not displayed]

FINDINGS: Cardiac shadow is stable. Left-sided PICC line, left jugular central
line and endotracheal tube are all noted in satisfactory position.
Gastric catheter extends to the stomach. Patchy airspace disease is
noted bilaterally but improved slightly in the interval from the
prior exam. No bony abnormality is noted.
IMPRESSION: Slight improved aeration when compare with the previous day.

## 2022-03-01 IMAGING — DX DG ABDOMEN 1V
1 series · 1 of 1 positions shown · non-contrast
Comparison: One-view abdomen 10/11/2020 one-view chest x-ray
11/18/2020

CLINICAL DATA: ARDS.  Ileus.

EXAM:
ABDOMEN - 1 VIEW

[abdomen]
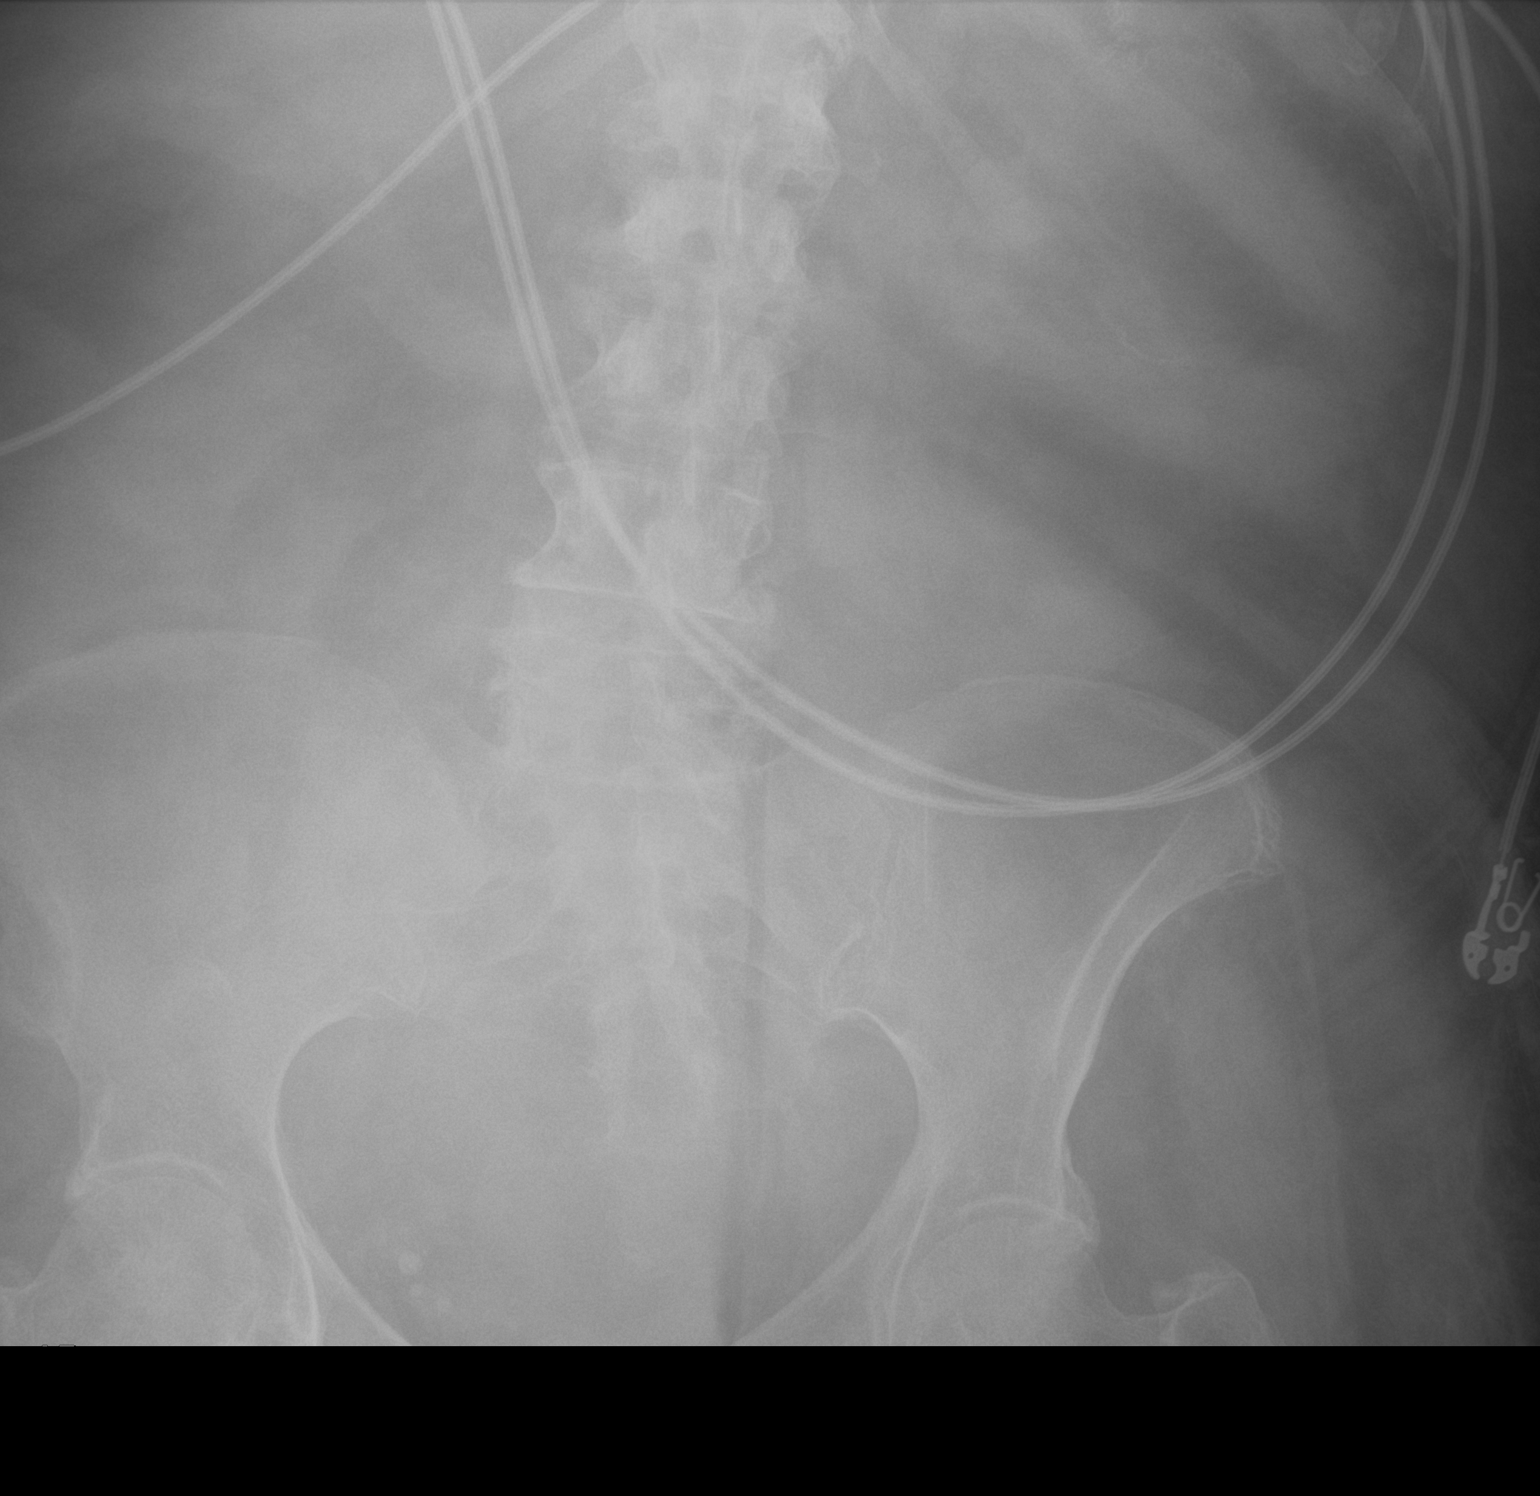

[1 of 1 positions shown; findings below may reference images not displayed]

FINDINGS: NG tube demonstrated on the chest x-ray is not imaged on this film.
Relatively gasless abdomen noted. No bowel distension present. No
evidence for free air.
IMPRESSION: Relatively gasless abdomen without evidence for obstruction or free
air.

## 2022-03-01 IMAGING — DX DG CHEST 1V PORT
1 series · 1 of 1 positions shown · non-contrast
Comparison: Portable exam 4427 hours compared to 1710 hours

CLINICAL DATA: ARDS, ileus

EXAM:
PORTABLE CHEST 1 VIEW

[chest]
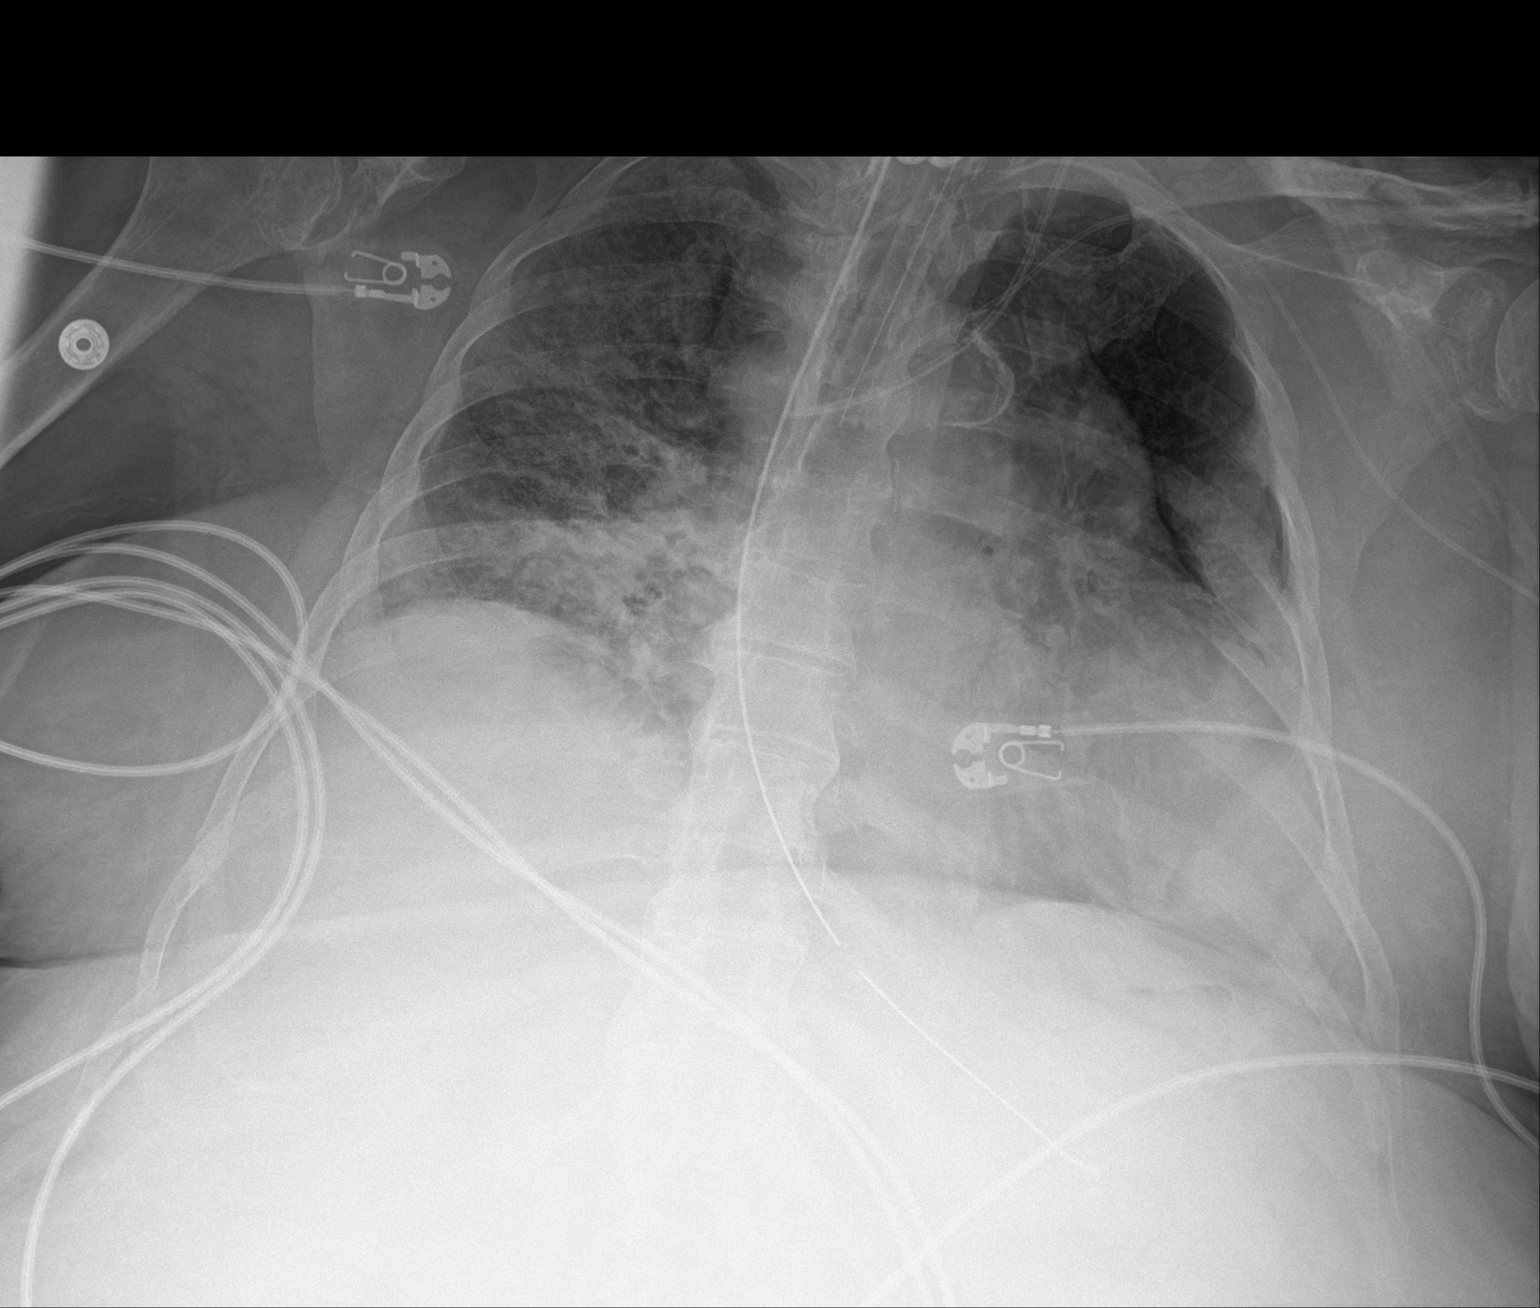

[1 of 1 positions shown; findings below may reference images not displayed]

FINDINGS: Tip of endotracheal tube projects 1.6 cm above carina.

Nasogastric tube extends into stomach.

LEFT jugular line with tip projecting over SVC.

Stable heart size and mediastinal contours.

Atherosclerotic calcification aorta.

BILATERAL pulmonary infiltrates unchanged.

No pleural effusion or pneumothorax.

Bones demineralized with prior cervicothoracic fusion.
IMPRESSION: Persistent pulmonary infiltrates.

Aortic Atherosclerosis ([id number]).
